# Patient Record
Sex: Male | Born: 1945
Health system: Southern US, Community
[De-identification: ages and names within clinical notes are randomized; demographics above are authoritative.]

## PROBLEM LIST (undated history)

## (undated) DIAGNOSIS — F329 Major depressive disorder, single episode, unspecified: Secondary | ICD-10-CM

## (undated) DIAGNOSIS — E1122 Type 2 diabetes mellitus with diabetic chronic kidney disease: Principal | ICD-10-CM

## (undated) DIAGNOSIS — J449 Chronic obstructive pulmonary disease, unspecified: Secondary | ICD-10-CM

## (undated) DIAGNOSIS — D631 Anemia in chronic kidney disease: Secondary | ICD-10-CM

## (undated) DIAGNOSIS — N400 Enlarged prostate without lower urinary tract symptoms: Secondary | ICD-10-CM

## (undated) DIAGNOSIS — E669 Obesity, unspecified: Secondary | ICD-10-CM

## (undated) DIAGNOSIS — I5021 Acute systolic (congestive) heart failure: Secondary | ICD-10-CM

## (undated) DIAGNOSIS — I739 Peripheral vascular disease, unspecified: Secondary | ICD-10-CM

## (undated) DIAGNOSIS — D649 Anemia, unspecified: Secondary | ICD-10-CM

## (undated) DIAGNOSIS — E1169 Type 2 diabetes mellitus with other specified complication: Secondary | ICD-10-CM

## (undated) DIAGNOSIS — E113213 Type 2 diabetes mellitus with mild nonproliferative diabetic retinopathy with macular edema, bilateral: Secondary | ICD-10-CM

## (undated) DIAGNOSIS — Z720 Tobacco use: Secondary | ICD-10-CM

## (undated) DIAGNOSIS — N521 Erectile dysfunction due to diseases classified elsewhere: Secondary | ICD-10-CM

## (undated) DIAGNOSIS — I10 Essential (primary) hypertension: Secondary | ICD-10-CM

## (undated) DIAGNOSIS — J189 Pneumonia, unspecified organism: Secondary | ICD-10-CM

## (undated) DIAGNOSIS — F1010 Alcohol abuse, uncomplicated: Secondary | ICD-10-CM

## (undated) DIAGNOSIS — D3502 Benign neoplasm of left adrenal gland: Secondary | ICD-10-CM

## (undated) DIAGNOSIS — D126 Benign neoplasm of colon, unspecified: Secondary | ICD-10-CM

## (undated) DIAGNOSIS — I82409 Acute embolism and thrombosis of unspecified deep veins of unspecified lower extremity: Secondary | ICD-10-CM

## (undated) DIAGNOSIS — N183 Chronic kidney disease, stage 3 (moderate): Principal | ICD-10-CM

## (undated) DIAGNOSIS — I5022 Chronic systolic (congestive) heart failure: Principal | ICD-10-CM

## (undated) DIAGNOSIS — J309 Allergic rhinitis, unspecified: Secondary | ICD-10-CM

## (undated) DIAGNOSIS — E785 Hyperlipidemia, unspecified: Secondary | ICD-10-CM

## (undated) DIAGNOSIS — N2581 Secondary hyperparathyroidism of renal origin: Secondary | ICD-10-CM

## (undated) DIAGNOSIS — I2510 Atherosclerotic heart disease of native coronary artery without angina pectoris: Secondary | ICD-10-CM

## (undated) DIAGNOSIS — N184 Chronic kidney disease, stage 4 (severe): Secondary | ICD-10-CM

## (undated) DIAGNOSIS — K5730 Diverticulosis of large intestine without perforation or abscess without bleeding: Secondary | ICD-10-CM

## (undated) DIAGNOSIS — I8290 Acute embolism and thrombosis of unspecified vein: Secondary | ICD-10-CM

## (undated) HISTORY — DX: Erectile dysfunction due to diseases classified elsewhere: N52.1

## (undated) HISTORY — DX: Type 2 diabetes mellitus with mild nonproliferative diabetic retinopathy with macular edema, bilateral: E11.3213

## (undated) HISTORY — DX: Type 2 diabetes mellitus with other specified complication: E11.69

## (undated) HISTORY — DX: Obesity, unspecified: E66.9

## (undated) HISTORY — DX: Alcohol abuse, uncomplicated: F10.10

## (undated) HISTORY — DX: Benign prostatic hyperplasia without lower urinary tract symptoms: N40.0

## (undated) HISTORY — DX: Diverticulosis of large intestine without perforation or abscess without bleeding: K57.30

## (undated) HISTORY — DX: Secondary hyperparathyroidism of renal origin: N25.81

## (undated) HISTORY — DX: Hyperlipidemia, unspecified: E78.5

## (undated) HISTORY — DX: Allergic rhinitis, unspecified: J30.9

## (undated) HISTORY — DX: Benign neoplasm of left adrenal gland: D35.02

## (undated) HISTORY — DX: Tobacco use: Z72.0

## (undated) HISTORY — DX: Chronic kidney disease, stage 4 (severe): N18.4

## (undated) HISTORY — DX: Peripheral vascular disease, unspecified: I73.9

## (undated) HISTORY — DX: Acute embolism and thrombosis of unspecified vein: I82.90

## (undated) HISTORY — DX: Type 2 diabetes mellitus with diabetic chronic kidney disease: E11.22

## (undated) HISTORY — DX: Chronic kidney disease, stage 3 (moderate): N18.3

## (undated) HISTORY — DX: Benign neoplasm of colon, unspecified: D12.6

## (undated) HISTORY — DX: Major depressive disorder, single episode, unspecified: F32.9

## (undated) HISTORY — DX: Chronic systolic (congestive) heart failure: I50.22

## (undated) HISTORY — DX: Anemia in chronic kidney disease: D63.1

## (undated) HISTORY — DX: Essential (primary) hypertension: I10

## (undated) HISTORY — DX: Anemia, unspecified: D64.9

---

## 1998-06-11 ENCOUNTER — Encounter: Admission: RE | Admit: 1998-06-11 | Discharge: 1998-06-11 | Payer: Self-pay | Admitting: Family Medicine

## 1998-07-16 ENCOUNTER — Encounter: Admission: RE | Admit: 1998-07-16 | Discharge: 1998-07-16 | Payer: Self-pay | Admitting: Family Medicine

## 1999-02-24 ENCOUNTER — Encounter: Admission: RE | Admit: 1999-02-24 | Discharge: 1999-02-24 | Payer: Self-pay | Admitting: Family Medicine

## 1999-03-18 ENCOUNTER — Encounter: Admission: RE | Admit: 1999-03-18 | Discharge: 1999-03-18 | Payer: Self-pay | Admitting: Family Medicine

## 1999-04-15 ENCOUNTER — Encounter: Admission: RE | Admit: 1999-04-15 | Discharge: 1999-04-15 | Payer: Self-pay | Admitting: Family Medicine

## 1999-05-19 ENCOUNTER — Encounter: Admission: RE | Admit: 1999-05-19 | Discharge: 1999-05-19 | Payer: Self-pay | Admitting: Family Medicine

## 1999-05-24 ENCOUNTER — Encounter: Admission: RE | Admit: 1999-05-24 | Discharge: 1999-05-24 | Payer: Self-pay | Admitting: Family Medicine

## 2000-04-24 ENCOUNTER — Encounter: Admission: RE | Admit: 2000-04-24 | Discharge: 2000-04-24 | Payer: Self-pay | Admitting: Family Medicine

## 2001-02-12 ENCOUNTER — Encounter: Admission: RE | Admit: 2001-02-12 | Discharge: 2001-02-12 | Payer: Self-pay | Admitting: Family Medicine

## 2001-04-02 ENCOUNTER — Encounter: Admission: RE | Admit: 2001-04-02 | Discharge: 2001-04-02 | Payer: Self-pay | Admitting: Family Medicine

## 2002-10-08 ENCOUNTER — Encounter: Payer: Self-pay | Admitting: Emergency Medicine

## 2002-10-08 ENCOUNTER — Emergency Department (HOSPITAL_COMMUNITY): Admission: EM | Admit: 2002-10-08 | Discharge: 2002-10-09 | Payer: Self-pay

## 2002-10-09 ENCOUNTER — Encounter: Payer: Self-pay | Admitting: Emergency Medicine

## 2002-10-22 ENCOUNTER — Encounter: Admission: RE | Admit: 2002-10-22 | Discharge: 2002-10-22 | Payer: Self-pay | Admitting: Family Medicine

## 2003-01-14 ENCOUNTER — Encounter: Admission: RE | Admit: 2003-01-14 | Discharge: 2003-01-14 | Payer: Self-pay | Admitting: *Deleted

## 2004-02-15 ENCOUNTER — Emergency Department (HOSPITAL_COMMUNITY): Admission: EM | Admit: 2004-02-15 | Discharge: 2004-02-15 | Payer: Self-pay | Admitting: Emergency Medicine

## 2004-02-17 ENCOUNTER — Emergency Department (HOSPITAL_COMMUNITY): Admission: EM | Admit: 2004-02-17 | Discharge: 2004-02-17 | Payer: Self-pay | Admitting: *Deleted

## 2004-03-15 ENCOUNTER — Emergency Department (HOSPITAL_COMMUNITY): Admission: EM | Admit: 2004-03-15 | Discharge: 2004-03-15 | Payer: Self-pay | Admitting: Family Medicine

## 2004-12-19 DIAGNOSIS — I82409 Acute embolism and thrombosis of unspecified deep veins of unspecified lower extremity: Secondary | ICD-10-CM

## 2004-12-19 HISTORY — DX: Acute embolism and thrombosis of unspecified deep veins of unspecified lower extremity: I82.409

## 2005-07-19 HISTORY — PX: CARDIAC CATHETERIZATION: SHX172

## 2005-07-26 ENCOUNTER — Inpatient Hospital Stay (HOSPITAL_COMMUNITY): Admission: EM | Admit: 2005-07-26 | Discharge: 2005-08-06 | Payer: Self-pay | Admitting: Emergency Medicine

## 2005-07-28 ENCOUNTER — Encounter (INDEPENDENT_AMBULATORY_CARE_PROVIDER_SITE_OTHER): Payer: Self-pay | Admitting: Cardiovascular Disease

## 2005-08-01 HISTORY — PX: CORONARY ARTERY BYPASS GRAFT: SHX141

## 2005-08-25 ENCOUNTER — Encounter (HOSPITAL_COMMUNITY): Admission: RE | Admit: 2005-08-25 | Discharge: 2005-11-23 | Payer: Self-pay | Admitting: Cardiovascular Disease

## 2005-08-26 ENCOUNTER — Encounter: Admission: RE | Admit: 2005-08-26 | Discharge: 2005-08-26 | Payer: Self-pay | Admitting: Cardiothoracic Surgery

## 2005-11-24 ENCOUNTER — Encounter (HOSPITAL_COMMUNITY): Admission: RE | Admit: 2005-11-24 | Discharge: 2005-12-16 | Payer: Self-pay | Admitting: Cardiovascular Disease

## 2005-12-16 ENCOUNTER — Encounter: Admission: RE | Admit: 2005-12-16 | Discharge: 2005-12-16 | Payer: Self-pay | Admitting: Cardiothoracic Surgery

## 2006-06-24 ENCOUNTER — Emergency Department (HOSPITAL_COMMUNITY): Admission: EM | Admit: 2006-06-24 | Discharge: 2006-06-24 | Payer: Self-pay | Admitting: Family Medicine

## 2006-06-26 ENCOUNTER — Emergency Department (HOSPITAL_COMMUNITY): Admission: EM | Admit: 2006-06-26 | Discharge: 2006-06-26 | Payer: Self-pay | Admitting: Emergency Medicine

## 2006-10-16 ENCOUNTER — Emergency Department (HOSPITAL_COMMUNITY): Admission: EM | Admit: 2006-10-16 | Discharge: 2006-10-16 | Payer: Self-pay | Admitting: Family Medicine

## 2007-02-15 DIAGNOSIS — F1010 Alcohol abuse, uncomplicated: Secondary | ICD-10-CM

## 2007-02-15 DIAGNOSIS — Z720 Tobacco use: Secondary | ICD-10-CM

## 2007-02-15 DIAGNOSIS — E66811 Obesity, class 1: Secondary | ICD-10-CM

## 2007-02-15 DIAGNOSIS — I10 Essential (primary) hypertension: Secondary | ICD-10-CM | POA: Insufficient documentation

## 2007-02-15 DIAGNOSIS — N183 Chronic kidney disease, stage 3 unspecified: Secondary | ICD-10-CM

## 2007-02-15 DIAGNOSIS — E1169 Type 2 diabetes mellitus with other specified complication: Secondary | ICD-10-CM

## 2007-02-15 DIAGNOSIS — E785 Hyperlipidemia, unspecified: Secondary | ICD-10-CM

## 2007-02-15 DIAGNOSIS — E1122 Type 2 diabetes mellitus with diabetic chronic kidney disease: Secondary | ICD-10-CM

## 2007-02-15 DIAGNOSIS — N521 Erectile dysfunction due to diseases classified elsewhere: Secondary | ICD-10-CM

## 2007-02-15 DIAGNOSIS — N185 Chronic kidney disease, stage 5: Secondary | ICD-10-CM

## 2007-02-15 DIAGNOSIS — I2510 Atherosclerotic heart disease of native coronary artery without angina pectoris: Secondary | ICD-10-CM

## 2007-02-15 DIAGNOSIS — E669 Obesity, unspecified: Secondary | ICD-10-CM

## 2007-02-15 DIAGNOSIS — I25719 Atherosclerosis of autologous vein coronary artery bypass graft(s) with unspecified angina pectoris: Secondary | ICD-10-CM

## 2007-02-15 HISTORY — DX: Erectile dysfunction due to diseases classified elsewhere: N52.1

## 2007-02-15 HISTORY — DX: Chronic kidney disease, stage 3 unspecified: N18.30

## 2007-02-15 HISTORY — DX: Atherosclerotic heart disease of native coronary artery without angina pectoris: I25.10

## 2007-02-15 HISTORY — DX: Hyperlipidemia, unspecified: E78.5

## 2007-02-15 HISTORY — DX: Obesity, unspecified: E66.9

## 2007-02-15 HISTORY — DX: Essential (primary) hypertension: I10

## 2007-02-15 HISTORY — DX: Type 2 diabetes mellitus with diabetic chronic kidney disease: E11.22

## 2007-02-15 HISTORY — DX: Type 2 diabetes mellitus with other specified complication: E11.69

## 2007-02-15 HISTORY — DX: Tobacco use: Z72.0

## 2007-02-15 HISTORY — DX: Obesity, class 1: E66.811

## 2007-02-15 HISTORY — DX: Alcohol abuse, uncomplicated: F10.10

## 2007-04-16 ENCOUNTER — Emergency Department (HOSPITAL_COMMUNITY): Admission: EM | Admit: 2007-04-16 | Discharge: 2007-04-16 | Payer: Self-pay | Admitting: Family Medicine

## 2007-08-21 ENCOUNTER — Emergency Department (HOSPITAL_COMMUNITY): Admission: EM | Admit: 2007-08-21 | Discharge: 2007-08-21 | Payer: Self-pay | Admitting: Emergency Medicine

## 2008-09-11 ENCOUNTER — Emergency Department (HOSPITAL_COMMUNITY): Admission: EM | Admit: 2008-09-11 | Discharge: 2008-09-11 | Payer: Self-pay | Admitting: Emergency Medicine

## 2009-05-25 ENCOUNTER — Inpatient Hospital Stay (HOSPITAL_COMMUNITY): Admission: EM | Admit: 2009-05-25 | Discharge: 2009-05-26 | Payer: Self-pay | Admitting: Emergency Medicine

## 2010-02-06 ENCOUNTER — Inpatient Hospital Stay (HOSPITAL_COMMUNITY): Admission: EM | Admit: 2010-02-06 | Discharge: 2010-02-10 | Payer: Self-pay | Admitting: Emergency Medicine

## 2010-09-05 ENCOUNTER — Emergency Department (HOSPITAL_COMMUNITY): Admission: EM | Admit: 2010-09-05 | Discharge: 2010-09-05 | Payer: Self-pay | Admitting: Emergency Medicine

## 2011-01-08 ENCOUNTER — Encounter: Payer: Self-pay | Admitting: Cardiothoracic Surgery

## 2011-01-09 ENCOUNTER — Emergency Department (HOSPITAL_COMMUNITY)
Admission: EM | Admit: 2011-01-09 | Discharge: 2011-01-09 | Payer: Self-pay | Source: Home / Self Care | Admitting: Emergency Medicine

## 2011-01-09 ENCOUNTER — Encounter: Payer: Self-pay | Admitting: Cardiothoracic Surgery

## 2011-01-11 LAB — DIFFERENTIAL
Basophils Absolute: 0 10*3/uL (ref 0.0–0.1)
Basophils Relative: 0 % (ref 0–1)
Eosinophils Absolute: 0 10*3/uL (ref 0.0–0.7)
Eosinophils Relative: 0 % (ref 0–5)
Lymphocytes Relative: 43 % (ref 12–46)
Lymphs Abs: 2.5 10*3/uL (ref 0.7–4.0)
Monocytes Absolute: 0.3 10*3/uL (ref 0.1–1.0)
Monocytes Relative: 6 % (ref 3–12)
Neutro Abs: 3 K/uL (ref 1.7–7.7)
Neutrophils Relative %: 51 % (ref 43–77)

## 2011-01-11 LAB — BASIC METABOLIC PANEL WITH GFR
BUN: 18 mg/dL (ref 6–23)
Calcium: 8.7 mg/dL (ref 8.4–10.5)
Chloride: 103 meq/L (ref 96–112)
GFR calc Af Amer: >60 mL/min (ref >60)
Glucose, Bld: 206 mg/dL — ABNORMAL HIGH (ref 70–99)
Potassium: 3.4 meq/L — ABNORMAL LOW (ref 3.5–5.1)
Sodium: 139 meq/L (ref 135–145)

## 2011-01-11 LAB — ETHANOL: Alcohol, Ethyl (B): 269 mg/dL — ABNORMAL HIGH (ref 0–10)

## 2011-01-11 LAB — CBC
HCT: 39.9 % (ref 39.0–52.0)
Hemoglobin: 13.3 g/dL (ref 13.0–17.0)
MCH: 29.1 pg (ref 26.0–34.0)
MCHC: 33.3 g/dL (ref 30.0–36.0)
MCV: 87.3 fL (ref 78.0–100.0)
Platelets: 276 K/uL (ref 150–400)
RBC: 4.57 MIL/uL (ref 4.22–5.81)
RDW: 13.9 % (ref 11.5–15.5)
WBC: 5.8 K/uL (ref 4.0–10.5)

## 2011-01-11 LAB — POCT CARDIAC MARKERS
CKMB, poc: 1.4 ng/mL (ref 1.0–8.0)
CKMB, poc: 1.5 ng/mL (ref 1.0–8.0)
CKMB, poc: <1 ng/mL — ABNORMAL LOW (ref 1.0–8.0)
Myoglobin, poc: 279 ng/mL (ref 12–200)
Myoglobin, poc: 173 ng/mL (ref 12–200)
Myoglobin, poc: 46.8 ng/mL (ref 12–200)
Troponin i, poc: <0.05 ng/mL (ref 0.00–0.09)
Troponin i, poc: <0.05 ng/mL (ref 0.00–0.09)
Troponin i, poc: <0.05 ng/mL (ref 0.00–0.09)

## 2011-01-11 LAB — BASIC METABOLIC PANEL
CO2: 24 mEq/L (ref 19–32)
Creatinine, Ser: 1.17 mg/dL (ref 0.4–1.5)
GFR calc non Af Amer: >60 mL/min (ref >60)

## 2011-02-09 ENCOUNTER — Inpatient Hospital Stay (HOSPITAL_COMMUNITY)
Admission: EM | Admit: 2011-02-09 | Discharge: 2011-02-21 | DRG: 193 | Disposition: A | Discharge status: Home / Self Care | Payer: Medicare HMO | Attending: Cardiovascular Disease | Admitting: Cardiovascular Disease

## 2011-02-09 ENCOUNTER — Emergency Department (HOSPITAL_COMMUNITY): Payer: Medicare HMO

## 2011-02-09 DIAGNOSIS — J189 Pneumonia, unspecified organism: Principal | ICD-10-CM | POA: Diagnosis present

## 2011-02-09 DIAGNOSIS — E119 Type 2 diabetes mellitus without complications: Secondary | ICD-10-CM | POA: Diagnosis present

## 2011-02-09 DIAGNOSIS — I2699 Other pulmonary embolism without acute cor pulmonale: Secondary | ICD-10-CM | POA: Diagnosis present

## 2011-02-09 DIAGNOSIS — I10 Essential (primary) hypertension: Secondary | ICD-10-CM | POA: Diagnosis present

## 2011-02-09 DIAGNOSIS — E669 Obesity, unspecified: Secondary | ICD-10-CM | POA: Diagnosis present

## 2011-02-09 DIAGNOSIS — E785 Hyperlipidemia, unspecified: Secondary | ICD-10-CM | POA: Diagnosis present

## 2011-02-09 DIAGNOSIS — I824Z9 Acute embolism and thrombosis of unspecified deep veins of unspecified distal lower extremity: Secondary | ICD-10-CM | POA: Diagnosis present

## 2011-02-09 LAB — COMPREHENSIVE METABOLIC PANEL
ALT: 12 U/L (ref 0–53)
Albumin: 2.7 g/dL — ABNORMAL LOW (ref 3.5–5.2)
Alkaline Phosphatase: 81 U/L (ref 39–117)
Calcium: 9.4 mg/dL (ref 8.4–10.5)
Chloride: 97 mEq/L (ref 96–112)
Creatinine, Ser: 1.07 mg/dL (ref 0.4–1.5)
GFR calc Af Amer: >60 mL/min (ref >60)
Potassium: 3.6 mEq/L (ref 3.5–5.1)
Sodium: 139 mEq/L (ref 135–145)

## 2011-02-09 LAB — URINALYSIS, ROUTINE W REFLEX MICROSCOPIC
Ketones, ur: 15 mg/dL — AB
Protein, ur: >300 mg/dL — AB
Specific Gravity, Urine: 1.025 (ref 1.005–1.030)
Urine Glucose, Fasting: NEGATIVE mg/dL

## 2011-02-09 LAB — CBC
MCHC: 34 g/dL (ref 30.0–36.0)
MCV: 88 fL (ref 78.0–100.0)
RDW: 13.9 % (ref 11.5–15.5)
WBC: 7.7 10*3/uL (ref 4.0–10.5)

## 2011-02-09 LAB — URINE MICROSCOPIC-ADD ON

## 2011-02-09 LAB — DIFFERENTIAL
Basophils Absolute: 0.1 10*3/uL (ref 0.0–0.1)
Basophils Relative: 1 % (ref 0–1)
Eosinophils Absolute: 0 10*3/uL (ref 0.0–0.7)
Lymphocytes Relative: 24 % (ref 12–46)
Neutro Abs: 5.1 10*3/uL (ref 1.7–7.7)

## 2011-02-10 LAB — GLUCOSE, CAPILLARY
Glucose-Capillary: 201 mg/dL — ABNORMAL HIGH (ref 70–99)
Glucose-Capillary: 145 mg/dL — ABNORMAL HIGH (ref 70–99)
Glucose-Capillary: 217 mg/dL — ABNORMAL HIGH (ref 70–99)

## 2011-02-11 LAB — BASIC METABOLIC PANEL
GFR calc non Af Amer: >60 mL/min (ref >60)
Potassium: 3.6 mEq/L (ref 3.5–5.1)
Sodium: 139 mEq/L (ref 135–145)

## 2011-02-11 LAB — GLUCOSE, CAPILLARY
Glucose-Capillary: 183 mg/dL — ABNORMAL HIGH (ref 70–99)
Glucose-Capillary: 208 mg/dL — ABNORMAL HIGH (ref 70–99)

## 2011-02-11 LAB — LIPID PANEL
Cholesterol: 140 mg/dL (ref 0–200)
HDL: 26 mg/dL — ABNORMAL LOW (ref >39)
LDL Cholesterol: 106 mg/dL — ABNORMAL HIGH (ref 0–99)
Triglycerides: 39 mg/dL (ref <150)

## 2011-02-11 LAB — URINE CULTURE

## 2011-02-12 ENCOUNTER — Inpatient Hospital Stay (HOSPITAL_COMMUNITY): Payer: Medicare HMO

## 2011-02-12 LAB — GLUCOSE, CAPILLARY: Glucose-Capillary: 183 mg/dL — ABNORMAL HIGH (ref 70–99)

## 2011-02-12 LAB — PROTIME-INR: INR: 0.95 (ref 0.00–1.49)

## 2011-02-12 MED ORDER — IOHEXOL 300 MG/ML  SOLN: 100.0000 mL | Freq: Once | INTRAMUSCULAR | Status: AC | PRN

## 2011-02-13 LAB — HEPARIN LEVEL (UNFRACTIONATED): Heparin Unfractionated: <0.1 IU/mL — ABNORMAL LOW (ref 0.30–0.70)

## 2011-02-13 LAB — CBC
HCT: 37.8 % — ABNORMAL LOW (ref 39.0–52.0)
Hemoglobin: 11.6 g/dL — ABNORMAL LOW (ref 13.0–17.0)
Hemoglobin: 12.5 g/dL — ABNORMAL LOW (ref 13.0–17.0)
MCH: 29.2 pg (ref 26.0–34.0)
MCHC: 33.1 g/dL (ref 30.0–36.0)
MCHC: 33.5 g/dL (ref 30.0–36.0)
Platelets: 508 10*3/uL — ABNORMAL HIGH (ref 150–400)
Platelets: 601 10*3/uL — ABNORMAL HIGH (ref 150–400)
RBC: 4.28 MIL/uL (ref 4.22–5.81)
RDW: 13.7 % (ref 11.5–15.5)
RDW: 13.8 % (ref 11.5–15.5)

## 2011-02-13 LAB — PROTIME-INR: Prothrombin Time: 12.9 seconds (ref 11.6–15.2)

## 2011-02-13 LAB — GLUCOSE, CAPILLARY
Glucose-Capillary: 127 mg/dL — ABNORMAL HIGH (ref 70–99)
Glucose-Capillary: 161 mg/dL — ABNORMAL HIGH (ref 70–99)
Glucose-Capillary: 252 mg/dL — ABNORMAL HIGH (ref 70–99)

## 2011-02-13 LAB — BRAIN NATRIURETIC PEPTIDE: Pro B Natriuretic peptide (BNP): 187 pg/mL — ABNORMAL HIGH (ref 0.0–100.0)

## 2011-02-14 DIAGNOSIS — I2699 Other pulmonary embolism without acute cor pulmonale: Secondary | ICD-10-CM

## 2011-02-14 LAB — HEPARIN LEVEL (UNFRACTIONATED)
Heparin Unfractionated: 0.2 IU/mL — ABNORMAL LOW (ref 0.30–0.70)
Heparin Unfractionated: 0.35 IU/mL (ref 0.30–0.70)
Heparin Unfractionated: 0.79 IU/mL — ABNORMAL HIGH (ref 0.30–0.70)

## 2011-02-14 LAB — GLUCOSE, CAPILLARY: Glucose-Capillary: 147 mg/dL — ABNORMAL HIGH (ref 70–99)

## 2011-02-15 LAB — PROTIME-INR
INR: 1.18 (ref 0.00–1.49)
Prothrombin Time: 15.2 seconds (ref 11.6–15.2)

## 2011-02-15 LAB — CBC
HCT: 38 % — ABNORMAL LOW (ref 39.0–52.0)
Hemoglobin: 12.7 g/dL — ABNORMAL LOW (ref 13.0–17.0)
MCHC: 33.4 g/dL (ref 30.0–36.0)

## 2011-02-15 LAB — GLUCOSE, CAPILLARY: Glucose-Capillary: 162 mg/dL — ABNORMAL HIGH (ref 70–99)

## 2011-02-16 LAB — CBC
HCT: 34.8 % — ABNORMAL LOW (ref 39.0–52.0)
Hemoglobin: 11.3 g/dL — ABNORMAL LOW (ref 13.0–17.0)
MCH: 28.4 pg (ref 26.0–34.0)
MCHC: 32.5 g/dL (ref 30.0–36.0)

## 2011-02-16 LAB — GLUCOSE, CAPILLARY
Glucose-Capillary: 120 mg/dL — ABNORMAL HIGH (ref 70–99)
Glucose-Capillary: 178 mg/dL — ABNORMAL HIGH (ref 70–99)

## 2011-02-17 LAB — GLUCOSE, CAPILLARY
Glucose-Capillary: 149 mg/dL — ABNORMAL HIGH (ref 70–99)
Glucose-Capillary: 202 mg/dL — ABNORMAL HIGH (ref 70–99)

## 2011-02-17 LAB — HEPARIN LEVEL (UNFRACTIONATED): Heparin Unfractionated: 0.64 IU/mL (ref 0.30–0.70)

## 2011-02-17 LAB — BASIC METABOLIC PANEL
Calcium: 9.3 mg/dL (ref 8.4–10.5)
Creatinine, Ser: 1.35 mg/dL (ref 0.4–1.5)
GFR calc Af Amer: >60 mL/min (ref >60)

## 2011-02-17 LAB — CBC
Platelets: 481 10*3/uL — ABNORMAL HIGH (ref 150–400)
RBC: 3.93 MIL/uL — ABNORMAL LOW (ref 4.22–5.81)
WBC: 5.9 10*3/uL (ref 4.0–10.5)

## 2011-02-17 LAB — PROTIME-INR
INR: 1.69 — ABNORMAL HIGH (ref 0.00–1.49)
Prothrombin Time: 20.1 seconds — ABNORMAL HIGH (ref 11.6–15.2)

## 2011-02-18 LAB — CBC
HCT: 31.7 % — ABNORMAL LOW (ref 39.0–52.0)
Hemoglobin: 10.3 g/dL — ABNORMAL LOW (ref 13.0–17.0)
RDW: 14 % (ref 11.5–15.5)
WBC: 5.5 10*3/uL (ref 4.0–10.5)

## 2011-02-18 LAB — GLUCOSE, CAPILLARY: Glucose-Capillary: 148 mg/dL — ABNORMAL HIGH (ref 70–99)

## 2011-02-18 LAB — PROTIME-INR
INR: 1.69 — ABNORMAL HIGH (ref 0.00–1.49)
Prothrombin Time: 20.1 seconds — ABNORMAL HIGH (ref 11.6–15.2)

## 2011-02-19 LAB — BASIC METABOLIC PANEL
BUN: 19 mg/dL (ref 6–23)
Calcium: 9 mg/dL (ref 8.4–10.5)
Creatinine, Ser: 1.32 mg/dL (ref 0.4–1.5)
GFR calc non Af Amer: 54 mL/min — ABNORMAL LOW (ref >60)
Glucose, Bld: 171 mg/dL — ABNORMAL HIGH (ref 70–99)
Sodium: 137 mEq/L (ref 135–145)

## 2011-02-19 LAB — CBC
Hemoglobin: 10.6 g/dL — ABNORMAL LOW (ref 13.0–17.0)
MCHC: 32.6 g/dL (ref 30.0–36.0)
Platelets: 407 10*3/uL — ABNORMAL HIGH (ref 150–400)
RDW: 14 % (ref 11.5–15.5)

## 2011-02-19 LAB — HEPARIN LEVEL (UNFRACTIONATED)
Heparin Unfractionated: 0.77 IU/mL — ABNORMAL HIGH (ref 0.30–0.70)
Heparin Unfractionated: 0.56 IU/mL (ref 0.30–0.70)

## 2011-02-19 LAB — GLUCOSE, CAPILLARY: Glucose-Capillary: 108 mg/dL — ABNORMAL HIGH (ref 70–99)

## 2011-02-19 LAB — PROTIME-INR: Prothrombin Time: 20.3 seconds — ABNORMAL HIGH (ref 11.6–15.2)

## 2011-02-20 LAB — GLUCOSE, CAPILLARY: Glucose-Capillary: 122 mg/dL — ABNORMAL HIGH (ref 70–99)

## 2011-02-20 LAB — HEPARIN LEVEL (UNFRACTIONATED): Heparin Unfractionated: 0.65 IU/mL (ref 0.30–0.70)

## 2011-02-20 LAB — CBC
HCT: 31.8 % — ABNORMAL LOW (ref 39.0–52.0)
Hemoglobin: 10.5 g/dL — ABNORMAL LOW (ref 13.0–17.0)
MCHC: 33 g/dL (ref 30.0–36.0)
MCV: 87.6 fL (ref 78.0–100.0)
RDW: 14.1 % (ref 11.5–15.5)
WBC: 5.2 10*3/uL (ref 4.0–10.5)

## 2011-02-21 LAB — COMPREHENSIVE METABOLIC PANEL
ALT: 38 U/L (ref 0–53)
AST: 30 U/L (ref 0–37)
Albumin: 2.9 g/dL — ABNORMAL LOW (ref 3.5–5.2)
Alkaline Phosphatase: 78 U/L (ref 39–117)
BUN: 14 mg/dL (ref 6–23)
CO2: 30 mEq/L (ref 19–32)
Calcium: 9.1 mg/dL (ref 8.4–10.5)
Chloride: 101 mEq/L (ref 96–112)
Creatinine, Ser: 1.17 mg/dL (ref 0.4–1.5)
GFR calc Af Amer: >60 mL/min (ref >60)
GFR calc non Af Amer: >60 mL/min (ref >60)
Glucose, Bld: 111 mg/dL — ABNORMAL HIGH (ref 70–99)
Potassium: 4.7 mEq/L (ref 3.5–5.1)
Sodium: 136 mEq/L (ref 135–145)
Total Bilirubin: 0.2 mg/dL — ABNORMAL LOW (ref 0.3–1.2)
Total Protein: 7 g/dL (ref 6.0–8.3)

## 2011-02-21 LAB — PROTIME-INR
INR: 2.73 — ABNORMAL HIGH (ref 0.00–1.49)
Prothrombin Time: 29 seconds — ABNORMAL HIGH (ref 11.6–15.2)

## 2011-02-21 LAB — CBC
HCT: 32.8 % — ABNORMAL LOW (ref 39.0–52.0)
Hemoglobin: 10.7 g/dL — ABNORMAL LOW (ref 13.0–17.0)
MCH: 28.5 pg (ref 26.0–34.0)
MCHC: 32.6 g/dL (ref 30.0–36.0)
MCV: 87.2 fL (ref 78.0–100.0)
Platelets: 372 10*3/uL (ref 150–400)
RBC: 3.76 MIL/uL — ABNORMAL LOW (ref 4.22–5.81)
RDW: 14.1 % (ref 11.5–15.5)
WBC: 5.8 10*3/uL (ref 4.0–10.5)

## 2011-02-21 LAB — HEPARIN LEVEL (UNFRACTIONATED): Heparin Unfractionated: 0.94 IU/mL — ABNORMAL HIGH (ref 0.30–0.70)

## 2011-02-21 LAB — GLUCOSE, CAPILLARY: Glucose-Capillary: 106 mg/dL — ABNORMAL HIGH (ref 70–99)

## 2011-03-03 LAB — CBC
HCT: 41.4 % (ref 39.0–52.0)
MCV: 89.1 fL (ref 78.0–100.0)
RBC: 4.65 MIL/uL (ref 4.22–5.81)
RDW: 15.1 % (ref 11.5–15.5)
WBC: 5.8 10*3/uL (ref 4.0–10.5)

## 2011-03-03 LAB — COMPREHENSIVE METABOLIC PANEL
ALT: 15 U/L (ref 0–53)
Albumin: 3.7 g/dL (ref 3.5–5.2)
Alkaline Phosphatase: 116 U/L (ref 39–117)
BUN: 14 mg/dL (ref 6–23)
Chloride: 101 mEq/L (ref 96–112)
Glucose, Bld: 302 mg/dL — ABNORMAL HIGH (ref 70–99)
Potassium: 3.7 mEq/L (ref 3.5–5.1)
Sodium: 135 mEq/L (ref 135–145)
Total Bilirubin: 0.6 mg/dL (ref 0.3–1.2)
Total Protein: 7.4 g/dL (ref 6.0–8.3)

## 2011-03-03 LAB — POCT CARDIAC MARKERS
Myoglobin, poc: 49.8 ng/mL (ref 12–200)
Myoglobin, poc: 68.4 ng/mL (ref 12–200)

## 2011-03-03 LAB — URINALYSIS, ROUTINE W REFLEX MICROSCOPIC
Bilirubin Urine: NEGATIVE
Ketones, ur: NEGATIVE mg/dL
Leukocytes, UA: NEGATIVE
Nitrite: NEGATIVE
Protein, ur: >300 mg/dL — AB
Urobilinogen, UA: 0.2 mg/dL (ref 0.0–1.0)

## 2011-03-03 LAB — GLUCOSE, CAPILLARY
Glucose-Capillary: 290 mg/dL — ABNORMAL HIGH (ref 70–99)
Glucose-Capillary: 297 mg/dL — ABNORMAL HIGH (ref 70–99)
Glucose-Capillary: 333 mg/dL — ABNORMAL HIGH (ref 70–99)
Glucose-Capillary: 367 mg/dL — ABNORMAL HIGH (ref 70–99)

## 2011-03-03 LAB — DIFFERENTIAL
Basophils Absolute: 0 10*3/uL (ref 0.0–0.1)
Basophils Relative: 1 % (ref 0–1)
Eosinophils Absolute: 0 10*3/uL (ref 0.0–0.7)
Monocytes Absolute: 0.1 10*3/uL (ref 0.1–1.0)
Monocytes Relative: 2 % — ABNORMAL LOW (ref 3–12)

## 2011-03-09 LAB — BASIC METABOLIC PANEL
BUN: 11 mg/dL (ref 6–23)
CO2: 27 mEq/L (ref 19–32)
CO2: 27 mEq/L (ref 19–32)
Chloride: 95 mEq/L — ABNORMAL LOW (ref 96–112)
Chloride: 98 mEq/L (ref 96–112)
GFR calc Af Amer: >60 mL/min (ref >60)
GFR calc non Af Amer: >60 mL/min (ref >60)
Glucose, Bld: 233 mg/dL — ABNORMAL HIGH (ref 70–99)
Potassium: 3.3 mEq/L — ABNORMAL LOW (ref 3.5–5.1)
Sodium: 133 mEq/L — ABNORMAL LOW (ref 135–145)

## 2011-03-09 LAB — GLUCOSE, CAPILLARY
Glucose-Capillary: 128 mg/dL — ABNORMAL HIGH (ref 70–99)
Glucose-Capillary: 155 mg/dL — ABNORMAL HIGH (ref 70–99)
Glucose-Capillary: 173 mg/dL — ABNORMAL HIGH (ref 70–99)
Glucose-Capillary: 198 mg/dL — ABNORMAL HIGH (ref 70–99)
Glucose-Capillary: 235 mg/dL — ABNORMAL HIGH (ref 70–99)
Glucose-Capillary: 237 mg/dL — ABNORMAL HIGH (ref 70–99)

## 2011-03-09 LAB — CBC
HCT: 35.8 % — ABNORMAL LOW (ref 39.0–52.0)
Hemoglobin: 13.9 g/dL (ref 13.0–17.0)
MCHC: 34.7 g/dL (ref 30.0–36.0)
MCHC: 35.1 g/dL (ref 30.0–36.0)
MCV: 91.4 fL (ref 78.0–100.0)
MCV: 92.6 fL (ref 78.0–100.0)
Platelets: 178 10*3/uL (ref 150–400)
RBC: 4.34 MIL/uL (ref 4.22–5.81)
RDW: 12.6 % (ref 11.5–15.5)

## 2011-03-09 LAB — DIFFERENTIAL
Basophils Absolute: 0 10*3/uL (ref 0.0–0.1)
Basophils Relative: 0 % (ref 0–1)
Basophils Relative: 1 % (ref 0–1)
Eosinophils Absolute: 0 10*3/uL (ref 0.0–0.7)
Eosinophils Absolute: 0 10*3/uL (ref 0.0–0.7)
Eosinophils Relative: 0 % (ref 0–5)
Eosinophils Relative: 0 % (ref 0–5)
Monocytes Absolute: 0.7 10*3/uL (ref 0.1–1.0)
Monocytes Relative: 11 % (ref 3–12)

## 2011-03-09 LAB — URINE CULTURE: Colony Count: NO GROWTH

## 2011-03-09 LAB — COMPREHENSIVE METABOLIC PANEL
ALT: 15 U/L (ref 0–53)
Albumin: 2.9 g/dL — ABNORMAL LOW (ref 3.5–5.2)
Alkaline Phosphatase: 80 U/L (ref 39–117)
Calcium: 8.2 mg/dL — ABNORMAL LOW (ref 8.4–10.5)
Potassium: 4.1 mEq/L (ref 3.5–5.1)
Sodium: 129 mEq/L — ABNORMAL LOW (ref 135–145)
Total Protein: 6.2 g/dL (ref 6.0–8.3)

## 2011-03-09 LAB — CULTURE, BLOOD (ROUTINE X 2)
Culture: NO GROWTH
Culture: NO GROWTH

## 2011-03-09 LAB — URINALYSIS, ROUTINE W REFLEX MICROSCOPIC
Glucose, UA: 250 mg/dL — AB
Ketones, ur: NEGATIVE mg/dL
Protein, ur: >300 mg/dL — AB

## 2011-03-09 LAB — URINE MICROSCOPIC-ADD ON

## 2011-03-09 LAB — HEMOGLOBIN A1C: Hgb A1c MFr Bld: 9.9 % — ABNORMAL HIGH (ref 4.6–6.1)

## 2011-03-12 NOTE — Discharge Summary (Signed)
Kevin Castillo, Kevin Castillo             ACCOUNT NO.:  [id number]  MEDICAL RECORD NO.:  [id number]           PATIENT TYPE:  I  LOCATION:  2008                         FACILITY:  [facility]  PHYSICIAN:  Birdie Riddle, M.D.  DATE OF BIRTH:  1946/06/25  DATE OF ADMISSION:  02/09/2011 DATE OF DISCHARGE:  02/21/2011                              DISCHARGE SUMMARY   FINAL DIAGNOSES: 1. Right lower lung pneumonia. 2. Hypertension. 3. Diabetes mellitus type 2. 4. Bilateral deep venous thrombosis. 5. Obesity. 6. Erectile dysfunction. 7. Chronic obstructive pulmonary disease.  DISCHARGE MEDICATIONS: 1. Albuterol 90 mcg inhaler 2 puffs 4 times daily as needed. 2. Doxazosin 8 mg tablets one at bedtime. 3. Moxifloxacin 400 mg one daily for 5 days. 4. Nicotine 21 mg per 24-hour patch, apply daily for 2-4 weeks. 5. Potassium chloride 10 mEq daily. 6. Warfarin 7.5 mg tablet one daily. 7. Amlodipine 10 mg half a tablet daily. 8. Acetaminophen 325 mg 2 tablets every 6 hours as needed. 9. Cyclobenzaprine 10 mg 1 tablet at bedtime. 10.Olmesartan/hydrochlorothiazide 300/25 mg 1 tablet daily. 11.Metformin XL 1000 mg one twice daily. 12.Nitroglycerin sublingual 0.4 mg tablet one under the tongue every 5     minutes x3 doses as needed. 13.Toprol-XL 50 mg one daily. 14.Tramadol 50 mg one in the evening as needed. 15.Vytorin 10/10 mg 1 tablet daily.  The patient to discontinue taking aspirin.  DISCHARGE DIET:  Low-sodium, heart-healthy diet and carbohydrate- modified diet.  DISCHARGE ACTIVITY:  The patient to increase activity gradually as tolerated.  Followup by Dr. Dixie Dials in 4 days.  The patient to also get INR checked at that time in the office.  CONDITION ON DISCHARGE:  Improved.  HISTORY:  This 65 year old black male presented with abdominal discomfort, nausea without fever, also had right-sided chest pain and cough.  His past medical history positive for diabetes,  hypertension, dyslipidemia, obesity, erectile dysfunction, and COPD.  PHYSICAL EXAMINATION:  VITAL SIGNS:  Pulse 90, respirations 19, blood pressure 170/67, temperature 98.2. HEENT:  The patient is normocephalic, atraumatic with brown eyes. Conjunctivae pink.  Sclerae are muddy. NECK:  No JVD. LUNGS:  Right lower lung crackles.  Negative wheezing. HEART:  Normal S1 and S2. ABDOMEN:  Soft, distended, and nontender. EXTREMITIES:  Trace edema, positive clubbing. CNS:  The patient moves all four extremities.  He is alert and oriented x3.  LABORATORY DATA:  Normal hemoglobin, hematocrit, WBC count, and platelet count.  Normal INR of 0.95.  Subsequent INR on February 21, 2011, was 2.73. Electrolytes normal.  Glucose slightly high at 117, BUN 14, creatinine 1.07, albumin low at 2.7.  Rest of the liver function test unremarkable. B-natriuretic peptide slightly high at 187.  Cholesterol total 140, triglycerides 39, LDL cholesterol slightly high at 106, HDL cholesterol low at 26.  Urinalysis showed protein more than 300 with granular cast and cloudy appearance.  Urine culture showed no growth.  EKG showed sinus rhythm with septal infarct.  Bilateral lower extremity venous duplex evaluation showed acute deep venous thrombosis involving the right lower extremity.  CT of the abdomen without contrast showed right lower lobe pneumonia with small effusion.  CT angiography showed mild subacute to chronic right-sided pulmonary emboli, moderate right pleural effusion, 13-mm pulmonary nodule without evidence of lymphadenopathy.  Ultrasound of the abdomen showed normal gallbladder with a left renal cyst.  HOSPITAL COURSE:  The patient was admitted to telemetry unit.  He was started on IV antibiotic.  He had abdomen evaluation with the CT and ultrasound of the abdomen.  On February 14, 2011, he underwent lower extremity duplex evaluation that showed right leg DVT.  He was started on IV heparin and  Coumadin.  His condition improved gradually over the next 4-5 days.  His Norvasc dose was decreased by 50% to resume Cardura for his prostate symptoms of urinary hesitancy.  On February 21, 2011, he was discharged home in satisfactory condition with followup by me in 4-5 days with Dr. Dixie Dials.     Birdie Riddle, M.D.     ASK/MEDQ  D:  03/09/2011  T:  03/10/2011  Job:  [id number]  Electronically Signed by Dixie Dials M.D. on 03/12/2011 09:22:03 PM

## 2011-03-28 LAB — GLUCOSE, CAPILLARY
Glucose-Capillary: 102 mg/dL — ABNORMAL HIGH (ref 70–99)
Glucose-Capillary: 139 mg/dL — ABNORMAL HIGH (ref 70–99)
Glucose-Capillary: 170 mg/dL — ABNORMAL HIGH (ref 70–99)
Glucose-Capillary: 199 mg/dL — ABNORMAL HIGH (ref 70–99)

## 2011-03-28 LAB — URINALYSIS, ROUTINE W REFLEX MICROSCOPIC
Bilirubin Urine: NEGATIVE
Glucose, UA: NEGATIVE mg/dL
Hgb urine dipstick: NEGATIVE
Ketones, ur: NEGATIVE mg/dL
Leukocytes, UA: NEGATIVE
Nitrite: NEGATIVE
Protein, ur: 100 mg/dL — AB
Specific Gravity, Urine: 1.025 (ref 1.005–1.030)
Urobilinogen, UA: 1 mg/dL (ref 0.0–1.0)
pH: 5.5 (ref 5.0–8.0)

## 2011-03-28 LAB — POCT CARDIAC MARKERS
CKMB, poc: <1 ng/mL — ABNORMAL LOW (ref 1.0–8.0)
CKMB, poc: <1 ng/mL — ABNORMAL LOW (ref 1.0–8.0)
Myoglobin, poc: 42.3 ng/mL (ref 12–200)
Myoglobin, poc: 75.9 ng/mL (ref 12–200)
Troponin i, poc: <0.05 ng/mL (ref 0.00–0.09)
Troponin i, poc: <0.05 ng/mL (ref 0.00–0.09)

## 2011-03-28 LAB — DIFFERENTIAL
Basophils Absolute: 0 K/uL (ref 0.0–0.1)
Basophils Relative: 0 % (ref 0–1)
Eosinophils Absolute: 0 10*3/uL (ref 0.0–0.7)
Eosinophils Relative: 1 % (ref 0–5)
Lymphocytes Relative: 41 % (ref 12–46)
Lymphs Abs: 2 K/uL (ref 0.7–4.0)
Monocytes Absolute: 0.2 10*3/uL (ref 0.1–1.0)
Monocytes Relative: 5 % (ref 3–12)
Neutro Abs: 2.6 K/uL (ref 1.7–7.7)
Neutrophils Relative %: 53 % (ref 43–77)

## 2011-03-28 LAB — CBC
HCT: 38 % — ABNORMAL LOW (ref 39.0–52.0)
HCT: 40.5 % (ref 39.0–52.0)
Hemoglobin: 13.1 g/dL (ref 13.0–17.0)
MCHC: 33.2 g/dL (ref 30.0–36.0)
MCHC: 34.4 g/dL (ref 30.0–36.0)
MCV: 91.5 fL (ref 78.0–100.0)
MCV: 91.6 fL (ref 78.0–100.0)
Platelets: 236 K/uL (ref 150–400)
Platelets: 259 10*3/uL (ref 150–400)
RBC: 4.15 MIL/uL — ABNORMAL LOW (ref 4.22–5.81)
RDW: 13.3 % (ref 11.5–15.5)
RDW: 13.3 % (ref 11.5–15.5)
WBC: 4.8 K/uL (ref 4.0–10.5)

## 2011-03-28 LAB — PROTIME-INR
INR: 1 (ref 0.00–1.49)
Prothrombin Time: 13.2 s (ref 11.6–15.2)

## 2011-03-28 LAB — CK TOTAL AND CKMB (NOT AT ARMC)
Relative Index: INVALID (ref 0.0–2.5)
Total CK: 84 U/L (ref 7–232)

## 2011-03-28 LAB — BASIC METABOLIC PANEL WITH GFR
BUN: 9 mg/dL (ref 6–23)
Calcium: 9.4 mg/dL (ref 8.4–10.5)
Creatinine, Ser: 0.87 mg/dL (ref 0.4–1.5)
GFR calc non Af Amer: >60 mL/min (ref >60)
Glucose, Bld: 135 mg/dL — ABNORMAL HIGH (ref 70–99)
Potassium: 4.4 meq/L (ref 3.5–5.1)

## 2011-03-28 LAB — BASIC METABOLIC PANEL
CO2: 30 mEq/L (ref 19–32)
Chloride: 108 mEq/L (ref 96–112)
GFR calc Af Amer: >60 mL/min (ref >60)
Sodium: 143 mEq/L (ref 135–145)

## 2011-03-28 LAB — HEPARIN LEVEL (UNFRACTIONATED)
Heparin Unfractionated: 0.47 IU/mL (ref 0.30–0.70)
Heparin Unfractionated: 0.65 [IU]/mL (ref 0.30–0.70)

## 2011-03-28 LAB — URINE MICROSCOPIC-ADD ON

## 2011-03-28 LAB — HEMOGLOBIN A1C
Hgb A1c MFr Bld: 8.2 % — ABNORMAL HIGH (ref 4.6–6.1)
Mean Plasma Glucose: 189 mg/dL

## 2011-03-28 LAB — APTT: aPTT: 30 s (ref 24–37)

## 2011-03-28 LAB — CARDIAC PANEL(CRET KIN+CKTOT+MB+TROPI)
Relative Index: INVALID (ref 0.0–2.5)
Troponin I: 0.02 ng/mL (ref 0.00–0.06)

## 2011-05-06 NOTE — Op Note (Signed)
NAME:  Kevin Castillo, Kevin Castillo NO.:  [id number]   MEDICAL RECORD NO.:  [id number]          PATIENT TYPE:  INP   LOCATION:  2312                         FACILITY:  [facility]   PHYSICIAN:  Ivin Poot, M.D.  DATE OF BIRTH:  August 24, 1946   DATE OF PROCEDURE:  08/01/2005  DATE OF DISCHARGE:                                 OPERATIVE REPORT   OPERATION:  Coronary artery bypass grafting x4 (left internal mammary artery  to left anterior descending coronary artery, saphenous vein graft to obtuse  marginal, sequential saphenous vein graft to right coronary artery and  posterior descending).   PREOPERATIVE DIAGNOSIS:  Class IV unstable angina with severe three vessel  coronary disease.   SURGEON:  Ivin Poot, M.D.   ASSISTANT:  Suzzanne Cloud, PA-C   ANESTHESIA:  General by Finis Bud, M.D.   INDICATIONS:  The patient is a 65 year old black gentleman who presented  with unstable angina and had positive cardiac enzymes.  Cardiac  catheterization demonstrated ejection fraction of 45% with anterior and  apical hypokinesia and severe three-vessel coronary disease.  He was felt to  be a candidate for surgical revascularization.  Prior to surgery, I examined  the patient in his hospital room on several occasions and reviewed the  results the cardiac catheterization with the patient.  I discussed the  indications and expected benefits of coronary bypass surgery for treatment  of his coronary disease.  I reviewed the alternatives to surgical therapy as  well with the patient.  I discussed with the patient the major aspects of  the planned procedure including the choice of conduit for grafts, the  location of the surgical incisions, the use of general anesthesia and  cardiopulmonary bypass, and the expected postoperative hospital recovery  period.  I discussed with the patient the risks to him of coronary bypass  surgery including risks of MI, CVA, bleeding, infection, and  death.  He  understood due to his heavy smoking history, his diabetes, his obesity, and  his diabetic pattern of the disease, that he was an increased risk over an  average patient for this operation.  He agreed to proceed with the operation  as planned under what I felt was an informed consent.   OPERATIVE FINDINGS:  The mammary artery was a good vessel with excellent  flow.  The saphenous vein was harvested endoscopically and was an adequate  vessel.  The diagonal branches were too small to graft.  The LAD had diffuse  disease with plaque in the wall.  The patient was given the aprotinin  protocol for this operation.  The patient was not given any blood  transfusion products.  The total pump time was 125 minutes with crossclamp  time of 80 minutes.   PROCEDURE:  The patient was brought to operating room and placed supine on  the operating table, and general anesthesia was induced under invasive  hemodynamic monitoring.  The chest, abdomen and legs were prepped with  Betadine and draped as a sterile field.  A sternal incision was made as the  saphenous vein was harvested endoscopically from the  right leg.  The left  internal mammary artery was harvested as a pedicle graft from its origin at  the subclavian vessels and was a good vessel with excellent flow.  Heparin was administered systemically and the ACT was documented as being  therapeutic.  The sternal retractor was placed.  The pericardium was opened  and suspended.  Pursestrings were placed in the ascending aorta and right  atrium, and the patient was cannulated and placed on bypass.  The coronaries  were identified for grafting, and the mammary artery and vein grafts were  prepared for the distal anastomoses.  Cardioplegia cannulas were placed both  in the aorta and into the coronary sinus for antegrade and retrograde cold  blood cardioplegia.  The patient was cooled to 32 degrees.  The aortic  crossclamp was applied.  Cold blood  cardioplegia 800 mL was delivered in  split doses between the antegrade aortic and retrograde coronary sinus  catheters.  There was good cardioplegic arrest and septal temperature  dropped to less than 15 degrees.  Topical iced saline was used to augment  myocardial preservation and a pericardial insulator pad was used protect the  left phrenic nerve.   The distal coronary anastomoses were then performed.  The first distal  anastomosis consisted of a sequential vein graft from the distal right  coronary artery to the posterior descending.  The mid-right coronary was a  1.7 mm vessel with a proximal 80% stenosis.  A side-to-side anastomosis with  a reversed saphenous vein was sewn using running 7-0 Prolene.  The second  distal anastomosis was the continuation of the sequential vein graft to the  posterior descending.  This had an area of long 80% stenosis.  The end of  the vein was sewn end-to-side with running 7-0 Prolene.  There was good flow  through the graft.  Cardioplegia was redosed.  The third distal anastomosis  was of the obtuse marginal.  This is a small 1.5 mm vessel with a proximal 80% stenosis.  A reversed  saphenous vein was sewn end-to-side with running 7-0 Prolene with good flow  through graft.  Cardioplegia was redosed.  The fourth distal anastomosis was  to the distal third of the LAD.  This is a 1.6 mm vessel with a proximal 90%  stenosis.  The left internal mammary artery pedicle was brought through an  opening created in the left lateral pericardium.  It was brought down onto  the LAD and sewn end-to-side with running 8-0 Prolene.  There was excellent flow through the anastomosis after briefly releasing the  pedicle bulldog clamp on the mammary pedicle.  The bulldog was replaced and  the pedicle was secured to the epicardium with two 6-0 Prolene sutures.  Cardioplegia was redosed.   While the crossclamp was still in place, two proximal vein anastomoses were placed  in the ascending aorta using a 4.0 mm punch and running 6-0 Prolene.  Prior to tying down the final proximal anastomosis, air was vented from the  coronaries and the left side of the heart using a dose of retrograde  retrograde warm blood cardioplegia and the usual de-airing maneuvers.  The  final anastomosis on the aorta was tied and the crossclamp was removed.   The heart resumed a spontaneous rhythm.  The cardioplegia catheters were  removed.  Air was aspirated from the vein grafts with a 27-gauge needle.  The bypass grafts were opened and each had good flow.  Hemostasis was documented at the proximal  and distal anastomoses.  The  patient was rewarmed to 37 degrees.  Temporary pacing wires were applied.  The lungs were re-expanded and the ventilator was resumed.  The patient was  weaned from bypass without inotropic support.  He was in a sinus rhythm.  Blood pressure and cardiac output were stable.  The cannulas were removed.  Protamine was administered.  There was no adverse reaction to the protamine.  The mediastinum was irrigated with warm antibiotic irrigation.  The leg  incision was irrigated and closed in a standard fashion.  The pericardium  was loosely reapproximated.  Two mediastinal and a left pleural chest tube  were placed and brought out through separate incisions.  The sternum was  closed with interrupted steel wire.  The pectoralis fascia was closed using  a running #1 Vicryl.  The subcutaneous and skin layers were closed with a  running Vicryl.  The patient remained hemodynamically stable and transferred  to the intensive care unit in stable but critical condition.      Ivin Poot, M.D.  Electronically Signed    PV/MEDQ  D:  08/01/2005  T:  08/02/2005  Job:  [id number]   cc:   [facility]

## 2011-05-06 NOTE — Discharge Summary (Signed)
NAMEDONZELL, Kevin Castillo             ACCOUNT NO.:  [id number]   MEDICAL RECORD NO.:  [id number]          PATIENT TYPE:  INP   LOCATION:  [id number]                         FACILITY:  [facility]   PHYSICIAN:  Birdie Riddle, M.D.  DATE OF BIRTH:  09-04-1946   DATE OF ADMISSION:  05/25/2009  DATE OF DISCHARGE:  05/26/2009                               DISCHARGE SUMMARY   REFERRING PHYSICIAN:  Nolene Ebbs, MD   FINAL DIAGNOSES:  1. Chest pain.  2. Coronary atherosclerosis of native vessels.  3. Aortocoronary bypass.  4. Diabetes mellitus type 2.  5. Hypertension.  6. Tobacco use disorder.  7. Obesity.  8. Organic impotence.  9. History of noncompliance.   DISCHARGE MEDICATIONS:  1. Nitroglycerin 0.4 mg 1 sublingual every 5 minutes x3 as needed for      chest pain.  2. Aspirin 81 mg 1 daily.  3. Metoprolol 100 mg 1 twice daily.  4. Metformin 1000 mg once or twice daily as directed.  5. Pravachol 80 mg 1 daily.  6. Avalide 300/25 mg 1 daily.   FOLLOWUP:  Follow up with Dr. Dixie Dials in 1-2 weeks.  The patient to  call [phone] for appointment.   DISCHARGE DIET:  Low-sodium, heart-healthy diet.   DISCHARGE ACTIVITY:  The patient to increase activity slowly and stop  any activity that causes chest pain, shortness of breath, dizziness,  sweating or excessive weakness.   HISTORY:  This 65 year old black male presented with a burning type  retrosternal exertional nonradiating chest pain without nausea or  vomiting, but with some sweating spell.  The patient had a history of  coronary artery disease and bypass graft surgery, but had noncompliance  issues.  He had no chest pain after one sublingual nitroglycerin in the  emergency room.  His past medical history is positive for diabetes,  hypertension, smoking, elevated cholesterol level, and obesity.  He had  a bypass surgery done in August 2006, by Dr. Prescott Gum with LIMA to LAD  and saphenous vein graft to OM, RCA and PDA.   PHYSICAL EXAMINATION:  VITAL SIGNS:  Temperature 97, pulse 62,  respirations 20, blood pressure 157/90, and oxygen saturation 99% on  room air.  GENERAL:  The patient is well-built and well-nourished black male.  ENT:  The patient is normocephalic and atraumatic with pupil equally  reacting to light.  Extraocular movement intact.  Eyes brown.  Conjunctivae pink.  Sclerae are nonicteric.  NECK:  Supple.  No JVD.  LUNGS:  Clear to auscultation.  HEART:  Normal S1 and S2.  ABDOMEN:  Soft and nontender, but distended.  EXTREMITIES:  No edema, cyanosis, or clubbing.  NEUROLOGIC:  The patient was alert and oriented x3, and cranial nerves  were grossly intact.   LABORATORY DATA:  Normal hemoglobin/hematocrit, WBC count, and platelet  count.  Normal electrolytes, BUN, and creatinine.  Normal CK-MB and  troponin I.   Nuclear stress test consistent with small infarct.  Normal wall motion  and contractility.  LVEF of 52% and moderate region of peri-infarct  ischemia of the mid and apical segment of the anterior  wall.   HOSPITAL COURSE:  The patient was admitted to telemetry unit and  myocardial infarction was ruled out.  He underwent nuclear stress test  that showed a very little peri-infarct ischemia.  Hence on May 26, 2009,  the patient was discharged home in satisfactory condition with followup  by me in 1 week.      Birdie Riddle, M.D.  Electronically Signed     ASK/MEDQ  D:  06/24/2009  T:  06/24/2009  Job:  [id number]

## 2011-05-06 NOTE — Consult Note (Signed)
NAME:  Kevin Castillo, Kevin Castillo NO.:  [id number]   MEDICAL RECORD NO.:  [id number]          PATIENT TYPE:  INP   LOCATION:  3303                         FACILITY:  [facility]   PHYSICIAN:  Ivin Poot, M.D.  DATE OF BIRTH:  1946-06-06   DATE OF CONSULTATION:  07/29/2005  DATE OF DISCHARGE:                                   CONSULTATION   REFERRING PHYSICIAN:  Richard A. Rollene Fare, M.D.   PRIMARY CARE PHYSICIAN:  Nolene Ebbs, M.D.   REASON FOR CONSULTATION:  Severe three-vessel coronary artery disease with  unstable angina and subendocardial MI.   CHIEF COMPLAINT:  Chest pain.   HISTORY OF PRESENT ILLNESS:  I was asked to evaluate this 65 year old,  African-American gentleman for potential surgical coronary revascularization  for recently diagnosed severe coronary disease.  He has no past history of  coronary disease or prior cardiac evaluation.  He was asleep when he  developed severe substernal crushing chest pain on August 8.  He had  preceding similar symptoms, but not as severe in the previous 2-3 weeks.  The pain waxed and waned and persisted until morning when he presented to  the emergency department where he was found to have positive cardiac enzymes  and he was admitted to the hospital.  He is placed on heparin and  nitroglycerin and within 24 hours underwent diagnostic cardiac  catheterization by Dr. Claiborne Billings.  His cardiac enzymes were minimally elevated.  He was found to have severe three-vessel coronary disease in a diabetic  pattern with 90% stenosis of the LAD, 95% stenosis of the circumflex and 80%  stenosis of the right coronary.  Ejection fraction was well-preserved and  left ventricular end-diastolic pressure was 16 mmHg.  A follow-up 2D  echocardiogram was performed which showed no evidence of valvular heart  disease.  He was felt to be a candidate for surgical revascularization.  He  is currently stable on Lovenox.   PAST MEDICAL HISTORY:  1.  Type  2 diabetes mellitus.  2.  Hypertension.  3.  Dyslipidemia.  4.  Chronic bronchitis from chronic active smoking.  5.  Morbid obesity.   MEDICATIONS:  1.  Diovan/HCT 160/12.5 daily.  2.  Ecotrin 81 mg q.d.  3.  Glucophage 100 mg b.i.d.  4.  Norvasc 1 mg day.  5.  Viagra 50 mg p.r.n.   ALLERGIES:  No known drug allergies.   SOCIAL HISTORY:  The patient is retired.  He drinks one bottle of liquor  weekly.  He smokes one pack of cigarettes a day.  He is married.   FAMILY HISTORY:  Positive for COPD and stroke.   REVIEW OF SYSTEMS:  CONSTITUTIONAL:  Negative for fever, weight loss.  ENT:  Negative for change in vision or active dental complaints or difficulty  swallowing.  THORACIC:  Negative for thoracic trauma or abnormal chest x-  ray.  CARDIAC:  Review is positive for his recent unstable angina and MI.  ENDOCRINE:  Review is positive for diabetes, negative thyroid disease.  HEMATOLOGIC:  Negative bleeding disorder or prior blood transfusion.  SURGICAL:  Negative for prior  surgical history.  NEUROLOGIC:  Negative for  stroke or seizure.   PHYSICAL EXAMINATION:  VITAL SIGNS:  The patient is 5 feet 7 inches, weight  235 pounds.  Blood pressure is 132/74, pulse is 80 sinus rhythm,  respirations 20, room air saturation 96%.  GENERAL:  A middle-aged black male known stress in the hospital recovery  from his MI following cardiac catheterization.  HEENT:  Exam is normocephalic.  Pharynx clear.  Dentition adequate.  NECK:  Without JVD, mass or bruit.  LYMPHS:  No palpable adenopathy.  LUNGS:  Scattered rhonchi.  No deformity.  CARDIAC:  Regular rhythm without S3 gallop or murmur.  ABDOMEN:  Obese, soft, nontender without organomegaly.  EXTREMITIES:  1+ pedal edema.  No deformity or tenderness.  VASCULAR:  Exam is with 2+ bilateral radial and femoral pulses, nonpalpable  pedal pulses.  He has some venous stasis changes of venous insufficiency in  the pretibial areas bilaterally.   NEUROLOGIC:  Alert and oriented without focal deficit.   LABORATORY DATA:  I reviewed the coronary arteriogram with Dr. Claiborne Billings and  gave his impression of severe three-vessel disease and preserved LV  function. His chest x-ray shows some evidence of chronic bronchitis.   His pO2 on room air is 65 with pCO2 of 45 and PFTs are pending.  His  creatinine post catheterization is 1.0.   IMPRESSION AND PLAN:  The patient has severe two-vessel disease and would  benefit surgical revascularization.  I discussed the procedure in detail  with the patient and wife and have scheduled surgery for the afternoon of  August 14.  Prior to surgery, he will continue with pulmonary toilet and  diabetic control.  Thank for the consultation.       PV/MEDQ  D:  07/29/2005  T:  07/29/2005  Job:  [id number]   cc:   CVTS office   [facility]

## 2011-05-06 NOTE — H&P (Signed)
NAME:  Kevin Castillo, Kevin Castillo             ACCOUNT NO.:  [id number]   MEDICAL RECORD NO.:  [id number]          PATIENT TYPE:  INP   LOCATION:  3303                         FACILITY:  [facility]   PHYSICIAN:  Nolene Ebbs, M.D.    DATE OF BIRTH:  31-Jul-1946   DATE OF ADMISSION:  07/26/2005  DATE OF DISCHARGE:                                HISTORY & PHYSICAL   PRESENTATION:  Central chest pain.   HISTORY OF PRESENT COMPLAINTS:  Kevin Castillo is a 65 year old African-  American gentleman with past medical history significant for type 2 diabetes  mellitus, hypertension, tobacco abuse, and noncompliance with medication  therapy.  He presented to the emergency room of [facility] with  complaints of central chest pain that started in the early hours of the  morning at about 2 a.m.  He awoke with a crushing, burning pain in the  substernal region which lasted for about one and a half hours but he did not  take any medications or go to the emergency room.  He reported that he had  similar episodes about a month ago which went away after he took aspirin.  This time the pain returned at about 8:00 in the morning and again was  unrelenting.  The pain was radiating to his left shoulder and to his left  lower back.  He therefore proceeded to the emergency room to be evaluated.  He reported breaking out in a sweat.  He denied any shortness of breath or  palpitations.  He did have one episode of nausea, but no vomiting.  He  denied any dizziness or blurring of vision or weakness of extremities.  At  the emergency room he was noted to have an elevated troponin and I was  therefore invited to admit him to the hospital.   PAST MEDICAL HISTORY:  1.  Hypertension.  2.  Type 2 diabetes mellitus.  3.  Dyslipidemia.  4.  Chronic bronchitis related to tobacco abuse.  5.  Morbid obesity.  6.  Erectile dysfunction.   PAST SURGICAL HISTORY:  Nonsignificant.   MEDICATIONS:  1.  Diovan/HCT 160/12.5  daily.  2.  Ecotrin 81 mg daily.  3.  Fortamet or Glucophage 1000 mg b.i.d.  4.  Norvasc 1 mg daily.  5.  He uses Viagra 100 mg half to one daily p.r.n.   He has no history of drug allergies.   FAMILY AND SOCIAL HISTORY:  His father is deceased from emphysema at the age  of 59.  His mother died of stroke at the age of 30.  He has one brother and  five sisters who are alive and well.  He has nine children in good health.  He smokes one pack of cigarettes per day for many years.  He drinks one  bottle of liquor per week.  He denied any use of illicit drugs.   REVIEW OF SYSTEMS:  GENERAL:  He denies any fatigue, weakness, fevers, or  chills.  CNS:  He denies any dizziness, blurring of vision, weakness of  extremities, or slurring of speech.  CARDIOVASCULAR:  As above.  RESPIRATORY:  He denies any cough, sputum production, hemoptysis, or  wheezing.  GI:  He has some nausea, but no abdominal pain, vomiting,  diarrhea, or constipation.  GU:  He denies any dysuria, frequency, or  hematuria.  MUSCULOSKELETAL:  He denies any joint pains or joint swelling.   PHYSICAL EXAMINATION:  GENERAL:  He is lying comfortably in bed right now.  He is not in any pain.  He is not in any acute respiratory distress.  VITAL SIGNS:  Blood pressure 125/65, heart rate of 78, regular, respiratory  rate 16, O2 saturations on 2 L of nasal cannula is 99%.  HEENT:  He has protruding eyeballs.  He is not pale.  He is not icteric.  He  is not cyanotic.  NECK:  Supple with no elevated JVD.  No cervical lymphadenopathy.  CHEST:  Good air entry bilaterally with no rales, rhonchi, or wheezes.  Heart sounds 1 and 2 are heard with no murmurs or gallops.  ABDOMEN:  Obese, soft, nontender.  No palpable masses.  Bowel sounds are  present.  EXTREMITIES:  No edema.  No ulcers.  No calf tenderness.  Peripheral pulses  are present bilaterally with no bruit.  CNS:  Alert and oriented x3 with no focal neurological deficits.    LABORATORY DATA:  His EKG shows normal sinus rhythm with nonspecific T-wave  changes in the inferolateral leads.  There are no acute ST-T wave changes.  Sodium 136, potassium 3.2, chloride 100, bicarbonate 29, BUN 15, creatinine  1, glucose 290.  White count is 5.4, hemoglobin 14.3, hematocrit 41.9, and  platelet count of 355.  His liver function test is essentially normal.  His  cardiac markers shows point of care troponin of 0.43 to 0.36 to 0.33.   ASSESSMENT:  Kevin Castillo is a 65 year old African-American gentleman  with multiple cardiac risks admitted via the emergency room at [facility] with central chest pain suggestive of unstable angina.  He seemed  to have developed a non ST elevation myocardial infarction and he will be  admitted to stepdown unit.   PLAN OF CARE:  Admit to stepdown unit.  He will be placed on a 2 g sodium,  carbohydrate modified diet.  He will be on bed rest.  Vital signs q.4h.  CBGs q.a.c. and h.s.  Serial troponins and EKG will be obtained.  He will be  started on intravenous fluids half normal saline with 20 mEq potassium at 75  mL/hour, intravenous nitroglycerin to titrate to pain control, intravenous  heparin for pharmacy protocol, sliding scale NovoLog insulin for moderate to  sensitive scale.  His home medication will be continued.  He will also be  possibly started on metoprolol 12.5 mg b.i.d.  A cardiology consult will be  requested in the morning for further evaluation of his coronaries.   ADMITTING DIAGNOSES:  1.  Unstable angina with non ST elevation myocardial infarction.  2.  Type 2 diabetes mellitus, uncontrolled.  His plan of care has been      discussed with him and his wife who is present at his bedside and all      his questions have been answered.      Nolene Ebbs, M.D.  Electronically Signed     EA/MEDQ  D:  07/27/2005  T:  07/27/2005  Job:  [id number]

## 2011-05-06 NOTE — Cardiovascular Report (Signed)
NAME:  Kevin Castillo, LEWI NO.:  [id number]   MEDICAL RECORD NO.:  [id number]          PATIENT TYPE:  INP   LOCATION:  3303                         FACILITY:  [facility]   PHYSICIAN:  Shelva Majestic, M.D.     DATE OF BIRTH:  06-14-1946   DATE OF PROCEDURE:  07/27/2005  DATE OF DISCHARGE:                              CARDIAC CATHETERIZATION   INDICATIONS:  Mr. Rudy Aminov is a 65 year old African-American male  patient of Dr. Jeanie Cooks who has a longstanding history of hypertension,  diabetes mellitus, tobacco use, as well as family history of coronary artery  disease.  He was admitted to [facility] by Dr. Jeanie Cooks yesterday  with symptom complex compatible with unstable angina pectoris.  Troponin has  subsequently been positive at 0.36 and 0.21.  His ECG has shown sinus rhythm  with nonspecific ST-T changes and T-wave inversion in leads I and L, V5-V6.  Definitive cardiac catheterization was recommended.   PROCEDURE:  After pre medication with Valium 5 mg intravenously, the patient  prepped and draped in the usual fashion.  His right femoral artery was  punctured anteriorly and a 5-French arterial sheath was inserted.  Diagnostic catheterization was done with a 5-French Judkins 4 left and right  coronary catheters.  A 5-French pigtail catheter was used for biplane cine  left ventriculography.  Due to the patient's hypertensive history distal  aortography was also performed.  The patient tolerated the procedure well.  Hemostasis was obtained by direct manual pressure.   HEMODYNAMIC DATA:  Central aortic pressure was 130/77, mean 99.  Left  ventricle pressure is 130/10, post A-wave 16.   ANGIOGRAPHIC DATA:  There was evidence for moderate coronary calcification  involving the left main LAD system.   The left main coronary artery was moderate sized vessel that had distal  tapering of approximately 42% prior to bifurcating into an LAD and left  circumflex  system.   The LAD continued to have proximal narrowing of 40% continuing from the left  main.  The LAD gave rise to a first diagonal vessel that had 60% narrowing  prior to being 80-90% stenosed before bifurcating.  The inferior limb of  this bifurcation of the diagonal vessel had diffuse disease of 80-90%.  The  LAD after the first diagonal vessel had narrowing of 50s-70%.  The second  diagonal vessel had 95% diffuse stenosis proximally.  The LAD after the  second diagonal vessel and septal perforating artery had diffuse  irregularities with narrowings of 50% followed by diffuse 70-80% narrowing  and then had more distal 70% diffuse stenosis.   The circumflex vessel had evidence for 40% ostial narrowing.  The first  marginal vessel had focal 95% stenosis after arising from the AV groove  circumflex with narrowing of 40% in the AV groove circumflex.   The right coronary was a large-caliber dominant vessel that had 70% proximal  stenosis on the proximal bend in the vessel.  There was eccentric 80% mid  stenosis.  There was 60% ostial stenosis in the first PDA vessel.  The  second PDA vessel had diffuse irregularities with  narrowings of 30-50%.  The  PLA vessel had diffuse narrowings of approximately 30-40%.   Biplane cine left ventriculography revealed mild LV dysfunction with an  ejection fraction of 50%.  There was suggestion of mid inferior  hypocontractility.  On the LAO projection there was mild apical  hypocontractility.   Distal aortography revealed minimal smooth narrowing of approximately 20-30%  in the left renal artery.  There was no evidence for aneurysmal dilatation  of the aorta.  There was mild irregularity in the iliac system without high-  grade focal stenoses.   IMPRESSION:  1.  Mild left ventricular dysfunction with suggestion of mild inferior      apical hypocontractility with an ejection fraction of 50%.  2.  Significant coronary calcification with diffuse  multivessel coronary      artery disease with 40% narrowing in the distal left main and proximal      left anterior descending and circumflex, 60 and 80% first diagonal      stenoses with diffuse 80-90% mid first diagonal stenoses, 95% second      diagonal stenosis, diffuse left anterior descending stenoses of 70-80%      in the proximal to mid segment and 70% distally; 40% ostial circumflex      stenosis with 95% stenosis in the obtuse marginal 1 vessel, and dominant      right coronary artery with diffuse 70% proximal, 80% mid stenosis as      well as 60% posterior descending artery stenosis with irregularities of      30-40% in the PT2 and posterolateral vessel.   DISCUSSION:  Mr. Berney is 65 year old gentleman with significant multiple  risk factors including with long-standing hypertension, type 2 diabetes  mellitus, tobacco use, family history of coronary artery disease who  presented with unstable angina and is ruled in for probable small non-ST  segment elevation MI.  Catheterization reveals diffuse coronary  calcification with diffuse multivessel CAD.  CVTS evaluation will be  recommended for probable CBG revascularization surgery for optimal  management.       TK/MEDQ  D:  07/27/2005  T:  07/27/2005  Job:  [id number]   cc:   Neill Loft   Cath Lab

## 2011-05-06 NOTE — Discharge Summary (Signed)
NAME:  Kevin Castillo, Kevin Castillo NO.:  [id number]   MEDICAL RECORD NO.:  [id number]          PATIENT TYPE:  INP   LOCATION:  2015                         FACILITY:  [facility]   PHYSICIAN:  Ivin Poot, M.D.  DATE OF BIRTH:  [birth year]   DATE OF ADMISSION:  07/26/2005  DATE OF DISCHARGE:  08/05/2005                                 DISCHARGE SUMMARY   PRIMARY DIAGNOSIS:  Class 4 unstable angina with severe three vessel  coronary artery disease.   IN-HOSPITAL DIAGNOSIS:  1.  Volume overload.  2.  Acute blood loss anemia postoperatively.  3.  Sinus tachycardia.   SECONDARY DIAGNOSIS:  1.  Hypertension.  2.  Diabetes mellitus type 2.  3.  Dyslipidemia.  4.  Chronic bronchitis related to tobacco abuse.  5.  Morbid obesity.  6.  Erectile dysfunction.   ALLERGIES:  No known drug allergies.   IN-HOSPITAL OPERATION AND PROCEDURES:  1.  Cardiac catheterization.  2.  Coronary artery bypass graft x 4 with the left internal mammary artery      to the left anterior descending, saphenous vein graft to obtuse      marginal, sequential saphenous vein graft to right coronary artery and      posterior descending artery.   HOSPITAL COURSE:  Kevin Castillo is a 65 year old African American gentleman  who has no prior history of coronary artery disease or prior cardiac  evaluation.  He was asleep when he developed severe substernal crushing  chest pain on July 26, 2005.  He had preceding similar symptoms but not as  severe in the previous 2-3 weeks.  The pain waxed and waned until the  morning when he presented to the emergency department where he was found to  have positive cardiac enzymes and he was admitted to the hospital.  He was  placed on heparin and nitroglycerin and within 24 hours underwent diagnostic  cardiac catheterization by Dr. Claiborne Billings.  His cardiac enzymes were minimally  elevated.  He was found to have severe three vessel coronary artery disease  in a diabetic pattern  with 90% stenosis of the LAD, 95% stenosis of the  circumflex, and 80% stenosis of the RCA.  Ejection fraction was well  preserved and left ventricular end diastolic pressure was 60 mmHg.  Follow  up 2D echocardiogram was performed which showed no evidence of valvular  heart disease.  The patient was felt to be a surgical candidate for  revascularization.  Dr. Prescott Gum was then consulted.  Dr. Prescott Gum  discussed with the patient undergoing coronary artery bypass grafting.  He  discussed the risks and benefits of the procedure.  The patient acknowledged  understanding and wished to proceed.  He was scheduled for surgery on August 01, 2005.  Preoperatively, he underwent bilateral carotid duplex ultrasound  which showed no evidence of significant ICA stenosis bilaterally.  Prior to  undergoing surgery, the patient remained stable.   Kevin Castillo was taken to the operating room on August 01, 2005, where he  underwent coronary artery bypass grafting x 4 with the left the left  internal  mammary artery to the left anterior descending, saphenous vein  graft to obtuse marginal, sequential saphenous vein graft to right coronary  artery and posterior descending artery.  The patient tolerated the procedure  well and was transferred up to the intensive care unit in stable condition.  He was extubated the late evening of surgery.  The patient's postoperative  course was pretty much unremarkable.  On day one, his lines and chest tubes  were discontinued.  The patient was out of bed and ambulating.  He was alert  and oriented x 3, neurologically intact.  The patient was transferred out to  2000 on postoperative day two.  His incisions were dry, intact, and healing  well.  He was hemodynamically stable postoperatively.  He had a hematocrit  of 29%.  Creatinine was stable at 0.9.  His CBGs were well controlled.  The  patient was placed on Lantus postoperatively.  The patient did develop a low  grade  temperature on day three with an elevated white count of 14.  Urine  was sent for UA and culture.  This showed to be negative.  This was  monitored.  The patient was also hypertensive with sinus tachycardia and  medication was increased appropriately.  On day four, the patient's  temperature was within normal limits and white count was back down to 8.2.  Creatinine remained stable at 1.  The patient was tentatively ready for  discharge on postoperative day five.  He remained in normal sinus rhythm.  The incisions were dry, intact, and healing well.  Respiratory exam did show  bilateral crackles noted.  This was monitored.  The patient was saturating  92-94% on room air.  He was out of bed ambulating well.   DISCHARGE INSTRUCTIONS:  A follow up appointment is to be scheduled with Dr.  Prescott Gum on August 26, 2005, at 11:45 a.m.  The patient is to obtain a PA  and lateral chest x-ray one hour prior to this appointment.  The patient is  to contact Dr. Evette Georges office to schedule a follow up appointment in two  weeks.  The patient acknowledged understanding.  Kevin Castillo received  instructions on diet, activity, incisional care.  He was told no driving  until released to do so, no heavy lifting over 10 pounds.  He was told he  was allowed to shower washing his incisions using soap and water.  The  patient is to contact the office if he develops any drainage from his  incision sites.  Kevin Castillo received instructions on diet, low salt, low  fat, and modified carbohydrate modified medium calorie diet.  He was also  instructed to ambulate 3-4 times per day to progress as tolerated and to  continue doing his breathing exercises.  The patient, again, acknowledged  understanding.   DISCHARGE MEDICATIONS:  1.  Aspirin 325 mg p.o. daily.  2.  Metoprolol 100 mg p.o. b.i.d.  3.  Avapro 150 mg p.o. q.h.s.  4.  Zocor 80 mg p.o. q.h.s.  5.  Glucophage 1000 mg p.o. b.i.d. 6.  Lasix 40 mg p.o. daily  x 7 days.  7.  Potassium chloride 20 mEq p.o. daily x 7 days.  8.  Oxycodone 5 mg 1-2 tablets p.o. q.4-6h. p.r.n. pain.      Darlin Coco, [place]      Ivin Poot, M.D.  Electronically Signed    KMD/MEDQ  D:  08/05/2005  T:  08/05/2005  Job:  [id number]

## 2011-09-01 ENCOUNTER — Ambulatory Visit
Admission: RE | Admit: 2011-09-01 | Discharge: 2011-09-01 | Disposition: A | Discharge status: Home / Self Care | Payer: Medicare HMO | Source: Ambulatory Visit | Attending: Cardiovascular Disease | Admitting: Cardiovascular Disease

## 2011-09-01 ENCOUNTER — Other Ambulatory Visit: Payer: Self-pay | Admitting: Cardiovascular Disease

## 2011-09-01 DIAGNOSIS — R52 Pain, unspecified: Secondary | ICD-10-CM

## 2011-09-30 LAB — I-STAT 8, (EC8 V) (CONVERTED LAB)
Acid-Base Excess: 6 — ABNORMAL HIGH
Bicarbonate: 31.8 — ABNORMAL HIGH
HCT: 47
Operator id: 234501
TCO2: 33
pCO2, Ven: 50
pH, Ven: 7.411 — ABNORMAL HIGH

## 2011-09-30 LAB — POCT I-STAT CREATININE: Operator id: 234501

## 2012-11-11 ENCOUNTER — Emergency Department (HOSPITAL_COMMUNITY)
Admission: EM | Admit: 2012-11-11 | Discharge: 2012-11-11 | Disposition: A | Discharge status: Home / Self Care | Payer: Medicare HMO | Attending: Emergency Medicine | Admitting: Emergency Medicine

## 2012-11-11 ENCOUNTER — Emergency Department (HOSPITAL_COMMUNITY): Payer: Medicare HMO

## 2012-11-11 ENCOUNTER — Encounter (HOSPITAL_COMMUNITY): Payer: Self-pay | Admitting: *Deleted

## 2012-11-11 ENCOUNTER — Other Ambulatory Visit: Payer: Self-pay

## 2012-11-11 DIAGNOSIS — E119 Type 2 diabetes mellitus without complications: Secondary | ICD-10-CM | POA: Insufficient documentation

## 2012-11-11 DIAGNOSIS — F10929 Alcohol use, unspecified with intoxication, unspecified: Secondary | ICD-10-CM

## 2012-11-11 DIAGNOSIS — Z79899 Other long term (current) drug therapy: Secondary | ICD-10-CM | POA: Insufficient documentation

## 2012-11-11 DIAGNOSIS — F1010 Alcohol abuse, uncomplicated: Secondary | ICD-10-CM | POA: Insufficient documentation

## 2012-11-11 DIAGNOSIS — I2510 Atherosclerotic heart disease of native coronary artery without angina pectoris: Secondary | ICD-10-CM | POA: Insufficient documentation

## 2012-11-11 DIAGNOSIS — I10 Essential (primary) hypertension: Secondary | ICD-10-CM | POA: Insufficient documentation

## 2012-11-11 DIAGNOSIS — R079 Chest pain, unspecified: Secondary | ICD-10-CM | POA: Insufficient documentation

## 2012-11-11 DIAGNOSIS — F17200 Nicotine dependence, unspecified, uncomplicated: Secondary | ICD-10-CM | POA: Insufficient documentation

## 2012-11-11 HISTORY — DX: Atherosclerotic heart disease of native coronary artery without angina pectoris: I25.10

## 2012-11-11 LAB — CBC
HCT: 36.4 % — ABNORMAL LOW (ref 39.0–52.0)
Hemoglobin: 12.2 g/dL — ABNORMAL LOW (ref 13.0–17.0)
MCH: 28.5 pg (ref 26.0–34.0)
MCV: 85 fL (ref 78.0–100.0)
RBC: 4.28 MIL/uL (ref 4.22–5.81)

## 2012-11-11 LAB — POCT I-STAT TROPONIN I: Troponin i, poc: 0.02 ng/mL (ref 0.00–0.08)

## 2012-11-11 LAB — BASIC METABOLIC PANEL
BUN: 14 mg/dL (ref 6–23)
CO2: 26 mEq/L (ref 19–32)
Calcium: 9 mg/dL (ref 8.4–10.5)
Glucose, Bld: 89 mg/dL (ref 70–99)
Sodium: 141 mEq/L (ref 135–145)

## 2012-11-11 LAB — ETHANOL: Alcohol, Ethyl (B): 201 mg/dL — ABNORMAL HIGH (ref 0–11)

## 2012-11-11 MED ORDER — AMOXICILLIN 500 MG PO CAPS: 500.0000 mg | ORAL_CAPSULE | Freq: Three times a day (TID) | ORAL | Status: DC

## 2012-11-11 NOTE — ED Provider Notes (Signed)
History     CSN: [id number]  Arrival date & time 11/11/12  1250   First MD Initiated Contact with Patient 11/11/12 1332      Chief Complaint  Patient presents with  . Chest Pain  . Hypertension    (Consider location/radiation/quality/duration/timing/severity/associated sxs/prior treatment) The history is provided by the patient.  Kevin Castillo is a 66 y.o. male hx of DM, CAD s/p CABG here with chest pain, ETOH intoxication. He was drinking alcohol several hours ago after finding out that his son had a seizure. He came and visited him in the hospital. Then he developed chest pain while walking down the stairs. He also was concerned about his BP. Denies headaches or SOB. Not a chronic alcoholic, but has been under a lot of stress lately.    Past Medical History  Diagnosis Date  . Coronary artery disease   . Diabetes mellitus without complication     Past Surgical History  Procedure Date  . Cardiac surgery     History reviewed. No pertinent family history.  History  Substance Use Topics  . Smoking status: Current Every Day Smoker    Types: Cigarettes  . Smokeless tobacco: Not on file  . Alcohol Use: Yes      Review of Systems  Cardiovascular: Positive for chest pain.  All other systems reviewed and are negative.    Allergies  Review of patient's allergies indicates no known allergies.  Home Medications   Current Outpatient Rx  Name  Route  Sig  Dispense  Refill  . ALLOPURINOL 300 MG PO TABS   Oral   Take 300 mg by mouth daily.         Marland Kitchen AMLODIPINE BESYLATE 10 MG PO TABS   Oral   Take 10 mg by mouth daily.         Marland Kitchen DOXAZOSIN MESYLATE 8 MG PO TABS   Oral   Take 8 mg by mouth at bedtime.         Marland Kitchen METFORMIN HCL 500 MG PO TABS   Oral   Take 500 mg by mouth 2 (two) times daily with a meal.         . METOPROLOL TARTRATE 50 MG PO TABS   Oral   Take 50 mg by mouth 2 (two) times daily.         Marland Kitchen OLMESARTAN-AMLODIPINE-HCTZ 20-5-12.5 MG PO  TABS   Oral   Take 1 tablet by mouth daily.         Marland Kitchen POTASSIUM CHLORIDE ER 10 MEQ PO TBCR   Oral   Take 10 mEq by mouth daily.         Marland Kitchen VITAMIN D (ERGOCALCIFEROL) 50000 UNITS PO CAPS   Oral   Take 50,000 Units by mouth every 7 (seven) days.         . WARFARIN SODIUM 10 MG PO TABS   Oral   Take 10 mg by mouth daily.           BP 156/73  Pulse 83  Temp 97 F (36.1 C) (Oral)  Resp 17  SpO2 99%  Physical Exam  Nursing note and vitals reviewed. Constitutional: He is oriented to person, place, and time. He appears well-developed and well-nourished.       Smells of alcohol   HENT:  Head: Normocephalic.  Mouth/Throat: Oropharynx is clear and moist.  Eyes: Conjunctivae normal are normal. Pupils are equal, round, and reactive to light.  Neck: Normal range of motion. Neck supple.  Cardiovascular: Normal rate, regular rhythm and normal heart sounds.   Pulmonary/Chest: Effort normal and breath sounds normal.       Mild crackles bilaterally   Abdominal: Soft. Bowel sounds are normal.  Musculoskeletal: Normal range of motion. He exhibits no edema and no tenderness.  Neurological: He is alert and oriented to person, place, and time.  Skin: Skin is warm and dry.  Psychiatric: He has a normal mood and affect. His behavior is normal. Judgment and thought content normal.    ED Course  Procedures (including critical care time)  Labs Reviewed  CBC - Abnormal; Notable for the following:    Hemoglobin 12.2 (*)     HCT 36.4 (*)     All other components within normal limits  BASIC METABOLIC PANEL - Abnormal; Notable for the following:    GFR calc non Af Amer 76 (*)     GFR calc Af Amer 89 (*)     All other components within normal limits  ETHANOL - Abnormal; Notable for the following:    Alcohol, Ethyl (B) 201 (*)     All other components within normal limits  POCT I-STAT TROPONIN I   Dg Chest 2 View  11/11/2012  *RADIOLOGY REPORT*  Clinical Data: Chest pain and  shortness breath.  CHEST - 2 VIEW  Comparison: 09/05/2010 and chest CT dated 02/12/2011  Findings: Parenchymal density in the left lower lung is present with some associated pleural thickening versus pleural fluid.  Part of this appearance may be chronic.  However, changes are more prominent compared to older chest x-rays and there may be a component of acute infiltrate present.  No pulmonary edema is identified. There is stable cardiomegaly after prior CABG.  IMPRESSION: Parenchymal density in the left lower lung with associated pleural thickening/fluid.  Component of active infiltrate cannot be excluded.   Original Report Authenticated By: Aletta Edouard, M.D.      No diagnosis found.   Date: 11/11/2012  Rate: 84  Rhythm: normal sinus rhythm  QRS Axis: normal  Intervals: normal  ST/T Wave abnormalities: TWI laterally   Conduction Disutrbances:none  Narrative Interpretation:   Old EKG Reviewed: none available     MDM  Kevin Castillo is a 66 y.o. male here with chest pain, now pain free, also alcohol intoxication. He has significant risk factors for ACS even though pain atypical. Will get trop neg x 2, labs, CXR and reassess. Will get ETOH level.   2:41 PM ETOH level 200. CXR showed parenchymal density but he is not having active coughing or fever so low suspicion for pneumonia. Trop neg x 1. No family at bedside. Given risk factors will get second troponin and waiting for patient to sober up. Will transfer to CDU, report given to PA.         Wandra Arthurs, MD 11/11/12 [id number]

## 2012-11-11 NOTE — ED Provider Notes (Signed)
Recheck trop and EtOH @ 4:30pm.  Has hx of CABG.  If both are unremarkable, pt can be discharge.    BP 153/80  Pulse 81  Temp 98.6 F (37 C) (Oral)  Resp 16  SpO2 100%   On examination he appeared in NAD, VSS. Vital signs as documented. Skin warm and dry and without overt rashes. Neck without JVD. Lungs with rales best head at R lower lobe. Heart exam notable for regular rhythm, normal sounds and absence of murmurs, rubs or gallops. Abdomen unremarkable and without evidence of organomegaly, masses, or abdominal aortic enlargement. Extremities nonedematous. Is A&O x 3, normal gait, normal speech.    5:28 PM CXR shows questionable infiltrate.  Pt admits he's a smoker and have been having nonproductive cough.  Denies fever or chills.  In the setting of questionable infection in this patient, i plan to treat for suspected pneumonia with zpak.  Pt to f/u with his PCP, Dr. Jeanie Cooks. His wife is available at bedside and will drive him home.     6:47 PM Pt stable to be discharge, can ambulate without difficulty.  Pt to f/u with his PCP.    BP 159/80  Pulse 91  Temp 98.6 F (37 C) (Oral)  Resp 16  SpO2 93%  I have reviewed nursing notes and vital signs. I personally reviewed the imaging tests through PACS system  I reviewed available ER/hospitalization records thought the EMR  Results for orders placed during the hospital encounter of 11/11/12  CBC      Component Value Range   WBC 5.7  4.0 - 10.5 K/uL   RBC 4.28  4.22 - 5.81 MIL/uL   Hemoglobin 12.2 (*) 13.0 - 17.0 g/dL   HCT 36.4 (*) 39.0 - 52.0 %   MCV 85.0  78.0 - 100.0 fL   MCH 28.5  26.0 - 34.0 pg   MCHC 33.5  30.0 - 36.0 g/dL   RDW 15.4  11.5 - 15.5 %   Platelets 228  150 - 400 K/uL  BASIC METABOLIC PANEL      Component Value Range   Sodium 141  135 - 145 mEq/L   Potassium 3.6  3.5 - 5.1 mEq/L   Chloride 104  96 - 112 mEq/L   CO2 26  19 - 32 mEq/L   Glucose, Bld 89  70 - 99 mg/dL   BUN 14  6 - 23 mg/dL   Creatinine, Ser  1.00  0.50 - 1.35 mg/dL   Calcium 9.0  8.4 - 10.5 mg/dL   GFR calc non Af Amer 76 (*) >90 mL/min   GFR calc Af Amer 89 (*) >90 mL/min  POCT I-STAT TROPONIN I      Component Value Range   Troponin i, poc 0.02  0.00 - 0.08 ng/mL   Comment 3           ETHANOL      Component Value Range   Alcohol, Ethyl (B) 201 (*) 0 - 11 mg/dL  ETHANOL      Component Value Range   Alcohol, Ethyl (B) 138 (*) 0 - 11 mg/dL  POCT I-STAT TROPONIN I      Component Value Range   Troponin i, poc 0.02  0.00 - 0.08 ng/mL   Comment 3            Dg Chest 2 View  11/11/2012  *RADIOLOGY REPORT*  Clinical Data: Chest pain and shortness breath.  CHEST - 2 VIEW  Comparison: 09/05/2010 and  chest CT dated 02/12/2011  Findings: Parenchymal density in the left lower lung is present with some associated pleural thickening versus pleural fluid.  Part of this appearance may be chronic.  However, changes are more prominent compared to older chest x-rays and there may be a component of acute infiltrate present.  No pulmonary edema is identified. There is stable cardiomegaly after prior CABG.  IMPRESSION: Parenchymal density in the left lower lung with associated pleural thickening/fluid.  Component of active infiltrate cannot be excluded.   Original Report Authenticated By: Aletta Edouard, M.D.       Domenic Moras, PA-C 11/11/12 [phone]

## 2012-11-11 NOTE — ED Notes (Signed)
Report given to Wells Guiles, RN in CDU. Preparing to transport pt to CDU.

## 2012-11-11 NOTE — ED Notes (Signed)
Pt states he came to have his blood pressure checked.

## 2012-11-11 NOTE — ED Provider Notes (Signed)
2:34 PM Pt to move to CDU holding for repeat ETOH level and repeat troponin at 4:30pm.  Sign out received from Dr Darl Householder.  Patient with hx CABG with significant stress currently, drank ETOH for stress relief, developed CP walking down stairs.  Came to ED for BP check.  Discussed CXR with Dr Darl Householder - he notes he is not clinically concerned about pneumonia given presentation.  Anticipate d/c home after testing.    3:06 PM Pt signed out to Domenic Moras, PA-C, who assumes care of patient on his arrival to CDU.    [place], [place] 11/11/12 [phone]

## 2012-11-11 NOTE — ED Provider Notes (Signed)
Medical screening examination/treatment/procedure(s) were performed by non-physician practitioner and as supervising physician I was immediately available for consultation/collaboration.   Wandra Arthurs, MD 11/11/12 [phone]

## 2012-11-11 NOTE — ED Provider Notes (Signed)
Medical screening examination/treatment/procedure(s) were performed by non-physician practitioner and as supervising physician I was immediately available for consultation/collaboration.  Barbara Cower, MD 11/11/12 2052

## 2012-11-11 NOTE — ED Notes (Signed)
Pt states last drink was this morning around 10 am. Pt states he had about a half pint of gin this morning before coming into the ED.

## 2012-11-11 NOTE — ED Notes (Signed)
Pt denies chest pain; pt states shortness of breath; pt states has a cold; pt denies nausea and dizziness currently

## 2012-11-11 NOTE — ED Notes (Signed)
Dr. Yao at bedside. 

## 2012-11-11 NOTE — ED Notes (Signed)
+  etoh, pt reports coming to visit family member, had increase in stress level and started having mid chest pain, also wants to have his bp checked. No acute distress noted at triage.

## 2012-11-11 NOTE — ED Notes (Signed)
Pt returned from [place]. Pt placed back on monitors.

## 2013-11-11 ENCOUNTER — Ambulatory Visit (INDEPENDENT_AMBULATORY_CARE_PROVIDER_SITE_OTHER): Payer: Commercial Managed Care - HMO | Admitting: Internal Medicine

## 2013-11-11 ENCOUNTER — Encounter: Payer: Self-pay | Admitting: Internal Medicine

## 2013-11-11 ENCOUNTER — Ambulatory Visit: Payer: Commercial Managed Care - HMO | Admitting: Dietician

## 2013-11-11 VITALS — BP 157/87 | HR 94 | Temp 98.6°F | Ht 68.5 in | Wt 226.1 lb

## 2013-11-11 DIAGNOSIS — Z0000 Encounter for general adult medical examination without abnormal findings: Secondary | ICD-10-CM

## 2013-11-11 DIAGNOSIS — F1010 Alcohol abuse, uncomplicated: Secondary | ICD-10-CM

## 2013-11-11 DIAGNOSIS — D649 Anemia, unspecified: Secondary | ICD-10-CM

## 2013-11-11 DIAGNOSIS — Z1211 Encounter for screening for malignant neoplasm of colon: Secondary | ICD-10-CM

## 2013-11-11 DIAGNOSIS — I2510 Atherosclerotic heart disease of native coronary artery without angina pectoris: Secondary | ICD-10-CM

## 2013-11-11 DIAGNOSIS — I8290 Acute embolism and thrombosis of unspecified vein: Secondary | ICD-10-CM

## 2013-11-11 DIAGNOSIS — F17200 Nicotine dependence, unspecified, uncomplicated: Secondary | ICD-10-CM

## 2013-11-11 DIAGNOSIS — I10 Essential (primary) hypertension: Secondary | ICD-10-CM

## 2013-11-11 DIAGNOSIS — E785 Hyperlipidemia, unspecified: Secondary | ICD-10-CM

## 2013-11-11 DIAGNOSIS — E7800 Pure hypercholesterolemia, unspecified: Secondary | ICD-10-CM

## 2013-11-11 DIAGNOSIS — H0520 Unspecified exophthalmos: Secondary | ICD-10-CM

## 2013-11-11 DIAGNOSIS — M109 Gout, unspecified: Secondary | ICD-10-CM

## 2013-11-11 DIAGNOSIS — Z23 Encounter for immunization: Secondary | ICD-10-CM

## 2013-11-11 DIAGNOSIS — E119 Type 2 diabetes mellitus without complications: Secondary | ICD-10-CM

## 2013-11-11 DIAGNOSIS — N400 Enlarged prostate without lower urinary tract symptoms: Secondary | ICD-10-CM

## 2013-11-11 DIAGNOSIS — Z86718 Personal history of other venous thrombosis and embolism: Secondary | ICD-10-CM | POA: Insufficient documentation

## 2013-11-11 HISTORY — DX: Acute embolism and thrombosis of unspecified vein: I82.90

## 2013-11-11 LAB — COMPLETE METABOLIC PANEL WITH GFR
ALT: 13 U/L (ref 0–53)
AST: 20 U/L (ref 0–37)
Alkaline Phosphatase: 111 U/L (ref 39–117)
CO2: 27 mEq/L (ref 19–32)
Creat: 1.51 mg/dL — ABNORMAL HIGH (ref 0.50–1.35)
GFR, Est African American: 54 mL/min — ABNORMAL LOW
Sodium: 140 mEq/L (ref 135–145)
Total Bilirubin: 0.6 mg/dL (ref 0.3–1.2)
Total Protein: 6.6 g/dL (ref 6.0–8.3)

## 2013-11-11 LAB — CBC WITH DIFFERENTIAL/PLATELET
Basophils Absolute: 0 10*3/uL (ref 0.0–0.1)
Basophils Relative: 0 % (ref 0–1)
Eosinophils Absolute: 0 10*3/uL (ref 0.0–0.7)
MCH: 28.3 pg (ref 26.0–34.0)
MCHC: 34 g/dL (ref 30.0–36.0)
Neutro Abs: 3.1 10*3/uL (ref 1.7–7.7)
Neutrophils Relative %: 57 % (ref 43–77)
RDW: 18.5 % — ABNORMAL HIGH (ref 11.5–15.5)

## 2013-11-11 LAB — LIPID PANEL
Cholesterol: 155 mg/dL (ref 0–200)
HDL: 89 mg/dL (ref >39)
LDL Cholesterol: 60 mg/dL (ref 0–99)
Total CHOL/HDL Ratio: 1.7 Ratio
Triglycerides: 32 mg/dL (ref <150)
VLDL: 6 mg/dL (ref 0–40)

## 2013-11-11 LAB — POCT GLYCOSYLATED HEMOGLOBIN (HGB A1C): Hemoglobin A1C: 6.2

## 2013-11-11 LAB — GLUCOSE, CAPILLARY: Glucose-Capillary: 96 mg/dL (ref 70–99)

## 2013-11-11 MED ORDER — DOXAZOSIN MESYLATE 8 MG PO TABS: 8.0000 mg | ORAL_TABLET | Freq: Every day | ORAL | Status: DC

## 2013-11-11 MED ORDER — COLCHICINE 0.6 MG PO TABS
0.6000 mg | ORAL_TABLET | Freq: Every day | ORAL | Status: DC
Start: 2013-11-11 — End: 2013-12-04

## 2013-11-11 MED ORDER — FOLIC ACID 1 MG PO TABS: 1.0000 mg | ORAL_TABLET | Freq: Every day | ORAL | Status: DC

## 2013-11-11 MED ORDER — AMLODIPINE BESYLATE 10 MG PO TABS: 10.0000 mg | ORAL_TABLET | Freq: Every day | ORAL | Status: DC

## 2013-11-11 MED ORDER — ROSUVASTATIN CALCIUM 10 MG PO TABS: 10.0000 mg | ORAL_TABLET | Freq: Every day | ORAL | Status: DC

## 2013-11-11 MED ORDER — METFORMIN HCL 1000 MG PO TABS: 1000.0000 mg | ORAL_TABLET | Freq: Two times a day (BID) | ORAL | Status: DC

## 2013-11-11 MED ORDER — VITAMIN B-1 100 MG PO TABS: 100.0000 mg | ORAL_TABLET | Freq: Every day | ORAL | Status: DC

## 2013-11-11 MED ORDER — METOPROLOL SUCCINATE ER 50 MG PO TB24: 50.0000 mg | ORAL_TABLET | Freq: Every day | ORAL | Status: DC

## 2013-11-11 MED ORDER — ALLOPURINOL 300 MG PO TABS: 300.0000 mg | ORAL_TABLET | Freq: Every day | ORAL | Status: DC

## 2013-11-11 NOTE — Patient Instructions (Addendum)
General Instructions: -You diabetes is well controlled. Continue taking metformin and eating a healthy diet.  -Start taking thiamine and folate. These are vitamins that you may purchase these without a prescription. They will help with your memory.  -Follow the instructions in the stool card and send the samples back to [place]. This is to check or screen you for colon cancer.  -You will be contacted with appointments for the foot specialist.  -You have been referred to the eye doctor. Because you have diabetes, it is important that you have an eye exam at least once per year.  -Follow up with Dr. Elie Confer in the Warfarin clinic on Monday.  -Follow up with me in 3-4 weeks.  -It was great meeting you today! Have a wonderful Thanksgiving!    Treatment Goals:  Goals (1 Years of Data) as of 11/11/13         As of Today 11/11/12 11/11/12 11/11/12 11/11/12     Blood Pressure    . Blood Pressure < 140/90  157/87 187/87 159/80 153/80 126/93     Lifestyle    . Quit smoking / using tobacco  No         Result Component    . HEMOGLOBIN A1C < 7.0  6.2        . LDL CALC < 70            Progress Toward Treatment Goals:  No flowsheet data found.  Self Care Goals & Plans:  Self Care Goal 11/11/2013  Manage my medications take my medicines as prescribed; bring my medications to every visit; refill my medications on time  Monitor my health check my feet daily; keep track of my blood glucose; bring my glucose meter and log to each visit  Eat healthy foods eat foods that are low in salt; eat baked foods instead of fried foods  Be physically active take a walk every day  Stop smoking go to the [employer] website (https://scott-booker.info/); cut down the number of cigarettes smoked    No flowsheet data found.   Care Management & Community Referrals:  No flowsheet data found.

## 2013-11-12 ENCOUNTER — Encounter: Payer: Self-pay | Admitting: Internal Medicine

## 2013-11-12 DIAGNOSIS — D509 Iron deficiency anemia, unspecified: Secondary | ICD-10-CM | POA: Insufficient documentation

## 2013-11-12 DIAGNOSIS — N183 Chronic kidney disease, stage 3 unspecified: Secondary | ICD-10-CM

## 2013-11-12 DIAGNOSIS — Z0000 Encounter for general adult medical examination without abnormal findings: Secondary | ICD-10-CM | POA: Insufficient documentation

## 2013-11-12 DIAGNOSIS — N185 Chronic kidney disease, stage 5: Secondary | ICD-10-CM | POA: Insufficient documentation

## 2013-11-12 DIAGNOSIS — D649 Anemia, unspecified: Secondary | ICD-10-CM

## 2013-11-12 HISTORY — DX: Anemia, unspecified: D64.9

## 2013-11-12 HISTORY — DX: Chronic kidney disease, stage 3 unspecified: N18.30

## 2013-11-12 LAB — TSH: TSH: 1.168 u[IU]/mL (ref 0.350–4.500)

## 2013-11-12 NOTE — Assessment & Plan Note (Addendum)
Baseline Hg fluctuates between 10-12. CBC during this visit with normal CBC and Hg of 10.0. He reports dark stools when he eats certain foods but he denies hematochezia. He has never had a colonoscopy and declines one at this time. Anemia of chronic disease, alcohol abuse, or iron def also possible.  Home stool cards given.  -Will order anemia panel.

## 2013-11-12 NOTE — Assessment & Plan Note (Signed)
No recent flare up, he states that he had great toe swelling years ago. He is on allopurinol 300mg  daily and Colcrys 0.6mg  daily.

## 2013-11-12 NOTE — Assessment & Plan Note (Signed)
Checked lipid profile during this visit with HDL of 89, LDL of 60. Continue Crestor 10mg  daily.

## 2013-11-12 NOTE — Assessment & Plan Note (Addendum)
Lab Results  Component Value Date   HGBA1C 6.2 11/11/2013   HGBA1C  Value: 7.6 (NOTE)                                                                       According to the ADA Clinical Practice Recommendations for 2011, when HbA1c is used as a screening test:   >=6.5%   Diagnostic of Diabetes Mellitus           (if abnormal result  is confirmed)  5.7-6.4%   Increased risk of developing Diabetes Mellitus  References:Diagnosis and Classification of Diabetes Mellitus,Diabetes [id number] 1):S62-S69 and Standards of Medical Care in         Diabetes - 2011,Diabetes [id number]  (Suppl 1):S11-S61.* 02/11/2011   HGBA1C  Value: 9.9 (NOTE) The ADA recommends the following therapeutic goal for glycemic control related to Hgb A1c measurement: Goal of therapy: <6.5 Hgb A1c  Reference: American Diabetes Association: Clinical Practice Recommendations 2010, Diabetes Care, 2010, 33: (Suppl  1).* 02/07/2010     Assessment: Diabetes control:   Controlled Progress toward A1C goal:   At goal Comments: He is on metformin 1000mg  BID.   Plan: Medications:  continue current medications Home glucose monitoring: Frequency:   Timing:   Instruction/counseling given: reminded to get eye exam, discussed foot care.  Educational resources provided: brochure Self management tools provided: home glucose logbook Other plans: Foot exam done during this visit. He has very long toe nails with no feet ulcers. He is interested in meeting with the diabetes educator and will make an appointment for this week.  -Referred to Podiatry.  -Referred to Ophthalmology for diabetic eye exam and examination of exophthalmos (normal TSH).

## 2013-11-12 NOTE — Assessment & Plan Note (Signed)
Will request records from his former PCP and his Cardiologist.

## 2013-11-12 NOTE — Progress Notes (Signed)
  Subjective:    Patient ID: Kevin Castillo, male    DOB: 1946-03-23, 67 y.o.   MRN: [id number]  Diabetes Pertinent negatives for hypoglycemia include no confusion, dizziness, headaches, pallor, speech difficulty or tremors. Pertinent negatives for diabetes include no chest pain, no fatigue, no polyuria and no weakness.   Kevin Castillo is a 67 year old man with PMH of HTN, DM2, CAD, history of DVT and PE, on coumadin, who presents to the [facility] to establish care. He is a former patient of Dr. Remigio Eisenmenger but has not been seen in that clinic for almost one year. His Cardiologist, Dr. Doylene Canard, has been checking his INR but he has not been seen there in over one month. He has had cold symptoms since last week but this is improving. He has no complaints during this visit.    Review of Systems  Constitutional: Negative for fever, chills, diaphoresis, activity change, appetite change, fatigue and unexpected weight change.  HENT: Negative for congestion, ear pain, postnasal drip, rhinorrhea, sinus pressure and sore throat.   Respiratory: Positive for cough. Negative for shortness of breath and wheezing.   Cardiovascular: Negative for chest pain, palpitations and leg swelling.  Gastrointestinal: Negative for nausea, vomiting, abdominal pain, diarrhea, constipation and blood in stool.       He has had dark stools after eating certain foods such as liver pudding or collard greens.   Endocrine: Negative for polyuria.  Genitourinary: Negative for dysuria and frequency.  Musculoskeletal: Negative for back pain.  Skin: Negative for color change, pallor, rash and wound.       Bilateral long toe nails with skin irritation of his "little" toes.   Neurological: Negative for dizziness, tremors, syncope, speech difficulty, weakness, light-headedness and headaches.  Psychiatric/Behavioral: Negative for behavioral problems, confusion and agitation.       Objective:   Physical Exam  Nursing note and vitals  reviewed. Constitutional: He is oriented to person, place, and time. He appears well-developed and well-nourished. No distress.  HENT:  Head: Normocephalic and atraumatic.  Mouth/Throat: Oropharynx is clear and moist. No oropharyngeal exudate.  Poor dentition with several missing teeth.   Eyes: Conjunctivae and EOM are normal. Pupils are equal, round, and reactive to light. Right eye exhibits no discharge. Left eye exhibits no discharge. No scleral icterus.  Bilateral exophthalmos with prominence of left lacrimal gland.   Neck: Neck supple. No JVD present. No thyromegaly present.  Cardiovascular: Normal rate and regular rhythm.   Murmur heard. 2/6 SEM heard best at the RUSB  Pulmonary/Chest: Effort normal. No respiratory distress. He has no wheezes. He has no rales.  Occasional expiratory rhonchi   Abdominal: Soft. Bowel sounds are normal. He exhibits no distension and no mass. There is no tenderness. There is no rebound and no guarding.  Abdomen obese. Unable to palpate liver edge.   Musculoskeletal: Normal range of motion. He exhibits no edema and no tenderness.  Neurological: He is alert and oriented to person, place, and time. No cranial nerve deficit. Coordination normal.  Skin: Skin is warm and dry. No rash noted. He is not diaphoretic. No erythema. No pallor.  Overgrown toenails bilaterally that curl under the toes. No skin breakage or ulcer of his feet bilaterally but several areas of calluses and dry skin.   Psychiatric: He has a normal mood and affect.          Assessment & Plan:

## 2013-11-12 NOTE — Assessment & Plan Note (Signed)
He received the flu vaccine during this visit.  He does not want a colonoscopy at this time but agreed to stool cards. He is due for Pneumovax, Tdap, and urine microalbumin/Cr ration, will address these during his next visit.

## 2013-11-12 NOTE — Assessment & Plan Note (Addendum)
He states that he has been on warfarin since his DVT in 2012. INR has been checked by his Cardiologist recently. He takes warfarin 10mg  tablet once daily. His POC INR is 2.2 during this visit. Briefly discussed case with Dr. Elie Confer at the coumadin clinic who agrees to see the patient on Monday, Dec 1st. Pt understands that he has appointment with the [facility] coumadin clinic on Monday.

## 2013-11-12 NOTE — Assessment & Plan Note (Signed)
Recent baseline Cr is unknown but his Serum Cr has fluctuate from 1.3-1.0 in the past 2 years. CMP during this visit concerning from rise in Cr to 1.5.  Will ask pt to return for repeat BMET.

## 2013-11-12 NOTE — Assessment & Plan Note (Signed)
BP Readings from Last 3 Encounters:  11/11/13 157/87  11/11/12 187/87    Lab Results  Component Value Date   NA 140 11/11/2013   K 3.4* 11/11/2013   CREATININE 1.51* 11/11/2013    Assessment: Blood pressure control:  BP mildly elevated  Progress toward BP goal:   Unchanged.  Comments: He is on Norvasc 10mg , toprol 50mg  XL daily.   Plan: Medications:  continue current medications Educational resources provided: brochure Self management tools provided: home blood pressure logbook Other plans: Pt thinks he may have skipped one of his antihypertensives this morning. BP recheck, follow up visit in 2-3 weeks.

## 2013-11-12 NOTE — Assessment & Plan Note (Signed)
  Assessment: Progress toward smoking cessation:   NO change Barriers to progress toward smoking cessation:   NA Comments: Not ready to quit.   Plan: Instruction/counseling given:  I counseled patient on the dangers of tobacco use, advised patient to stop smoking, and reviewed strategies to maximize success. Educational resources provided:  [employer] account manager) brochure Self management tools provided:  smoking cessation plan (STAR Quit Plan) Medications to assist with smoking cessation:  None Patient agreed to the following self-care plans for smoking cessation: go to the [employer] (www.quitlinenc.com);cut down the number of cigarettes smoked  Other plans: Will continue providing smoking cessation counseling during next visit.

## 2013-11-12 NOTE — Assessment & Plan Note (Signed)
Checked CMP during this visit, LFTs normal. Advised pt to take folate and thiamine daily.

## 2013-11-13 ENCOUNTER — Other Ambulatory Visit: Payer: Self-pay | Admitting: Internal Medicine

## 2013-11-13 DIAGNOSIS — E119 Type 2 diabetes mellitus without complications: Secondary | ICD-10-CM

## 2013-11-13 DIAGNOSIS — N189 Chronic kidney disease, unspecified: Secondary | ICD-10-CM

## 2013-11-13 DIAGNOSIS — D649 Anemia, unspecified: Secondary | ICD-10-CM

## 2013-11-13 DIAGNOSIS — N289 Disorder of kidney and ureter, unspecified: Secondary | ICD-10-CM

## 2013-11-13 NOTE — Progress Notes (Signed)
Called pt to discuss results of his labs, concerning for his kidney function. He states that he is not sick, with normal appetite but had not been drinking much water recently. He denied N/V/D.  He agrees to return on Monday for repeat labs.   Placed future order for BMP, UA, Micr/Cr, and anemia panel.

## 2013-11-13 NOTE — Progress Notes (Signed)
Case discussed with Dr. Kennerly soon after the resident saw the patient.  We reviewed the resident's history and exam and pertinent patient test results.  I agree with the assessment, diagnosis, and plan of care documented in the resident's note. 

## 2013-11-18 ENCOUNTER — Other Ambulatory Visit (INDEPENDENT_AMBULATORY_CARE_PROVIDER_SITE_OTHER): Payer: Medicare HMO

## 2013-11-18 ENCOUNTER — Ambulatory Visit (INDEPENDENT_AMBULATORY_CARE_PROVIDER_SITE_OTHER): Payer: Commercial Managed Care - HMO | Admitting: Pharmacist

## 2013-11-18 ENCOUNTER — Ambulatory Visit (INDEPENDENT_AMBULATORY_CARE_PROVIDER_SITE_OTHER): Payer: Commercial Managed Care - HMO | Admitting: Dietician

## 2013-11-18 ENCOUNTER — Encounter: Payer: Self-pay | Admitting: Dietician

## 2013-11-18 DIAGNOSIS — D649 Anemia, unspecified: Secondary | ICD-10-CM

## 2013-11-18 DIAGNOSIS — N189 Chronic kidney disease, unspecified: Secondary | ICD-10-CM

## 2013-11-18 DIAGNOSIS — Z86711 Personal history of pulmonary embolism: Secondary | ICD-10-CM

## 2013-11-18 DIAGNOSIS — Z1211 Encounter for screening for malignant neoplasm of colon: Secondary | ICD-10-CM

## 2013-11-18 DIAGNOSIS — E119 Type 2 diabetes mellitus without complications: Secondary | ICD-10-CM

## 2013-11-18 DIAGNOSIS — Z7901 Long term (current) use of anticoagulants: Secondary | ICD-10-CM

## 2013-11-18 LAB — BASIC METABOLIC PANEL WITH GFR
CO2: 27 mEq/L (ref 19–32)
Calcium: 8.6 mg/dL (ref 8.4–10.5)
Creat: 1.32 mg/dL (ref 0.50–1.35)
GFR, Est African American: 64 mL/min
Glucose, Bld: 145 mg/dL — ABNORMAL HIGH (ref 70–99)
Sodium: 140 mEq/L (ref 135–145)

## 2013-11-18 LAB — ANEMIA PANEL 7
%SAT: 19 % — ABNORMAL LOW (ref 20–55)
ABS Retic: 56 10*3/uL (ref 19.0–186.0)
Folate: 14.9 ng/mL
HCT: 29.2 % — ABNORMAL LOW (ref 39.0–52.0)
Iron: 67 ug/dL (ref 42–165)
MCH: 28.9 pg (ref 26.0–34.0)
MCHC: 34.6 g/dL (ref 30.0–36.0)
MCV: 83.4 fL (ref 78.0–100.0)
RBC: 3.5 MIL/uL — ABNORMAL LOW (ref 4.22–5.81)
RDW: 18.2 % — ABNORMAL HIGH (ref 11.5–15.5)
TIBC: 345 ug/dL (ref 215–435)
UIBC: 278 ug/dL (ref 125–400)
Vitamin B-12: 824 pg/mL (ref 211–911)
WBC: 5.2 10*3/uL (ref 4.0–10.5)

## 2013-11-18 LAB — POC HEMOCCULT BLD/STL (HOME/3-CARD/SCREEN)
Card #2 Fecal Occult Blod, POC: NEGATIVE
Card #3 Fecal Occult Blood, POC: NEGATIVE

## 2013-11-18 LAB — HM DIABETES EYE EXAM

## 2013-11-18 MED ORDER — WARFARIN SODIUM 10 MG PO TABS: 10.0000 mg | ORAL_TABLET | Freq: Every day | ORAL | Status: DC

## 2013-11-18 NOTE — Patient Instructions (Signed)
Patient instructed to take medications as defined in the Anti-coagulation Track section of this encounter.  Patient instructed to take today's dose.  Patient verbalized understanding of these instructions.    

## 2013-11-18 NOTE — Progress Notes (Signed)
Anti-Coagulation Progress Note  Kevin Castillo is a 67 y.o. male who is currently on an anti-coagulation regimen.    RECENT RESULTS: Recent results are below, the most recent result is correlated with a dose of 70 mg. per week: Lab Results  Component Value Date   INR 2.30 11/18/2013   INR 2.2 11/11/2013   INR 2.73* 02/21/2011    ANTI-COAG DOSE: Anticoagulation Dose Instructions as of 11/18/2013     Dorene Grebe Tue Wed Thu Fri Sat   New Dose 10 mg 10 mg 10 mg 10 mg 10 mg 10 mg 10 mg       ANTICOAG SUMMARY: Anticoagulation Episode Summary   Current INR goal 2.0-3.0  Next INR check 12/16/2013  INR from last check 2.30 (11/18/2013)  Weekly max dose   Target end date   INR check location [facility]  Preferred lab   Send INR reminders to    Indications  History of pulmonary embolus (PE) [V12.55] Long term (current) use of anticoagulants [V58.61]        Comments         ANTICOAG TODAY: Anticoagulation Summary as of 11/18/2013   INR goal 2.0-3.0  Selected INR 2.30 (11/18/2013)  Next INR check 12/16/2013  Target end date    Indications  History of pulmonary embolus (PE) [V12.55] Long term (current) use of anticoagulants [V58.61]      Anticoagulation Episode Summary   INR check location [facility]   Preferred lab    Send INR reminders to    Comments       PATIENT INSTRUCTIONS: Patient Instructions  Patient instructed to take medications as defined in the Anti-coagulation Track section of this encounter.  Patient instructed to take today's dose.  Patient verbalized understanding of these instructions.       FOLLOW-UP Return in about 4 weeks (around 12/16/2013) for Follow up INR at 1045h.  Jorene Guest, III Pharm.D., CACP

## 2013-11-18 NOTE — Patient Instructions (Addendum)
Follow up in 1 year or sooner if desired.  Let me know if you want to go to diabetes classes.  You may want to try eating a bowl of cereal in the moring with your coffee.(bran flakes, wheaties or shredded wheat mixed with corn flakes is healthy)  Please eat less sugar= try to drink tea with less or no sugar- can sweeten it with artificial sweetener.   Please eat more vegetables = greens, carrots, cabbage, green beans, broccoli and cucumbers  than bread and starchy foods.

## 2013-11-18 NOTE — Progress Notes (Signed)
agree

## 2013-11-18 NOTE — Progress Notes (Signed)
Medical Nutrition Therapy:  Appt start time: 1100 end time:  1130.  Assessment:  Primary concerns today: Annual Review. - Dx in 1994 and reports no previous formal education for diabetes/ meal planning.  Usual physical activity includes sedentary activities due to leg pain with exertion. He reports checking feet most days and no problems currently.  Self monitoring- discussed that if he and his doctor decide he needs to check blood sugars at home, he'd like new meter Medication- knows names, time and doses of diabetes medicine. Report missing doses about 3 days a week.  Usual eating pattern includes 3 meals and 1 snacks per day. Frequent foods include almond joy, sweet tea, whole milk, coffee.  Avoided foods include fried foods.   24-hr recall: (Up at 5-6 AM) B (  6 AM)-  Drink ~ 6 cups coffee and sometimes orange juice  L (11am-2 PM)- 2 eggs, 2 toast and sausage or chicken patty sandwich and slaw, almond joy candy, sweet tea or water  Snk ( PM)- pack of "nab" crackers  D ( PM)- baked chicken or pork chop. Stewed beef, mashed potatoes, 4 slices bread, greens or green beans sweet tea  Snk ( PM)- an orange or cornflakes with whole milk   Progress Towards Goal(s):  In progress.   Nutritional Diagnosis:  NB-1.1 Food and nutrition-related knowledge deficit as related ot lack of prior exposure to diabetes meal planning as evidenced by his report NB-1.3 Not ready for intensive diet/lifestyle change  As related to lack of motivation to make a change.  As evidenced by his actions and hesitancy to follow up.    Intervention:  Nutrition education about how to make healthier choicessuch as less sugar and more vegetables. Encouraged gradual increase in physical activity as able. Consider a multivitamin with B12 in it.  Coordination of care- encouraged him to discuss self monitoring of blood glucose at hoem with physcian. If needed CDE can assist pt with obtaining meter.  Monitoring/Evaluation:  Dietary  intake, exercise, blood sugars, and body weight in 6-12 month(s).

## 2013-11-19 LAB — URINALYSIS, ROUTINE W REFLEX MICROSCOPIC
Bilirubin Urine: NEGATIVE
Glucose, UA: NEGATIVE mg/dL
Protein, ur: >300 mg/dL — AB
Urobilinogen, UA: 0.2 mg/dL (ref 0.0–1.0)

## 2013-11-19 LAB — URINALYSIS, MICROSCOPIC ONLY
Crystals: NONE SEEN
Squamous Epithelial / LPF: NONE SEEN

## 2013-11-19 LAB — MICROALBUMIN / CREATININE URINE RATIO: Microalb, Ur: 318.66 mg/dL — ABNORMAL HIGH (ref 0.00–1.89)

## 2013-11-19 NOTE — Addendum Note (Signed)
Addended by: Marcelino Duster on: 11/19/2013 01:57 PM   Modules accepted: Orders

## 2013-12-02 ENCOUNTER — Ambulatory Visit (INDEPENDENT_AMBULATORY_CARE_PROVIDER_SITE_OTHER): Payer: Medicare HMO | Admitting: Internal Medicine

## 2013-12-02 ENCOUNTER — Encounter: Payer: Self-pay | Admitting: Internal Medicine

## 2013-12-02 VITALS — BP 131/69 | HR 82 | Temp 98.0°F | Wt 225.0 lb

## 2013-12-02 DIAGNOSIS — E119 Type 2 diabetes mellitus without complications: Secondary | ICD-10-CM

## 2013-12-02 DIAGNOSIS — Z7901 Long term (current) use of anticoagulants: Secondary | ICD-10-CM

## 2013-12-02 DIAGNOSIS — D649 Anemia, unspecified: Secondary | ICD-10-CM

## 2013-12-02 DIAGNOSIS — N400 Enlarged prostate without lower urinary tract symptoms: Secondary | ICD-10-CM

## 2013-12-02 DIAGNOSIS — Z86711 Personal history of pulmonary embolism: Secondary | ICD-10-CM

## 2013-12-02 DIAGNOSIS — E785 Hyperlipidemia, unspecified: Secondary | ICD-10-CM

## 2013-12-02 DIAGNOSIS — I10 Essential (primary) hypertension: Secondary | ICD-10-CM

## 2013-12-02 DIAGNOSIS — Z23 Encounter for immunization: Secondary | ICD-10-CM

## 2013-12-02 DIAGNOSIS — F1010 Alcohol abuse, uncomplicated: Secondary | ICD-10-CM

## 2013-12-02 DIAGNOSIS — I2510 Atherosclerotic heart disease of native coronary artery without angina pectoris: Secondary | ICD-10-CM

## 2013-12-02 DIAGNOSIS — M109 Gout, unspecified: Secondary | ICD-10-CM

## 2013-12-02 MED ORDER — LISINOPRIL 20 MG PO TABS: 20.0000 mg | ORAL_TABLET | Freq: Every day | ORAL | Status: DC

## 2013-12-02 NOTE — Patient Instructions (Signed)
General Instructions: -Stop taking allopurinol, amlodipine, and colchicine.  -Start taking Lisinopril.  -Follow up with your Cardiologist.  -Follow up with [place] in one week.    Treatment Goals:  Goals (1 Years of Data) as of 12/02/13         As of Today 11/11/13 11/11/12 02/11/11     Blood Pressure    . Blood Pressure < 140/90  131/69 157/87 187/87      Lifestyle    . Quit smoking / using tobacco   No       Result Component    . HEMOGLOBIN A1C < 7.0   6.2  7.6...    . LDL CALC < 70   60  106       ...      Progress Toward Treatment Goals:  Treatment Goal 12/02/2013  Hemoglobin A1C at goal  Blood pressure at goal  Stop smoking unable to assess    Self Care Goals & Plans:  Self Care Goal 12/02/2013  Manage my medications refill my medications on time; bring my medications to every visit; take my medicines as prescribed  Monitor my health -  Eat healthy foods -  Be physically active -  Stop smoking -  Meeting treatment goals maintain the current self-care plan    No flowsheet data found.   Care Management & Community Referrals:  No flowsheet data found.

## 2013-12-04 MED ORDER — ROSUVASTATIN CALCIUM 10 MG PO TABS: 10.0000 mg | ORAL_TABLET | Freq: Every day | ORAL | Status: DC

## 2013-12-04 MED ORDER — VITAMIN B-1 100 MG PO TABS: 100.0000 mg | ORAL_TABLET | Freq: Every day | ORAL | Status: DC

## 2013-12-04 MED ORDER — DOXAZOSIN MESYLATE 8 MG PO TABS: 8.0000 mg | ORAL_TABLET | Freq: Every day | ORAL | Status: DC

## 2013-12-04 MED ORDER — METOPROLOL SUCCINATE ER 50 MG PO TB24: 50.0000 mg | ORAL_TABLET | Freq: Every day | ORAL | Status: DC

## 2013-12-04 MED ORDER — WARFARIN SODIUM 10 MG PO TABS: 10.0000 mg | ORAL_TABLET | Freq: Every day | ORAL | Status: DC

## 2013-12-04 MED ORDER — FOLIC ACID 1 MG PO TABS: 1.0000 mg | ORAL_TABLET | Freq: Every day | ORAL | Status: DC

## 2013-12-04 MED ORDER — METFORMIN HCL 1000 MG PO TABS: 1000.0000 mg | ORAL_TABLET | Freq: Two times a day (BID) | ORAL | Status: DC

## 2013-12-04 MED ORDER — LISINOPRIL 20 MG PO TABS: 20.0000 mg | ORAL_TABLET | Freq: Every day | ORAL | Status: DC

## 2013-12-04 NOTE — Assessment & Plan Note (Signed)
He was encouraged again to continue taking folic acid and thiamine as long as he still drinks alcohol.

## 2013-12-04 NOTE — Assessment & Plan Note (Signed)
Will continue metformin for now but he will need repeat BMET in 3-6 months, if renal function declines, he will need to stop taking this medication.

## 2013-12-04 NOTE — Assessment & Plan Note (Addendum)
He states that he has had right calf swelling off and on. Per Dr. Doylene Canard, plan was to keep pt on warfarin for 1 year but has been on it for 2 years now.This was his first DVT/PE which was unprovoked. Limited studies are available in regards to recurrence of first and unprovoked DVT/PE but a rate os recurrence as high as 25% has been reported.  Pt has no melena, no signs of bleeding and wants to stay on coumadin at this time. He has followed up with Dr. Elie Confer who will continue monitoring his INR and warfarin dose.  If the patient changes his mid and is willing to stop warfarin, a D-dimer prior to discontinuation of tx and 30 days thereafter could be checked for monitoring of DVT/PE recurrence.  Hypercoagulable panel could also be checked 6 weeks after discontinuation of warfarin for further eval of risk of recurrence.

## 2013-12-04 NOTE — Assessment & Plan Note (Addendum)
Anemia panel with surprisingly normal iron, B12, and folate. Home stool cards negative for occult GI bleeding. Anemia of chronic disease possible but spherocytosis and thalassemia also possible.  Pt wants to defer referral to Nephrology at this time.

## 2013-12-04 NOTE — Assessment & Plan Note (Signed)
Referred to Cardiology, he will see Dr. Doylene Canard and discuss need for cardiac stress test.

## 2013-12-04 NOTE — Assessment & Plan Note (Addendum)
Repeat BMET after last visit with Creatinine down to 1.31 which is most likely his baseline Cr. He was encouraged to stay hydrated.  Has CKD 3 stage B and will need referral to Nephrology during his next visit (he wanted to wait until his next visit for this referral as he wants to see his Cardiologist first).  Will start Lisinopril 20mg  daily with BP recheck in 1 week (stopped amlodipine).

## 2013-12-04 NOTE — Progress Notes (Signed)
Case discussed with Dr. Kennerly at the time of the visit.  We reviewed the resident's history and exam and pertinent patient test results.  I agree with the assessment, diagnosis, and plan of care documented in the resident's note. 

## 2013-12-04 NOTE — Assessment & Plan Note (Signed)
He actually denies history of gout stating that the only time he had pain in his joints was when he was diagnosed with his blood clot. Given his CKD and not confirmed hx of gout, pt wants to discontinue allopurinol and colchicine. Will continue monitoring for s/s of gout.

## 2013-12-04 NOTE — Progress Notes (Signed)
   Subjective:    Patient ID: Kevin Castillo, male    DOB: 1946/03/14, 67 y.o.   MRN: [id number]  HPI Kevin Castillo is a 67 year old man with PMH of CKD, CAD s/p CABG, HTN, DM2, and history of DVT and PE on coumadin, who presents for follow up visit for his hypertension.  He has no new complaints.  Just prior to this visit, I was able to talk on the phone with his Cardiologist, Dr. Doylene Canard, who recommends that the patient have a nuclear stress test yearly as part of post-CABG management.  Kevin Castillo states that he missed his appointment last month with Dr. Doylene Canard but wants to schedule a follow up appointment. He denies chest pain, arm numbness/tingling, or dyspnea.     Review of Systems  Constitutional: Negative for fever, chills, diaphoresis, activity change, appetite change and fatigue.  HENT: Negative for congestion and rhinorrhea.   Respiratory: Negative for cough, shortness of breath and wheezing.   Cardiovascular: Negative for chest pain, palpitations and leg swelling.  Gastrointestinal: Negative for abdominal pain.  Genitourinary: Negative for frequency and decreased urine volume.  Skin: Negative for color change, pallor, rash and wound.  Neurological: Negative for dizziness, tremors, syncope, weakness, light-headedness, numbness and headaches.  Psychiatric/Behavioral: Negative for behavioral problems and agitation.       Objective:   Physical Exam  Nursing note and vitals reviewed. Constitutional: He is oriented to person, place, and time. He appears well-developed and well-nourished. No distress.  Nasal sounding speech with mild dysarthria as his baseline.  HENT:  Head: Normocephalic and atraumatic.  Eyes: Conjunctivae are normal. No scleral icterus.  Cardiovascular: Normal rate and regular rhythm.   Pulmonary/Chest: Effort normal. No respiratory distress. He has no wheezes. He has no rales.  Abdominal: Soft.  Musculoskeletal: He exhibits no edema and no tenderness.    Neurological: He is alert and oriented to person, place, and time. No cranial nerve deficit. Coordination normal.  Skin: Skin is warm and dry. No rash noted. He is not diaphoretic. No erythema. No pallor.  Psychiatric: He has a normal mood and affect. His behavior is normal.          Assessment & Plan:

## 2013-12-04 NOTE — Assessment & Plan Note (Signed)
BP Readings from Last 3 Encounters:  12/02/13 131/69  11/11/13 157/87  11/11/12 187/87    Lab Results  Component Value Date   NA 140 11/18/2013   K 3.6 11/18/2013   CREATININE 1.32 11/18/2013    Assessment: Blood pressure control: controlled Progress toward BP goal:  at goal Comments: He is on Norvasc 10mg  daily, metoprolol 50mg  daily.   Plan: Medications:  will discontinue Norvasc and start Lisinopril 20mg  daily given his proteinuria with elevate urine Mircoalbumin/Cr ratio Educational resources provided:   Self management tools provided:   Other plans: Follow up in one week for BP recheck with Lisinopril. Will also need BMET.

## 2013-12-10 ENCOUNTER — Ambulatory Visit: Payer: Medicare HMO | Admitting: Internal Medicine

## 2013-12-16 ENCOUNTER — Ambulatory Visit (INDEPENDENT_AMBULATORY_CARE_PROVIDER_SITE_OTHER): Payer: Commercial Managed Care - HMO | Admitting: Pharmacist

## 2013-12-16 DIAGNOSIS — Z86711 Personal history of pulmonary embolism: Secondary | ICD-10-CM

## 2013-12-16 DIAGNOSIS — Z7901 Long term (current) use of anticoagulants: Secondary | ICD-10-CM

## 2013-12-16 NOTE — Progress Notes (Signed)
Anti-Coagulation Progress Note  Kevin Castillo is a 67 y.o. male who is currently on an anti-coagulation regimen.    RECENT RESULTS: Recent results are below, the most recent result is correlated with a dose of 60 mg. per week: Lab Results  Component Value Date   INR 3.0 12/16/2013   INR 3.0 12/16/2013   INR 2.30 11/18/2013    ANTI-COAG DOSE: Anticoagulation Dose Instructions as of 12/16/2013     Dorene Grebe Tue Wed Thu Fri Sat   New Dose 10 mg 5 mg 10 mg 10 mg 5 mg 10 mg 10 mg       ANTICOAG SUMMARY: Anticoagulation Episode Summary   Current INR goal 2.0-3.0  Next INR check 01/13/2014  INR from last check 3.0 (12/16/2013)  Weekly max dose   Target end date   [facility]  Preferred lab   Send INR reminders to    Indications  History of pulmonary embolus (PE) [V12.55] Long term (current) use of anticoagulants [V58.61]        Comments         ANTICOAG TODAY: Anticoagulation Summary as of 12/16/2013   INR goal 2.0-3.0  Selected INR 3.0 (12/16/2013)  Next INR check 01/13/2014  Target end date    Indications  History of pulmonary embolus (PE) [V12.55] Long term (current) use of anticoagulants [V58.61]      Anticoagulation Episode Summary   [facility]   Preferred lab    Send INR reminders to    Comments       PATIENT INSTRUCTIONS: Patient Instructions  Patient instructed to take medications as defined in the Anti-coagulation Track section of this encounter.  Patient instructed to take today's dose.  Patient verbalized understanding of these instructions.       FOLLOW-UP Return in 4 weeks (on 01/13/2014) for Follow up INR at 1100h.  Jorene Guest, III Pharm.D., CACP

## 2013-12-16 NOTE — Patient Instructions (Signed)
Patient instructed to take medications as defined in the Anti-coagulation Track section of this encounter.  Patient instructed to take today's dose.  Patient verbalized understanding of these instructions.    

## 2013-12-30 ENCOUNTER — Encounter: Payer: Self-pay | Admitting: Internal Medicine

## 2013-12-30 ENCOUNTER — Ambulatory Visit (INDEPENDENT_AMBULATORY_CARE_PROVIDER_SITE_OTHER): Payer: Medicare HMO | Admitting: Internal Medicine

## 2013-12-30 VITALS — BP 185/83 | HR 88 | Temp 98.6°F | Ht 68.0 in | Wt 229.5 lb

## 2013-12-30 DIAGNOSIS — I739 Peripheral vascular disease, unspecified: Secondary | ICD-10-CM

## 2013-12-30 DIAGNOSIS — R5383 Other fatigue: Secondary | ICD-10-CM

## 2013-12-30 DIAGNOSIS — I10 Essential (primary) hypertension: Secondary | ICD-10-CM

## 2013-12-30 DIAGNOSIS — R5381 Other malaise: Secondary | ICD-10-CM

## 2013-12-30 HISTORY — DX: Peripheral vascular disease, unspecified: I73.9

## 2013-12-30 LAB — CBC
HCT: 30.8 % — ABNORMAL LOW (ref 39.0–52.0)
Hemoglobin: 10.1 g/dL — ABNORMAL LOW (ref 13.0–17.0)
MCH: 27.2 pg (ref 26.0–34.0)
MCHC: 32.8 g/dL (ref 30.0–36.0)
MCV: 83 fL (ref 78.0–100.0)
PLATELETS: 232 10*3/uL (ref 150–400)
RBC: 3.71 MIL/uL — AB (ref 4.22–5.81)
RDW: 18.4 % — ABNORMAL HIGH (ref 11.5–15.5)
WBC: 5.7 10*3/uL (ref 4.0–10.5)

## 2013-12-30 MED ORDER — AMLODIPINE BESYLATE 10 MG PO TABS: 10.0000 mg | ORAL_TABLET | Freq: Every day | ORAL | Status: DC

## 2013-12-30 NOTE — Patient Instructions (Signed)
It was nice to meet you today.   Your blood pressure is still not under control. So we will add back your old medicine and continue taking all your other pills.  -Toprol -Lisinopril -Norvasc  We will recheck in 2-4 weeks to see how you are doing.   For your leg pain we will set up a ultrasound of your legs.   In terms of your energy we will check some lab work.   Have a happy new year!

## 2013-12-30 NOTE — Assessment & Plan Note (Signed)
BP Readings from Last 3 Encounters:  12/30/13 185/83  12/02/13 131/69  11/11/13 157/87   Pt continues to have elevated BP. Was recenlty d/c Norvasc and started on lisinopril -add back norvasc  -continue lisinopril can increase if still elevated subsequent visit.  -recheck in 2-4 weeks -bmet completed in clinic since staring lisinopril

## 2013-12-30 NOTE — Assessment & Plan Note (Addendum)
Pt has known CAD and continues to smoke. Describes classic claudication that is relieved with rest. No threatened limb findings or ulcers on PE.  -ABI  -smoking cessation discussed and resources made available

## 2013-12-30 NOTE — Assessment & Plan Note (Addendum)
This can be multifactorial. Patient is a chronic alcoholic which may be causing some malnutrition, patient had a history of anemia, other symptoms include impotence and decreased sex drive which may be indicative of testosterone deficiency. Patient has not had any significant weight loss since this time. No signs or symptoms to indicate infection or other process currently.  Wt Readings from Last 3 Encounters:  12/30/13 229 lb 8 oz (104.101 kg)  12/02/13 225 lb (102.059 kg)  11/11/13 226 lb 1.6 oz (102.558 kg)   -cbc, bmet, testosterone labs drawn -may consider CXR given long standing smoking hx for lung ca screening -f/u with PCP

## 2013-12-30 NOTE — Progress Notes (Signed)
   Subjective:    Patient ID: Kevin Castillo, male    DOB: 1946/04/14, 68 y.o.   MRN: [id number]  HPI Kevin Castillo is a 68 yo man pmh as listed below presents for HTN recheck.   Pt recently started lisinopril and Norvasc discontinued on 12/04/13 and pt has been compliant with this medication. No dizziness, cp, sob, or chest pain since staring new medication.   Pt having some pain with ambulation that is worse with walking and then is relieved with rest or sitting down. He continues to smoke 0.5ppd.   Pt also complaining of some increasing fatigue decreased sex drive and impotence. Pt has been taking multi-vitamin and thiamine with little improvement. Pt continues to drink 1 bottle of gin daily.   Review of Systems  Constitutional: Positive for fatigue (not improved with vitamins). Negative for fever, chills, activity change and unexpected weight change.  Respiratory: Negative for cough, chest tightness and shortness of breath.   Cardiovascular: Negative for chest pain, palpitations and leg swelling.       Claudication that has been slowly increasing   Gastrointestinal: Negative for nausea, vomiting, abdominal pain, diarrhea and constipation.  Endocrine: Negative for cold intolerance, heat intolerance, polydipsia and polyuria.  Genitourinary: Negative for difficulty urinating.       Impotence and decreased sex drive   Musculoskeletal: Negative for arthralgias and myalgias.  Neurological: Negative for dizziness, syncope, weakness and headaches.    Past Medical History  Diagnosis Date  . Coronary artery disease   . Diabetes mellitus without complication   . HTN (hypertension)   . Gout   . Obesity   . Alcohol abuse   . Tobacco abuse   . History of pulmonary embolism   . Hypercholesteremia    Social, surgical, family history reviewed with patient and updated in appropriate chart locations.      Objective:   Physical Exam Filed Vitals:   12/30/13 1532  BP: 185/83  Pulse: 88    Temp: 98.6 F (37 C)   General: sitting in chair, NAD HEENT: PERRL, EOMI, no scleral icterus Cardiac: RRR, no rubs, murmurs or gallops Pulm: clear to auscultation bilaterally, no crackles, wheezes or rhonchi, moving normal volumes of air Abd: soft, nontender, nondistended, BS present Ext: warm and well perfused, no pedal edema Neuro: alert and oriented X3, cranial nerves II-XII grossly intact     Assessment & Plan:  Please see problem oriented charting  Pt discussed with Dr. Lynnae January

## 2013-12-31 LAB — TESTOSTERONE: TESTOSTERONE: 379 ng/dL (ref 300–890)

## 2013-12-31 LAB — BASIC METABOLIC PANEL
BUN: 18 mg/dL (ref 6–23)
CALCIUM: 9.2 mg/dL (ref 8.4–10.5)
CO2: 26 meq/L (ref 19–32)
CREATININE: 1.47 mg/dL — AB (ref 0.50–1.35)
Chloride: 105 mEq/L (ref 96–112)
Glucose, Bld: 103 mg/dL — ABNORMAL HIGH (ref 70–99)
Potassium: 3.8 mEq/L (ref 3.5–5.3)
SODIUM: 141 meq/L (ref 135–145)

## 2014-01-03 ENCOUNTER — Ambulatory Visit (HOSPITAL_COMMUNITY)
Admission: RE | Admit: 2014-01-03 | Discharge: 2014-01-03 | Disposition: A | Discharge status: Home / Self Care | Payer: Medicare HMO | Source: Ambulatory Visit | Attending: Internal Medicine | Admitting: Internal Medicine

## 2014-01-03 DIAGNOSIS — I739 Peripheral vascular disease, unspecified: Secondary | ICD-10-CM

## 2014-01-03 DIAGNOSIS — I779 Disorder of arteries and arterioles, unspecified: Secondary | ICD-10-CM | POA: Insufficient documentation

## 2014-01-03 DIAGNOSIS — M79609 Pain in unspecified limb: Secondary | ICD-10-CM | POA: Insufficient documentation

## 2014-01-03 NOTE — Progress Notes (Signed)
Case discussed with Dr. Sadek soon after the resident saw the patient.  We reviewed the resident's history and exam and pertinent patient test results.  I agree with the assessment, diagnosis, and plan of care documented in the resident's note. 

## 2014-01-03 NOTE — Progress Notes (Signed)
VASCULAR LAB PRELIMINARY  ARTERIAL  ABI completed: results of bilateral moderate arterial disease. ABIs could be falsely elevated due to calcified vessels.    RIGHT    LEFT    PRESSURE WAVEFORM  PRESSURE WAVEFORM  BRACHIAL 208 Tri  BRACHIAL 190 Tri   DP   DP    AT 92 Damp mono AT 45 Damp mono  PT 134 Damp mono PT 134 Mono   PER   PER    GREAT TOE  NA GREAT TOE  NA    RIGHT LEFT  ABI 0.64 0.64     Landry Mellow, RDMS, RVT  01/03/2014, 3:44 PM

## 2014-01-13 ENCOUNTER — Ambulatory Visit: Payer: Medicare HMO

## 2014-01-13 ENCOUNTER — Ambulatory Visit (INDEPENDENT_AMBULATORY_CARE_PROVIDER_SITE_OTHER): Payer: Medicare HMO | Admitting: Pharmacist

## 2014-01-13 DIAGNOSIS — Z7901 Long term (current) use of anticoagulants: Secondary | ICD-10-CM

## 2014-01-13 DIAGNOSIS — Z86711 Personal history of pulmonary embolism: Secondary | ICD-10-CM

## 2014-01-13 LAB — POCT INR: INR: 1.7

## 2014-01-14 NOTE — Progress Notes (Signed)
Anti-Coagulation Progress Note  Kevin Castillo is a 68 y.o. male who is currently on an anti-coagulation regimen.    RECENT RESULTS: Recent results are below, the most recent result is correlated with a dose of 60 mg. per week: Lab Results  Component Value Date   INR 1.70 01/13/2014   INR 3.0 12/16/2013   INR 3.0 12/16/2013    ANTI-COAG DOSE: Anticoagulation Dose Instructions as of 01/13/2014     Dorene Grebe Tue Wed Thu Fri Sat   New Dose 10 mg 10 mg 10 mg 10 mg 10 mg 10 mg 10 mg       ANTICOAG SUMMARY: Anticoagulation Episode Summary   Current INR goal 2.0-3.0  Next INR check 01/27/2014  INR from last check 1.70! (01/13/2014)  Weekly max dose   Target end date   [facility]  Preferred lab   Send INR reminders to    Indications  History of pulmonary embolus (PE) [V12.55] Long term (current) use of anticoagulants [V58.61]        Comments         ANTICOAG TODAY: Anticoagulation Summary as of 01/13/2014   INR goal 2.0-3.0  Selected INR 1.70! (01/13/2014)  Next INR check 01/27/2014  Target end date    Indications  History of pulmonary embolus (PE) [V12.55] Long term (current) use of anticoagulants [V58.61]      Anticoagulation Episode Summary   [facility]   Preferred lab    Send INR reminders to    Comments       PATIENT INSTRUCTIONS: Patient Instructions  Patient instructed to take medications as defined in the Anti-coagulation Track section of this encounter.  Patient instructed to take today's dose.  Patient verbalized understanding of these instructions.       FOLLOW-UP Return in 2 weeks (on 01/27/2014) for Follow up INR at 3:30PM.  Jorene Guest, III Pharm.D., CACP

## 2014-01-14 NOTE — Patient Instructions (Signed)
Patient instructed to take medications as defined in the Anti-coagulation Track section of this encounter.  Patient instructed to take today's dose.  Patient verbalized understanding of these instructions.    

## 2014-01-15 NOTE — Progress Notes (Signed)
  Indication: PE, first and unprovoked.  Duration: indefinite by patient preference, will need further discussion with PCP.  INR: 2-3 target.  Agree with Dr. Gladstone Pih assessment and plan.

## 2014-01-27 ENCOUNTER — Ambulatory Visit (INDEPENDENT_AMBULATORY_CARE_PROVIDER_SITE_OTHER): Payer: Commercial Managed Care - HMO | Admitting: Pharmacist

## 2014-01-27 ENCOUNTER — Encounter: Payer: Self-pay | Admitting: Internal Medicine

## 2014-01-27 ENCOUNTER — Ambulatory Visit (INDEPENDENT_AMBULATORY_CARE_PROVIDER_SITE_OTHER): Payer: Commercial Managed Care - HMO | Admitting: Internal Medicine

## 2014-01-27 VITALS — BP 170/86 | HR 67 | Temp 98.0°F | Ht 68.0 in | Wt 219.2 lb

## 2014-01-27 DIAGNOSIS — Z86711 Personal history of pulmonary embolism: Secondary | ICD-10-CM

## 2014-01-27 DIAGNOSIS — E119 Type 2 diabetes mellitus without complications: Secondary | ICD-10-CM

## 2014-01-27 DIAGNOSIS — E7800 Pure hypercholesterolemia, unspecified: Secondary | ICD-10-CM

## 2014-01-27 DIAGNOSIS — F1010 Alcohol abuse, uncomplicated: Secondary | ICD-10-CM

## 2014-01-27 DIAGNOSIS — I10 Essential (primary) hypertension: Secondary | ICD-10-CM

## 2014-01-27 DIAGNOSIS — Z7901 Long term (current) use of anticoagulants: Secondary | ICD-10-CM

## 2014-01-27 DIAGNOSIS — Z0000 Encounter for general adult medical examination without abnormal findings: Secondary | ICD-10-CM

## 2014-01-27 DIAGNOSIS — F17200 Nicotine dependence, unspecified, uncomplicated: Secondary | ICD-10-CM

## 2014-01-27 DIAGNOSIS — F528 Other sexual dysfunction not due to a substance or known physiological condition: Secondary | ICD-10-CM

## 2014-01-27 LAB — POCT INR: INR: 1

## 2014-01-27 NOTE — Assessment & Plan Note (Signed)
Still drinking but less than before.  Continue folic acid, thiamine.

## 2014-01-27 NOTE — Assessment & Plan Note (Signed)
Refused colonoscopy.  States he completed stool cards which were negative.

## 2014-01-27 NOTE — Assessment & Plan Note (Signed)
BP uncontrolled today.  Patient has been without antihypertensives for at least 6 weeks (unclear if he was taking them prior to this time).  He likely needs medication assistance at home as he is uncertain of what he should be taking and has not filled the majority of his medications in months.  Have contacted both of his pharmacies to ensure that they will not fill any of his antihypertensives until medication assistance has been arranged, and we can decide on appropriate regimen (do not to bottom out BP).  He has CAD s/p CABG thus has indication for B blocker.  He also has had significant proteinuria and has DM2 thus has indication for ACEi.   -Ambulatory referral to social work.  Will likely start patient on Toprol-XL 25 mg daily and lisinopril 5 mg daily after establishing contact with [facility] worker.  -Have informed both pharmacies that patient may fill only Coumadin, Crestor, folic acid, and thiamine at this time.  Patient states that he will fill these medicines on Wednesday 2/11.

## 2014-01-27 NOTE — Assessment & Plan Note (Signed)
Continue Crestor 

## 2014-01-27 NOTE — Patient Instructions (Signed)
Follow-up with Dr. Hayes Ludwig in 2-3 weeks.   We will have someone come to your home to help you with your medicines.  We will figure out which blood pressure medicines you should be taking once we talk to that person.   You should still pick up your Coumadin (blood thinner), Crestor (cholesterol medicine), thiamine, and folic acid and take them in the meantime.    Hypertension Hypertension is another name for high blood pressure. High blood pressure may mean that your heart needs to work harder to pump blood. Blood pressure consists of two numbers, which includes a higher number over a lower number (example: 110/72). HOME CARE   Make lifestyle changes as told by your doctor. This may include weight loss and exercise.  Take your blood pressure medicine every day.  Limit how much salt you use.  Stop smoking if you smoke.  Do not use drugs.  Talk to your doctor if you are using decongestants or birth control pills. These medicines might make blood pressure higher.  Females should not drink more than 1 alcoholic drink per day. Males should not drink more than 2 alcoholic drinks per day.  See your doctor as told. GET HELP RIGHT AWAY IF:   You have a blood pressure reading with a top number of 180 or higher.  You get a very bad headache.  You get blurred or changing vision.  You feel confused.  You feel weak, numb, or faint.  You get chest or belly (abdominal) pain.  You throw up (vomit).  You cannot breathe very well. MAKE SURE YOU:   Understand these instructions.  Will watch your condition.  Will get help right away if you are not doing well or get worse. Document Released: 05/23/2008 Document Revised: 02/27/2012 Document Reviewed: 05/23/2008 [facility] Patient Information 2014 [place], [place].

## 2014-01-27 NOTE — Progress Notes (Signed)
Anti-Coagulation Progress Note  Kevin Castillo is a 68 y.o. male who is currently on an anti-coagulation regimen.    RECENT RESULTS: Recent results are below, the most recent result is correlated with a dose of 70 mg. per week: Lab Results  Component Value Date   INR 1.00 01/27/2014   INR 1.70 01/13/2014   INR 3.0 12/16/2013    ANTI-COAG DOSE: Anticoagulation Dose Instructions as of 01/27/2014     Dorene Grebe Tue Wed Thu Fri Sat   New Dose 10 mg 15 mg 10 mg 10 mg 15 mg 10 mg 10 mg       ANTICOAG SUMMARY: Anticoagulation Episode Summary   Current INR goal 2.0-3.0  Next INR check 02/10/2014  INR from last check 1.00! (01/27/2014)  Weekly max dose   Target end date   [facility]  Preferred lab   Send INR reminders to    Indications  History of pulmonary embolus (PE) [V12.55] Long term (current) use of anticoagulants [V58.61]        Comments         ANTICOAG TODAY: Anticoagulation Summary as of 01/27/2014   INR goal 2.0-3.0  Selected INR 1.00! (01/27/2014)  Next INR check 02/10/2014  Target end date    Indications  History of pulmonary embolus (PE) [V12.55] Long term (current) use of anticoagulants [V58.61]      Anticoagulation Episode Summary   [facility]   Preferred lab    Send INR reminders to    Comments       PATIENT INSTRUCTIONS: Patient Instructions  Patient instructed to take medications as defined in the Anti-coagulation Track section of this encounter.  Patient instructed to take today's dose.  Patient verbalized understanding of these instructions.       FOLLOW-UP Return in 2 weeks (on 02/10/2014) for Follow up INR at 2:45PM.  Jorene Guest, III Pharm.D., CACP

## 2014-01-27 NOTE — Assessment & Plan Note (Signed)
Patient continues to see Dr. Elie Confer for INR checks and Coumadin therapy.  He is likely noncompliant with Coumadin given INR of 1.0 today.

## 2014-01-27 NOTE — Assessment & Plan Note (Signed)
Patient requesting "low dose" Cialis today.  Informed him that we need to get his blood pressure under control before adding this medication to his regimen.

## 2014-01-27 NOTE — Progress Notes (Signed)
Patient ID: Kevin Castillo, male   DOB: 29-Jul-1946, 68 y.o.   MRN: [id number]   Subjective:   Patient ID: Kevin Castillo male   DOB: 1946-11-17 68 y.o.   MRN: [id number]  HPI: [name] is a 68 y.o. man with history of controlled DM2, HTN, HL, PE on chronic Coumadin, CKD3, alcohol abuse, ED who presents for BP check.   BP today 192/99, repeat 170/86.  Patient states he has been out of all of his medications for the past 3 days.  He has reportedly been filling his medicines at [facility] in [facility] but was unable to do so last week due to financial constraints.  He does not check his BP at home.  Denies headache, chest pain, blurry vision.   Patient states he has cut back on both his drinking and smoking.  States one pint of gin and two packs of cigarettes lasted him the whole week last week.  He notes that purchasing alcohol and cigarettes contribute to his financial constraints.  Called both Public affairs consultant (two pharmacies listed in patient's pharmacy list).  Per [facility] pharmacist, patient has never filled any antihypertensive prescriptions (including amlodipine, lisinopril, and metoprolol) there; he did fill metformin and doxazosin in 11/2013.  Per [facility] pharmacist, patient filled amlodipine, metoprolol, doxazosin, and warfarin on 11/04/13.    Past Medical History  Diagnosis Date  . Coronary artery disease   . Diabetes mellitus without complication   . HTN (hypertension)   . Gout   . Obesity   . Alcohol abuse   . Tobacco abuse   . History of pulmonary embolism   . Hypercholesteremia    Current Outpatient Prescriptions  Medication Sig Dispense Refill  . folic acid (FOLVITE) 1 MG tablet Take 1 tablet (1 mg total) by mouth daily.  30 tablet  2  . rosuvastatin (CRESTOR) 10 MG tablet Take 1 tablet (10 mg total) by mouth daily.  30 tablet  2  . thiamine (VITAMIN B-1) 100 MG tablet Take 1 tablet (100 mg total) by mouth daily.  30 tablet  2  . warfarin (COUMADIN) 10  MG tablet Take 1 tablet (10 mg total) by mouth daily.  32 tablet  1   No current facility-administered medications for this visit.   Family History  Problem Relation Age of Onset  . Heart disease Father   . COPD Father   . Diabetes Mellitus II Mother    History   Social History  . Marital Status: Married    Spouse Name: N/A    Number of Children: N/A  . Years of Education: 10   Occupational History  . Retired     used to drive a truck locally   [place]  . Smoking status: Current Every Day Smoker -- 0.50 packs/day for 50 years    Types: Cigarettes  . Smokeless tobacco: None     Comment: trying to quit/ PATIENT STATES HE HAS CUT BACK  . Alcohol Use: 0.0 oz/week     Comment: Drinks 1 pint of gin over the weekend but does not drink everyday. Denies hx of blackouts, seizures, tremors.  . Drug Use: No  . Sexual Activity: None   Other Topics Concern  . None   Social History Narrative   Married, lives with his wife, son and grandchild. He has 7 sons and 2 daughters. Only one son and one daughter live here in [place].    Review of Systems: Review of Systems  Constitutional: Negative for  fever and weight loss.  Eyes: Negative for blurred vision.  Respiratory: Negative for cough and shortness of breath.   Cardiovascular: Negative for chest pain, palpitations and leg swelling.  Gastrointestinal: Negative for nausea, vomiting, abdominal pain, diarrhea, constipation and blood in stool.  Genitourinary: Negative for dysuria.  Musculoskeletal: Negative for falls.  Neurological: Negative for dizziness, loss of consciousness, weakness and headaches.    Objective:  Physical Exam: Filed Vitals:   01/27/14 1512 01/27/14 1605  BP: 192/99 170/86  Pulse: 86 67  Temp: 98 F (36.7 C)   TempSrc: Oral   Height: 5\' 8"  (1.727 m)   Weight: 219 lb 3.2 oz (99.428 kg)   SpO2: 99%    General: alert, cooperative, and in no apparent distress HEENT: NCAT, vision grossly  intact, oropharynx clear and non-erythematous  Neck: supple, no lymphadenopathy Lungs: clear to ascultation bilaterally, normal work of respiration, no wheezes, rales, ronchi Heart: regular rate and rhythm, no murmurs, gallops, or rubs Abdomen: soft, non-tender, non-distended, normal bowel sounds Extremities: 2+ DP/PT pulses bilaterally, no cyanosis, clubbing, or edema Neurologic: alert & oriented X3, cranial nerves II-XII intact, strength grossly intact, sensation intact to light touch  Assessment & Plan:  Patient discussed with Dr. Lynnae January.  Please see problem-based assessment and plan.

## 2014-01-27 NOTE — Patient Instructions (Signed)
Patient instructed to take medications as defined in the Anti-coagulation Track section of this encounter.  Patient instructed to take today's dose.  Patient verbalized understanding of these instructions.    

## 2014-01-27 NOTE — Assessment & Plan Note (Signed)
Provided smoking cessation counseling.  Patient is contemplative.

## 2014-01-28 ENCOUNTER — Telehealth: Payer: Self-pay | Admitting: Licensed Clinical Social Worker

## 2014-01-28 ENCOUNTER — Other Ambulatory Visit: Payer: Self-pay | Admitting: Internal Medicine

## 2014-01-28 DIAGNOSIS — N183 Chronic kidney disease, stage 3 unspecified: Secondary | ICD-10-CM

## 2014-01-28 DIAGNOSIS — E119 Type 2 diabetes mellitus without complications: Secondary | ICD-10-CM

## 2014-01-28 LAB — GLUCOSE, CAPILLARY: Glucose-Capillary: 147 mg/dL — ABNORMAL HIGH (ref 70–99)

## 2014-01-28 NOTE — Telephone Encounter (Signed)
Kevin Castillo was referred to CSW for referral to [facility].  CSW placed call to Kevin Castillo to discuss referral.  Pt in agreement with referral to [facility] and confirm phone number.  Pt aware he will receive a call within the next two weeks from [employer]  Pt has had questions regarding his medications, what he should be taking and what was discontinued.  CSW sent message to Triage and Dr. Stann Mainland for clarification.

## 2014-01-28 NOTE — Progress Notes (Signed)
Case discussed with Dr. Rogers at the time of the visit.  We reviewed the resident's history and exam and pertinent patient test results.  I agree with the assessment, diagnosis, and plan of care documented in the resident's note. 

## 2014-02-10 ENCOUNTER — Ambulatory Visit (INDEPENDENT_AMBULATORY_CARE_PROVIDER_SITE_OTHER): Payer: Medicare HMO | Admitting: Pharmacist

## 2014-02-10 ENCOUNTER — Ambulatory Visit (INDEPENDENT_AMBULATORY_CARE_PROVIDER_SITE_OTHER): Payer: Medicare HMO | Admitting: Internal Medicine

## 2014-02-10 ENCOUNTER — Encounter: Payer: Self-pay | Admitting: Internal Medicine

## 2014-02-10 VITALS — BP 203/90 | HR 86 | Temp 97.0°F | Ht 68.0 in | Wt 221.1 lb

## 2014-02-10 DIAGNOSIS — F17200 Nicotine dependence, unspecified, uncomplicated: Secondary | ICD-10-CM

## 2014-02-10 DIAGNOSIS — Z86711 Personal history of pulmonary embolism: Secondary | ICD-10-CM

## 2014-02-10 DIAGNOSIS — E119 Type 2 diabetes mellitus without complications: Secondary | ICD-10-CM

## 2014-02-10 DIAGNOSIS — Z7901 Long term (current) use of anticoagulants: Secondary | ICD-10-CM

## 2014-02-10 DIAGNOSIS — F1010 Alcohol abuse, uncomplicated: Secondary | ICD-10-CM

## 2014-02-10 DIAGNOSIS — I10 Essential (primary) hypertension: Secondary | ICD-10-CM

## 2014-02-10 LAB — GLUCOSE, CAPILLARY: Glucose-Capillary: 94 mg/dL (ref 70–99)

## 2014-02-10 LAB — POCT INR: INR: 1.1

## 2014-02-10 MED ORDER — METOPROLOL SUCCINATE ER 25 MG PO TB24: 25.0000 mg | ORAL_TABLET | Freq: Every day | ORAL | Status: DC

## 2014-02-10 MED ORDER — NICOTINE 7 MG/24HR TD PT24: 7.0000 mg | MEDICATED_PATCH | TRANSDERMAL | Status: DC

## 2014-02-10 MED ORDER — LISINOPRIL 10 MG PO TABS: 10.0000 mg | ORAL_TABLET | Freq: Every day | ORAL | Status: DC

## 2014-02-10 NOTE — Assessment & Plan Note (Signed)
BP 203/90, repeat 191/94 today.  Gave patient rx for Toprol-XL 25 mg daily, lisinopril 10 mg daily today.  He understands that is to take one tab of each daily.  Instructed him to bring all of his medication bottles to next visit.  Sent message to social work regarding [facility] referral.  Ordered cognitive evaluation by SLP.  Follow-up with PCP in 2 weeks.

## 2014-02-10 NOTE — Patient Instructions (Addendum)
Follow-up with Dr. Hayes Ludwig in 2 weeks.   We are starting you on two blood pressure medicines today called METOPROLOL and LISINOPRIL.  You should take one pill of each daily.  Please remember to bring all of your pill bottles in to your next appointment.    We will get in touch with the people who are supposed to be coming to your home and helping with your medicines and blood pressure checks.  We are also referring you to test your cognition and understanding of everything we are asking you to do.   Keep up the good work on decreasing your drinking and smoking.  We prescribed you nicotine patches today that you can use to help with smoking cessation.   You do not need to be checking your blood sugars at home for now.   Hypertension Hypertension is another name for high blood pressure. High blood pressure may mean that your heart needs to work harder to pump blood. Blood pressure consists of two numbers, which includes a higher number over a lower number (example: 110/72). HOME CARE   Make lifestyle changes as told by your doctor. This may include weight loss and exercise.  Take your blood pressure medicine every day.  Limit how much salt you use.  Stop smoking if you smoke.  Do not use drugs.  Talk to your doctor if you are using decongestants or birth control pills. These medicines might make blood pressure higher.  Females should not drink more than 1 alcoholic drink per day. Males should not drink more than 2 alcoholic drinks per day.  See your doctor as told. GET HELP RIGHT AWAY IF:   You have a blood pressure reading with a top number of 180 or higher.  You get a very bad headache.  You get blurred or changing vision.  You feel confused.  You feel weak, numb, or faint.  You get chest or belly (abdominal) pain.  You throw up (vomit).  You cannot breathe very well. MAKE SURE YOU:   Understand these instructions.  Will watch your condition.  Will get help right  away if you are not doing well or get worse. Document Released: 05/23/2008 Document Revised: 02/27/2012 Document Reviewed: 05/23/2008 [facility] Patient Information 2014 [place], [place].

## 2014-02-10 NOTE — Assessment & Plan Note (Signed)
A1C today.  Patient current on no diabetes medicines.

## 2014-02-10 NOTE — Patient Instructions (Signed)
Patient instructed to take medications as defined in the Anti-coagulation Track section of this encounter.  Patient instructed to take today's dose.  Patient verbalized understanding of these instructions.    

## 2014-02-10 NOTE — Progress Notes (Signed)
Anti-Coagulation Progress Note  Kevin Castillo is a 68 y.o. male who is currently on an anti-coagulation regimen.    RECENT RESULTS: Recent results are below, the most recent result is correlated with a dose of 80 mg. per week: Lab Results  Component Value Date   INR 1.10 02/10/2014   INR 1.00 01/27/2014   INR 1.70 01/13/2014    ANTI-COAG DOSE: Anticoagulation Dose Instructions as of 02/10/2014     Dorene Grebe Tue Wed Thu Fri Sat   New Dose 10 mg 15 mg 15 mg 15 mg 15 mg 15 mg 10 mg       ANTICOAG SUMMARY: Anticoagulation Episode Summary   Current INR goal 2.0-3.0  Next INR check 02/17/2014  INR from last check 1.10! (02/10/2014)  Weekly max dose   Target end date   [facility]  Preferred lab   Send INR reminders to    Indications  History of pulmonary embolus (PE) [V12.55] Long term (current) use of anticoagulants [V58.61]        Comments         ANTICOAG TODAY: Anticoagulation Summary as of 02/10/2014   INR goal 2.0-3.0  Selected INR 1.10! (02/10/2014)  Next INR check 02/17/2014  Target end date    Indications  History of pulmonary embolus (PE) [V12.55] Long term (current) use of anticoagulants [V58.61]      Anticoagulation Episode Summary   [facility]   Preferred lab    Send INR reminders to    Comments       PATIENT INSTRUCTIONS: Patient Instructions  Patient instructed to take medications as defined in the Anti-coagulation Track section of this encounter.  Patient instructed to take today's dose.  Patient verbalized understanding of these instructions.       FOLLOW-UP Return in 7 days (on 02/17/2014) for Follow up INR at 2:30PM.  Jorene Guest, III Pharm.D., CACP

## 2014-02-10 NOTE — Assessment & Plan Note (Signed)
Gave patient rx for nicotine patches as he is willing to try these and states they have helped in past.

## 2014-02-10 NOTE — Progress Notes (Signed)
Patient ID: Kevin Castillo, male   DOB: Feb 19, 1946, 68 y.o.   MRN: [id number]   Subjective:   Patient ID: Kevin Castillo male   DOB: [birth year] 68 y.o.   MRN: [id number]  HPI: Kevin Castillo is a 68 y.o. man with history of DM2 (A1C 6.2% in 10/2013), HTN, HL, CAD s/p CABG, PE on chronic Coumadin, CKD3, alcohol and tobacco abuse, ED who presents for BP check.   Patient states he has not been contacted by [facility].  He picked up prescriptions for Coumadin, Crestor, folic acid, and thiamine and has been taking all of these daily.  States he has follow-up appointment with Dr. Elie Confer scheduled.   BP 203/90, repeat 191/94.  He is quite concerned about not being on blood pressure medicines especially given how high his blood pressure is today. (We discontinued all antihypertensives at last visit due to medication noncompliance for >6 weeks and significant concern about patient's understanding of what he should be taking.)  No headache, chest pain, nausea/vomiting, dizziness.    Patient states he is continuing to work on cutting back on his drinking and smoking.  He is currently drinking about one fifth of liquor per week and smoking about 2 packs of cigarettes per week.    Past Medical History  Diagnosis Date  . Coronary artery disease   . Diabetes mellitus without complication   . HTN (hypertension)   . Gout   . Obesity   . Alcohol abuse   . Tobacco abuse   . History of pulmonary embolism   . Hypercholesteremia    Current Outpatient Prescriptions  Medication Sig Dispense Refill  . folic acid (FOLVITE) 1 MG tablet Take 1 tablet (1 mg total) by mouth daily.  30 tablet  2  . rosuvastatin (CRESTOR) 10 MG tablet Take 1 tablet (10 mg total) by mouth daily.  30 tablet  2  . thiamine (VITAMIN B-1) 100 MG tablet Take 1 tablet (100 mg total) by mouth daily.  30 tablet  2  . warfarin (COUMADIN) 10 MG tablet Take 1 tablet (10 mg total) by mouth daily.  32 tablet  1  . lisinopril (PRINIVIL,ZESTRIL) 10 MG  tablet Take 1 tablet (10 mg total) by mouth daily.  30 tablet  11  . metoprolol succinate (TOPROL XL) 25 MG 24 hr tablet Take 1 tablet (25 mg total) by mouth daily.  30 tablet  11  . nicotine (NICODERM CQ - DOSED IN MG/24 HR) 7 mg/24hr patch Place 1 patch (7 mg total) onto the skin daily.  30 patch  0   No current facility-administered medications for this visit.   Family History  Problem Relation Age of Onset  . Heart disease Father   . COPD Father   . Diabetes Mellitus II Mother    History   Social History  . Marital Status: Married    Spouse Name: N/A    Number of Children: N/A  . Years of Education: 10   Occupational History  . Retired     used to drive a truck locally   [employer]  . Smoking status: Current Some Day Smoker -- 0.50 packs/day for 50 years    Types: Cigarettes  . Smokeless tobacco: None     Comment: trying to quit/ PATIENT STATES HE HAS CUT BACK  . Alcohol Use: 0.0 oz/week     Comment: Drinks 1 pint of gin over the weekend but does not drink everyday. Denies hx of blackouts, seizures, tremors.  Marland Kitchen  Drug Use: No  . Sexual Activity: None   Other Topics Concern  . None   Social History Narrative   Married, lives with his wife, son and grandchild. He has 7 sons and 2 daughters. Only one son and one daughter live here in [place].    Review of Systems: Review of Systems  Constitutional: Negative for fever.  Eyes: Negative for blurred vision.  Respiratory: Negative for cough and shortness of breath.   Cardiovascular: Negative for chest pain and leg swelling.  Gastrointestinal: Negative for nausea, vomiting, abdominal pain, diarrhea and constipation.  Genitourinary: Negative for dysuria.  Musculoskeletal: Negative for falls.  Neurological: Negative for dizziness, loss of consciousness, weakness and headaches.    Objective:  Physical Exam: Filed Vitals:   02/10/14 1444  BP: 203/90  Pulse: 86  Temp: 97 F (36.1 C)  TempSrc: Oral  Height:  5\' 8"  (1.727 m)  Weight: 221 lb 1.6 oz (100.29 kg)  SpO2: 97%    General: alert, cooperative, and in no apparent distress HEENT: NCAT, vision grossly intact, oropharynx clear and non-erythematous  Neck: supple, no lymphadenopathy Lungs: clear to ascultation bilaterally, normal work of respiration, no wheezes, rales, ronchi Heart: regular rate and rhythm, no murmurs, gallops, or rubs Abdomen: soft, non-tender, non-distended, normal bowel sounds Extremities: 2+ DP/PT pulses bilaterally, no cyanosis, clubbing, or edema Neurologic: alert & oriented X3, cranial nerves II-XII intact, strength grossly intact, sensation intact to light touch  Assessment & Plan:  Patient discussed with Dr. Murlean Caller.  Please see problem-based assessment and plan.

## 2014-02-10 NOTE — Assessment & Plan Note (Signed)
Patient continues to work on decreasing his drinking.  Currently one fifth per week.

## 2014-02-11 ENCOUNTER — Other Ambulatory Visit: Payer: Self-pay | Admitting: Internal Medicine

## 2014-02-11 DIAGNOSIS — I2699 Other pulmonary embolism without acute cor pulmonale: Secondary | ICD-10-CM

## 2014-02-11 NOTE — Progress Notes (Signed)
INTERNAL MEDICINE TEACHING ATTENDING ADDENDUM - Dorathea Faerber, DO: I reviewed and discussed at the time of visit with the resident Dr. Rogers, the patient's medical history, physical examination, diagnosis and results of tests and treatment and I agree with the patient's care as documented.  

## 2014-02-18 ENCOUNTER — Encounter: Payer: Self-pay | Admitting: Licensed Clinical Social Worker

## 2014-02-18 DIAGNOSIS — E119 Type 2 diabetes mellitus without complications: Secondary | ICD-10-CM

## 2014-02-24 ENCOUNTER — Other Ambulatory Visit: Payer: Self-pay | Admitting: Internal Medicine

## 2014-02-24 ENCOUNTER — Ambulatory Visit: Payer: Commercial Managed Care - HMO | Admitting: Internal Medicine

## 2014-02-24 MED ORDER — ATORVASTATIN CALCIUM 20 MG PO TABS: 20.0000 mg | ORAL_TABLET | Freq: Every day | ORAL | Status: DC

## 2014-03-05 ENCOUNTER — Encounter: Payer: Self-pay | Admitting: Internal Medicine

## 2014-03-05 ENCOUNTER — Ambulatory Visit (INDEPENDENT_AMBULATORY_CARE_PROVIDER_SITE_OTHER): Payer: Commercial Managed Care - HMO | Admitting: Internal Medicine

## 2014-03-05 VITALS — BP 173/89 | HR 90 | Temp 98.2°F | Wt 217.9 lb

## 2014-03-05 DIAGNOSIS — N183 Chronic kidney disease, stage 3 unspecified: Secondary | ICD-10-CM

## 2014-03-05 DIAGNOSIS — N1832 Chronic kidney disease, stage 3b: Secondary | ICD-10-CM

## 2014-03-05 DIAGNOSIS — I129 Hypertensive chronic kidney disease with stage 1 through stage 4 chronic kidney disease, or unspecified chronic kidney disease: Secondary | ICD-10-CM

## 2014-03-05 DIAGNOSIS — Z86711 Personal history of pulmonary embolism: Secondary | ICD-10-CM

## 2014-03-05 DIAGNOSIS — I10 Essential (primary) hypertension: Secondary | ICD-10-CM

## 2014-03-05 DIAGNOSIS — Z86718 Personal history of other venous thrombosis and embolism: Secondary | ICD-10-CM

## 2014-03-05 DIAGNOSIS — E119 Type 2 diabetes mellitus without complications: Secondary | ICD-10-CM

## 2014-03-05 LAB — BASIC METABOLIC PANEL WITH GFR
BUN: 17 mg/dL (ref 6–23)
CO2: 31 mEq/L (ref 19–32)
CREATININE: 1.43 mg/dL — AB (ref 0.50–1.35)
Calcium: 8.9 mg/dL (ref 8.4–10.5)
Chloride: 103 mEq/L (ref 96–112)
GFR, EST NON AFRICAN AMERICAN: 50 mL/min — AB
GFR, Est African American: 58 mL/min — ABNORMAL LOW
Glucose, Bld: 145 mg/dL — ABNORMAL HIGH (ref 70–99)
Potassium: 4.1 mEq/L (ref 3.5–5.3)
SODIUM: 140 meq/L (ref 135–145)

## 2014-03-05 LAB — GLUCOSE, CAPILLARY: Glucose-Capillary: 137 mg/dL — ABNORMAL HIGH (ref 70–99)

## 2014-03-05 LAB — POCT INR: INR: 1.3

## 2014-03-05 LAB — POCT GLYCOSYLATED HEMOGLOBIN (HGB A1C): Hemoglobin A1C: 6.1

## 2014-03-05 MED ORDER — LISINOPRIL 40 MG PO TABS: 40.0000 mg | ORAL_TABLET | Freq: Every day | ORAL | Status: DC

## 2014-03-05 MED ORDER — METOPROLOL SUCCINATE ER 50 MG PO TB24: 50.0000 mg | ORAL_TABLET | Freq: Every day | ORAL | Status: DC

## 2014-03-05 NOTE — Patient Instructions (Addendum)
We are increasing the dose of your blood pressure medicines.   Until you run out of medicine take 2 of the pills in lisinopril bottle and take 2 of the pills in the metoprolol bottle. We have put a 2 on the lid of those bottles so you can remember which ones.   We will change the dose of your coumadin.   Take 1 pill on Sunday  Take 1.5 pills (one and a half) on Monday  Take 1.5 pills (one and a half) on Tuesday  Take 1.5 pills (one and a half) on Wednesday  Take 1.5 pills (one and a half) on Thursday  Take 1 pill on Friday  Take 1 pill on Saturday   You need to come see Dr. Elie Confer in 1-2 weeks to check on your blood thinner.   Come back to see the doctor in 4-6 weeks and we will need to check some blood work then.  Please bring your medicines with you each time you come.   Medicines may be  Eye drops  Herbal   Vitamins  Pills  Seeing these help [place] take care of you.  Thank you for bringing your medicines today. This helps [place] keep you safe from mistakes!

## 2014-03-05 NOTE — Assessment & Plan Note (Signed)
Check BMP today 

## 2014-03-05 NOTE — Assessment & Plan Note (Signed)
Checking INR today which was... He is taking his coumadin although he is not clearly able to tell me the dosing. Would recommend his PCP have a discussion about risk versus benefit given he had one unprovoked DVT/PE 2 years ago. His last several INR readings have not been in range of anticoagulation and he may not be getting the benefit from taking coumadin. He is elects to continue with anticoagulation would benefit from xarelto (his co-pays are expensive and he does not like to pay for visits). He was advised to continue taking coumadin today at the following dosage: His PCP and Dr. Elie Confer (director of coumadin clinic and pharmacist) were notified of these changes.

## 2014-03-05 NOTE — Assessment & Plan Note (Signed)
Given his CKD good control of his BP is important. Increased his metoprolol from 25 mg daily to 50 mg daily (ER tab). Increased his lisinopril to 40 mg daily. He will return for visit in 4-6 weeks for BMP and BP check. BMP drawn today after starting back ACE-I.

## 2014-03-05 NOTE — Progress Notes (Signed)
Subjective:     Patient ID: Kevin Castillo, male   DOB: Apr 24, 1946, 68 y.o.   MRN: [id number]  HPI The patient is a 68 YO man with history of PE, HTN, CKD, DM type 2, anemia who is coming in for a blood pressure recheck. He was supposed to see the coumadin clinic weeks ago and never followed up but states he has been taking all of his medications. He denies headache, chest pain, SOB, leg swelling or pain, nausea, vomiting, abdominal pain. He is not having any complaints today. He is able to tell me that he takes 1 pill of coumadin everyday except Monday and Tuesday he takes 1.5 pills.   Review of Systems  Constitutional: Negative for fever, chills, diaphoresis, activity change, appetite change, fatigue and unexpected weight change.  Respiratory: Negative for cough, chest tightness, shortness of breath and wheezing.   Cardiovascular: Negative for chest pain, palpitations and leg swelling.  Gastrointestinal: Negative for nausea, vomiting, abdominal pain and diarrhea.  Musculoskeletal: Negative for back pain.  Skin: Negative for wound.  Neurological: Negative for dizziness, tremors, seizures, syncope, facial asymmetry, speech difficulty, weakness, light-headedness, numbness and headaches.  Hematological: Negative for adenopathy. Does not bruise/bleed easily.       Objective:   Physical Exam  Constitutional: He is oriented to person, place, and time. He appears well-developed and well-nourished.  Overweight  HENT:  Head: Normocephalic and atraumatic.  Neck: Normal range of motion. Neck supple.  Cardiovascular: Normal rate and regular rhythm.   Pulmonary/Chest: Effort normal and breath sounds normal. No respiratory distress. He has no wheezes. He has no rales. He exhibits no tenderness.  Abdominal: Soft. Bowel sounds are normal. He exhibits no distension and no mass. There is no tenderness. There is no rebound and no guarding.  Musculoskeletal: Normal range of motion. He exhibits no edema and  no tenderness.  Neurological: He is alert and oriented to person, place, and time. No cranial nerve deficit.  Skin: Skin is warm and dry.       Assessment/Plan:   1. Please see problem oriented charting.  2. Disposition - Patient will be seen back in 4-6 weeks for BP check. He will need to see Dr. Elie Confer back for INR check in 1-2 weeks.

## 2014-03-05 NOTE — Assessment & Plan Note (Addendum)
Checked his HgA1c today as he was due which was 6.1 off all medicines. Will continue to observe q3-6 months with HgA1c. He was stopped from metformin for rising creatinine level. He is on ACE-I.

## 2014-03-06 NOTE — Progress Notes (Signed)
Case discussed with Dr. Kollar soon after the resident saw the patient.  We reviewed the resident's history and exam and pertinent patient test results.  I agree with the assessment, diagnosis, and plan of care documented in the resident's note. 

## 2014-03-10 ENCOUNTER — Ambulatory Visit: Payer: Medicare HMO

## 2014-03-24 ENCOUNTER — Ambulatory Visit (INDEPENDENT_AMBULATORY_CARE_PROVIDER_SITE_OTHER): Payer: Medicare HMO | Admitting: Pharmacist

## 2014-03-24 DIAGNOSIS — Z86711 Personal history of pulmonary embolism: Secondary | ICD-10-CM

## 2014-03-24 DIAGNOSIS — Z7901 Long term (current) use of anticoagulants: Secondary | ICD-10-CM

## 2014-03-24 LAB — POCT INR: INR: 5.1

## 2014-03-24 NOTE — Progress Notes (Signed)
Anti-Coagulation Progress Note  Kevin Castillo is a 68 y.o. male who is currently on an anti-coagulation regimen.    RECENT RESULTS: Recent results are below, the most recent result is correlated with a dose of 95 mg. per week: Lab Results  Component Value Date   INR 5.10 03/24/2014   INR 1.3 03/05/2014   INR 1.10 02/10/2014    ANTI-COAG DOSE: Anticoagulation Dose Instructions as of 03/24/2014     Dorene Grebe Tue Wed Thu Fri Sat   New Dose 10 mg 10 mg 0 mg 10 mg 15 mg 10 mg 10 mg       ANTICOAG SUMMARY: Anticoagulation Episode Summary   Current INR goal 2.0-3.0  Next INR check 03/31/2014  INR from last check 5.10! (03/24/2014)  Weekly max dose   Target end date   [facility]  Preferred lab   Send INR reminders to    Indications  History of pulmonary embolus (PE) [V12.55] Long term (current) use of anticoagulants [V58.61]        Comments         ANTICOAG TODAY: Anticoagulation Summary as of 03/24/2014   INR goal 2.0-3.0  Selected INR 5.10! (03/24/2014)  Next INR check 03/31/2014  Target end date    Indications  History of pulmonary embolus (PE) [V12.55] Long term (current) use of anticoagulants [V58.61]      Anticoagulation Episode Summary   [facility]   Preferred lab    Send INR reminders to    Comments       PATIENT INSTRUCTIONS: Patient Instructions  Patient instructed to take medications as defined in the Anti-coagulation Track section of this encounter.  Patient instructed to take today's dose.  Patient verbalized understanding of these instructions.       FOLLOW-UP Return in 7 days (on 03/31/2014) for Follow up INR at [place], III Pharm.D., CACP

## 2014-03-24 NOTE — Progress Notes (Signed)
INTERNAL MEDICINE TEACHING ATTENDING ADDENDUM - Dr. Aldine Contes M.D Duration- indefinite, Indication- PE, INR- supratherapeutic. Agree with Dr. Gladstone Pih recommendations as outlined in his note. Pt to f/u in 7 days for repeat INR

## 2014-03-24 NOTE — Patient Instructions (Signed)
Patient instructed to take medications as defined in the Anti-coagulation Track section of this encounter.  Patient instructed to take today's dose.  Patient verbalized understanding of these instructions.    

## 2014-03-31 ENCOUNTER — Ambulatory Visit: Payer: Medicare HMO

## 2014-04-08 ENCOUNTER — Ambulatory Visit (INDEPENDENT_AMBULATORY_CARE_PROVIDER_SITE_OTHER): Payer: Commercial Managed Care - HMO | Admitting: Internal Medicine

## 2014-04-08 ENCOUNTER — Encounter: Payer: Self-pay | Admitting: Internal Medicine

## 2014-04-08 VITALS — BP 160/80 | HR 79 | Temp 99.0°F | Ht 67.0 in | Wt 211.5 lb

## 2014-04-08 DIAGNOSIS — I10 Essential (primary) hypertension: Secondary | ICD-10-CM

## 2014-04-08 DIAGNOSIS — N183 Chronic kidney disease, stage 3 unspecified: Secondary | ICD-10-CM

## 2014-04-08 DIAGNOSIS — R51 Headache: Secondary | ICD-10-CM

## 2014-04-08 DIAGNOSIS — I129 Hypertensive chronic kidney disease with stage 1 through stage 4 chronic kidney disease, or unspecified chronic kidney disease: Secondary | ICD-10-CM

## 2014-04-08 DIAGNOSIS — Z7901 Long term (current) use of anticoagulants: Secondary | ICD-10-CM

## 2014-04-08 DIAGNOSIS — E119 Type 2 diabetes mellitus without complications: Secondary | ICD-10-CM

## 2014-04-08 DIAGNOSIS — N1832 Chronic kidney disease, stage 3b: Secondary | ICD-10-CM

## 2014-04-08 DIAGNOSIS — Z86711 Personal history of pulmonary embolism: Secondary | ICD-10-CM

## 2014-04-08 DIAGNOSIS — J309 Allergic rhinitis, unspecified: Secondary | ICD-10-CM

## 2014-04-08 LAB — GLUCOSE, CAPILLARY: Glucose-Capillary: 84 mg/dL (ref 70–99)

## 2014-04-08 LAB — POCT INR: INR: 2.1

## 2014-04-08 MED ORDER — FLUTICASONE PROPIONATE 50 MCG/ACT NA SUSP: 2.0000 | Freq: Every day | NASAL | Status: DC

## 2014-04-08 MED ORDER — AMLODIPINE BESYLATE 5 MG PO TABS: 5.0000 mg | ORAL_TABLET | Freq: Every day | ORAL | Status: DC

## 2014-04-08 MED ORDER — ACETAMINOPHEN 500 MG PO CHEW: 500.0000 mg | CHEWABLE_TABLET | Freq: Four times a day (QID) | ORAL | Status: DC | PRN

## 2014-04-08 NOTE — Patient Instructions (Addendum)
-  Start taking amlodipine 5mg  daily for your blood pressure. Continue taking metoprolol and lisinopril for your blood pressure.  -Start using Flonase nasal spray for the sneezing and runny nose.  -You may take Tylenol 500mg  every 6 hours for headache.  -Avoid Motrin, Advil, Ibuprofen, Aleve as there medicines can raise your INR.  -Follow up in 2-3 weeks for blood pressure recheck.   Please bring your medicines with you each time you come.   Medicines may be  Eye drops  Herbal   Vitamins  Pills  Seeing these help [place] take care of you.

## 2014-04-09 DIAGNOSIS — J309 Allergic rhinitis, unspecified: Secondary | ICD-10-CM

## 2014-04-09 HISTORY — DX: Allergic rhinitis, unspecified: J30.9

## 2014-04-09 NOTE — Assessment & Plan Note (Signed)
Discussed with pt that he may not need life-time anticoagulation but he insists in continuing warfarin tx at this time.  Of note, last INR of 5.1, pt missed recheck INR visit with Dr. Elie Confer.  INR during this visit at 2.1. Pt asked to make an appointment to see Dr. Elie Confer on Monday, 4/27 for further discussion of warfarin dosing--pt may need more discussion of indication for anticoagulation.

## 2014-04-09 NOTE — Assessment & Plan Note (Signed)
BP Readings from Last 3 Encounters:  04/08/14 160/80  03/05/14 173/89  02/10/14 203/90    Lab Results  Component Value Date   NA 140 03/05/2014   K 4.1 03/05/2014   CREATININE 1.43* 03/05/2014    Assessment: Blood pressure control:  Not controlled Progress toward BP goal:   Not at goal Comments: His meds were increased to metoprolol ER 50mg  daily and lisinopril 40mg  daily. His BP has come down some but is still elevated. Will start Norvasc 5 mg daily. Pt states that he is able to pick up this new prescription and will fill his pillbox with it.  Plan: Medications:  continue current medications start amlodipine 5mg  daily Educational resources provided: brochure;handout;video Self management tools provided:   Other plans: He will follow up in 2-3 weeks for BP recheck. Pt advised to avoid NSAIDs as they can raise BP

## 2014-04-09 NOTE — Progress Notes (Signed)
   Subjective:    Patient ID: Kevin Castillo, male    DOB: [birth year], 68 y.o.   MRN: [id number]  HPI Mr. Kirkman is a 68 yr old man with PMH of HTN, DM2, CKD stage 3, who presents for evaluation of URI symptoms that have been present for 3 days. He has had rhinorrhea with clear discharge, sneezing, nonproductive cough, and headache. His headache improved with Motrin.  He was started on increased dose of metoprolol from 25mg  daily to 50mg  daily and lisinopril to 40mg  daily but he is not sure if he is taking these new doses. He has a Therapist, sports that comes in once per month to fill his pill box. He did not have his medication bottles with him during this visit.   He missed his appointment with Dr. Elie Confer for INR check but denies melena, blood in his stool, or other bleeding including nosebleeds.    Review of Systems  Constitutional: Negative for fever, chills, diaphoresis, activity change, appetite change and fatigue.       Denies body aches  HENT: Positive for congestion, rhinorrhea and sneezing. Negative for ear pain, nosebleeds, postnasal drip, sinus pressure, sore throat and voice change.   Eyes: Negative for itching.  Respiratory: Positive for cough. Negative for shortness of breath.   Cardiovascular: Negative for chest pain and leg swelling.  Gastrointestinal: Negative for nausea, vomiting, abdominal pain, diarrhea, constipation and blood in stool.  Genitourinary: Negative for dysuria.  Neurological: Positive for headaches. Negative for dizziness and light-headedness.  Hematological: Negative for adenopathy.  Psychiatric/Behavioral: Negative for agitation.       Objective:   Physical Exam  Nursing note and vitals reviewed. Constitutional: He is oriented to person, place, and time. He appears well-developed and well-nourished. No distress.  Hypernasal speech at his baseline   HENT:  Mouth/Throat: Oropharynx is clear and moist. No oropharyngeal exudate.  Bilateral nasal turbinates are pale  and boggy with no discharge No sinus TTP    Cardiovascular: Normal rate and regular rhythm.   Pulmonary/Chest: Effort normal. No respiratory distress. He has no wheezes. He has no rales.  Abdominal: Soft.  Musculoskeletal: He exhibits no edema and no tenderness.  Lymphadenopathy:    He has no cervical adenopathy.  Neurological: He is alert and oriented to person, place, and time.  Skin: Skin is warm and dry. He is not diaphoretic.  Psychiatric: He has a normal mood and affect.          Assessment & Plan:

## 2014-04-09 NOTE — Assessment & Plan Note (Signed)
He has URI symptoms c/w allergic rhinitis.  Rx flonase nasal spray Tylenol 500mg  q6hr PRN for headache (2g/24 hr given hx of EtOH abuse) Pt advised to avoid NSAIDs given CKD and HTN Pt advised to return if symptoms fail to improve.  Pt to return in 2-3 weeks for blood pressure recheck

## 2014-04-09 NOTE — Assessment & Plan Note (Addendum)
Last BMET from 03/05/14 with Cr at baseline, nl K. Pt may need repeat BMET during next visit for Cr and K recheck after increase in lisinopril.  Pt advised to avoid NSAIDs but may need additional counseling.

## 2014-04-10 NOTE — Progress Notes (Signed)
INTERNAL MEDICINE TEACHING ATTENDING ADDENDUM - Valera Vallas, MD: I reviewed and discussed at the time of visit with the resident Dr. Kennerly, the patient's medical history, physical examination, diagnosis and results of tests and treatment and I agree with the patient's care as documented. 

## 2014-04-14 ENCOUNTER — Ambulatory Visit (INDEPENDENT_AMBULATORY_CARE_PROVIDER_SITE_OTHER): Payer: Medicare HMO | Admitting: Pharmacist

## 2014-04-14 DIAGNOSIS — Z86711 Personal history of pulmonary embolism: Secondary | ICD-10-CM

## 2014-04-14 DIAGNOSIS — Z7901 Long term (current) use of anticoagulants: Secondary | ICD-10-CM

## 2014-04-14 LAB — POCT INR: INR: 2.8

## 2014-04-14 NOTE — Patient Instructions (Signed)
Patient instructed to take medications as defined in the Anti-coagulation Track section of this encounter.  Patient instructed to take today's dose.  Patient verbalized understanding of these instructions.    

## 2014-04-14 NOTE — Progress Notes (Signed)
Anti-Coagulation Progress Note  Jaquell Burba is a 68 y.o. male who is currently on an anti-coagulation regimen.    RECENT RESULTS: Recent results are below, the most recent result is correlated with a dose of 60 mg. per week: Lab Results  Component Value Date   INR 2.80 04/14/2014   INR 2.1 04/08/2014   INR 5.10 03/24/2014    ANTI-COAG DOSE: Anticoagulation Dose Instructions as of 04/14/2014     Dorene Grebe Tue Wed Thu Fri Sat   New Dose 10 mg 10 mg 0 mg 10 mg 15 mg 10 mg 10 mg       ANTICOAG SUMMARY: Anticoagulation Episode Summary   Current INR goal 2.0-3.0  Next INR check 05/05/2014  INR from last check 2.80 (04/14/2014)  Weekly max dose   Target end date   [facility] location [facility]  Preferred lab   Send INR reminders to    Indications  History of pulmonary embolus (PE) [V12.55] Long term (current) use of anticoagulants [V58.61]        Comments         ANTICOAG TODAY: Anticoagulation Summary as of 04/14/2014   INR goal 2.0-3.0  Selected INR 2.80 (04/14/2014)  Next INR check 05/05/2014  Target end date    Indications  History of pulmonary embolus (PE) [V12.55] Long term (current) use of anticoagulants [V58.61]      Anticoagulation Episode Summary   [facility]   Preferred lab    Send INR reminders to    Comments       PATIENT INSTRUCTIONS: Patient Instructions  Patient instructed to take medications as defined in the Anti-coagulation Track section of this encounter.  Patient instructed to take today's dose.  Patient verbalized understanding of these instructions.       FOLLOW-UP Return in 3 weeks (on 05/05/2014) for Follow up INR at 3:30PM.  Jorene Guest, III Pharm.D., CACP

## 2014-04-15 NOTE — Progress Notes (Signed)
INTERNAL MEDICINE TEACHING ATTENDING ADDENDUM - Aldine Contes M.D  Duration- indefinte, Indication- PE, INR- 2.8. Agree with Dr. Gladstone Pih recommendations as outlined in his note.

## 2014-04-18 HISTORY — PX: CATARACT EXTRACTION W/ INTRAOCULAR LENS  IMPLANT, BILATERAL: SHX1307

## 2014-05-05 ENCOUNTER — Encounter: Payer: Commercial Managed Care - HMO | Admitting: Internal Medicine

## 2014-05-05 ENCOUNTER — Ambulatory Visit: Payer: Medicare HMO

## 2014-05-28 ENCOUNTER — Ambulatory Visit (INDEPENDENT_AMBULATORY_CARE_PROVIDER_SITE_OTHER): Payer: Medicare HMO | Admitting: Internal Medicine

## 2014-05-28 ENCOUNTER — Encounter: Payer: Self-pay | Admitting: Internal Medicine

## 2014-05-28 ENCOUNTER — Telehealth: Payer: Self-pay | Admitting: Pharmacist

## 2014-05-28 VITALS — BP 159/80 | HR 86 | Temp 97.5°F | Wt 210.8 lb

## 2014-05-28 DIAGNOSIS — N1832 Chronic kidney disease, stage 3b: Secondary | ICD-10-CM

## 2014-05-28 DIAGNOSIS — D3502 Benign neoplasm of left adrenal gland: Secondary | ICD-10-CM

## 2014-05-28 DIAGNOSIS — R911 Solitary pulmonary nodule: Secondary | ICD-10-CM | POA: Insufficient documentation

## 2014-05-28 DIAGNOSIS — N183 Chronic kidney disease, stage 3 unspecified: Secondary | ICD-10-CM

## 2014-05-28 DIAGNOSIS — E119 Type 2 diabetes mellitus without complications: Secondary | ICD-10-CM

## 2014-05-28 DIAGNOSIS — F17200 Nicotine dependence, unspecified, uncomplicated: Secondary | ICD-10-CM

## 2014-05-28 DIAGNOSIS — D3500 Benign neoplasm of unspecified adrenal gland: Secondary | ICD-10-CM

## 2014-05-28 DIAGNOSIS — I10 Essential (primary) hypertension: Secondary | ICD-10-CM

## 2014-05-28 DIAGNOSIS — I25810 Atherosclerosis of coronary artery bypass graft(s) without angina pectoris: Secondary | ICD-10-CM

## 2014-05-28 DIAGNOSIS — Z86711 Personal history of pulmonary embolism: Secondary | ICD-10-CM

## 2014-05-28 DIAGNOSIS — Z7901 Long term (current) use of anticoagulants: Secondary | ICD-10-CM

## 2014-05-28 LAB — BASIC METABOLIC PANEL WITH GFR
BUN: 18 mg/dL (ref 6–23)
CHLORIDE: 101 meq/L (ref 96–112)
CO2: 29 meq/L (ref 19–32)
Calcium: 8.7 mg/dL (ref 8.4–10.5)
Creat: 1.54 mg/dL — ABNORMAL HIGH (ref 0.50–1.35)
GFR, Est African American: 53 mL/min — ABNORMAL LOW
GFR, Est Non African American: 46 mL/min — ABNORMAL LOW
Glucose, Bld: 185 mg/dL — ABNORMAL HIGH (ref 70–99)
Potassium: 3.6 mEq/L (ref 3.5–5.3)
Sodium: 140 mEq/L (ref 135–145)

## 2014-05-28 LAB — POCT GLYCOSYLATED HEMOGLOBIN (HGB A1C): Hemoglobin A1C: 6.6

## 2014-05-28 LAB — POCT INR: INR: 2.5

## 2014-05-28 LAB — GLUCOSE, CAPILLARY: Glucose-Capillary: 168 mg/dL — ABNORMAL HIGH (ref 70–99)

## 2014-05-28 NOTE — Patient Instructions (Signed)
Dear Kevin Castillo, [facility] ME ALL YOUR MEDICATIONS NEXT Monday 06/02/14, WE CAN THEN CHECK YOUR BLOOD PRESSURE AND MAKE CHANGES AS NEEDED  We need to discuss your coumadin further with Dr. Elie Confer and your PCP. We will check an INR today and look through further records.   We will check your hemoglobin a1c today as well.

## 2014-05-28 NOTE — Progress Notes (Signed)
I have never seen Kevin Castillo as I suspect I was recently assigned as his PCP.  If he is making an informed decision to stop the coumadin at this point I have no problems with that decision.  What I will ask Dr. Eula Fried to address at the follow-up visit is the pulmonary nodule that was not followed up.  Although this could represent an infarct, especially in the setting of an acute PE, it requires a repeat CT scan to assess for any change in size.  This should be discussed with the patient upon return, and the study ordered at that time if he is in agreement.

## 2014-05-28 NOTE — Addendum Note (Signed)
Addended by: Wilber Oliphant on: 05/28/2014 06:19 PM   Modules accepted: Orders

## 2014-05-28 NOTE — Assessment & Plan Note (Signed)
bmet today 

## 2014-05-28 NOTE — Assessment & Plan Note (Signed)
Is to follow up with Dr. Doylene Canard yearly. Unclear if this has occurred. Was referred in December but may have missed appointments.   -will touch base with Dr. Merrilee Jansky office tomorrow

## 2014-05-28 NOTE — Progress Notes (Signed)
Subjective:   Patient ID: Kevin Castillo male   DOB: 06-16-1946 68 y.o.   MRN: [id number]  HPI: Kevin Castillo is a 68 y.o. African American male with PMH 3 vessel CAD s/p CABG x4 07/2005, CKD 3, DM2, HTN, DVT and PE 2012 on coumadin, and chronic tobacco use presenting to opc today for BP follow up visit.  He was last seen in opc by Dr. Hayes Ludwig in April 2015 and was to return in May, however, unfortunately he missed his appointments since he was having b/l cataract surgery that month.  Today, in regards to his BP: it remains elevated 159/80 which is stable from his last visit. He denies any headaches, chest pain, light-headedness.  He has blurry vision that is chronic from the cataracts and is improving since surgery. He did not bring in his medications today and is confused in regards to the specific names and doses of his medications.  He reports taking 5-6 pills every morning and can recognized names of the medicine when I read them to him, however, he is unclear of the doses.  He takes his medications by himself and has no assistance at home with the medications but he uses a pill box.  He does recall a 5mg  tablet (which is likely his norvasc that was started on last visit) and says he takes 20mg  of a pill (which is likely his lisinopril old dose that he is referring to, thus, it is possible that he has not increased his dose to 40mg  as prescribed on last visits), although he then later does recall a dose was to be doubled.  He is also supposed to be on Toprol XL 50mg  qd, which he thinks he takes. It is very difficult in this situation to adjust his medications, therefore, we will try to arrange a follow up visit early next week when he can arrive and bring in ALL his medications with him to go through them one by one and get him back on track for BP control.   The other concern noted from chart review today is that [name] apparently had ONE unprovoked DVT and PE (per CT chest) in 01/2011  when he was admitted to the hospital by Dr. Doylene Canard.  Per his discharge summary, he was admitted for abdominal discomfort and R sided chest pain that visit with cough and was found to have RLE DVT per duplex 02/14/2011--R femoral, R popliteal, R post tibial, and R peroneal, RLL PNA with small effusion on CT abdomen , and CT angio chest 02/12/11 showed mild subacute to chronic R sided PE with moderate R pleural effusion, and 80mm pulmonary nodule in post LLL (that was to be followed up by CT chest 2-3 months but has not been done). He was discharged on coumadin on that visit and was to follow up with Dr. Doylene Canard after discharge.  Unclear if this has been done for quite some time. He appears to be on coumadin since 2012 with no other DVT/PE occurrences that I can find on epic review and patient does not recall any recurrences. He claims to be okay with stopping coumadin at this time.   In regards to his CAD--he is supposed to have yearly nuclear stress tests per Dr. Doylene Canard as per Dr. Bernadene Bell note 11/2013. Unclear if this has been done recently. He continues to smoke cigarettes, DM2 well controlled, BP remains elevated.   Past Medical History  Diagnosis Date  . Coronary artery disease   . Diabetes mellitus without  complication   . HTN (hypertension)   . Gout   . Obesity   . Alcohol abuse   . Tobacco abuse   . History of pulmonary embolism   . Hypercholesteremia    Current Outpatient Prescriptions  Medication Sig Dispense Refill  . amLODipine (NORVASC) 5 MG tablet Take 1 tablet (5 mg total) by mouth daily.  30 tablet  3  . atorvastatin (LIPITOR) 20 MG tablet Take 1 tablet (20 mg total) by mouth daily.  30 tablet  11  . fluticasone (FLONASE) 50 MCG/ACT nasal spray Place 2 sprays into both nostrils daily.  16 g  2  . folic acid (FOLVITE) 1 MG tablet Take 1 tablet (1 mg total) by mouth daily.  30 tablet  2  . lisinopril (PRINIVIL,ZESTRIL) 40 MG tablet Take 1 tablet (40 mg total) by mouth daily.  30  tablet  2  . metoprolol succinate (TOPROL XL) 50 MG 24 hr tablet Take 1 tablet (50 mg total) by mouth daily.  30 tablet  2  . thiamine (VITAMIN B-1) 100 MG tablet Take 1 tablet (100 mg total) by mouth daily.  30 tablet  2  . warfarin (COUMADIN) 10 MG tablet Take 1.5 tablets (15 mg) Mon, Tues, Weds, Thurs. Take 1 tablet Fri, Sat, Sun.      . acetaminophen (TYLENOL GO TABS EXTRA STRENGTH) 500 MG chewable tablet Chew 1 tablet (500 mg total) by mouth every 6 (six) hours as needed for pain (headache).  30 tablet  0  . nicotine (NICODERM CQ - DOSED IN MG/24 HR) 7 mg/24hr patch Place 1 patch (7 mg total) onto the skin daily.  30 patch  0   No current facility-administered medications for this visit.   Family History  Problem Relation Age of Onset  . Heart disease Father   . COPD Father   . Diabetes Mellitus II Mother    History   Social History  . Marital Status: Married    Spouse Name: N/A    Number of Children: N/A  . Years of Education: 10   Occupational History  . Retired     used to drive a truck locally   [employer]  . Smoking status: Current Some Day Smoker -- 0.25 packs/day for 50 years    Types: Cigarettes  . Smokeless tobacco: None     Comment: trying to quit/ PATIENT STATES HE HAS CUT BACK  . Alcohol Use: 0.0 oz/week     Comment: Drinks 1 pint of gin over the weekend but does not drink everyday. Denies hx of blackouts, seizures, tremors.  . Drug Use: No  . Sexual Activity: None   Other Topics Concern  . None   Social History Narrative   Married, lives with his wife, son and grandchild. He has 7 sons and 2 daughters. Only one son and one daughter live here in [place].    Review of Systems:  Constitutional:  Denies fever, chills  HEENT:  Denies congestion  Respiratory:  Denies SOB  Cardiovascular:  Denies chest pain and leg swelling.   Gastrointestinal:  Denies nausea, vomiting, abdominal pain  Genitourinary:  Denies dysuria  Musculoskeletal:  Walks  without assistance   Skin:  Denies pallor, rash and wound.   Neurological:  Denies syncope   Objective:  Physical Exam: Filed Vitals:   05/28/14 0947  BP: 159/80  Pulse: 86  Temp: 97.5 F (36.4 C)  TempSrc: Oral  Weight: 210 lb 12.8 oz (95.618 kg)  SpO2:  97%   Vitals reviewed. General: sitting in chair, NAD HEENT: EOMI, b/l conjunctival injection, reports blurry vision  Cardiac: RRR Pulm: clear to auscultation bilaterally, no wheezes, rales, or rhonchi Abd: soft, nondistended, BS present Ext: warm and well perfused, moving all 4 extremities, -tenderness to palpation Neuro: alert and oriented X3, strength and sensation to light touch equal in bilateral upper and lower extremities  Assessment & Plan:  Discussed with Dr. Ellwood Dense and Dr. Elie Confer BMET and INR check today RTC Monday with all meds

## 2014-05-28 NOTE — Assessment & Plan Note (Addendum)
D/c coumadin  -please see above discussion under long term anticoagulation plan

## 2014-05-28 NOTE — Assessment & Plan Note (Signed)
Lab Results  Component Value Date   HGBA1C 6.6 05/28/2014   HGBA1C 6.1 03/05/2014   HGBA1C 6.2 11/11/2013    Assessment: Diabetes control: good control (HgbA1C at goal) Progress toward A1C goal:  at goal  Plan: Medications:  on no medications Home glucose monitoring: Frequency:   Timing:   Instruction/counseling given: reminded to bring blood glucose meter & log to each visit and reminded to bring medications to each visit Educational resources provided: brochure

## 2014-05-28 NOTE — Assessment & Plan Note (Signed)
  Assessment: Progress toward smoking cessation:  smoking the same amount (5 cigs/day) Barriers to progress toward smoking cessation:    Comments: still smoking, not motivation to quit at this time, has not filled patches but is cutting down  Plan: Instruction/counseling given:  I counseled patient on the dangers of tobacco use, advised patient to stop smoking, and reviewed strategies to maximize success. Educational resources provided:  [employer] [employer] account manager) brochure Self management tools provided:  smoking cessation plan ([employer] Quit Plan) Medications to assist with smoking cessation:  None Patient agreed to the following self-care plans for smoking cessation: go to the [employer] (www.quitlinenc.com);call [facility] (1-800-QUIT-NOW)

## 2014-05-28 NOTE — Assessment & Plan Note (Signed)
BP Readings from Last 3 Encounters:  05/28/14 159/80  04/08/14 160/80  03/05/14 173/89   Lab Results  Component Value Date   NA 140 03/05/2014   K 4.1 03/05/2014   CREATININE 1.43* 03/05/2014   Assessment: Blood pressure control: moderately elevated Progress toward BP goal:  unchanged Comments: remains elevated, confused about his medications, does not recall names but can recognize them when read to him, but does not know his doses. He will need EXTENSIVE education on his medication and doses and using his pill box. Therefore, I have asked him to RTC on Monday with ALL of his medications and we will review each in detail and try to help set up his pill box. As such, once seeing exactly what he is taking, I can then properly adjust his BP regimen to improve BP control.   Plan: Medications:  continue current medications norvasc 5mg , lisinopril 40mg , and toprol xl 50mg   Educational resources provided: brochure Self management tools provided:   Other plans: RTC Monday with ALL medications, no changes today. BMET

## 2014-05-28 NOTE — Assessment & Plan Note (Addendum)
Based on my chart review and looking over prior assessment and plans of previous providers. I would be in favor of discontinuing coumadin in this gentleman today given only 1 occurrence of RLE DVT and PE in 2012 that can be found. He is also on multiple medications and is confused about which ones he takes, thus one less medication that he does not seem to need will be beneficial.  Apparently, he has not been keen on discontinuing coumadin in the past, however, with my discussion today, he is willing to discontinue coumadin if appropriate. I also discussed the case with both Dr. Ellwood Dense and Dr. Elie Confer.   Risk of bleeding: HAS-BLED score 5, ~12.5% bleeding rate. He is elderly, has CKD, is smoker, blurry vision (may cause falls), confused with current medications already.   Therefore, while life long anticoagulation could continue for Kevin Castillo, it may be best to give him a trial off coumadin at this time given the above discussion and risks of bleeding. I have explained this to the patient who in agreement and will also continue this discussion on Monday on his return appointment. If he wishes to stay off coumadin, then would recommend 3 month follow up and discussing plan for any further anticoagulation if needed.   -will discuss with pcp, Dr. Eppie Gibson prior to discontinuation, but otherwise I would favor stopping it as per my discussion above. Of note, pcp has never seen the patient in opc thus far.   -INR was checked today and he met with Dr. Elie Confer who for now has him on coumadin $RemoveBef'10mg'LgBBSQwgol$  daily except for Sunday, he is to see me on Monday with all his medications and I will reiterate this plan with him again -will also try to touch base with Dr. Doylene Canard who has apparently been following his INR in the past to notify him of the change as well   Addendum 05/29/14: -discussed with Kevin Castillo on the phone this morning. He is agreeable to discontinuing coumadin at this time. -I also called Dr. Merrilee Jansky office  this morning who will get in touch with him in regards to follow up appt and I informed them as well of d/c coumadin.

## 2014-05-28 NOTE — Assessment & Plan Note (Addendum)
Was recommended to have follow-up by chest CT in April or may 2012 but does not appear to have been done.   -will order ct chest without contrast at this time for follow up  Addendum 06/16/14: Resolved prior opacity in posterior left lower lobe as described above

## 2014-05-28 NOTE — Telephone Encounter (Signed)
Called to Kevin Castillo to discuss the patient with Dr. Eula Fried. Her review of his EHR indicates his index VTE episode may place him at a point of clinical equipoise insofar as continuing warfarin versus discontinuation. Dr. Eula Fried is going to continue to review his medical record to determine if indeed this has been his only VTE episode. We will see the patient together on Monday 15-JUN-15 to determine if patient will continue or discontinue warfarin. Instructed patient to continue current regimen of 60mg /wk (10mg  PO QD except on Sunday--NO WARFARIN on Sundays.

## 2014-05-30 NOTE — Addendum Note (Signed)
Addended by: Wilber Oliphant on: 05/30/2014 04:40 PM   Modules accepted: Orders

## 2014-06-02 ENCOUNTER — Ambulatory Visit (INDEPENDENT_AMBULATORY_CARE_PROVIDER_SITE_OTHER): Payer: Commercial Managed Care - HMO | Admitting: Internal Medicine

## 2014-06-02 ENCOUNTER — Ambulatory Visit (HOSPITAL_COMMUNITY): Payer: Medicare HMO

## 2014-06-02 ENCOUNTER — Ambulatory Visit (INDEPENDENT_AMBULATORY_CARE_PROVIDER_SITE_OTHER): Payer: Commercial Managed Care - HMO | Admitting: Pharmacist

## 2014-06-02 ENCOUNTER — Telehealth: Payer: Self-pay | Admitting: *Deleted

## 2014-06-02 ENCOUNTER — Encounter: Payer: Self-pay | Admitting: Internal Medicine

## 2014-06-02 VITALS — BP 186/90 | HR 77 | Temp 98.4°F | Ht 67.0 in | Wt 215.3 lb

## 2014-06-02 DIAGNOSIS — R351 Nocturia: Secondary | ICD-10-CM

## 2014-06-02 DIAGNOSIS — I25810 Atherosclerosis of coronary artery bypass graft(s) without angina pectoris: Secondary | ICD-10-CM

## 2014-06-02 DIAGNOSIS — N400 Enlarged prostate without lower urinary tract symptoms: Secondary | ICD-10-CM | POA: Insufficient documentation

## 2014-06-02 DIAGNOSIS — Z86711 Personal history of pulmonary embolism: Secondary | ICD-10-CM

## 2014-06-02 DIAGNOSIS — Z7901 Long term (current) use of anticoagulants: Secondary | ICD-10-CM

## 2014-06-02 DIAGNOSIS — R911 Solitary pulmonary nodule: Secondary | ICD-10-CM

## 2014-06-02 DIAGNOSIS — I10 Essential (primary) hypertension: Secondary | ICD-10-CM

## 2014-06-02 DIAGNOSIS — F17200 Nicotine dependence, unspecified, uncomplicated: Secondary | ICD-10-CM

## 2014-06-02 HISTORY — DX: Benign prostatic hyperplasia without lower urinary tract symptoms: N40.0

## 2014-06-02 LAB — POCT INR: INR: 1.1

## 2014-06-02 MED ORDER — AMLODIPINE BESYLATE 10 MG PO TABS: 10.0000 mg | ORAL_TABLET | Freq: Every day | ORAL | Status: DC

## 2014-06-02 MED ORDER — METOPROLOL SUCCINATE ER 50 MG PO TB24
50.0000 mg | ORAL_TABLET | Freq: Every day | ORAL | Status: DC
Start: 2014-06-02 — End: 2014-10-17

## 2014-06-02 NOTE — Telephone Encounter (Signed)
CALLED AND SPOKE WITH PATIENT. HE MISSED XRAY APPOINTMENT THIS MORNING. HIS NEW APPOINTMENT IS 6-17-015 @ 10:30AM TO ARRIVE AT [facility] BY 10:15AM.  THERE FORE PATIENT IS AWARE OF THIS APPOINTMENT.  LELA STURDIVANT NTII 6-15-015

## 2014-06-02 NOTE — Assessment & Plan Note (Signed)
After continued discussion with Dr. Ellwood Dense and Dr. Elie Confer after last weeks visit, a long discussion for long term anticoagulation was continued today in detail. I explained the risks vs. benefits in detail with [name] today in regards to his estimated 12.5% bleeding rate, confusion over his medications, and hx of missing some appts in opc for INR checks vs. Benefits of saying on coumadin including possible recurrence of DVT/PE if off anticoagulation. After this discussion, Mr. [name] wishes to RESUME coumadin today, he says he does not want to risk having another clot. He also met with Dr. Elie Confer and had a similar discussion during this visit. He was able to verbally relay the risk and benefit discussion points with me today as well.    Per up to date: The duration of anticoagulation therapy varies with the clinical setting as well as with patient values and preferences.  ?Patients with a first thromboembolic event in the context of a reversible or time-limited risk factor (eg, trauma, surgery) should be treated for three months. ?Patients with a first idiopathic thromboembolic event should be treated for a minimum of three months. Following this, all patients should be evaluated for the risk/benefit ratio of long-term therapy. ?Indefinite therapy is preferred in patients with a first unprovoked episode of proximal DVT who have a greater concern about recurrent VTE and a relatively lower concern about the burdens of long-term anticoagulant therapy.  Of note, coumadin was discontinued last week after our office visit and after also meeting with Dr. Doylene Canard.   -As such, INR today was 1.1 (given recent discontinuation). Coumadin was re-dosed today by Dr. Elie Confer to be 46m daily with follow up in opc in 2 weeks.  INR check regular follow ups was strongly encouraged to patient today.  -I would also encourage continued discussion in regards to anti-coagulation with the pcp in the future, perhaps on 3  months follow up, as risks of bleeding in this individual along with compliance with medications and appts may be an issue in the future. Additonally, perhaps consideration may be given to some of the new agents, although his CKD may prevent such therapies, but at this time, his GFR is 53 and perhaps my improve with improved BP control. -I will let Dr. BEllwood Dense Dr. KEppie Gibson(new pcp), and Dr. KDoylene Canardof the change in medication and decision of the patient

## 2014-06-02 NOTE — Progress Notes (Addendum)
Case discussed with Dr. Eula Fried at the time of the visit.  We reviewed the resident's history and exam and pertinent patient test results.  I agree with the assessment, diagnosis, and plan of care documented in the resident's note.   Anticoagulation: I personally reviewed the chart of this patient. He has a history of unprovoked proximal DVT with PE, which per uptodate needs indefinite anticoagulation unless the patient is a significant bleeding risk. If all the pros and cons have been explained to the patient, I agree with whatever he chooses to do. I also discussed this extensively with Dr Eula Fried and Dr Elie Confer.

## 2014-06-02 NOTE — Progress Notes (Signed)
Case discussed with Dr. Qureshi soon after the resident saw the patient.  We reviewed the resident's history and exam and pertinent patient test results.  I agree with the assessment, diagnosis, and plan of care documented in the resident's note. 

## 2014-06-02 NOTE — Assessment & Plan Note (Signed)
  Assessment: Progress toward smoking cessation:  smoking the same amount Barriers to progress toward smoking cessation:    Comments: 4-5cigs/day, wants to stop on his own, says he has cut back  Plan: Instruction/counseling given:  I counseled patient on the dangers of tobacco use, advised patient to stop smoking, and reviewed strategies to maximize success. Educational resources provided:    Self management tools provided:    Medications to assist with smoking cessation:  None Patient agreed to the following self-care plans for smoking cessation: call [facility] (1-800-QUIT-NOW) (Has information)

## 2014-06-02 NOTE — Progress Notes (Signed)
Anti-Coagulation Progress Note  Kevin Castillo is a 68 y.o. male who is currently on an anti-coagulation regimen.    RECENT RESULTS: Recent results are below, the most recent result is correlated with a dose of 60 mg. per week: Lab Results  Component Value Date   INR 1.1 06/02/2014   INR 2.5 05/28/2014   INR 2.80 04/14/2014    ANTI-COAG DOSE: Anticoagulation Dose Instructions as of 06/02/2014     Dorene Grebe Tue Wed Thu Fri Sat   New Dose 10 mg 10 mg 10 mg 10 mg 10 mg 10 mg 10 mg       ANTICOAG SUMMARY: Anticoagulation Episode Summary   Current INR goal 2.0-3.0  Next INR check 06/16/2014  INR from last check 1.1! (06/02/2014)  Weekly max dose   Target end date   [facility] check [facility]  Preferred lab   Send INR reminders to    Indications  History of pulmonary embolus (PE) [V12.55] Long term (current) use of anticoagulants [V58.61]        Comments         ANTICOAG TODAY: Anticoagulation Summary as of 06/02/2014   INR goal 2.0-3.0  Selected INR 1.1! (06/02/2014)  Next INR check 06/16/2014  Target end date    Indications  History of pulmonary embolus (PE) [V12.55] Long term (current) use of anticoagulants [V58.61]      Anticoagulation Episode Summary   [facility]R check location [facility]   Preferred lab    Send INR reminders to    Comments       PATIENT INSTRUCTIONS: Patient Instructions  Patient instructed to take medications as defined in the Anti-coagulation Track section of this encounter.  Patient instructed to take today's dose.  Patient verbalized understanding of these instructions.       FOLLOW-UP Return in 2 weeks (on 06/16/2014) for Follow up INR at 1030h.  Jorene Guest, III Pharm.D., CACP

## 2014-06-02 NOTE — Assessment & Plan Note (Addendum)
Reports only at night around 2am, resolved by day time, several times a night, but says he has good flow. Unclear etiology, possibly 2/2 BPH vs. Increase fluid intake before bed. He refused prostate exam in opc today, would feel more comfortable with male physician on next visit when he is more prepared. Could be secondary to DM, although well controlled. A1C last visit 6.6.   -follow up on next visit with pcp -asked patient if worsening or bothersome to call or return to opc sooner for further evaluation and dedicated visit.

## 2014-06-02 NOTE — Assessment & Plan Note (Signed)
BP Readings from Last 3 Encounters:  06/02/14 186/90  05/28/14 159/80  04/08/14 160/80   Lab Results  Component Value Date   NA 140 05/28/2014   K 3.6 05/28/2014   CREATININE 1.54* 05/28/2014   Assessment: Blood pressure control: moderately elevated Progress toward BP goal:  deteriorated Comments: remained elevated on recheck, brought almost all his medications in today.   Plan: Medications:  increase norvasc to 10mg  daily, pt was only taking toprol xl 25mg  despite recommended dose of 50mg  xl--encouraged taking prescribed dose, and continue lisinopril 40mg  qd Educational resources provided:   Self management tools provided:   Other plans: I relabled his pill box today with the days of the week, we put in his medications in two respective days to demonstrate (tuesday and Wednesday), I fixed the labels on his pill bottles to show updated doses, the medication refills were sent to the correct pharmacy ([facility] prefers [facility]), and we used the teach back method several times today in regards to medications, and doses.   RTC 2 weeks for recheck.

## 2014-06-02 NOTE — Progress Notes (Signed)
Case discussed with Dr. Qureshi at the time of the visit.  We reviewed the resident's history and exam and pertinent patient test results.  I agree with the assessment, diagnosis, and plan of care documented in the resident's note. 

## 2014-06-02 NOTE — Progress Notes (Signed)
Subjective:   Patient ID: [place] male   DOB: December 26, 1945 68 y.o.   MRN: [id number]  HPI: Mr.Lizandro Siddiqi is a 68 y.o. African American male with hx of 1 DVT/PE 2012 on coumadin since then (briefly stopped last week), uncontrolled HTN, CAD s/p CABG, and well controlled DM2 who presents to [facility] today for routine follow up visit.   BP: remains elevated: 180s/90s. He did bring almost all of his medications per my request today except that he did leave his lisinopril bottle at home but says he is taking that daily, $RemoveBef'40mg'HznfGoIWIY$ . He is also on norvasc $RemoveBe'5mg'MdxADtdNr$  daily, and should be on Toprol-XL $RemoveBefo'50mg'MBmuOHxOEzf$  daily, but has only been taking $RemoveBef'25mg'BjQuohkmct$  daily per his bottle. He also brought his pill box today that he fills on his own, but the days were not labeled and he only had two boxes full at this time with his medications.   DVT/PE--after another through discussion today and reviewing all the risks of bleeding vs. benefit of staying on coumadin, Mr. Otterson whishes to RESUME coumadin again today. He says he does not want to risk having another clot and understands the risk of bleeding and is willing to take that risk. He also met with Dr. Elie Confer in [place] today who also had a similar discussion and his dose was resumed today with instructions on proper need for follow up and dosing.   Pulm nodule--CT chest scheduled for today   Past Medical History  Diagnosis Date  . Coronary artery disease   . Diabetes mellitus without complication   . HTN (hypertension)   . Gout   . Obesity   . Alcohol abuse   . Tobacco abuse   . History of pulmonary embolism   . Hypercholesteremia    Current Outpatient Prescriptions  Medication Sig Dispense Refill  . amLODipine (NORVASC) 10 MG tablet Take 1 tablet (10 mg total) by mouth daily.  30 tablet  3  . CRESTOR 10 MG tablet       . fluticasone (FLONASE) 50 MCG/ACT nasal spray Place 2 sprays into both nostrils daily.  16 g  2  . folic acid (FOLVITE) 1 MG tablet Take 1 tablet (1 mg  total) by mouth daily.  30 tablet  2  . lisinopril (PRINIVIL,ZESTRIL) 40 MG tablet Take 1 tablet (40 mg total) by mouth daily.  30 tablet  2  . metoprolol succinate (TOPROL-XL) 50 MG 24 hr tablet Take 1 tablet (50 mg total) by mouth daily.  30 tablet  2  . thiamine (VITAMIN B-1) 100 MG tablet Take 1 tablet (100 mg total) by mouth daily.  30 tablet  2  . warfarin (COUMADIN) 10 MG tablet Take 1.5 tablets (15 mg) Mon, Tues, Weds, Thurs. Take 1 tablet Fri, Sat, Sun.       No current facility-administered medications for this visit.   Family History  Problem Relation Age of Onset  . Heart disease Father   . COPD Father   . Diabetes Mellitus II Mother    History   Social History  . Marital Status: Married    Spouse Name: N/A    Number of Children: N/A  . Years of Education: 10   Occupational History  . Retired     used to drive a truck locally   [place]  . Smoking status: Current Some Day Smoker -- 0.25 packs/day for 50 years    Types: Cigarettes  . Smokeless tobacco: None     Comment: trying  to quit/ PATIENT STATES HE HAS CUT BACK  . Alcohol Use: 0.0 oz/week     Comment: Drinks 1 pint of gin over the weekend but does not drink everyday. Denies hx of blackouts, seizures, tremors.  . Drug Use: No  . Sexual Activity: None   Other Topics Concern  . None   Social History Narrative   Married, lives with his wife, son and grandchild. He has 7 sons and 2 daughters. Only one son and one daughter live here in [place].    Review of Systems:  Constitutional:  Denies fever, chills  HEENT:  Congestion, blurry vision  Respiratory:  Denies wheezing  Cardiovascular:  Denies chest pain  Gastrointestinal:  Denies nausea, vomiting, abdominal pain  Genitourinary:  Nocturia  Musculoskeletal:  Denies gait problem  Neurological:  Denies falls   Objective:  Physical Exam: Filed Vitals:   06/02/14 1114  BP: 186/90  Pulse: 77  Temp: 98.4 F (36.9 C)  TempSrc: Oral  Height:  $Remove'5\' 7"'WuyeviS$  (1.702 m)  Weight: 215 lb 4.8 oz (97.659 kg)  SpO2: 100%   Vitals reviewed. General: sitting in chair, NAD HEENT: EOMI, watery eyes, b/l conjunctival injection Cardiac: RRR Pulm: clear to auscultation bilaterally, no wheezes, rales, or rhonchi Abd: soft, nondistended, BS present Ext: warm and well perfused, moving all 4 extremities Neuro: alert and oriented X3, walks without assistance  Assessment & Plan:  Discussed with Dr. Marinda Elk RESUMED coumadin today, $RemoveBeforeD'10mg'OsoiqeaKRCYZrD$  daily, f/u Dr. Elie Confer 2 weeks HTN--increased norvasc to $RemoveBe'10mg'qoirJGPgz$ , continue toprol xl 50, and lisinopril 40

## 2014-06-02 NOTE — Patient Instructions (Signed)
General Instructions: You have decided to RESTART Coumadin today--Please START taking 10mg  of warfarin every day and follow up with Dr. Elie Confer on 06/16/14 (2 weeks from now) at 130pm. Please also return to clinic for a follow up appointment at that time.   You understand the risks vs. Benefits of staying on coumadin and possible bleeding. If you notice any blood in your urine or stool, please call us right away and go to emergency room if you cannot be seen in clinic [phone]  We changed your blood pressure medicine today. You should be on: Lisinopril 40mg  daily Norvasc NOW 10mg  DAILY Metoprolol 50mg  DAILY  Please consider stopping smoking, you can call 1800quitnow. You would like to stop on your own but can always ask for assistance if needed.   Thank you for bringing your medicines today. This helps [place] keep you safe from mistakes.   Progress Toward Treatment Goals:  Treatment Goal 06/02/2014  Hemoglobin A1C at goal  Blood pressure deteriorated  Stop smoking smoking the same amount    Self Care Goals & Plans:  Self Care Goal 06/02/2014  Manage my medications -  Monitor my health -  Eat healthy foods -  Be physically active -  Stop smoking call [facility] (1-800-QUIT-NOW)  Meeting treatment goals -    No flowsheet data found.   Care Management & Community Referrals:  No flowsheet data found.

## 2014-06-02 NOTE — Assessment & Plan Note (Signed)
After further discussion with patient today, reviewing risks of bleeding, compliance, confusion with medications vs. Benefits of staying on anticoagulation, patient wishes to restart coumadin today.   Mr. Hohensee also met with Dr. Elie Confer in [place] today who had similar discussion. INR was checked and coumadin was dosed. Compliance was strongly encouraged.   -resume coumadin 06/02/14

## 2014-06-02 NOTE — Patient Instructions (Signed)
Patient instructed to take medications as defined in the Anti-coagulation Track section of this encounter.  Patient instructed to take today's dose.  Patient verbalized understanding of these instructions.    

## 2014-06-02 NOTE — Assessment & Plan Note (Signed)
Follows with Dr. Doylene Canard, was seen last week.   -will request records for pcp to review

## 2014-06-02 NOTE — Assessment & Plan Note (Signed)
CT chest to be done today after visit Smoking cessation strongly encouraged

## 2014-06-04 ENCOUNTER — Ambulatory Visit (HOSPITAL_COMMUNITY)
Admission: RE | Admit: 2014-06-04 | Discharge: 2014-06-04 | Disposition: A | Discharge status: Home / Self Care | Payer: Medicare HMO | Source: Ambulatory Visit | Attending: Internal Medicine | Admitting: Internal Medicine

## 2014-06-04 ENCOUNTER — Encounter (HOSPITAL_COMMUNITY): Payer: Self-pay

## 2014-06-04 DIAGNOSIS — J984 Other disorders of lung: Secondary | ICD-10-CM | POA: Insufficient documentation

## 2014-06-04 DIAGNOSIS — R911 Solitary pulmonary nodule: Secondary | ICD-10-CM | POA: Insufficient documentation

## 2014-06-04 DIAGNOSIS — D3500 Benign neoplasm of unspecified adrenal gland: Secondary | ICD-10-CM | POA: Insufficient documentation

## 2014-06-16 ENCOUNTER — Ambulatory Visit (INDEPENDENT_AMBULATORY_CARE_PROVIDER_SITE_OTHER): Payer: Medicare HMO | Admitting: Internal Medicine

## 2014-06-16 ENCOUNTER — Ambulatory Visit (INDEPENDENT_AMBULATORY_CARE_PROVIDER_SITE_OTHER): Payer: Medicare HMO | Admitting: Pharmacist

## 2014-06-16 ENCOUNTER — Encounter: Payer: Self-pay | Admitting: Internal Medicine

## 2014-06-16 ENCOUNTER — Ambulatory Visit: Payer: Commercial Managed Care - HMO | Admitting: Internal Medicine

## 2014-06-16 VITALS — BP 194/96 | HR 87 | Temp 97.8°F | Ht 67.0 in | Wt 211.8 lb

## 2014-06-16 DIAGNOSIS — R911 Solitary pulmonary nodule: Secondary | ICD-10-CM

## 2014-06-16 DIAGNOSIS — E7800 Pure hypercholesterolemia, unspecified: Secondary | ICD-10-CM

## 2014-06-16 DIAGNOSIS — I10 Essential (primary) hypertension: Secondary | ICD-10-CM

## 2014-06-16 DIAGNOSIS — F1010 Alcohol abuse, uncomplicated: Secondary | ICD-10-CM

## 2014-06-16 DIAGNOSIS — D3502 Benign neoplasm of left adrenal gland: Secondary | ICD-10-CM

## 2014-06-16 DIAGNOSIS — Z7901 Long term (current) use of anticoagulants: Secondary | ICD-10-CM

## 2014-06-16 DIAGNOSIS — Z86711 Personal history of pulmonary embolism: Secondary | ICD-10-CM

## 2014-06-16 DIAGNOSIS — J301 Allergic rhinitis due to pollen: Secondary | ICD-10-CM

## 2014-06-16 DIAGNOSIS — E119 Type 2 diabetes mellitus without complications: Secondary | ICD-10-CM

## 2014-06-16 HISTORY — DX: Benign neoplasm of left adrenal gland: D35.02

## 2014-06-16 MED ORDER — VITAMIN B-1 100 MG PO TABS: 100.0000 mg | ORAL_TABLET | Freq: Every day | ORAL | Status: DC

## 2014-06-16 MED ORDER — LISINOPRIL 40 MG PO TABS: 40.0000 mg | ORAL_TABLET | Freq: Every day | ORAL | Status: DC

## 2014-06-16 MED ORDER — FLUTICASONE PROPIONATE 50 MCG/ACT NA SUSP: 2.0000 | Freq: Every day | NASAL | Status: DC

## 2014-06-16 MED ORDER — FOLIC ACID 1 MG PO TABS: 1.0000 mg | ORAL_TABLET | Freq: Every day | ORAL | Status: DC

## 2014-06-16 MED ORDER — ROSUVASTATIN CALCIUM 10 MG PO TABS: 10.0000 mg | ORAL_TABLET | Freq: Every day | ORAL | Status: DC

## 2014-06-16 NOTE — Assessment & Plan Note (Signed)
Refilled Crestor today, pt states that he has been out Friday, 06/13/14.

## 2014-06-16 NOTE — Assessment & Plan Note (Signed)
Refilled Flonase prescription today.

## 2014-06-16 NOTE — Assessment & Plan Note (Addendum)
Lab Results  Component Value Date   HGBA1C 6.6 05/28/2014   HGBA1C 6.1 03/05/2014   HGBA1C 6.2 11/11/2013     Assessment: Diabetes control: good control (HgbA1C at goal) Progress toward A1C goal:  at goal   Plan: Medications:  Continue to control with diet and exercise  Instruction/counseling given: reminded to bring medications to each visit Educational resources provided: handout Self management tools provided:   Other plans: A1c in 3 mo

## 2014-06-16 NOTE — Assessment & Plan Note (Addendum)
Incidental L adrenal adenoma finding ~60mm.  Per up to date, For incidentalomas with a benign appearance on imaging, repeat imaging after 6 to 12 months should be performed to reevaluate for possible malignancy [5]. The decision to obtain additional images (eg, at 6, 12, and 24 months after the initial image) and the type of image obtained (eg, computed tomography [CT] or magnetic resonance imaging [MRI]) should be guided by the individual clinical circumstance, imaging phenotype, and clinical judgment.  Based on recent grand rounds lecture, I discussed the HU with Dr. Tery Sanfilippo on the phone today which is 8.33. Generally, if HU of mass <10 very likely to be benign.   -recommend follow up in 6-12 months. Report was forwarded to pcp as well. Unable to reach patient over the phone, left message to review results with Mr. Ohman. -letter sent to patient with results as well

## 2014-06-16 NOTE — Assessment & Plan Note (Signed)
Pt endorses compliance with Coumadin. He has an appt with Dr Elie Confer today.

## 2014-06-16 NOTE — Assessment & Plan Note (Signed)
CT chest dfone 06/04/14 shows interval resolution of the 13 mm nodular opacity in the posterior left lower lobe.

## 2014-06-16 NOTE — Assessment & Plan Note (Signed)
Refilled thiamin and folic acid today.

## 2014-06-16 NOTE — Assessment & Plan Note (Signed)
BP Readings from Last 3 Encounters:  06/16/14 194/96  06/02/14 186/90  05/28/14 159/80    Lab Results  Component Value Date   NA 140 05/28/2014   K 3.6 05/28/2014   CREATININE 1.54* 05/28/2014    Assessment: Blood pressure control: severely elevated Progress toward BP goal:  unchanged (Pt not taking medications as prescibed) Comments: He is only taking the Lisinopril and ran out of the amlodipine and metoprolol, but never picked up his new prescriptions from the pharmacy  Plan: Medications:  continue current medications, including lisinopril 40mg  daily, amlodipine 10mg  daily, and Toprol-XL 50mg  daily Other plans: Return to the clinic in 2 weeks for BP recheck.

## 2014-06-16 NOTE — Progress Notes (Signed)
Patient ID: [place], male   DOB: [birth year], 68 y.o.   MRN: [id number]  Subjective:   Patient ID: [place] male   DOB: 1946/10/01 68 y.o.   MRN: [id number]  HPI: [place] is a 69 y.o. M w/ PMH DVT/PE 2012 on coumadin since then (briefly stopped last week), uncontrolled HTN, CAD s/p CABG, and well controlled DM2 who presents for BP f/u.  He was seen by Dr Eula Fried on 6/15 and his BP was elevated at that time 180s/90s. He was not taking his medications as prescribed. This was addressed by Dr. Eula Fried who asked the pt to return to the clinic in 2 weeks for a BP recheck.   He has not been taking his Amlodipine or Toprol- XL as he has not picked them up from the pharmacy. He has only been taking his Lisinopril.   Pt denies fevers, chills, headaches, chest pain, SOB, abd pain, N/V/diarhhea/constipation, changes in urination. +nocturia and occasional peripheral edema.    Past Medical History  Diagnosis Date  . Coronary artery disease   . Diabetes mellitus without complication   . HTN (hypertension)   . Gout   . Obesity   . Alcohol abuse   . Tobacco abuse   . History of pulmonary embolism   . Hypercholesteremia    Current Outpatient Prescriptions  Medication Sig Dispense Refill  . lisinopril (PRINIVIL,ZESTRIL) 40 MG tablet Take 1 tablet (40 mg total) by mouth daily.  30 tablet  2  . thiamine (VITAMIN B-1) 100 MG tablet Take 1 tablet (100 mg total) by mouth daily.  30 tablet  6  . warfarin (COUMADIN) 10 MG tablet Take 1.5 tablets (15 mg) Mon, Tues, Weds, Thurs. Take 1 tablet Fri, Sat, Sun.      . amLODipine (NORVASC) 10 MG tablet Take 1 tablet (10 mg total) by mouth daily.  30 tablet  3  . fluticasone (FLONASE) 50 MCG/ACT nasal spray Place 2 sprays into both nostrils daily.  16 g  3  . folic acid (FOLVITE) 1 MG tablet Take 1 tablet (1 mg total) by mouth daily.  30 tablet  6  . metoprolol succinate (TOPROL-XL) 50 MG 24 hr tablet Take 1 tablet (50 mg total) by mouth daily.   30 tablet  2  . rosuvastatin (CRESTOR) 10 MG tablet Take 1 tablet (10 mg total) by mouth daily.  30 tablet  11   No current facility-administered medications for this visit.   Family History  Problem Relation Age of Onset  . Heart disease Father   . COPD Father   . Diabetes Mellitus II Mother    History   Social History  . Marital Status: Married    Spouse Name: N/A    Number of Children: N/A  . Years of Education: 10   Occupational History  . Retired     used to drive a truck locally   [place]  . Smoking status: Current Some Day Smoker -- 0.25 packs/day for 50 years    Types: Cigarettes  . Smokeless tobacco: None     Comment: trying to quit/ PATIENT STATES HE HAS CUT BACK-SMOKE 3 CIGARETTES A DAY  . Alcohol Use: 0.0 oz/week     Comment: Drinks 1 pint of gin over the weekend but does not drink everyday. Denies hx of blackouts, seizures, tremors.  . Drug Use: No  . Sexual Activity: None   Other Topics Concern  . None   Social History Narrative   Married,  lives with his wife, son and grandchild. He has 7 sons and 2 daughters. Only one son and one daughter live here in [place].    Review of Systems: A 12 point ROS was performed; pertinent positives and negatives were noted in the HPI   Objective:  Physical Exam: Filed Vitals:   06/16/14 1442  BP: 194/96  Pulse: 87  Temp: 97.8 F (36.6 C)  TempSrc: Oral  Height: 5\' 7"  (1.702 m)  Weight: 211 lb 12.8 oz (96.072 kg)  SpO2: 98%   Constitutional: Vital signs reviewed.  Patient is a well-developed and well-nourished male in no acute distress and cooperative with exam.  Head: Normocephalic and atraumatic Cardiovascular: RRR Pulmonary/Chest: Normal respiratory effort, CTAB Abdominal: Soft. Non-tender, non-distended, bowel sounds are normal Musculoskeletal: Moves all 4 extremties Hematology: No cervical adenopathy.  Neurological: A&O x3, cranial nerve II-XII are grossly intact, no focal  deficits Psychiatric: Normal mood and affect. speech and behavior is normal.   Assessment & Plan:   Please refer to Problem List based Assessment and Plan

## 2014-06-16 NOTE — Patient Instructions (Signed)
**  Be sure to pick up all of your medications from the pharmacy today.  **Return to the clinic in 2 weeks for a blood pressure check.    General Instructions:   Please bring your medicines with you each time you come to clinic.  Medicines may include prescription medications, over-the-counter medications, herbal remedies, eye drops, vitamins, or other pills.   Progress Toward Treatment Goals:  Treatment Goal 06/02/2014  Hemoglobin A1C at goal  Blood pressure deteriorated  Stop smoking smoking the same amount    Self Care Goals & Plans:  Self Care Goal 06/16/2014  Manage my medications take my medicines as prescribed; bring my medications to every visit; refill my medications on time  Monitor my health check my feet daily; keep track of my blood glucose; bring my glucose meter and log to each visit  Eat healthy foods eat more vegetables; eat foods that are low in salt; eat baked foods instead of fried foods  Be physically active -  Stop smoking call [facility] (1-800-QUIT-NOW)  Meeting treatment goals -    No flowsheet data found.   Care Management & Community Referrals:  No flowsheet data found.

## 2014-06-17 LAB — POCT INR: INR: 1.3

## 2014-06-17 NOTE — Patient Instructions (Signed)
Patient instructed to take medications as defined in the Anti-coagulation Track section of this encounter.  Patient instructed to take today's dose.  Patient verbalized understanding of these instructions.    

## 2014-06-17 NOTE — Progress Notes (Signed)
Anti-Coagulation Progress Note  [place] is a 68 y.o. male who is currently on an anti-coagulation regimen.    RECENT RESULTS: Recent results are below, the most recent result is correlated with a dose of 70 mg. per week: Lab Results  Component Value Date   INR 1.30 06/16/2014   INR 1.1 06/02/2014   INR 2.5 05/28/2014    ANTI-COAG DOSE: Anticoagulation Dose Instructions as of 06/16/2014     Dorene Grebe Tue Wed Thu Fri Sat   New Dose 10 mg 15 mg 10 mg 15 mg 10 mg 15 mg 10 mg       ANTICOAG SUMMARY: Anticoagulation Episode Summary   Current INR goal 2.0-3.0  Next INR check 06/23/2014  INR from last check 1.30! (06/16/2014)  Most recent INR 1.30! (06/17/2014)  Weekly max dose   Target end date   [facility]  Preferred lab   Send INR reminders to    Indications  History of pulmonary embolus (PE) [V12.55] Long term (current) use of anticoagulants [V58.61]        Comments         ANTICOAG TODAY: Anticoagulation Summary as of 06/16/2014   INR goal 2.0-3.0  Selected INR 1.30! (06/16/2014)  Next INR check 06/23/2014  Target end date    Indications  History of pulmonary embolus (PE) [V12.55] Long term (current) use of anticoagulants [V58.61]      Anticoagulation Episode Summary   [facility]   Preferred lab    Send INR reminders to    Comments       PATIENT INSTRUCTIONS: Patient Instructions  Patient instructed to take medications as defined in the Anti-coagulation Track section of this encounter.  Patient instructed to take today's dose.  Patient verbalized understanding of these instructions.       FOLLOW-UP Return in 7 days (on 06/23/2014) for Follow up INR at [place], III Pharm.D., CACP

## 2014-06-17 NOTE — Progress Notes (Signed)
INTERNAL MEDICINE TEACHING ATTENDING ADDENDUM - Aldine Contes, MD: I reviewed and discussed with the resident Dr. Eulas Post, the patient's medical history, physical examination, diagnosis and results of pertinent tests and treatment and I agree with the patient's care as documented.

## 2014-06-23 ENCOUNTER — Ambulatory Visit (INDEPENDENT_AMBULATORY_CARE_PROVIDER_SITE_OTHER): Payer: Commercial Managed Care - HMO | Admitting: Pharmacist

## 2014-06-23 DIAGNOSIS — Z7901 Long term (current) use of anticoagulants: Secondary | ICD-10-CM | POA: Diagnosis not present

## 2014-06-23 DIAGNOSIS — Z86711 Personal history of pulmonary embolism: Secondary | ICD-10-CM | POA: Diagnosis not present

## 2014-06-23 LAB — POCT INR: INR: 2

## 2014-06-23 NOTE — Patient Instructions (Signed)
Patient instructed to take medications as defined in the Anti-coagulation Track section of this encounter.  Patient instructed to take today's dose.  Patient verbalized understanding of these instructions.    

## 2014-06-23 NOTE — Progress Notes (Signed)
Anti-Coagulation Progress Note  [place] is a 68 y.o. male who is currently on an anti-coagulation regimen.    RECENT RESULTS: Recent results are below, the most recent result is correlated with a dose of 85 mg. per week: Lab Results  Component Value Date   INR 2.00 06/23/2014   INR 1.30 06/16/2014   INR 1.1 06/02/2014    ANTI-COAG DOSE: Anticoagulation Dose Instructions as of 06/23/2014     Dorene Grebe Tue Wed Thu Fri Sat   New Dose 10 mg 15 mg 15 mg 15 mg 10 mg 15 mg 10 mg       ANTICOAG SUMMARY: Anticoagulation Episode Summary   Current INR goal 2.0-3.0  Next INR check 06/30/2014  INR from last check 2.00 (06/23/2014)  Weekly max dose   Target end date   [facility]  Preferred lab   Send INR reminders to    Indications  History of pulmonary embolus (PE) [V12.55] Long term (current) use of anticoagulants [V58.61]        Comments         ANTICOAG TODAY: Anticoagulation Summary as of 06/23/2014   INR goal 2.0-3.0  Selected INR 2.00 (06/23/2014)  Next INR check 06/30/2014  Target end date    Indications  History of pulmonary embolus (PE) [V12.55] Long term (current) use of anticoagulants [V58.61]      Anticoagulation Episode Summary   [facility]   Preferred lab    Send INR reminders to    Comments       PATIENT INSTRUCTIONS: Patient Instructions  Patient instructed to take medications as defined in the Anti-coagulation Track section of this encounter.  Patient instructed to take today's dose.  Patient verbalized understanding of these instructions.       FOLLOW-UP Return in 7 days (on 06/30/2014) for Follow up INR at [place], III Pharm.D., CACP

## 2014-06-30 ENCOUNTER — Ambulatory Visit: Payer: Commercial Managed Care - HMO | Admitting: Internal Medicine

## 2014-06-30 ENCOUNTER — Ambulatory Visit: Payer: Commercial Managed Care - HMO

## 2014-07-07 ENCOUNTER — Ambulatory Visit: Payer: Commercial Managed Care - HMO

## 2014-07-15 ENCOUNTER — Ambulatory Visit: Payer: Commercial Managed Care - HMO | Admitting: Internal Medicine

## 2014-08-11 ENCOUNTER — Telehealth: Payer: Self-pay | Admitting: Pharmacist

## 2014-08-11 NOTE — Telephone Encounter (Signed)
[place] is a 68 y.o. male who was reviewed by clinical pharmacy for hypertension medication adherence.   Hypertension Regimen and Fill History Pharmacy contacted: [id number] Medication and Dose Last Filled (days supply)  Amlodipine 10 mg daily 06/16/14 (30)  Metoprolol 50 mg daily 06/16/14 (30)  Lisinopril 40 mg daily 06/16/14 (30)   Pertinent Labs/Vitals     Component Value Date/Time   NA 140 05/28/2014 1046   K 3.6 05/28/2014 1046   CL 101 05/28/2014 1046   CO2 29 05/28/2014 1046   GLUCOSE 185* 05/28/2014 1046   BUN 18 05/28/2014 1046   CREATININE 1.54* 05/28/2014 1046   CREATININE 1.00 11/11/2012 1315   CALCIUM 8.7 05/28/2014 1046   GFRNONAA 46* 05/28/2014 1046   GFRNONAA 76* 11/11/2012 1315   GFRAA 53* 05/28/2014 1046   GFRAA 89* 11/11/2012 1315   BP Readings from Last 3 Encounters:  06/16/14 194/96  06/02/14 186/90  05/28/14 159/80    A/P:  Thank you for including me in [place] care.   Patient reports previously having trouble affording BP medications, but is no longer having cost issues.  Provided education on the importance of medication adherence.  Advised patient to contact me if further questions/concerns regarding medications arise.

## 2014-09-22 ENCOUNTER — Encounter: Payer: Self-pay | Admitting: *Deleted

## 2014-10-17 ENCOUNTER — Ambulatory Visit (INDEPENDENT_AMBULATORY_CARE_PROVIDER_SITE_OTHER): Payer: Commercial Managed Care - HMO | Admitting: Internal Medicine

## 2014-10-17 ENCOUNTER — Encounter: Payer: Self-pay | Admitting: Internal Medicine

## 2014-10-17 VITALS — BP 166/104 | HR 87 | Temp 97.8°F | Wt 213.5 lb

## 2014-10-17 DIAGNOSIS — N521 Erectile dysfunction due to diseases classified elsewhere: Secondary | ICD-10-CM

## 2014-10-17 DIAGNOSIS — E785 Hyperlipidemia, unspecified: Secondary | ICD-10-CM

## 2014-10-17 DIAGNOSIS — I129 Hypertensive chronic kidney disease with stage 1 through stage 4 chronic kidney disease, or unspecified chronic kidney disease: Secondary | ICD-10-CM

## 2014-10-17 DIAGNOSIS — I8290 Acute embolism and thrombosis of unspecified vein: Secondary | ICD-10-CM

## 2014-10-17 DIAGNOSIS — I2510 Atherosclerotic heart disease of native coronary artery without angina pectoris: Secondary | ICD-10-CM

## 2014-10-17 DIAGNOSIS — E669 Obesity, unspecified: Secondary | ICD-10-CM

## 2014-10-17 DIAGNOSIS — I739 Peripheral vascular disease, unspecified: Secondary | ICD-10-CM

## 2014-10-17 DIAGNOSIS — Z720 Tobacco use: Secondary | ICD-10-CM

## 2014-10-17 DIAGNOSIS — E1122 Type 2 diabetes mellitus with diabetic chronic kidney disease: Secondary | ICD-10-CM

## 2014-10-17 DIAGNOSIS — N183 Chronic kidney disease, stage 3 unspecified: Secondary | ICD-10-CM

## 2014-10-17 DIAGNOSIS — I10 Essential (primary) hypertension: Secondary | ICD-10-CM

## 2014-10-17 DIAGNOSIS — Z23 Encounter for immunization: Secondary | ICD-10-CM

## 2014-10-17 DIAGNOSIS — N400 Enlarged prostate without lower urinary tract symptoms: Secondary | ICD-10-CM

## 2014-10-17 DIAGNOSIS — E1169 Type 2 diabetes mellitus with other specified complication: Secondary | ICD-10-CM

## 2014-10-17 LAB — POCT GLYCOSYLATED HEMOGLOBIN (HGB A1C): Hemoglobin A1C: 5.9

## 2014-10-17 LAB — GLUCOSE, CAPILLARY: Glucose-Capillary: 122 mg/dL — ABNORMAL HIGH (ref 70–99)

## 2014-10-17 MED ORDER — TERAZOSIN HCL 2 MG PO CAPS: 2.0000 mg | ORAL_CAPSULE | Freq: Every day | ORAL | Status: DC

## 2014-10-17 MED ORDER — NICOTINE 21 MG/24HR TD PT24: 21.0000 mg | MEDICATED_PATCH | TRANSDERMAL | Status: DC

## 2014-10-17 MED ORDER — METOPROLOL SUCCINATE ER 100 MG PO TB24: 100.0000 mg | ORAL_TABLET | Freq: Every day | ORAL | Status: DC

## 2014-10-17 MED ORDER — SILDENAFIL CITRATE 50 MG PO TABS: 50.0000 mg | ORAL_TABLET | ORAL | Status: DC | PRN

## 2014-10-17 MED ORDER — NICOTINE 14 MG/24HR TD PT24: 14.0000 mg | MEDICATED_PATCH | Freq: Every day | TRANSDERMAL | Status: DC

## 2014-10-17 MED ORDER — ROSUVASTATIN CALCIUM 10 MG PO TABS: 10.0000 mg | ORAL_TABLET | Freq: Every day | ORAL | Status: DC

## 2014-10-17 MED ORDER — NICOTINE 7 MG/24HR TD PT24: 7.0000 mg | MEDICATED_PATCH | Freq: Every day | TRANSDERMAL | Status: DC

## 2014-10-17 NOTE — Assessment & Plan Note (Signed)
His claudication is bilateral but mild at this time. We will continue to work on cardiovascular risk factor modification including better control of his hypertension as well as smoking cessation. We will reassess his claudication symptoms at the follow-up visit.

## 2014-10-17 NOTE — Assessment & Plan Note (Signed)
Smoking cessation is critical to his long-term health given his underlying coronary artery disease and peripheral vascular occlusive disease, especially in light of his 56% risk for a cardiovascular event in the next 10 years. We had a long discussion on the effects of tobacco use on his health. He is ready to give smoking cessation another go and he was prescribed nicotine patches at 21 mg per 24 hours, 14 mg per 24 hours, and 7 mg per 24 hours to be used daily at 2 weeks each for each level. We will reassess his progress toward smoking cessation at the follow-up visit.

## 2014-10-17 NOTE — Assessment & Plan Note (Signed)
He made note of nocturia  5. This is associated with occasional dribbling and a sensation of incomplete bladder emptying. We discussed the possible causes including benign prosthetic hypertrophy or prostate cancer. I recommended a rectal examination and PSA to further assess for possible prostate cancer. He was not interested and deferred both assessments. He was interested in receiving pharmacotherapy for possible benign prostatic hypertrophy and Terazosin 2 mg by mouth at night was begun. We will reassess the success of the this therapy at the follow-up visit and make adjustments as necessary.

## 2014-10-17 NOTE — Assessment & Plan Note (Signed)
He received a flu vaccination today. At the next visit we will offer him the pneumococcal 13 vaccine. He is otherwise up-to-date on his health care maintenance.

## 2014-10-17 NOTE — Assessment & Plan Note (Signed)
His blood pressure is elevated today at 166/104. This is while taking amlodipine 10 mg by mouth daily, lisinopril 40 mg by mouth daily, and metoprolol XL 50 mg by mouth daily. We decided to increase the metoprolol XL to 100 mg by mouth daily. In addition, he was started on Terazosin 2 mg by mouth each night for his BPH. This should also help with his hypertension. We will reassess his hypertension control at the follow-up visit with these changes.

## 2014-10-17 NOTE — Assessment & Plan Note (Signed)
He is interested in exercising more in an attempt to lose weight. He was given information about the [employer] program and the fact that it is covered by his insurance. He was encouraged to join the [facility] gym via the [employer] program. We will reassess his weight at the follow-up visit. That being said, exercise should help with his coronary artery disease, peripheral vascular occlusive disease, diabetes, hypertension and risk for recurrent deep venous thromboembolism.

## 2014-10-17 NOTE — Assessment & Plan Note (Signed)
He denies any angina at this time while on aspirin 81 mg daily, Toprol XL 50 mg daily, and amlodipine 10 mg by mouth daily. He is also on lisinopril 40 mg by mouth daily. With the exception of increasing the Toprol-XL to 100 mg by mouth daily the rest of this regimen will be continued. We are working on cardiovascular risk factors including hypertension and tobacco abuse.

## 2014-10-17 NOTE — Assessment & Plan Note (Signed)
His diabetes is well controlled on diet and exercise alone. His hemoglobin A1c today was 5.9. He was praised for his excellent work on controlling his diabetes. He is also up-to-date on his diabetic health care maintenance.

## 2014-10-17 NOTE — Assessment & Plan Note (Signed)
He has decided to continue on warfarin anticoagulation for his history of venous thromboembolism. He is followed by Dr. Elie Confer in the anticoagulation clinic. We will therefore continue the warfarin therapy under the management of Dr. Elie Confer. Periodically we will rediscuss the risks and benefits of continuing with oral anticoagulation.

## 2014-10-17 NOTE — Assessment & Plan Note (Signed)
His 10 year risk for a cardiovascular event using the ACC/AHA population risk calculator is 56%. With underlying coronary artery disease and peripheral vascular occlusive disease he clearly requires high intensity statin therapy. We will therefore continue the rosuvastatin 10 mg by mouth daily.

## 2014-10-17 NOTE — Assessment & Plan Note (Signed)
He was interested in restarting Viagra 50 mg by mouth as needed. He states this was effective in the past. A prescription for 12 tablets with 11 refills was sent to his preferred pharmacy. We will reassess the success of this medication and its dose at the follow-up visit.

## 2014-10-17 NOTE — Progress Notes (Signed)
   Subjective:    Patient ID: Kevin Castillo, male    DOB: Nov 06, 1946, 68 y.o.   MRN: [id number]  HPI  Please see the A&P for the status of the pt's chronic medical problems.  Review of Systems  Constitutional: Positive for fatigue. Negative for activity change, appetite change and unexpected weight change.  Respiratory: Positive for cough. Negative for chest tightness, shortness of breath and wheezing.   Cardiovascular: Negative for chest pain, palpitations and leg swelling.  Gastrointestinal: Negative for nausea, vomiting, abdominal pain, diarrhea, constipation and abdominal distention.  Genitourinary: Positive for urgency, frequency and difficulty urinating.  Musculoskeletal: Negative for arthralgias, back pain, gait problem, joint swelling, myalgias, neck pain and neck stiffness.  Skin: Negative for color change, rash and wound.  Allergic/Immunologic: Positive for environmental allergies.  Neurological: Negative for dizziness, seizures, syncope, weakness and light-headedness.  Psychiatric/Behavioral: Positive for dysphoric mood. The patient is not nervous/anxious.       Objective:   Physical Exam  Nursing note and vitals reviewed. Constitutional: He is oriented to person, place, and time. He appears well-developed and well-nourished. No distress.  HENT:  Head: Normocephalic and atraumatic.  Eyes: Conjunctivae are normal. Right eye exhibits no discharge. Left eye exhibits no discharge. No scleral icterus.  Cardiovascular: Normal rate, regular rhythm and normal heart sounds.  Exam reveals no gallop and no friction rub.   No murmur heard. Pulmonary/Chest: Effort normal and breath sounds normal. No respiratory distress. He has no wheezes. He has no rales.  Abdominal: Soft. Bowel sounds are normal. He exhibits no distension. There is no tenderness. There is no rebound and no guarding.  Musculoskeletal: Normal range of motion. He exhibits no edema and no tenderness.  Neurological: He is  alert and oriented to person, place, and time. He exhibits normal muscle tone.  Skin: Skin is warm and dry. No rash noted. He is not diaphoretic. No erythema.  Psychiatric: He has a normal mood and affect. His behavior is normal. Judgment and thought content normal.      Assessment & Plan:   Please see problem oriented charting.

## 2014-10-17 NOTE — Assessment & Plan Note (Deleted)
Unclear cause as of yet

## 2014-10-17 NOTE — Assessment & Plan Note (Signed)
He has stage III chronic kidney disease likely related to both diabetes and hypertension. His diabetes is well-controlled but his hypertension remains poorly controlled. He is on full dose ace inhibition for his diabetes, hypertension, and chronic kidney disease. Will make further adjustments in his antihypertensive regimen with a goal of lowering the blood pressure to less than 140/80. At the follow-up visit we will reassess the kidney function with a basic metabolic panel.

## 2014-10-17 NOTE — Patient Instructions (Signed)
It was nice to meet you.  I look forward to taking care of you for years to come.  1) Increase your metoprolol XL (Toprol-XL) to 100 mg (2 tablets) daily until you run out.  The new prescription will be 100 mg tablets and you will only need to take one of those.  2) Stop taking the thiamine, folic acid, and multivitamin.  As long as you are eating vegetables you do not need them.  3) I prescribed nicotine patches for you.  Start with 21 mg patch daily for 2 weeks, then go to 14 mg patches daily for 2 weeks, then go to 7 mg patches daily for 2 weeks.  I am proud of you for wanting to quit smoking.  4) We gave you information on the silver sneakers program through your insurance to use the gym at the [facility].  5) You can use any over the counter cough syrup you would like for a few days until you get over the cough.  6) I wrote a prescription for Viagra 50 mg as needed before intercourse.  I provided 11 refills.  7) I started terazosin 2 mg each night to help shrink your prostate and hopefully decrease how many times a night you are getting up.  8) We gave you the flu shot today.  I will see you in 2 months, sooner if necessary.

## 2015-02-23 ENCOUNTER — Encounter: Payer: Medicare HMO | Admitting: Internal Medicine

## 2015-02-23 ENCOUNTER — Encounter: Payer: Self-pay | Admitting: Internal Medicine

## 2015-03-09 ENCOUNTER — Ambulatory Visit (INDEPENDENT_AMBULATORY_CARE_PROVIDER_SITE_OTHER): Payer: Commercial Managed Care - HMO | Admitting: Pharmacist

## 2015-03-09 DIAGNOSIS — I8290 Acute embolism and thrombosis of unspecified vein: Secondary | ICD-10-CM | POA: Diagnosis not present

## 2015-03-09 DIAGNOSIS — Z7901 Long term (current) use of anticoagulants: Secondary | ICD-10-CM | POA: Diagnosis not present

## 2015-03-09 LAB — POCT INR: INR: 1

## 2015-03-09 MED ORDER — RIVAROXABAN 20 MG PO TABS: 20.0000 mg | ORAL_TABLET | Freq: Every day | ORAL | Status: DC

## 2015-03-09 NOTE — Progress Notes (Signed)
CLINICAL PHARMACY ANTICOAGULATION TRANSITION NOTE Kevin Castillo is a 69 y.o. male who is currently on an anti-coagulation regimen. He has a poor history of INRs within TTR and clinic adherence.  SAMe score-TT2R2 6; TTR (time in target range) < 60%;  HAS-BLED 2.  RECENT RESULTS: Lab Results  Component Value Date   INR 1.0 03/09/2015   INR 2.00 06/23/2014   INR 1.30 06/16/2014   ANTI-COAG DOSE: Anticoagulation Dose Instructions as of 03/09/2015      Dorene Grebe Tue Wed Thu Fri Sat   New Dose 10 mg 15 mg 15 mg 15 mg 10 mg 15 mg 10 mg      ANTICOAG SUMMARY:  ANTICOAG TODAY: Anticoagulation Summary as of 03/09/2015    INR goal 2.0-3.0  Selected INR 1.0! (03/09/2015)  Next INR check   Target end date    Indications  Venous thromboembolism [I82.90] Long term current use of anticoagulant therapy [Z79.01]      Anticoagulation Episode Summary    [facility] check location [facility]   Preferred lab    Discontinue date 03/09/2015   Discontinue reason Alternate Therapy Initiated   Send INR reminders to    Comments       ASSESSMENT Indication(s): DVT Duration: indefinite  Labs:    Component Value Date/Time   AST 20 11/11/2013 1537   ALT 13 11/11/2013 1537   NA 140 05/28/2014 1046   K 3.6 05/28/2014 1046   CL 101 05/28/2014 1046   CO2 29 05/28/2014 1046   GLUCOSE 185* 05/28/2014 1046   HGBA1C 5.9 10/17/2014 1141   HGBA1C * 02/11/2011 0520    7.6 (NOTE)                                                                       According to the ADA Clinical Practice Recommendations for 2011, when HbA1c is used as a screening test:   >=6.5%   Diagnostic of Diabetes Mellitus           (if abnormal result  is confirmed)  5.7-6.4%   Increased risk of developing Diabetes Mellitus  References:Diagnosis and Classification of Diabetes Mellitus,Diabetes [id number] 1):S62-S69 and Standards of Medical Care in         Diabetes - 2011,Diabetes Care,2011,34  (Suppl 1):S11-S61.   BUN 18  05/28/2014 1046   CREATININE 1.54* 05/28/2014 1046   CREATININE 1.00 11/11/2012 1315   CALCIUM 8.7 05/28/2014 1046   GFRNONAA 46* 05/28/2014 1046   GFRNONAA 76* 11/11/2012 1315   GFRAA 53* 05/28/2014 1046   GFRAA 89* 11/11/2012 1315   WBC 5.7 12/30/2013 1532   HGB 10.1* 12/30/2013 1532   HCT 30.8* 12/30/2013 1532   PLT 232 12/30/2013 1532    The following has been considered to transition the patient to a direct oral anticoagulant:   [x]  Contraindications to direct oral anticoagulants [x]  Renal dysfunction for dose adjustments [x]  Hepatic dysfunction for dose adjustments [x]  Drug-drug interactions [x]  Drug-disease interactions [x]  Financial barriers  Adherence: Patient has no known adherence challenges.   Safety: Patient reports no recent signs or symptoms of bleeding. Patient reports no signs of symptoms of thrombosis.      Recommendations:   Warfarin will be discontinued today, as INR is < 3.  Transition to new oral anticoagulant rivaroxaban (Xarelto) 20mg  PO daily with evening meal (supper). .  Monitoring: 3 months, CMP   PLAN Implement changes as recommended above with advice and consent of the patient. An initial discussion with patient was held by Dr. Flossie Dibble and Dr. Hassie Bruce. I discussed the following issues regarding transitioning to a DOAC off of warfarin:  Patient must (no different than if on warfarin) report to the clinic any signs or symptoms of bleeding (blood in urine, stool; throwing up blood, coughing up blood, nose-bleeds, increased bruising, etc.) to the IM [facility] or report to the ED in the event of gross bleeding that cannot be managed by application of pressure to the affected area. He was instructed similarly that in the event of calf-pain, redness of his leg, shortness of breath, fever, chest-pain etc. (no different than if he were on warfarin) to call the IM [facility] or report to the ED for evaluation.  A discussion indicating the healthcare system's  P&T Committee and Medical Executive Board approved management strategy in the event of life-threatening bleeding was conducted with the patient.  He verbalized understanding of these instructions. Patient has recieved a savings card.     Follow-up In 3 months.   Caryl Bis Clinical Pharmacist 03/09/2015, 8:35 PM

## 2015-03-09 NOTE — Patient Instructions (Signed)
Patient instructed to take medications as defined in the Anti-coagulation Track section of this encounter.  Patient instructed to DISCONTINUE warfarin/Coumadin. Patient instructed to begin rivaroxaban/Xarelto 20mg , take 1 tablet by mouth daily with supper (take with food).   Patient verbalized understanding of these instructions.

## 2015-03-10 NOTE — Progress Notes (Signed)
Indication: Venous thromboembolism.  Duration: Pt. has chosen to continue anticoagulation.  INR below target.  Patient is being switched to Xarelto for chronic anticoagulation.  Agree with Dr. Gladstone Pih assessment and plan as documented.

## 2015-03-16 ENCOUNTER — Telehealth: Payer: Self-pay | Admitting: Licensed Clinical Social Worker

## 2015-03-16 NOTE — Telephone Encounter (Signed)
Kevin Castillo was referred to CSW as pt has been switched to Xarelto and copayment will be over $40 with insurance.  CSW placed call to Kevin Castillo, pt unaware of the ExtraHelp program.  CSW provided Kevin Castillo with the phone number to call to inquire if he may qualify.  CSW requested Kevin Castillo to return call to CSW if he does not qualify for ExtraHelp.  CSW will mail Xarelto PAP application out if pt does not qualify for ExtraHelp.

## 2015-03-24 NOTE — Telephone Encounter (Signed)
CSW placed follow up call to [name] regarding the [employer].  Pt states he placed call to [place], provided county of residence and was placed on hold.  [name] states he did not have the opportunity to speak with anyone over the phone or to complete an application.  CSW inquired if [name] wanted CSW to mail the Xarelto PAP application, pt in agreement.  Letter mailed.

## 2015-04-07 DIAGNOSIS — E1140 Type 2 diabetes mellitus with diabetic neuropathy, unspecified: Secondary | ICD-10-CM | POA: Diagnosis not present

## 2015-04-07 DIAGNOSIS — I10 Essential (primary) hypertension: Secondary | ICD-10-CM | POA: Diagnosis not present

## 2015-04-07 DIAGNOSIS — E784 Other hyperlipidemia: Secondary | ICD-10-CM | POA: Diagnosis not present

## 2015-04-07 DIAGNOSIS — I8291 Chronic embolism and thrombosis of unspecified vein: Secondary | ICD-10-CM | POA: Diagnosis not present

## 2015-04-07 DIAGNOSIS — I2510 Atherosclerotic heart disease of native coronary artery without angina pectoris: Secondary | ICD-10-CM | POA: Diagnosis not present

## 2015-04-10 ENCOUNTER — Encounter: Payer: Self-pay | Admitting: Internal Medicine

## 2015-04-10 ENCOUNTER — Ambulatory Visit (INDEPENDENT_AMBULATORY_CARE_PROVIDER_SITE_OTHER): Payer: Commercial Managed Care - HMO | Admitting: Internal Medicine

## 2015-04-10 VITALS — BP 154/60 | HR 88 | Temp 97.9°F | Wt 216.3 lb

## 2015-04-10 DIAGNOSIS — I10 Essential (primary) hypertension: Secondary | ICD-10-CM

## 2015-04-10 DIAGNOSIS — N183 Chronic kidney disease, stage 3 unspecified: Secondary | ICD-10-CM

## 2015-04-10 DIAGNOSIS — Z0000 Encounter for general adult medical examination without abnormal findings: Secondary | ICD-10-CM

## 2015-04-10 DIAGNOSIS — I25810 Atherosclerosis of coronary artery bypass graft(s) without angina pectoris: Secondary | ICD-10-CM

## 2015-04-10 DIAGNOSIS — E1122 Type 2 diabetes mellitus with diabetic chronic kidney disease: Secondary | ICD-10-CM

## 2015-04-10 DIAGNOSIS — J302 Other seasonal allergic rhinitis: Secondary | ICD-10-CM | POA: Diagnosis not present

## 2015-04-10 DIAGNOSIS — Z23 Encounter for immunization: Secondary | ICD-10-CM | POA: Diagnosis not present

## 2015-04-10 DIAGNOSIS — N521 Erectile dysfunction due to diseases classified elsewhere: Secondary | ICD-10-CM

## 2015-04-10 DIAGNOSIS — N400 Enlarged prostate without lower urinary tract symptoms: Secondary | ICD-10-CM

## 2015-04-10 DIAGNOSIS — I8290 Acute embolism and thrombosis of unspecified vein: Secondary | ICD-10-CM

## 2015-04-10 DIAGNOSIS — Z720 Tobacco use: Secondary | ICD-10-CM

## 2015-04-10 DIAGNOSIS — E1169 Type 2 diabetes mellitus with other specified complication: Secondary | ICD-10-CM

## 2015-04-10 DIAGNOSIS — E785 Hyperlipidemia, unspecified: Secondary | ICD-10-CM

## 2015-04-10 DIAGNOSIS — F1010 Alcohol abuse, uncomplicated: Secondary | ICD-10-CM

## 2015-04-10 LAB — POCT GLYCOSYLATED HEMOGLOBIN (HGB A1C): Hemoglobin A1C: 6.1

## 2015-04-10 LAB — GLUCOSE, CAPILLARY: GLUCOSE-CAPILLARY: 159 mg/dL — AB (ref 70–99)

## 2015-04-10 MED ORDER — TERAZOSIN HCL 2 MG PO CAPS: 4.0000 mg | ORAL_CAPSULE | Freq: Every day | ORAL | Status: DC

## 2015-04-10 MED ORDER — CETIRIZINE HCL 1 MG/ML PO SYRP: 5.0000 mg | ORAL_SOLUTION | Freq: Every day | ORAL | Status: DC

## 2015-04-10 NOTE — Patient Instructions (Signed)
It was great to see you again.  You are doing a very nice job with your diabetes.  1)  I started zyrtec cough syrup for your cough related to allergies.  Take 5 mg (5 ml) daily for at least one month to see if it helps.  If it is too expensive go back to the Flonase.  2)  Please increase the terazosin to 4 mg every night for urination and blood pressure.  Remember to stand up slowly.  3) I sent referrals to the eye and foot doctors.  4) I gave you the pneumonia shot today.  5) Keep taking all of your other medications as you are.  I will see you back in 3 months, sooner if necessary.

## 2015-04-10 NOTE — Assessment & Plan Note (Signed)
He was unable to afford the Viagra for his erectile dysfunction secondary to his diabetes, hypertension, and smoking. We continue to control the diabetes well with diet. We are making changes in his blood pressure regimen in order to gain better control of the hypertension. Smoking cessation was discussed and encouraged as outlined below. We will not prescribe any medication for him at this time given the costs, but pay attention to Viagra costs now that it is no longer on patent.

## 2015-04-10 NOTE — Assessment & Plan Note (Signed)
He is tolerating the rosuvastatin 10 mg by mouth daily without myalgias or other side effects. This will be continued. Per the ACC/AHA guidelines a lipid panel was not indicated.

## 2015-04-10 NOTE — Assessment & Plan Note (Signed)
Since starting the Terazosin he notes that his nocturia has decreased from 5 times per night to 2 times per night. He notes no orthostatic symptoms as of yet on the 2 mg dose. Given the need to escalate his antihypertensive regimen we increased the dose of Terazosin to 4 mg by mouth at night. This may further decrease his nocturia and will be reassessed at the follow-up visit.

## 2015-04-10 NOTE — Assessment & Plan Note (Signed)
He currently is unable to afford the Zostavax at this time. He is also uninterested and colonoscopic screening given some adverse effects friends of his have experienced. He was willing to have the Prevnar 13 vaccination today and this was provided. He is otherwise up-to-date on his general preventative health maintenance.

## 2015-04-10 NOTE — Assessment & Plan Note (Signed)
He has had a one-week history of an occasional productive cough associated with rhinorrhea, a sensation of a postnasal drip, and no new medications. Smoking cessation was encouraged as noted above.  As this is an acute cough, the thought that this is secondary to lisinopril his not yet reasonable. Given his symptoms, it is likely related to an allergic rhinitis. In the past this was treated with Flonase with some success, although he has stopped taking it. He prefers a pill and we started Zyrtec 10 mg by mouth daily as needed for an allergic rhinitis. If this is ineffective he was asked to restart the Flonase which he has at home. We will reassess the efficacy of the antihistamine in controlling his postnasal drip and upper airway cough syndrome at the follow-up visit.

## 2015-04-10 NOTE — Assessment & Plan Note (Signed)
He has had no chest pain since the last clinic visit and therefore has not needed to use any sublingual nitroglycerin. He is tolerating his antihypertensive, anti-lipid, and antiplatelet therapy well and these will be continued. Most importantly, we will work on aggressive risk factor modification as outlined above.

## 2015-04-10 NOTE — Assessment & Plan Note (Signed)
His blood pressure was above target today at 154/60. He normally does not take his antihypertensive medications until noon. Given he has inadequate control of his blood pressure on amlodipine 10 mg by mouth daily, lisinopril 40 mg by mouth daily, metoprolol XL 100 mg by mouth daily, and Terazosin 2 mg by mouth nightly we will increase the Terazosin to 4 mg by mouth nightly and reassess his blood pressure control at the follow-up visit with the target being less than 140/90 given his underlying diabetes, associated stage III chronic kidney disease, and coronary artery disease.

## 2015-04-10 NOTE — Progress Notes (Signed)
   Subjective:    Patient ID: Kevin Castillo, male    DOB: 08-May-1946, 69 y.o.   MRN: [id number]  HPI  Kevin Castillo is here for follow-up of his diabetes and hypertension. Please see the A&P for the status of the pt's chronic medical problems.  Review of Systems  Constitutional: Negative for fever, activity change, appetite change and unexpected weight change.  HENT: Positive for congestion, postnasal drip and rhinorrhea.   Respiratory: Positive for cough and shortness of breath. Negative for chest tightness.   Cardiovascular: Negative for chest pain, palpitations and leg swelling.  Gastrointestinal: Negative for nausea, vomiting, diarrhea and constipation.  Genitourinary:       Nocturia X 2 (down from 5X/night)  Musculoskeletal: Negative for myalgias, back pain, joint swelling and arthralgias.  Skin: Negative for color change, rash and wound.  Neurological: Negative for syncope, weakness and light-headedness.      Objective:   Physical Exam  Constitutional: He is oriented to person, place, and time. He appears well-developed and well-nourished. No distress.  HENT:  Head: Normocephalic and atraumatic.  Cardiovascular: Normal rate, regular rhythm and normal heart sounds.  Exam reveals no gallop and no friction rub.   No murmur heard. Pulmonary/Chest: Effort normal. No respiratory distress. He has no rales.  Right basilar end-inspiratory honk  Abdominal: Soft. Bowel sounds are normal. He exhibits no distension. There is no tenderness. There is no rebound and no guarding.  Musculoskeletal: Normal range of motion. He exhibits no edema or tenderness.  Neurological: He is alert and oriented to person, place, and time. He exhibits normal muscle tone.  Skin: Skin is warm and dry. No rash noted. He is not diaphoretic. No erythema.  Psychiatric: He has a normal mood and affect. His behavior is normal. Judgment and thought content normal.  Nursing note and vitals reviewed.     Assessment  & Plan:   Please see problem oriented charting.

## 2015-04-10 NOTE — Assessment & Plan Note (Signed)
His stage 3 chronic kidney disease is felt to be secondary to both diabetes and hypertension. His diabetes is well controlled and we are making adjustments in his antihypertensive regimen as noted above. We will continue aggressive risk factor modification including management of his diabetes, hypertension, and hyperlipidemia with a high intensity statin which he is also tolerating well.

## 2015-04-10 NOTE — Assessment & Plan Note (Signed)
He has elected to continue with anticoagulation and was recently switched from warfarin to Xarelto secondary to the inability to achieve therapeutic levels and his INR on warfarin. He is tolerating the Xarelto well and has no overt bleeding. We will therefore continue the Xarelto at 20 mg by mouth daily and reassess at the follow-up visit.

## 2015-04-10 NOTE — Assessment & Plan Note (Signed)
He was unable to afford the nicotine patches and never picked them up. That being said, he remains motivated to quit and is now down to 3 cigarettes per day on average. He admits he has troubles when his son, who is a smoker, visits and smokes in front of him. He is not interested in pharmacologic assistance at this time and remains motivated to quit. He will continue to titrate off his cigarettes. We will reassess his success at achieving smoking cessation at the follow-up visit.

## 2015-04-10 NOTE — Assessment & Plan Note (Signed)
His diabetes remains well controlled today with a hemoglobin A1c of 6.1. His is on no medications and on diet alone. He was praised for his continued excellent control of his diabetes. A diabetic foot exam was done today and a referral was made to podiatry for toenail care. A referral was also made to ophthalmology for a diabetic retinal examination as he is due. A microalbumin was not obtained for his stage III chronic kidney disease related to the diabetes as he is on lisinopril at full dose. He was encouraged to continue with his dietary and activity levels given the excellent control of his diabetes with these measures. We will reassess the control of his diabetes at the follow-up visit with a repeat hemoglobin A1c.

## 2015-04-10 NOTE — Assessment & Plan Note (Signed)
He states he continues to decrease the amount of alcohol that he uses and is now down to an occasional gin on the weekends. He states he has not had any alcohol for at least 2 weeks. He was encouraged to continue decreasing his alcohol intake. This will be reassessed at the follow-up visit.

## 2015-04-23 NOTE — Telephone Encounter (Signed)
04/23/15 - CSW received Mr. Escareno portion of the Xarelto application.  Physician portion forwarded to PCP for completion.

## 2015-05-11 ENCOUNTER — Ambulatory Visit (INDEPENDENT_AMBULATORY_CARE_PROVIDER_SITE_OTHER): Payer: Commercial Managed Care - HMO | Admitting: Pharmacist

## 2015-05-11 DIAGNOSIS — Z7901 Long term (current) use of anticoagulants: Secondary | ICD-10-CM | POA: Diagnosis not present

## 2015-05-11 DIAGNOSIS — Z86718 Personal history of other venous thrombosis and embolism: Secondary | ICD-10-CM

## 2015-05-11 NOTE — Progress Notes (Signed)
Kevin Castillo is a 69 y.o. male who reports to the clinic for monitoring of rivaroxaban (Xarelto) therapy.    ASSESSMENT Indication(s): history of RLE DVT Duration: indefinite Date switched from warfarin: 03/09/15  Labs:    Component Value Date/Time   AST 20 11/11/2013 1537   ALT 13 11/11/2013 1537   NA 140 05/28/2014 1046   K 3.6 05/28/2014 1046   CL 101 05/28/2014 1046   CO2 29 05/28/2014 1046   GLUCOSE 185* 05/28/2014 1046   BUN 18 05/28/2014 1046   CREATININE 1.54* 05/28/2014 1046   CREATININE 1.00 11/11/2012 1315   CALCIUM 8.7 05/28/2014 1046   GFRAA 53* 05/28/2014 1046   GFRAA 89* 11/11/2012 1315   WBC 5.7 12/30/2013 1532   HGB 10.1* 12/30/2013 1532   HCT 30.8* 12/30/2013 1532   PLT 232 12/30/2013 1532    rivaroxaban (Xarelto) Dose: 20 mg daily  Adherence: Patient has no known adherence challenges. Patient reports concern about ability to afford copay. Collaborating with Education officer, museum for [place] assistance.  Safety: Patient reports no recent signs or symptoms of bleeding, no signs of symptoms of thrombosis. Medication changes: no.  RECOMMENDATIONS  Medication: no changes recommended at this time  Monitoring: as indicated by PCP. After transition from warfarin, European Society of Cardiology [facility]) Guidelines suggest initial 1 month follow up to evaluate adherence, thromboembolic or bleeding events, other side effects, co-medications, and potential need for blood sampling (hemoglobin, renal/hepatic function). Thereafter, consideration of these same factors should be made 3 months, then 6 months after transition if possible according to ESC (no American guidelines currently available).  [place] Pharmacist 05/11/2015, 10:55 AM   10 minutes was spent face-to-face with patient 50% of time was spent on education, 50% of time was spent on assessment.

## 2015-05-11 NOTE — Patient Instructions (Signed)
Patient educated about medication as defined in this encounter and verbalized understanding by repeating back instructions provided.   

## 2015-05-14 DIAGNOSIS — L603 Nail dystrophy: Secondary | ICD-10-CM | POA: Diagnosis not present

## 2015-05-14 DIAGNOSIS — I739 Peripheral vascular disease, unspecified: Secondary | ICD-10-CM | POA: Diagnosis not present

## 2015-05-14 DIAGNOSIS — E1151 Type 2 diabetes mellitus with diabetic peripheral angiopathy without gangrene: Secondary | ICD-10-CM | POA: Diagnosis not present

## 2015-06-03 DIAGNOSIS — H43812 Vitreous degeneration, left eye: Secondary | ICD-10-CM | POA: Diagnosis not present

## 2015-06-03 DIAGNOSIS — Z961 Presence of intraocular lens: Secondary | ICD-10-CM | POA: Diagnosis not present

## 2015-06-03 DIAGNOSIS — E119 Type 2 diabetes mellitus without complications: Secondary | ICD-10-CM | POA: Diagnosis not present

## 2015-06-03 LAB — HM DIABETES EYE EXAM

## 2015-06-11 ENCOUNTER — Telehealth: Payer: Self-pay | Admitting: Pharmacist

## 2015-06-11 NOTE — Telephone Encounter (Addendum)
Kevin Castillo is a 69 y.o. male who reports to the clinic for monitoring of rivaroxaban (Xarelto) therapy.    ASSESSMENT Indication(s): VTE Duration: indefinite Date switched from warfarin: 03/09/15  Labs:    Component Value Date/Time   AST 20 11/11/2013 1537   ALT 13 11/11/2013 1537   NA 140 05/28/2014 1046   K 3.6 05/28/2014 1046   CL 101 05/28/2014 1046   CO2 29 05/28/2014 1046   BUN 18 05/28/2014 1046   CREATININE 1.54* 05/28/2014 1046   CREATININE 1.00 11/11/2012 1315   CALCIUM 8.7 05/28/2014 1046   GFRAA 53* 05/28/2014 1046   GFRAA 89* 11/11/2012 1315   WBC 5.7 12/30/2013 1532   HGB 10.1* 12/30/2013 1532   HCT 30.8* 12/30/2013 1532   PLT 232 12/30/2013 1532    rivaroxaban (Xarelto) Dose: 20 mg daily  Adherence: Patient has no known adherence challenges. Per [employer], Rx last filled on 03/13/15. Patient states he has been getting samples from cardiologist. Contacted Dr. Doylene Canard who was able to confirm this and will continue to supply patient as feasible.  Safety: Patient reports no recent signs or symptoms of bleeding, no signs of symptoms of thrombosis. Medication changes: no.  RECOMMENDATIONS No changes at this time.  [place] Pharmacist 06/11/2015, 2:20 PM

## 2015-06-15 ENCOUNTER — Other Ambulatory Visit: Payer: Self-pay | Admitting: *Deleted

## 2015-06-15 ENCOUNTER — Encounter: Payer: Self-pay | Admitting: *Deleted

## 2015-06-15 DIAGNOSIS — I10 Essential (primary) hypertension: Secondary | ICD-10-CM

## 2015-06-15 DIAGNOSIS — N400 Enlarged prostate without lower urinary tract symptoms: Secondary | ICD-10-CM

## 2015-06-15 MED ORDER — AMLODIPINE BESYLATE 10 MG PO TABS: 10.0000 mg | ORAL_TABLET | Freq: Every day | ORAL | Status: DC

## 2015-06-15 MED ORDER — METOPROLOL SUCCINATE ER 100 MG PO TB24: 100.0000 mg | ORAL_TABLET | Freq: Every day | ORAL | Status: DC

## 2015-06-15 MED ORDER — TERAZOSIN HCL 2 MG PO CAPS: 4.0000 mg | ORAL_CAPSULE | Freq: Every day | ORAL | Status: DC

## 2015-06-23 ENCOUNTER — Encounter: Payer: Self-pay | Admitting: *Deleted

## 2015-07-08 ENCOUNTER — Encounter: Payer: Self-pay | Admitting: *Deleted

## 2015-07-27 ENCOUNTER — Ambulatory Visit: Payer: Medicare HMO | Admitting: Pharmacist

## 2015-08-10 ENCOUNTER — Other Ambulatory Visit: Payer: Self-pay | Admitting: Internal Medicine

## 2015-08-10 DIAGNOSIS — E785 Hyperlipidemia, unspecified: Secondary | ICD-10-CM

## 2015-08-10 MED ORDER — ATORVASTATIN CALCIUM 20 MG PO TABS: 20.0000 mg | ORAL_TABLET | Freq: Every day | ORAL | Status: DC

## 2015-08-12 ENCOUNTER — Other Ambulatory Visit: Payer: Self-pay | Admitting: Internal Medicine

## 2015-08-12 DIAGNOSIS — I10 Essential (primary) hypertension: Secondary | ICD-10-CM

## 2015-08-19 ENCOUNTER — Encounter: Payer: Self-pay | Admitting: Internal Medicine

## 2015-08-19 ENCOUNTER — Ambulatory Visit (INDEPENDENT_AMBULATORY_CARE_PROVIDER_SITE_OTHER): Payer: Commercial Managed Care - HMO | Admitting: Internal Medicine

## 2015-08-19 VITALS — BP 151/58 | HR 68 | Temp 98.2°F | Wt 223.3 lb

## 2015-08-19 DIAGNOSIS — I2510 Atherosclerotic heart disease of native coronary artery without angina pectoris: Secondary | ICD-10-CM

## 2015-08-19 DIAGNOSIS — E1122 Type 2 diabetes mellitus with diabetic chronic kidney disease: Secondary | ICD-10-CM | POA: Diagnosis not present

## 2015-08-19 DIAGNOSIS — N183 Chronic kidney disease, stage 3 unspecified: Secondary | ICD-10-CM

## 2015-08-19 DIAGNOSIS — I129 Hypertensive chronic kidney disease with stage 1 through stage 4 chronic kidney disease, or unspecified chronic kidney disease: Secondary | ICD-10-CM

## 2015-08-19 DIAGNOSIS — J302 Other seasonal allergic rhinitis: Secondary | ICD-10-CM

## 2015-08-19 DIAGNOSIS — F1010 Alcohol abuse, uncomplicated: Secondary | ICD-10-CM

## 2015-08-19 DIAGNOSIS — N400 Enlarged prostate without lower urinary tract symptoms: Secondary | ICD-10-CM

## 2015-08-19 DIAGNOSIS — I25810 Atherosclerosis of coronary artery bypass graft(s) without angina pectoris: Secondary | ICD-10-CM

## 2015-08-19 DIAGNOSIS — Z720 Tobacco use: Secondary | ICD-10-CM

## 2015-08-19 DIAGNOSIS — Z7901 Long term (current) use of anticoagulants: Secondary | ICD-10-CM

## 2015-08-19 DIAGNOSIS — N401 Enlarged prostate with lower urinary tract symptoms: Secondary | ICD-10-CM

## 2015-08-19 DIAGNOSIS — I739 Peripheral vascular disease, unspecified: Secondary | ICD-10-CM

## 2015-08-19 DIAGNOSIS — E785 Hyperlipidemia, unspecified: Secondary | ICD-10-CM

## 2015-08-19 DIAGNOSIS — E1151 Type 2 diabetes mellitus with diabetic peripheral angiopathy without gangrene: Secondary | ICD-10-CM

## 2015-08-19 DIAGNOSIS — I8291 Chronic embolism and thrombosis of unspecified vein: Secondary | ICD-10-CM

## 2015-08-19 DIAGNOSIS — I8290 Acute embolism and thrombosis of unspecified vein: Secondary | ICD-10-CM

## 2015-08-19 DIAGNOSIS — Z0000 Encounter for general adult medical examination without abnormal findings: Secondary | ICD-10-CM

## 2015-08-19 DIAGNOSIS — Z951 Presence of aortocoronary bypass graft: Secondary | ICD-10-CM

## 2015-08-19 DIAGNOSIS — D649 Anemia, unspecified: Secondary | ICD-10-CM | POA: Diagnosis not present

## 2015-08-19 DIAGNOSIS — R351 Nocturia: Secondary | ICD-10-CM

## 2015-08-19 DIAGNOSIS — I10 Essential (primary) hypertension: Secondary | ICD-10-CM

## 2015-08-19 DIAGNOSIS — E1169 Type 2 diabetes mellitus with other specified complication: Secondary | ICD-10-CM

## 2015-08-19 DIAGNOSIS — Z23 Encounter for immunization: Secondary | ICD-10-CM | POA: Diagnosis not present

## 2015-08-19 DIAGNOSIS — E669 Obesity, unspecified: Secondary | ICD-10-CM

## 2015-08-19 DIAGNOSIS — Z7982 Long term (current) use of aspirin: Secondary | ICD-10-CM

## 2015-08-19 DIAGNOSIS — F17200 Nicotine dependence, unspecified, uncomplicated: Secondary | ICD-10-CM

## 2015-08-19 DIAGNOSIS — D638 Anemia in other chronic diseases classified elsewhere: Secondary | ICD-10-CM

## 2015-08-19 DIAGNOSIS — N521 Erectile dysfunction due to diseases classified elsewhere: Secondary | ICD-10-CM

## 2015-08-19 DIAGNOSIS — J309 Allergic rhinitis, unspecified: Secondary | ICD-10-CM

## 2015-08-19 LAB — GLUCOSE, CAPILLARY: GLUCOSE-CAPILLARY: 109 mg/dL — AB (ref 65–99)

## 2015-08-19 LAB — POCT GLYCOSYLATED HEMOGLOBIN (HGB A1C): HEMOGLOBIN A1C: 6

## 2015-08-19 MED ORDER — TERAZOSIN HCL 5 MG PO CAPS: 5.0000 mg | ORAL_CAPSULE | Freq: Every day | ORAL | Status: DC

## 2015-08-19 NOTE — Assessment & Plan Note (Signed)
He was unable to afford the Viagra. We will keep our eyes on the Viagra prices now that it has gone generic. In the meantime, there is nothing else we have to offer other than risk factor modification which we are working on.

## 2015-08-19 NOTE — Progress Notes (Signed)
   Subjective:    Patient ID: Kevin Castillo, male    DOB: 1946/01/01, 69 y.o.   MRN: [id number]  HPI  Kevin Castillo is here for follow-up of his diabetes, hypertension, BPH, and tobacco/alcohol abuse. Please see the A&P for the status of the pt's chronic medical problems.  Review of Systems  Constitutional: Negative for activity change, appetite change and unexpected weight change.  HENT: Negative for postnasal drip, rhinorrhea, sinus pressure and sneezing.        Allergic rhinitis symptoms improved since last visit  Respiratory: Negative for chest tightness, shortness of breath and wheezing.   Cardiovascular: Negative for chest pain, palpitations and leg swelling.  Gastrointestinal: Negative for nausea, vomiting, diarrhea, constipation and abdominal distention.  Genitourinary: Positive for urgency and frequency.  Musculoskeletal: Negative for myalgias, back pain, joint swelling, arthralgias, gait problem, neck pain and neck stiffness.  Skin: Negative for rash.  Neurological: Negative for dizziness, syncope, weakness and light-headedness.      Objective:   Physical Exam  Constitutional: He is oriented to person, place, and time. He appears well-developed and well-nourished. No distress.  HENT:  Head: Normocephalic and atraumatic.  Eyes: Conjunctivae are normal. Right eye exhibits no discharge. Left eye exhibits no discharge. No scleral icterus.  Cardiovascular: Normal rate, regular rhythm and normal heart sounds.  Exam reveals no gallop and no friction rub.   No murmur heard. Pulmonary/Chest: Effort normal and breath sounds normal. No respiratory distress. He has no wheezes. He has no rales.  Abdominal: Soft. Bowel sounds are normal. He exhibits no distension. There is no tenderness. There is no rebound and no guarding.  Musculoskeletal: Normal range of motion. He exhibits no edema or tenderness.  Neurological: He is alert and oriented to person, place, and time. He exhibits normal  muscle tone.  Skin: Skin is warm and dry. No rash noted. He is not diaphoretic. No erythema.  Psychiatric: He has a normal mood and affect. His behavior is normal. Judgment and thought content normal.  Nursing note and vitals reviewed.     Assessment & Plan:   Please see problem oriented charting.

## 2015-08-19 NOTE — Assessment & Plan Note (Signed)
He received the flu vaccination today. He was also given fecal occult blood test cards to screen for colon cancer as he is not interested in colonoscopy at this time. A hepatitis C antibody was drawn and is pending at this time. We will discuss Zostavax at a future appointment. Otherwise, he is up-to-date on his preventative health care maintenance.

## 2015-08-19 NOTE — Assessment & Plan Note (Signed)
He continues to have nocturia 2 despite terazosin 4 mg by mouth every night. He admits that there are nights where he will only take 2 mg at night. His dose was increased to a 5 mg capsule each night. It is hoped this will improve his symptoms, better control his hypertension, and improve his compliance with this therapy. We will reassess his symptoms of nocturia at the follow-up visit.

## 2015-08-19 NOTE — Assessment & Plan Note (Signed)
We rediscussed the risks and benefits of continued anticoagulation. At this point he wishes to continue with his anticoagulation therapy.

## 2015-08-19 NOTE — Assessment & Plan Note (Signed)
His hemoglobin A1c today was 6.0. This is on diet alone. He was congratulated on his excellent diabetic control and encouraged to continue with his diet and work on weight loss. We will reassess his diabetes at the follow-up visit with a repeat hemoglobin A1c. A urine microalbumin was obtained today and is pending at the time of this dictation. He is otherwise up-to-date on his diabetic health care maintenance.

## 2015-08-19 NOTE — Assessment & Plan Note (Signed)
He has an anemia of likely chronic disease or chronic kidney dysfunction. A CBC was obtained during this visit and is pending at the time of this dictation. We will follow-up the results when available.

## 2015-08-19 NOTE — Assessment & Plan Note (Signed)
He continues to have claudication with walking in the parking lot. He is unable to quantify the distance before claudication develops, but he describes it is a cramping pain in both calves. This pain will resolve with sitting. We will continue to work on risk factor modification including control of his hypertension, diabetes, hyperlipidemia, and smoking. We will also continue the daily aspirin. If the claudication becomes limiting we will consider a trial of cilostazol. Because of his limitations related to his claudication he was given forms for a disabled parking permit.

## 2015-08-19 NOTE — Assessment & Plan Note (Signed)
He states that he does not drink during the week but has anywhere between 3 and 4 drinks per day on the weekend with his friends. He was encouraged to decrease his drinking to a moderate level. This will likely help with his weight is these can be empty calories. We will reassess his drinking patterns at the follow-up visit.

## 2015-08-19 NOTE — Assessment & Plan Note (Signed)
His weight is up 7 pounds in the last 4 months. He states over the summer he has not been exercising, but has been eating considerably. We discussed the importance of portion control in managing his weight and trying to be as active as he could which may also help with his claudication symptoms. He will try to watch his diet and be more active as the weather cools. We will reassess his weight at the follow-up visit.

## 2015-08-19 NOTE — Assessment & Plan Note (Signed)
His blood pressure today was slightly above target at 151/58. This is on amlodipine 10 mg by mouth daily, lisinopril 40 mg by mouth daily, metoprolol XL 100 mg by mouth daily, and Terazosin 4 mg by mouth at bedtime. He is tolerating this regimen well and denies any orthostatic changes. As he is continuing to have some nocturia and his blood pressure remains elevated we will increase the terazosin to 5 mg by mouth at night. We will continue the amlodipine, lisinopril, and metoprolol XL at the current doses. We will reassess blood pressure control at the follow-up visit.

## 2015-08-19 NOTE — Assessment & Plan Note (Signed)
He states the nicotine patches resulted in vivid dreams. He therefore stopped the nicotine patches. He states he's not ready to quit at this time from a mental standpoint. When he is, we will consider Zyban rather than Chantix given Chantix predilection to causing vivid dreams as well. In the meantime, he was encouraged to continue to think about smoking cessation especially in light of the multiple chronic medical diseases he has that would benefit from smoking cessation.

## 2015-08-19 NOTE — Assessment & Plan Note (Signed)
A basic metabolic panel was obtained during this visit and is pending at the time of this dictation. We also obtained a urine microalbumin given the diabetes to assess for evidence of a persistent microalbuminuria. This too is pending at the time of this dictation. Otherwise, we will continue with aggressive cardiovascular risk factor modification.

## 2015-08-19 NOTE — Assessment & Plan Note (Signed)
He currently denies any chest pain on his aspirin, statin, and anti-hypertensive regimen which includes Toprol XL and amlodipine that could also act as antianginals. We will continue with aggressive risk factor modification and aspirin therapy.

## 2015-08-19 NOTE — Assessment & Plan Note (Signed)
He was recently switched from Crestor to Lipitor 20 mg by mouth daily because of insurance coverage reasons. He is tolerating the atorvastatin well without myalgias. We will continue the atorvastatin at 20 mg by mouth daily.

## 2015-08-19 NOTE — Assessment & Plan Note (Signed)
He states that his symptoms of allergic rhinitis have resolved without needing his anti-histamine syrup. He states he never took this therapy, but would like to keep this medication on his list should his symptoms recur. We will reassess for symptoms consistent with allergic rhinitis at the follow-up visit.

## 2015-08-19 NOTE — Patient Instructions (Signed)
It was good to see you today.  You are doing a great job with the diabetes.  Keep up the good work!  1) We gave you the flu shot today.  2) I increased your terazosin from 4 mg each night to 5 mg each night.  This is for your blood pressure and prostate.  3) We checked your blood and urine today.  We also gave you stool cards to look for blood.  4) I will talk with you about smoking at the next visit to see if you are ready to quit.  Smoking is hurting your blood flow to your legs and that is why tey hurt with walking.  5) Keep your alcohol to no more than 1 serving a day.  I am glad you are not drinking during the week.  6) I filled out the disabled parking paperwork today.  I will see you back in 3 months, sooner if necessary.

## 2015-08-20 ENCOUNTER — Encounter: Payer: Self-pay | Admitting: Pharmacist

## 2015-08-20 LAB — BASIC METABOLIC PANEL
BUN / CREAT RATIO: 12 (ref 10–22)
BUN: 29 mg/dL — AB (ref 8–27)
CO2: 22 mmol/L (ref 18–29)
CREATININE: 2.41 mg/dL — AB (ref 0.76–1.27)
Calcium: 8.7 mg/dL (ref 8.6–10.2)
Chloride: 102 mmol/L (ref 97–108)
GFR, EST AFRICAN AMERICAN: 31 mL/min/{1.73_m2} — AB (ref >59)
GFR, EST NON AFRICAN AMERICAN: 26 mL/min/{1.73_m2} — AB (ref >59)
Glucose: 101 mg/dL — ABNORMAL HIGH (ref 65–99)
Potassium: 4.2 mmol/L (ref 3.5–5.2)
SODIUM: 143 mmol/L (ref 134–144)

## 2015-08-20 LAB — CBC
HEMATOCRIT: 32.8 % — AB (ref 37.5–51.0)
Hemoglobin: 10.6 g/dL — ABNORMAL LOW (ref 12.6–17.7)
MCH: 28.6 pg (ref 26.6–33.0)
MCHC: 32.3 g/dL (ref 31.5–35.7)
MCV: 89 fL (ref 79–97)
PLATELETS: 180 10*3/uL (ref 150–379)
RBC: 3.7 x10E6/uL — ABNORMAL LOW (ref 4.14–5.80)
RDW: 18.1 % — AB (ref 12.3–15.4)
WBC: 4.3 10*3/uL (ref 3.4–10.8)

## 2015-08-20 LAB — MICROALBUMIN / CREATININE URINE RATIO
Creatinine, Urine: 132.7 mg/dL
MICROALB/CREAT RATIO: 1716.9 mg/g creat — ABNORMAL HIGH (ref 0.0–30.0)
MICROALBUM., U, RANDOM: 2278.3 ug/mL

## 2015-08-20 LAB — HEPATITIS C ANTIBODY: Hep C Virus Ab: <0.1 s/co ratio (ref 0.0–0.9)

## 2015-08-21 ENCOUNTER — Ambulatory Visit: Payer: Medicare HMO | Admitting: Internal Medicine

## 2015-08-21 ENCOUNTER — Telehealth: Payer: Self-pay | Admitting: Pharmacist

## 2015-08-21 NOTE — Telephone Encounter (Signed)
Contacted patient to follow up on rivaroxaban. Patient reports no signs or symptoms of bleeding or thromboembolism and states he is able to afford the medication.   Signing off of case at this time due to successful transition from warfarin to rivaroxaban and will continue to be available for assistance with patient care in the future as necessary. Patient verbalized understanding and was advised to contact me if further medication assistance needed.

## 2015-08-21 NOTE — Addendum Note (Signed)
Addended by: Oval Linsey D on: 08/21/2015 04:50 PM   Modules accepted: Orders

## 2015-08-21 NOTE — Telephone Encounter (Signed)
documented in 08/21/15 note

## 2015-08-21 NOTE — Progress Notes (Signed)
CBC Hgb 10.6, Hct 32.8, Plt 180, WBC 4.3  Anemia of chronic kidney disease is stable  BMP: K 4.2, HCO3 22, BUN 29, Cr 2.41, eGFR 31  Worsening renal function.  Has BOO symptoms for which we are escalating the terazosin (which is also being used to address the HTN as well).  Will check a renal ultrasound to look for evidence of a post-obstructive process worsening the renal function.  We will repeat the BMP at the follow-up visit, pending the results of the renal ultrasound.  Creatinine 132.7 Urine microalbumin 2278.3 Urine microalbumin/creatinine ratio 1716.9  Already on max dose lisinopril.  Do not want to switch out the beta blocker (being used for secondary prevention given CAD) for a diltiazem or Verapamil for the microalbuminuria.  Will continue to aggressively work on hypertension.  Hep C Ab Negative

## 2015-08-25 ENCOUNTER — Telehealth: Payer: Self-pay | Admitting: *Deleted

## 2015-08-25 NOTE — Telephone Encounter (Signed)
Call made to patient informing him of next avail appt time/date with [facility] 10/16/15 @ 10:45am, and will also check for cancellations.  Pt was also informed that renal ultrasound is pending authorization from his insurance company and once approved, I will contact him with an appt (pt prefers an afternoon appt.).  Pt verbalized understanding.Kevin Hidden Cassady9/6/20162:18 PM

## 2015-08-25 NOTE — Telephone Encounter (Signed)
-----   Message from Oval Linsey, MD sent at 08/21/2015  4:39 PM EDT ----- Regarding: Renal Ultrasound ordered Dear [name],  Please schedule the renal ultrasound I just ordered for [name].  Also change his follow-up appointment to the next available non-overbook appointment with me as I would like to see him sooner than I thought before I had his lab results back.  I realize this may be slightly more than 1 month out.  Thanks,   Fritz Pickerel

## 2015-09-02 DIAGNOSIS — E119 Type 2 diabetes mellitus without complications: Secondary | ICD-10-CM | POA: Diagnosis not present

## 2015-09-04 NOTE — Telephone Encounter (Addendum)
Please notify patient he has been sch on 09/10/2015 @ [facility] on [address] Floor. Patient is to arrive @ 12:45 for a 1 pm appt.-pt informed, will also mail appt.Despina Hidden Cassady9/16/201610:17 AM  Bladder must be full

## 2015-09-10 ENCOUNTER — Ambulatory Visit (HOSPITAL_COMMUNITY): Payer: Commercial Managed Care - HMO

## 2015-09-18 ENCOUNTER — Ambulatory Visit (HOSPITAL_COMMUNITY): Payer: Commercial Managed Care - HMO

## 2015-10-12 ENCOUNTER — Telehealth: Payer: Self-pay | Admitting: Pharmacist

## 2015-10-12 DIAGNOSIS — I8290 Acute embolism and thrombosis of unspecified vein: Secondary | ICD-10-CM

## 2015-10-12 NOTE — Telephone Encounter (Signed)
Patient called with medication question---tried calling patient back but unable to reach. Left message.

## 2015-10-13 MED ORDER — RIVAROXABAN 20 MG PO TABS: 20.0000 mg | ORAL_TABLET | Freq: Every day | ORAL | Status: DC

## 2015-10-13 NOTE — Telephone Encounter (Addendum)
Patient called back stating that his mail order pharmacy is filling Xarelto for 90 days supply which is too expensive for him. Patient is out of the medication today and is requesting 30-day Rx be sent to [facility].  [facility] to discontinue Rx for Xarelto.

## 2015-10-13 NOTE — Addendum Note (Signed)
Addended by: Forde Dandy on: 10/13/2015 11:24 AM   Modules accepted: Orders

## 2015-10-16 ENCOUNTER — Ambulatory Visit: Payer: Medicare HMO | Admitting: Internal Medicine

## 2015-11-02 ENCOUNTER — Ambulatory Visit (INDEPENDENT_AMBULATORY_CARE_PROVIDER_SITE_OTHER): Payer: Commercial Managed Care - HMO | Admitting: Internal Medicine

## 2015-11-02 ENCOUNTER — Encounter: Payer: Self-pay | Admitting: Internal Medicine

## 2015-11-02 VITALS — BP 175/79 | HR 78 | Temp 98.0°F | Wt 220.7 lb

## 2015-11-02 DIAGNOSIS — F320 Major depressive disorder, single episode, mild: Secondary | ICD-10-CM

## 2015-11-02 DIAGNOSIS — I8290 Acute embolism and thrombosis of unspecified vein: Secondary | ICD-10-CM

## 2015-11-02 DIAGNOSIS — N183 Chronic kidney disease, stage 3 unspecified: Secondary | ICD-10-CM

## 2015-11-02 DIAGNOSIS — Z720 Tobacco use: Secondary | ICD-10-CM

## 2015-11-02 DIAGNOSIS — I25719 Atherosclerosis of autologous vein coronary artery bypass graft(s) with unspecified angina pectoris: Secondary | ICD-10-CM

## 2015-11-02 DIAGNOSIS — E1122 Type 2 diabetes mellitus with diabetic chronic kidney disease: Secondary | ICD-10-CM

## 2015-11-02 DIAGNOSIS — E66811 Obesity, class 1: Secondary | ICD-10-CM

## 2015-11-02 DIAGNOSIS — E1169 Type 2 diabetes mellitus with other specified complication: Secondary | ICD-10-CM | POA: Diagnosis not present

## 2015-11-02 DIAGNOSIS — N521 Erectile dysfunction due to diseases classified elsewhere: Secondary | ICD-10-CM

## 2015-11-02 DIAGNOSIS — E669 Obesity, unspecified: Secondary | ICD-10-CM

## 2015-11-02 DIAGNOSIS — F329 Major depressive disorder, single episode, unspecified: Secondary | ICD-10-CM

## 2015-11-02 DIAGNOSIS — E785 Hyperlipidemia, unspecified: Secondary | ICD-10-CM

## 2015-11-02 DIAGNOSIS — F1010 Alcohol abuse, uncomplicated: Secondary | ICD-10-CM

## 2015-11-02 DIAGNOSIS — Z0000 Encounter for general adult medical examination without abnormal findings: Secondary | ICD-10-CM

## 2015-11-02 DIAGNOSIS — N400 Enlarged prostate without lower urinary tract symptoms: Secondary | ICD-10-CM

## 2015-11-02 DIAGNOSIS — I10 Essential (primary) hypertension: Secondary | ICD-10-CM

## 2015-11-02 HISTORY — DX: Major depressive disorder, single episode, unspecified: F32.9

## 2015-11-02 LAB — POCT GLYCOSYLATED HEMOGLOBIN (HGB A1C): Hemoglobin A1C: 5.9

## 2015-11-02 LAB — GLUCOSE, CAPILLARY: Glucose-Capillary: 132 mg/dL — ABNORMAL HIGH (ref 65–99)

## 2015-11-02 MED ORDER — SILDENAFIL CITRATE 20 MG PO TABS: 100.0000 mg | ORAL_TABLET | Freq: Every day | ORAL | Status: DC | PRN

## 2015-11-02 MED ORDER — TERAZOSIN HCL 10 MG PO CAPS: 10.0000 mg | ORAL_CAPSULE | Freq: Every day | ORAL | Status: DC

## 2015-11-02 MED ORDER — BUPROPION HCL 100 MG PO TABS: 100.0000 mg | ORAL_TABLET | Freq: Two times a day (BID) | ORAL | Status: DC

## 2015-11-02 NOTE — Assessment & Plan Note (Signed)
Assessment  He continues to smoke but has had difficulty in quitting because of the stress in his life as noted above. That being said, he has been able to cut down to about 3 cigarettes per day and would like to quit.  Plan  He was encouraged regarding the progress he made in cutting back on his cigarettes. We discussed the importance of tobacco cessation and he will be started on Wellbutrin 100 mg by mouth twice daily to help assist with tobacco cessation, especially in the setting of his newly diagnosed single episode of major depression. Please see below.

## 2015-11-02 NOTE — Assessment & Plan Note (Signed)
Assessment  He remains obese with a BMI of approximately 34. He has lost 3 pounds since the last visit.  Plan  He was praised for the 3 pounds that he is loss since the last visit. We discussed the fact that his drinking on the weekends may increase his weight given the fact that those are empty calories. Please see below.

## 2015-11-02 NOTE — Assessment & Plan Note (Signed)
Assessment  His diabetes remains extremely well controlled with diet alone. His hemoglobin A1c today was 5.9.  Plan  He was praised on his excellent control of his diabetes with diet and exercise alone. He was encouraged to continue with his current lifestyle with regards to his diabetes. We will reassess his diabetic control at the follow-up visit with a hemoglobin A1c.

## 2015-11-02 NOTE — Assessment & Plan Note (Signed)
Assessment  He is tolerating the atorvastatin 20 mg by mouth daily without myalgias.  Plan  We will continue the atorvastatin at 20 mg by mouth daily and reassess for adverse effects of the medication at the follow-up visit.

## 2015-11-02 NOTE — Assessment & Plan Note (Signed)
Assessment  He is tolerating the rivaroxaban at 20 mg by mouth daily without difficulty. He has no signs or symptoms of a recurrent deep venous thrombosis.  Plan  Continue the rivaroxaban at 20 mg by mouth daily. We will reassess at the follow-up visit.

## 2015-11-02 NOTE — Assessment & Plan Note (Signed)
Assessment  With his anhedonia, excessive sleeping, as well as early awakening and difficulty falling back to sleep I am concerned he may have mild major depression. He is in agreement with this assessment and is willing to consider pharmacologic therapy if we think it might help his symptoms.  Plan  We will start bupropion 100 mg by mouth twice daily for depression and reassess his symptoms at the follow-up visit.

## 2015-11-02 NOTE — Patient Instructions (Addendum)
It was great to see you again.  I am sorry about the current stresses in your life.  1) Increase the terazosin to 10 mg daily for your blood pressure and urine stream.  2) Start bupropion 100 mg by mouth twice daily for the stress in your life.  It may also help you quit smoking and lose weight.  3) Restart your aspirin 81 mg daily.  4) Try Ravitio 100 mg (5 tablets) as needed for intercourse.  I gave you a printed prescription and [name] gave you a discount coupon.  It is cheapest at [facility] or [facility]  Hold your terazosin for 1 day when you take this medication.  5) Take the other medications as you have been.  6) We drew blood today.  I will call you with the results.  If the kidneys still are not working well we will get that ultrasound of the kidneys.  7) Please send in the stool cards.  I will see you in 3 months, sooner if necessary.

## 2015-11-02 NOTE — Assessment & Plan Note (Signed)
Assessment  He continues to consume about a pint of gin on weekends. He states he does not drink during the week.  Plan  We briefly discussed the importance of cutting down on his drinking during the weekends. He was praised for continuing not to drink during the week. We will reassess his weekend drinking at the follow-up visit.

## 2015-11-02 NOTE — Assessment & Plan Note (Signed)
Assessment  Despite increasing the Terazosin from 4 mg each night to 5 mg each night he continues to have nocturia 3-4 per night.  Plan  We will increase theTerazosin from 5 mg to 10 mg each night and reassess his nocturia at the follow-up visit. He was warned about the potential postural changes associated with taking this medication.

## 2015-11-02 NOTE — Assessment & Plan Note (Signed)
Assessment  His erectile dysfunction is likely multifactorial including secondary to his diabetes, hypertension, and continuing tobacco abuse. In the past he has not been able to afford pharmacologic therapy for his erectile disc function including the Viagra. With the generic sildenafil for pulmonary hypertension available he may be able to afford this therapy at this time with a discount coupon. He is interested in giving this therapy a try if it is affordable.  Plan  Generic sidenafil 20 mg by mouth 5 tablets (100 mg) as needed prior to intercourse was prescribed. He was given 30 tablets with 6 refills and a coupon. His wife is a member of [employer] at which pharmacy 30 pills would cost $10. [facility] the prescription for 30 tablets for $25. He was informed of these options and given a printed prescription for the medication to take to the pharmacy of his choice where the medication may be more affordable. We will reassess whether or not he picked this medication up and what his response was at the follow-up visit. He was asked not to take the terazosin on the days in which she was going to use the sidenafil.

## 2015-11-02 NOTE — Assessment & Plan Note (Signed)
Assessment  He did not turn in the stool cards given to him at the last visit as he has not gotten around to it. We discussed the importance of colon cancer screening.  Plan  He states he will submit the stool cards that he received at the last visit. Otherwise, he is up-to-date on his health maintenance.

## 2015-11-02 NOTE — Assessment & Plan Note (Signed)
Assessment  His blood pressure today was elevated at 175/79. This is despite the amlodipine 10 mg by mouth daily, lisinopril 40 mg by mouth daily, and Toprol-XL 100 mg by mouth daily. At the last visit we had increased the tear Zosyn from 4 mg by mouth daily to 5 mg by mouth daily. Despite this his blood pressure is not at target.  Plan  We will increase the tear Zosyn to 10 mg by mouth each night given his hypertension and continued nocturia. We will also continue the amlodipine at 10 mg by mouth daily, lisinopril at 40 mg by mouth daily, and Toprol-XL at 100 mg by mouth daily. We will reassess his blood pressure control at the follow-up visit.

## 2015-11-02 NOTE — Assessment & Plan Note (Signed)
Assessment  He never went to get the renal ultrasound to assess his worsening renal function discovered at the last clinic visit. He said he was scared. We had a long discussion about the risks and benefits of a renal ultrasound and I assured him that it was a painless test. I also discussed the importance of following up on his renal function to avoid long-term complications, even the need for chronic hemodialysis. We discussed the issue of prostate hypertrophy and obstruction of urine that could back up into the kidneys. I then connected the reason why we wanted the renal ultrasound to his bladder outlet obstruction symptoms. He is now interested in getting the renal ultrasound should his repeat kidney function show continued worsening.  Plan  A basic metabolic panel was drawn today and is pending at the time of this dictation. If the kidney function returns to his baseline there will be no need for further assessment with a renal ultrasound. If it remains similar to the last value or even worse he was told that I would highly recommend we get a renal ultrasound to assess for obstruction. He was amenable to this plan.

## 2015-11-02 NOTE — Progress Notes (Signed)
   Subjective:    Patient ID: Kevin Castillo, male    DOB: [birth year], 69 y.o.   MRN: [id number]  HPI  Kevin Castillo is here for follow-up of his hypertension, bladder outlet obstruction symptoms, diabetes. Please see the A&P for the status of the pt's chronic medical problems.  He notes that he has been under some stress lately as he has had to care for his grandchildren from both of his daughters. He finds these children do not listen to him or his wife and they are not used to having children in the house for the last 15 years. Because of the stress he sleeps a lot during the day and then has trouble sleeping at night. He has early awakenings around midnight to 1 AM and then can't get back to sleep again. Along with these difficulties in sleeping and increased stress there may be some anhedonia.   Review of Systems  Constitutional: Positive for fatigue. Negative for activity change, appetite change and unexpected weight change.  Respiratory: Negative for chest tightness, shortness of breath and wheezing.   Cardiovascular: Negative for chest pain, palpitations and leg swelling.  Gastrointestinal: Negative for nausea, vomiting, abdominal pain, diarrhea and constipation.  Genitourinary: Positive for urgency, frequency and difficulty urinating.  Musculoskeletal: Negative for myalgias, back pain and arthralgias.  Neurological: Positive for dizziness and light-headedness. Negative for syncope and weakness.       He has had one or 2 episodes of dizziness and lightheadedness without vertigo. These do not appear to be positional in nature with regards to change in position although he has been standing on both occasions for a fair amount of time before he developed the dizziness and the need to sit down. These episodes have not been associated with chest pain, palpitations, or shortness of breath.  Psychiatric/Behavioral: Positive for depression, sleep disturbance and dysphoric mood. Negative for  suicidal ideas and self-injury.      Objective:   Physical Exam  Constitutional: He is oriented to person, place, and time. He appears well-developed and well-nourished. No distress.  HENT:  Head: Normocephalic and atraumatic.  Eyes: Conjunctivae are normal. Right eye exhibits no discharge. Left eye exhibits no discharge. No scleral icterus.  Cardiovascular: Normal rate and regular rhythm.  Exam reveals no gallop and no friction rub.   Murmur heard. Pulmonary/Chest: Effort normal and breath sounds normal. No respiratory distress. He has no wheezes. He has no rales.  Abdominal: Soft. Bowel sounds are normal. He exhibits no distension. There is no tenderness. There is no rebound and no guarding.  Musculoskeletal: Normal range of motion. He exhibits no edema or tenderness.  Neurological: He is alert and oriented to person, place, and time. He exhibits normal muscle tone.  Skin: Skin is warm and dry. He is not diaphoretic.  Psychiatric: He has a normal mood and affect. His behavior is normal. Judgment and thought content normal.  Nursing note and vitals reviewed.     Assessment & Plan:   Please see problem based charting.

## 2015-11-02 NOTE — Assessment & Plan Note (Signed)
Assessment  He has had no further angina on his current antianginal therapy including the amlodipine at 10 mg by mouth daily and Toprol-XL 100 mg by mouth daily. He has not been taking his aspirin at 81 mg by mouth daily.  Plan  We will continue the Toprol-XL at 100 mg by mouth daily, amlodipine at 10 mg by mouth daily, atorvastatin at 20 mg by mouth daily, and he was asked to restart the aspirin 81 mg by mouth daily. We will reassess for angina at the follow-up visit.

## 2015-11-03 LAB — BMP8+ANION GAP
ANION GAP: 16 mmol/L (ref 10.0–18.0)
BUN / CREAT RATIO: 14 (ref 10–22)
BUN: 33 mg/dL — AB (ref 8–27)
CHLORIDE: 105 mmol/L (ref 97–106)
CO2: 22 mmol/L (ref 18–29)
Calcium: 8.6 mg/dL (ref 8.6–10.2)
Creatinine, Ser: 2.33 mg/dL — ABNORMAL HIGH (ref 0.76–1.27)
GFR calc Af Amer: 32 mL/min/{1.73_m2} — ABNORMAL LOW (ref >59)
GFR calc non Af Amer: 27 mL/min/{1.73_m2} — ABNORMAL LOW (ref >59)
GLUCOSE: 131 mg/dL — AB (ref 65–99)
Potassium: 4.2 mmol/L (ref 3.5–5.2)
Sodium: 143 mmol/L (ref 136–144)

## 2015-11-04 ENCOUNTER — Telehealth: Payer: Self-pay | Admitting: *Deleted

## 2015-11-04 NOTE — Telephone Encounter (Signed)
Wife of pt called about pt - problems with balance and dizziness at least 6-8 weeks. Pt did not say anything about this at last visit according to pt. Appt made  11/06/15 2:15PM Dr Merrilyn Puma. Hilda Blades Keyshawna Prouse RN 11/04/15 3:50PM

## 2015-11-06 ENCOUNTER — Encounter: Payer: Self-pay | Admitting: Internal Medicine

## 2015-11-06 ENCOUNTER — Ambulatory Visit (INDEPENDENT_AMBULATORY_CARE_PROVIDER_SITE_OTHER): Payer: Commercial Managed Care - HMO | Admitting: Internal Medicine

## 2015-11-06 VITALS — BP 143/75 | HR 67 | Temp 97.8°F | Ht 67.0 in | Wt 221.6 lb

## 2015-11-06 DIAGNOSIS — I951 Orthostatic hypotension: Secondary | ICD-10-CM

## 2015-11-06 DIAGNOSIS — F320 Major depressive disorder, single episode, mild: Secondary | ICD-10-CM

## 2015-11-06 LAB — GLUCOSE, CAPILLARY: GLUCOSE-CAPILLARY: 126 mg/dL — AB (ref 65–99)

## 2015-11-06 MED ORDER — BUPROPION HCL 100 MG PO TABS: 100.0000 mg | ORAL_TABLET | Freq: Two times a day (BID) | ORAL | Status: DC

## 2015-11-06 NOTE — Progress Notes (Signed)
   Patient ID: Kevin Castillo male   DOB: 1946-12-19 69 y.o.   MRN: [id number]  Subjective:   HPI: [name] is a 69 y.o. with PMH of HTN, well-controlled DM, bladder outlet obstruction, EtOH abuse, CAD s/p CABG who presents to [facility] today for evaluation of lightheadedness for the past 8 weeks. He describes the symptoms as 'lightheadedness' and 'like he's gonna fall' with associated transient blurred vision. These episodes occur when he first rises from a seated position and also after he has been standing for a prolonged period of time. He denies diaphoresis, syncope, falls, shortness of breath, chest pain, palpitations, N/V/D, any focal numbness/tingling, weakness, hearing loss or tinnitus, or feeling like the room is spinning/vertigo. He says since increasing his terazosin, his nocturia has actually resolved and has been able to sleep through the night but these lightheadedness symptoms are happening more frequently now, almost daily. He has no other issues at this time.  Please see problem-based charting for status of medical issues pertinent to this visit.  Review of Systems: Pertinent items noted in HPI and remainder of comprehensive ROS otherwise negative.  Objective:  Physical Exam: Filed Vitals:   11/06/15 1450 11/06/15 1453 11/06/15 1454 11/06/15 1456  BP:  169/82 156/74 143/75  Pulse:  70 65 67  Temp: 97.8 F (36.6 C)     TempSrc: Oral     Height: 5\' 7"  (1.702 m)     Weight: 221 lb 9.6 oz (100.517 kg)     SpO2: 100%      Gen: Well-appearing, alert and oriented to person, place, and time HEENT: Oropharynx clear without erythema or exudate.  Neck: No cervical LAD, no thyromegaly or nodules, no JVD noted. CV: Normal rate, regular rhythm, no murmurs, rubs, or gallops Pulmonary: Normal effort, CTA bilaterally, no wheezing, rales, or rhonchi Abdominal: Soft, non-tender, non-distended, without rebound, guarding, or masses Extremities: Distal pulses 2+ in upper and lower  extremities bilaterally, no tenderness, erythema or edema Neuro: CN II-XII grossly intact, normal strength, sensation bilaterally. 2+ reflexes throughout. Normal coordination. Normal gait. Romberg negative. Skin: No atypical appearing moles. No rashes  Assessment & Plan:  Please see problem-based charting for assessment and plan.  Blane Ohara, MD Resident Physician, PGY-1 Department of Internal Medicine [facility]

## 2015-11-06 NOTE — Assessment & Plan Note (Signed)
Pt has lightheadedness and transient blurry vision without s/s suggestive of vertigo. Denies any CV or other presyncopal symptoms. Orthostatic here today. -Instructed patient on getting slow to rise, increased hydration especially in the AM. -Will continue at current dose of tamsulosin for now - pt finally able to get through night without urinating -Told patient to let us know if symptoms do not resolve in the next ~2 week, and will reassess at that point regarding further management

## 2015-11-06 NOTE — Patient Instructions (Signed)
Mr. Mcgugan,  Make sure to drink lots of water, especially in the morning.  Also, make sure to get up slowly and not stand for too long a period of time.  Please let us know if your lightheadedness doesn't get better in the next couple of weeks. Also, let us know if your symptoms get worse.  We think that your medications may be contributing to this and it may take time for your body to adjust. Have a great Thanksgiving.  Thanks, Blane Ohara

## 2015-11-09 NOTE — Progress Notes (Signed)
Internal Medicine Clinic Attending  I saw and evaluated the patient.  I personally confirmed the key portions of the history and exam documented by Dr. Kennedy and I reviewed pertinent patient test results.  The assessment, diagnosis, and plan were formulated together and I agree with the documentation in the resident's note.  

## 2015-11-10 MED ORDER — TERAZOSIN HCL 5 MG PO CAPS: 5.0000 mg | ORAL_CAPSULE | Freq: Every day | ORAL | Status: DC

## 2015-11-10 NOTE — Progress Notes (Signed)
BMP  K 4.2, BUN 33, Cr 2.33, eGFR 32.  Renal function decline remains prominent.  I called Kevin Castillo and he is now willing to have further evaluation with a renal ultrasound.  I will place this order.  I also discussed the dizziness he had when going to 10 mg on the terazosin.  He is now down to 5 mg again and is doing better from a dizziness standpoint.  We still need to address the nocturia.  Some options we will discuss at the follow-up visit include a trial of tamsulosin or starting finasteride. 

## 2015-11-10 NOTE — Addendum Note (Signed)
Addended by: Oval Linsey D on: 11/10/2015 03:31 PM   Modules accepted: Orders, Medications

## 2015-12-04 ENCOUNTER — Ambulatory Visit (HOSPITAL_COMMUNITY)
Admission: RE | Admit: 2015-12-04 | Discharge: 2015-12-04 | Disposition: A | Discharge status: Home / Self Care | Payer: Commercial Managed Care - HMO | Source: Ambulatory Visit | Attending: Internal Medicine | Admitting: Internal Medicine

## 2015-12-04 DIAGNOSIS — N281 Cyst of kidney, acquired: Secondary | ICD-10-CM | POA: Insufficient documentation

## 2015-12-04 DIAGNOSIS — N183 Chronic kidney disease, stage 3 unspecified: Secondary | ICD-10-CM

## 2015-12-04 DIAGNOSIS — R93421 Abnormal radiologic findings on diagnostic imaging of right kidney: Secondary | ICD-10-CM | POA: Diagnosis not present

## 2015-12-04 DIAGNOSIS — R93422 Abnormal radiologic findings on diagnostic imaging of left kidney: Secondary | ICD-10-CM | POA: Diagnosis not present

## 2015-12-16 ENCOUNTER — Ambulatory Visit (INDEPENDENT_AMBULATORY_CARE_PROVIDER_SITE_OTHER): Payer: Commercial Managed Care - HMO | Admitting: Internal Medicine

## 2015-12-16 VITALS — BP 151/57 | HR 68 | Temp 98.0°F | Wt 226.9 lb

## 2015-12-16 DIAGNOSIS — I8290 Acute embolism and thrombosis of unspecified vein: Secondary | ICD-10-CM | POA: Diagnosis not present

## 2015-12-16 DIAGNOSIS — Z7901 Long term (current) use of anticoagulants: Secondary | ICD-10-CM

## 2015-12-16 DIAGNOSIS — Z86718 Personal history of other venous thrombosis and embolism: Secondary | ICD-10-CM | POA: Diagnosis not present

## 2015-12-16 DIAGNOSIS — I10 Essential (primary) hypertension: Secondary | ICD-10-CM | POA: Diagnosis not present

## 2015-12-16 DIAGNOSIS — R600 Localized edema: Secondary | ICD-10-CM

## 2015-12-16 DIAGNOSIS — I739 Peripheral vascular disease, unspecified: Secondary | ICD-10-CM

## 2015-12-16 MED ORDER — AMLODIPINE BESYLATE 5 MG PO TABS
5.0000 mg | ORAL_TABLET | Freq: Every day | ORAL | Status: DC
Start: 2015-12-16 — End: 2016-01-15

## 2015-12-16 NOTE — Progress Notes (Signed)
Patient ID: Kevin Castillo, male   DOB: May 13, 1946, 69 y.o.MRN: [id number]     Subjective:   Patient ID: Kevin Castillo male    DOB: [birth year] 69 y.o.    MRN: [id number] Health Maintenance Due: Health Maintenance Due  Topic Date Due  . COLONOSCOPY  01/26/1996  . ZOSTAVAX  01/25/2006  . LIPID PANEL  11/11/2014  . COLON CANCER SCREENING ANNUAL FOBT  11/18/2014    _________________________________________________  HPI: Kevin Castillo is a 69 y.o. male here for an acute visit for lower extremity swelling.  Pt has a PMH outlined below.  Please see problem-based charting assessment and plan for further status of patient's chronic medical problems addressed at today's visit.  PMH: Past Medical History  Diagnosis Date  . Type 2 diabetes mellitus with stage 3 chronic kidney disease ([place]) 02/15/2007  . Chronic kidney disease, stage 3 11/12/2013  . Erectile dysfunction associated with type 2 diabetes mellitus ([place]) 02/15/2007  . Essential hypertension 02/15/2007  . Hyperlipidemia 02/15/2007  . Tobacco abuse 02/15/2007  . Obesity (BMI 30.0-34.9) 02/15/2007  . Coronary artery disease 02/15/2007    Non-STEMI 07/2005, s/p CABG x 4 (08/01/2005): LIMA to LAD, SVG to OM, RCA, PCA   . Peripheral vascular occlusive disease ([place]) 12/30/2013    ABI (01/03/2014): Right 0.64, Left 0.64   . Venous thromboembolism 11/11/2013    LE Dopplers (01/2011): RLE DVT. CTA with mild subacute to chronic right-sided pulmonary emboli.  Patient has elected to continue anticoagulation.   . Alcohol abuse 02/15/2007    Reports consuming 1 pint of gin on weekends, does not drink every day. Denies history of seizures, blackouts, or tremors.    . Anemia 11/12/2013    Unclear cause as of yet (? EtOH abuse)   . Benign prostatic hypertrophy 06/02/2014  . Allergic rhinitis 04/09/2014  . Adenoma of left adrenal gland 06/16/2014    CT Abdomen (06/04/2014): Incidental, 20 mm, 8.33 HU suggesting benign adenoma   . Major depression,  single episode 11/02/2015    Medications: Current Outpatient Prescriptions on File Prior to Visit  Medication Sig Dispense Refill  . aspirin 81 MG tablet Take 81 mg by mouth daily.    Marland Kitchen atorvastatin (LIPITOR) 20 MG tablet Take 1 tablet (20 mg total) by mouth daily. 90 tablet 3  . buPROPion (WELLBUTRIN) 100 MG tablet Take 1 tablet (100 mg total) by mouth 2 (two) times daily. 180 tablet 3  . lisinopril (PRINIVIL,ZESTRIL) 40 MG tablet Take 1 tablet (40 mg total) by mouth daily. 90 tablet 3  . metoprolol succinate (TOPROL-XL) 100 MG 24 hr tablet Take 1 tablet (100 mg total) by mouth daily. 90 tablet 3  . rivaroxaban (XARELTO) 20 MG TABS tablet Take 1 tablet (20 mg total) by mouth daily with supper. 30 tablet 11  . sildenafil (REVATIO) 20 MG tablet Take 5 tablets (100 mg total) by mouth daily as needed. 30 tablet 5  . terazosin (HYTRIN) 5 MG capsule Take 1 capsule (5 mg total) by mouth at bedtime. 90 capsule 3   No current facility-administered medications on file prior to visit.    Allergies: Allergies  Allergen Reactions  . Nicotine Transdermal System [Nicotine] Other (See Comments)    Vivid dreams    FH: Family History  Problem Relation Age of Onset  . Heart disease Father   . COPD Father   . Diabetes Mellitus II Mother   . Healthy Sister   . Healthy Brother   . Healthy Daughter   . Healthy  Son   . Healthy Sister   . Healthy Sister   . Healthy Sister   . Healthy Sister   . Healthy Daughter   . Healthy Son   . Healthy Son   . Healthy Son   . Healthy Son   . Healthy Son   . Healthy Son     SH: Social History   Social History  . Marital Status: Married    Spouse Name: N/A  . Number of Children: N/A  . Years of Education: 10   Occupational History  . Retired     used to drive a truck locally   [place]  . Smoking status: Current Some Day Smoker -- 0.25 packs/day for 50 years    Types: Cigarettes  . Smokeless tobacco: Never Used     Comment:  trying to quit/ PATIENT STATES HE HAS CUT BACK-SMOKE 3 CIGARETTES A DAY  . Alcohol Use: 0.0 oz/week    0 Standard drinks or equivalent per week     Comment: Drinks 1 pint of gin over the weekend but does not drink everyday. Denies hx of blackouts, seizures, tremors.  . Drug Use: No  . Sexual Activity: Yes    Birth Control/ Protection: None   Other Topics Concern  . Not on file   Social History Narrative   Married, lives with his wife in [place]. 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 Only one son and one daughter live in [place]. No pets.    Review of Systems: Constitutional: Negative for fever, chills and weight loss.  Eyes: Negative for blurred vision.  Respiratory: Negative for cough and shortness of breath.  Cardiovascular: Negative for chest pain, palpitations and +leg swelling.  Gastrointestinal: Negative for nausea, vomiting, abdominal pain, diarrhea, constipation and blood in stool.  Genitourinary: Negative for dysuria, urgency and frequency.  Musculoskeletal: Negative for myalgias and back pain.  Neurological: Negative for dizziness, weakness and headaches.     Objective:   Vital Signs: Filed Vitals:   12/16/15 1501  BP: 151/57  Pulse: 68  Temp: 98 F (36.7 C)  TempSrc: Oral  Weight: 226 lb 14.4 oz (102.921 kg)  SpO2: 100%     BP Readings from Last 3 Encounters:  12/16/15 151/57  11/06/15 143/75  11/02/15 175/79    Physical Exam: Constitutional: Vital signs reviewed.  Patient is in NAD and cooperative with exam.  Head: Normocephalic and atraumatic. Eyes: EOMI, conjunctivae nl, no scleral icterus.  Neck: Supple. Cardiovascular: RRR, no MRG. Pulmonary/Chest: normal effort, CTAB, no wheezes, rales, or rhonchi. Abdominal: Soft. NT/ND +BS. Neurological: A&O x3, cranial nerves II-XII are grossly intact, moving all extremities. Extremities: Cool, 1+DP b/l; 1-2+ pitting edema b/l with chronic venous stasis changes.  No erythema or warmth in  LE b/l.  Skin: Dry and intact.    Assessment & Plan:   Assessment and plan was discussed and formulated with my attending.

## 2015-12-16 NOTE — Patient Instructions (Signed)
Thank you for your visit today.   Please return to the internal medicine clinic in 1 month(s) or sooner if needed.     I have made the following additions/changes to your medications:  We are going to decrease your amlodipine to 5mg  daily and see if this helps your leg swelling.  Please try to keep your feet elevated above the level of your heart as much as possible.   Please adhere to a low salt diet of less than 2000mg  per day, this will help with your swelling.    Please be sure to bring all of your medications with you to every visit; this includes herbal supplements, vitamins, eye drops, and any over-the-counter medications.   Should you have any questions regarding your medications and/or any new or worsening symptoms, please be sure to call the clinic at [phone].   If you believe that you are suffering from a life threatening condition or one that may result in the loss of limb or function, then you should call 911 and proceed to the nearest Emergency Department.  Edema Edema is an abnormal buildup of fluids. It is more common in your legs and thighs. Painless swelling of the feet and ankles is more likely as a person ages. It also is common in looser skin, like around your eyes. HOME CARE   Keep the affected body part above the level of the heart while lying down.  Do not sit still or stand for a long time.  Do not put anything right under your knees when you lie down.  Do not wear tight clothes on your upper legs.  Exercise your legs to help the puffiness (swelling) go down.  Wear elastic bandages or support stockings as told by your doctor.  A low-salt diet may help lessen the puffiness.  Only take medicine as told by your doctor. GET HELP IF:  Treatment is not working.  You have heart, liver, or kidney disease and notice that your skin looks puffy or shiny.  You have puffiness in your legs that does not get better when you raise your legs.  You have sudden  weight gain for no reason. GET HELP RIGHT AWAY IF:   You have shortness of breath or chest pain.  You cannot breathe when you lie down.  You have pain, redness, or warmth in the areas that are puffy.  You have heart, liver, or kidney disease and get edema all of a sudden.  You have a fever and your symptoms get worse all of a sudden. MAKE SURE YOU:   Understand these instructions.  Will watch your condition.  Will get help right away if you are not doing well or get worse.   This information is not intended to replace advice given to you by your health care provider. Make sure you discuss any questions you have with your health care provider.   Document Released: 05/23/2008 Document Revised: 12/10/2013 Document Reviewed: 09/27/2013 Elsevier Interactive Patient Education 2016 [place] DASH stands for "Dietary Approaches to Stop Hypertension." The DASH eating plan is a healthy eating plan that has been shown to reduce high blood pressure (hypertension). Additional health benefits may include reducing the risk of type 2 diabetes mellitus, heart disease, and stroke. The DASH eating plan may also help with weight loss. WHAT DO I NEED TO KNOW ABOUT THE DASH EATING PLAN? For the DASH eating plan, you will follow these general guidelines:  Choose foods with a  percent daily value for sodium of less than 5% (as listed on the food label).  Use salt-free seasonings or herbs instead of table salt or sea salt.  Check with your health care provider or pharmacist before using salt substitutes.  Eat lower-sodium products, often labeled as "lower sodium" or "no salt added."  Eat fresh foods.  Eat more vegetables, fruits, and low-fat dairy products.  Choose whole grains. Look for the word "whole" as the first word in the ingredient list.  Choose fish and skinless chicken or [place] more often than red meat. Limit fish, poultry, and meat to 6 oz (170 g) each  day.  Limit sweets, desserts, sugars, and sugary drinks.  Choose heart-healthy fats.  Limit cheese to 1 oz (28 g) per day.  Eat more home-cooked food and less restaurant, buffet, and fast food.  Limit fried foods.  Cook foods using methods other than frying.  Limit canned vegetables. If you do use them, rinse them well to decrease the sodium.  When eating at a restaurant, ask that your food be prepared with less salt, or no salt if possible. WHAT FOODS CAN I EAT? Seek help from a dietitian for individual calorie needs. Grains Whole grain or whole wheat bread. Brown rice. Whole grain or whole wheat pasta. Quinoa, bulgur, and whole grain cereals. Low-sodium cereals. Corn or whole wheat flour tortillas. Whole grain cornbread. Whole grain crackers. Low-sodium crackers. Vegetables Fresh or frozen vegetables (raw, steamed, roasted, or grilled). Low-sodium or reduced-sodium tomato and vegetable juices. Low-sodium or reduced-sodium tomato sauce and paste. Low-sodium or reduced-sodium canned vegetables.  Fruits All fresh, canned (in natural juice), or frozen fruits. Meat and Other Protein Products Ground beef (85% or leaner), grass-fed beef, or beef trimmed of fat. Skinless chicken or [place]. Ground chicken or [place]. Pork trimmed of fat. All fish and seafood. Eggs. Dried beans, peas, or lentils. Unsalted nuts and seeds. Unsalted canned beans. Dairy Low-fat dairy products, such as skim or 1% milk, 2% or reduced-fat cheeses, low-fat ricotta or cottage cheese, or plain low-fat yogurt. Low-sodium or reduced-sodium cheeses. Fats and Oils Tub margarines without trans fats. Light or reduced-fat mayonnaise and salad dressings (reduced sodium). Avocado. Safflower, olive, or canola oils. Natural peanut or almond butter. Other Unsalted popcorn and pretzels. The items listed above may not be a complete list of recommended foods or beverages. Contact your dietitian for more options. WHAT FOODS ARE NOT  RECOMMENDED? Grains White bread. White pasta. White rice. Refined cornbread. Bagels and croissants. Crackers that contain trans fat. Vegetables Creamed or fried vegetables. Vegetables in a cheese sauce. Regular canned vegetables. Regular canned tomato sauce and paste. Regular tomato and vegetable juices. Fruits Dried fruits. Canned fruit in light or heavy syrup. Fruit juice. Meat and Other Protein Products Fatty cuts of meat. Ribs, chicken wings, bacon, sausage, bologna, salami, chitterlings, fatback, hot dogs, bratwurst, and packaged luncheon meats. Salted nuts and seeds. Canned beans with salt. Dairy Whole or 2% milk, cream, half-and-half, and cream cheese. Whole-fat or sweetened yogurt. Full-fat cheeses or blue cheese. Nondairy creamers and whipped toppings. Processed cheese, cheese spreads, or cheese curds. Condiments Onion and garlic salt, seasoned salt, table salt, and sea salt. Canned and packaged gravies. Worcestershire sauce. Tartar sauce. Barbecue sauce. Teriyaki sauce. Soy sauce, including reduced sodium. Steak sauce. Fish sauce. Oyster sauce. Cocktail sauce. Horseradish. Ketchup and mustard. Meat flavorings and tenderizers. Bouillon cubes. Hot sauce. Tabasco sauce. Marinades. Taco seasonings. Relishes. Fats and Oils Butter, stick margarine, lard, shortening, ghee, and bacon fat.  Coconut, palm kernel, or palm oils. Regular salad dressings. Other Pickles and olives. Salted popcorn and pretzels. The items listed above may not be a complete list of foods and beverages to avoid. Contact your dietitian for more information. WHERE CAN I FIND MORE INFORMATION? National Heart, Lung, and Blood Institute: travelstabloid.com   This information is not intended to replace advice given to you by your health care provider. Make sure you discuss any questions you have with your health care provider.   Document Released: 11/24/2011 Document Revised: 12/26/2014  Document Reviewed: 10/09/2013 Elsevier Interactive Patient Education [employer].

## 2015-12-16 NOTE — Assessment & Plan Note (Addendum)
-  will refer to nephrology -decrease amlodipine to 5mg  qd given LE swelling  -cont other meds

## 2015-12-16 NOTE — Assessment & Plan Note (Addendum)
Pt p/w LE swelling R>L x 3 days.  States he is sometimes not compliant with xarelto.  He denies any associated pain or erythema.  It is difficult to imagine that the swelling has only been going on for 3 days given that he has chronic venous stasis changes.  He is unable to clarify if the swelling improves upon leg elevation.  He has also been on amlodipine which may be a contributing factor.  Unfortunately he has PVD which limits his ability to wear compression socks.  I have stressed the importance of not missing any doses of xarelto.  LFT were OK in 2014.  He does have CKD with significant proteinuria which is likely a contributing factor.  Albumin has not been checked recently.  Also with h/o mild systolic dysfunction with 2006 TTE LVEF: 45-50%.  Has also been on amlodipine which may be contributing as well.  DVT is also a possible given R>L LE swelling but he is already on xarelto although noncompliance is likely.   -would advise to cont xarelto, could obtain LE dopplers but would still need to be on xarelto so would not change mgmt  -will decrease amlodipine to 5mg  daily  -keep legs elevated as much as possible -return to clinic in 1 month for a recheck  -advised to also f/u with Dr. Doylene Canard (pt reports missing his previous appt)  -will check CMP today  -referral to nephrology

## 2015-12-16 NOTE — Assessment & Plan Note (Signed)
Pt reports missing xarelto occasionally.  I explained that this must be taken daily to prevent DVT.   -cont xarelto

## 2015-12-17 ENCOUNTER — Encounter: Payer: Self-pay | Admitting: Internal Medicine

## 2015-12-17 DIAGNOSIS — R600 Localized edema: Secondary | ICD-10-CM | POA: Insufficient documentation

## 2015-12-17 LAB — CMP14 + ANION GAP
A/G RATIO: 1.3 (ref 1.1–2.5)
ALT: 42 IU/L (ref 0–44)
AST: 29 IU/L (ref 0–40)
Albumin: 3.9 g/dL (ref 3.6–4.8)
Alkaline Phosphatase: 119 IU/L — ABNORMAL HIGH (ref 39–117)
Anion Gap: 17 mmol/L (ref 10.0–18.0)
BILIRUBIN TOTAL: 0.2 mg/dL (ref 0.0–1.2)
BUN/Creatinine Ratio: 11 (ref 10–22)
BUN: 30 mg/dL — AB (ref 8–27)
CO2: 20 mmol/L (ref 18–29)
CREATININE: 2.72 mg/dL — AB (ref 0.76–1.27)
Calcium: 8.7 mg/dL (ref 8.6–10.2)
Chloride: 107 mmol/L — ABNORMAL HIGH (ref 96–106)
GFR calc Af Amer: 26 mL/min/{1.73_m2} — ABNORMAL LOW (ref >59)
GFR, EST NON AFRICAN AMERICAN: 23 mL/min/{1.73_m2} — AB (ref >59)
GLUCOSE: 106 mg/dL — AB (ref 65–99)
Globulin, Total: 2.9 g/dL (ref 1.5–4.5)
POTASSIUM: 5.3 mmol/L — AB (ref 3.5–5.2)
Sodium: 144 mmol/L (ref 134–144)
Total Protein: 6.8 g/dL (ref 6.0–8.5)

## 2015-12-17 NOTE — Assessment & Plan Note (Signed)
-  see assessment and plan for venous thromboembolism

## 2015-12-17 NOTE — Assessment & Plan Note (Signed)
No complaints of leg pain today. -cont current mgmt

## 2015-12-24 NOTE — Progress Notes (Signed)
Internal Medicine Clinic Attending  Case discussed with Dr. Gill soon after the resident saw the patient.  We reviewed the resident's history and exam and pertinent patient test results.  I agree with the assessment, diagnosis, and plan of care documented in the resident's note.  

## 2015-12-24 NOTE — Addendum Note (Signed)
Addended by: Gilles Chiquito B on: 12/24/2015 10:11 AM   Modules accepted: Level of Service

## 2016-01-06 ENCOUNTER — Inpatient Hospital Stay (HOSPITAL_COMMUNITY)
Admission: AD | Admit: 2016-01-06 | Discharge: 2016-01-13 | DRG: 291 | Disposition: A | Discharge status: Home / Self Care | Payer: Commercial Managed Care - HMO | Source: Ambulatory Visit | Attending: Cardiovascular Disease | Admitting: Cardiovascular Disease

## 2016-01-06 ENCOUNTER — Inpatient Hospital Stay (HOSPITAL_COMMUNITY): Payer: Commercial Managed Care - HMO

## 2016-01-06 ENCOUNTER — Encounter (HOSPITAL_COMMUNITY): Payer: Self-pay | Admitting: Cardiovascular Disease

## 2016-01-06 DIAGNOSIS — N179 Acute kidney failure, unspecified: Secondary | ICD-10-CM | POA: Diagnosis not present

## 2016-01-06 DIAGNOSIS — Z6834 Body mass index (BMI) 34.0-34.9, adult: Secondary | ICD-10-CM

## 2016-01-06 DIAGNOSIS — Z531 Procedure and treatment not carried out because of patient's decision for reasons of belief and group pressure: Secondary | ICD-10-CM | POA: Diagnosis present

## 2016-01-06 DIAGNOSIS — K635 Polyp of colon: Secondary | ICD-10-CM | POA: Diagnosis not present

## 2016-01-06 DIAGNOSIS — Z7901 Long term (current) use of anticoagulants: Secondary | ICD-10-CM

## 2016-01-06 DIAGNOSIS — D500 Iron deficiency anemia secondary to blood loss (chronic): Secondary | ICD-10-CM | POA: Diagnosis not present

## 2016-01-06 DIAGNOSIS — D122 Benign neoplasm of ascending colon: Secondary | ICD-10-CM | POA: Diagnosis not present

## 2016-01-06 DIAGNOSIS — E785 Hyperlipidemia, unspecified: Secondary | ICD-10-CM | POA: Diagnosis not present

## 2016-01-06 DIAGNOSIS — I10 Essential (primary) hypertension: Secondary | ICD-10-CM | POA: Diagnosis not present

## 2016-01-06 DIAGNOSIS — E66811 Obesity, class 1: Secondary | ICD-10-CM | POA: Diagnosis present

## 2016-01-06 DIAGNOSIS — I509 Heart failure, unspecified: Secondary | ICD-10-CM | POA: Diagnosis not present

## 2016-01-06 DIAGNOSIS — K5730 Diverticulosis of large intestine without perforation or abscess without bleeding: Secondary | ICD-10-CM | POA: Diagnosis not present

## 2016-01-06 DIAGNOSIS — K2980 Duodenitis without bleeding: Secondary | ICD-10-CM | POA: Diagnosis present

## 2016-01-06 DIAGNOSIS — Z86711 Personal history of pulmonary embolism: Secondary | ICD-10-CM | POA: Diagnosis not present

## 2016-01-06 DIAGNOSIS — F1010 Alcohol abuse, uncomplicated: Secondary | ICD-10-CM | POA: Diagnosis not present

## 2016-01-06 DIAGNOSIS — Z79899 Other long term (current) drug therapy: Secondary | ICD-10-CM

## 2016-01-06 DIAGNOSIS — Z833 Family history of diabetes mellitus: Secondary | ICD-10-CM

## 2016-01-06 DIAGNOSIS — D649 Anemia, unspecified: Secondary | ICD-10-CM | POA: Diagnosis present

## 2016-01-06 DIAGNOSIS — F17290 Nicotine dependence, other tobacco product, uncomplicated: Secondary | ICD-10-CM | POA: Diagnosis not present

## 2016-01-06 DIAGNOSIS — K921 Melena: Secondary | ICD-10-CM | POA: Diagnosis not present

## 2016-01-06 DIAGNOSIS — D124 Benign neoplasm of descending colon: Secondary | ICD-10-CM | POA: Diagnosis present

## 2016-01-06 DIAGNOSIS — I4891 Unspecified atrial fibrillation: Secondary | ICD-10-CM | POA: Diagnosis present

## 2016-01-06 DIAGNOSIS — N189 Chronic kidney disease, unspecified: Secondary | ICD-10-CM | POA: Diagnosis not present

## 2016-01-06 DIAGNOSIS — Z961 Presence of intraocular lens: Secondary | ICD-10-CM | POA: Diagnosis present

## 2016-01-06 DIAGNOSIS — Z86718 Personal history of other venous thrombosis and embolism: Secondary | ICD-10-CM

## 2016-01-06 DIAGNOSIS — I5021 Acute systolic (congestive) heart failure: Secondary | ICD-10-CM | POA: Diagnosis present

## 2016-01-06 DIAGNOSIS — Z888 Allergy status to other drugs, medicaments and biological substances status: Secondary | ICD-10-CM | POA: Diagnosis not present

## 2016-01-06 DIAGNOSIS — I252 Old myocardial infarction: Secondary | ICD-10-CM

## 2016-01-06 DIAGNOSIS — E669 Obesity, unspecified: Secondary | ICD-10-CM | POA: Diagnosis present

## 2016-01-06 DIAGNOSIS — F17210 Nicotine dependence, cigarettes, uncomplicated: Secondary | ICD-10-CM | POA: Diagnosis present

## 2016-01-06 DIAGNOSIS — R0602 Shortness of breath: Secondary | ICD-10-CM | POA: Diagnosis present

## 2016-01-06 DIAGNOSIS — I739 Peripheral vascular disease, unspecified: Secondary | ICD-10-CM | POA: Diagnosis not present

## 2016-01-06 DIAGNOSIS — F329 Major depressive disorder, single episode, unspecified: Secondary | ICD-10-CM | POA: Diagnosis present

## 2016-01-06 DIAGNOSIS — Z7982 Long term (current) use of aspirin: Secondary | ICD-10-CM | POA: Diagnosis not present

## 2016-01-06 DIAGNOSIS — E119 Type 2 diabetes mellitus without complications: Secondary | ICD-10-CM | POA: Diagnosis not present

## 2016-01-06 DIAGNOSIS — N529 Male erectile dysfunction, unspecified: Secondary | ICD-10-CM | POA: Diagnosis present

## 2016-01-06 DIAGNOSIS — E1122 Type 2 diabetes mellitus with diabetic chronic kidney disease: Secondary | ICD-10-CM | POA: Diagnosis not present

## 2016-01-06 DIAGNOSIS — I255 Ischemic cardiomyopathy: Secondary | ICD-10-CM | POA: Diagnosis present

## 2016-01-06 DIAGNOSIS — Z9842 Cataract extraction status, left eye: Secondary | ICD-10-CM | POA: Diagnosis not present

## 2016-01-06 DIAGNOSIS — Z9841 Cataract extraction status, right eye: Secondary | ICD-10-CM

## 2016-01-06 DIAGNOSIS — N170 Acute kidney failure with tubular necrosis: Secondary | ICD-10-CM | POA: Diagnosis not present

## 2016-01-06 DIAGNOSIS — D509 Iron deficiency anemia, unspecified: Secondary | ICD-10-CM | POA: Diagnosis not present

## 2016-01-06 DIAGNOSIS — N184 Chronic kidney disease, stage 4 (severe): Secondary | ICD-10-CM | POA: Diagnosis present

## 2016-01-06 DIAGNOSIS — K922 Gastrointestinal hemorrhage, unspecified: Secondary | ICD-10-CM | POA: Diagnosis not present

## 2016-01-06 DIAGNOSIS — I129 Hypertensive chronic kidney disease with stage 1 through stage 4 chronic kidney disease, or unspecified chronic kidney disease: Secondary | ICD-10-CM | POA: Diagnosis not present

## 2016-01-06 DIAGNOSIS — E1169 Type 2 diabetes mellitus with other specified complication: Secondary | ICD-10-CM | POA: Diagnosis not present

## 2016-01-06 DIAGNOSIS — I130 Hypertensive heart and chronic kidney disease with heart failure and stage 1 through stage 4 chronic kidney disease, or unspecified chronic kidney disease: Principal | ICD-10-CM | POA: Diagnosis present

## 2016-01-06 DIAGNOSIS — I2510 Atherosclerotic heart disease of native coronary artery without angina pectoris: Secondary | ICD-10-CM | POA: Diagnosis not present

## 2016-01-06 DIAGNOSIS — E1151 Type 2 diabetes mellitus with diabetic peripheral angiopathy without gangrene: Secondary | ICD-10-CM | POA: Diagnosis not present

## 2016-01-06 DIAGNOSIS — Z951 Presence of aortocoronary bypass graft: Secondary | ICD-10-CM | POA: Diagnosis not present

## 2016-01-06 DIAGNOSIS — R600 Localized edema: Secondary | ICD-10-CM | POA: Diagnosis present

## 2016-01-06 HISTORY — DX: Acute systolic (congestive) heart failure: I50.21

## 2016-01-06 HISTORY — DX: Acute embolism and thrombosis of unspecified deep veins of unspecified lower extremity: I82.409

## 2016-01-06 LAB — CBC WITH DIFFERENTIAL/PLATELET
BASOS ABS: 0 10*3/uL (ref 0.0–0.1)
BASOS PCT: 0 %
EOS ABS: 0.1 10*3/uL (ref 0.0–0.7)
Eosinophils Relative: 1 %
HEMATOCRIT: 20.6 % — AB (ref 39.0–52.0)
HEMOGLOBIN: 6.6 g/dL — AB (ref 13.0–17.0)
Lymphocytes Relative: 32 %
Lymphs Abs: 1.6 10*3/uL (ref 0.7–4.0)
MCH: 27.6 pg (ref 26.0–34.0)
MCHC: 32 g/dL (ref 30.0–36.0)
MCV: 86.2 fL (ref 78.0–100.0)
Monocytes Absolute: 0.4 10*3/uL (ref 0.1–1.0)
Monocytes Relative: 8 %
NEUTROS ABS: 2.9 10*3/uL (ref 1.7–7.7)
NEUTROS PCT: 59 %
Platelets: 161 10*3/uL (ref 150–400)
RBC: 2.39 MIL/uL — ABNORMAL LOW (ref 4.22–5.81)
RDW: 16.1 % — ABNORMAL HIGH (ref 11.5–15.5)
WBC: 5 10*3/uL (ref 4.0–10.5)

## 2016-01-06 LAB — COMPREHENSIVE METABOLIC PANEL
ALBUMIN: 3.4 g/dL — AB (ref 3.5–5.0)
ALK PHOS: 105 U/L (ref 38–126)
ALT: 33 U/L (ref 17–63)
ANION GAP: 11 (ref 5–15)
AST: 27 U/L (ref 15–41)
BILIRUBIN TOTAL: 0.4 mg/dL (ref 0.3–1.2)
BUN: 28 mg/dL — AB (ref 6–20)
CALCIUM: 8.9 mg/dL (ref 8.9–10.3)
CO2: 21 mmol/L — AB (ref 22–32)
Chloride: 110 mmol/L (ref 101–111)
Creatinine, Ser: 2.66 mg/dL — ABNORMAL HIGH (ref 0.61–1.24)
GFR calc Af Amer: 27 mL/min — ABNORMAL LOW (ref >60)
GFR calc non Af Amer: 23 mL/min — ABNORMAL LOW (ref >60)
GLUCOSE: 107 mg/dL — AB (ref 65–99)
POTASSIUM: 4.4 mmol/L (ref 3.5–5.1)
SODIUM: 142 mmol/L (ref 135–145)
TOTAL PROTEIN: 7 g/dL (ref 6.5–8.1)

## 2016-01-06 LAB — TROPONIN I
TROPONIN I: 0.04 ng/mL — AB (ref <0.031)
Troponin I: 0.03 ng/mL (ref <0.031)

## 2016-01-06 LAB — GLUCOSE, CAPILLARY
GLUCOSE-CAPILLARY: 166 mg/dL — AB (ref 65–99)
Glucose-Capillary: 91 mg/dL (ref 65–99)

## 2016-01-06 LAB — BRAIN NATRIURETIC PEPTIDE: B Natriuretic Peptide: 1502.8 pg/mL — ABNORMAL HIGH (ref 0.0–100.0)

## 2016-01-06 LAB — TSH: TSH: 1.179 u[IU]/mL (ref 0.350–4.500)

## 2016-01-06 MED ORDER — FUROSEMIDE 10 MG/ML IJ SOLN
80.0000 mg | Freq: Two times a day (BID) | INTRAMUSCULAR | Status: DC
  Administered 2016-01-06 – 2016-01-07 (×2): 80 mg via INTRAVENOUS
  Filled 2016-01-06 (×2): qty 8

## 2016-01-06 MED ORDER — AMLODIPINE BESYLATE 5 MG PO TABS
5.0000 mg | ORAL_TABLET | Freq: Every day | ORAL | Status: DC
  Administered 2016-01-06 – 2016-01-09 (×4): 5 mg via ORAL
  Filled 2016-01-06 (×4): qty 1

## 2016-01-06 MED ORDER — ACETAMINOPHEN 325 MG PO TABS: 650.0000 mg | ORAL_TABLET | ORAL | Status: DC | PRN

## 2016-01-06 MED ORDER — ONDANSETRON HCL 4 MG/2ML IJ SOLN: 4.0000 mg | Freq: Four times a day (QID) | INTRAMUSCULAR | Status: DC | PRN

## 2016-01-06 MED ORDER — TERAZOSIN HCL 5 MG PO CAPS: 5.0000 mg | ORAL_CAPSULE | Freq: Every day | ORAL | Status: DC

## 2016-01-06 MED ORDER — GUAIFENESIN-DM 100-10 MG/5ML PO SYRP
5.0000 mL | ORAL_SOLUTION | ORAL | Status: DC | PRN
  Administered 2016-01-06 – 2016-01-11 (×5): 5 mL via ORAL
  Filled 2016-01-06 (×5): qty 5

## 2016-01-06 MED ORDER — ATORVASTATIN CALCIUM 20 MG PO TABS
20.0000 mg | ORAL_TABLET | Freq: Every day | ORAL | Status: DC
  Administered 2016-01-06 – 2016-01-12 (×7): 20 mg via ORAL
  Filled 2016-01-06 (×7): qty 1

## 2016-01-06 MED ORDER — SODIUM CHLORIDE 0.9 % IJ SOLN
3.0000 mL | Freq: Two times a day (BID) | INTRAMUSCULAR | Status: DC
  Administered 2016-01-06 – 2016-01-13 (×13): 3 mL via INTRAVENOUS

## 2016-01-06 MED ORDER — METOPROLOL SUCCINATE ER 100 MG PO TB24
100.0000 mg | ORAL_TABLET | Freq: Every day | ORAL | Status: DC
  Administered 2016-01-06 – 2016-01-13 (×8): 100 mg via ORAL
  Filled 2016-01-06 (×8): qty 1

## 2016-01-06 MED ORDER — RIVAROXABAN 20 MG PO TABS
20.0000 mg | ORAL_TABLET | Freq: Every day | ORAL | Status: DC
  Administered 2016-01-06: 20 mg via ORAL
  Filled 2016-01-06: qty 1

## 2016-01-06 MED ORDER — SODIUM CHLORIDE 0.9 % IV SOLN
250.0000 mL | INTRAVENOUS | Status: DC | PRN
  Administered 2016-01-08: 14:00:00 via INTRAVENOUS

## 2016-01-06 MED ORDER — SODIUM CHLORIDE 0.9 % IJ SOLN: 3.0000 mL | INTRAMUSCULAR | Status: DC | PRN

## 2016-01-06 MED ORDER — HEPARIN SODIUM (PORCINE) 5000 UNIT/ML IJ SOLN: 5000.0000 [IU] | Freq: Three times a day (TID) | INTRAMUSCULAR | Status: DC

## 2016-01-06 MED ORDER — TERAZOSIN HCL 5 MG PO CAPS
5.0000 mg | ORAL_CAPSULE | Freq: Every day | ORAL | Status: DC
Start: 2016-01-06 — End: 2016-01-13
  Administered 2016-01-06 – 2016-01-12 (×7): 5 mg via ORAL
  Filled 2016-01-06 (×12): qty 1

## 2016-01-06 NOTE — Progress Notes (Signed)
Pt arrived to floor.  Oriented to room.  Call bell at reach.  Verbalized understanding of care.  Dr Doylene Canard made aware of pt arrival to floor.  Will continue to monitor.  Karie Kirks, Therapist, sports.

## 2016-01-06 NOTE — Progress Notes (Signed)
Notified Dr. Doylene Canard of lab results for CBC and BMP. Also notified doctor of patient being a diabetic to see if SSI is needed. [id number]-[id number]. Doctor will put in orders to address the above. Will continue to monitor patient to end of shift.

## 2016-01-06 NOTE — Progress Notes (Signed)
HGB 6.6 called in by Red Christians from lab.  Notified Dr. Doylene Canard.  No new orders.  Will continue to monitor.  Karie Kirks, Therapist, sports.

## 2016-01-06 NOTE — H&P (Signed)
Referring Physician:  [name] is an 70 y.o. male.                       Chief Complaint: Shortness of breath and leg edema  HPI: 70 year old male with past history of type 2 DM, CKD, III, hypertension, Hyperlipidemia, tobacco use disorder, venous thromboembolism, CAD, CABG, PVD, alcohol use disorder and obesity has 4 week history of leg edema and now for 2 weeks he is short of breath without chest pain, cough or fever.  Past Medical History  Diagnosis Date  . Type 2 diabetes mellitus with stage 3 chronic kidney disease ([place]) 02/15/2007  . Chronic kidney disease, stage 3 11/12/2013  . Erectile dysfunction associated with type 2 diabetes mellitus ([place]) 02/15/2007  . Essential hypertension 02/15/2007  . Hyperlipidemia 02/15/2007  . Tobacco abuse 02/15/2007  . Obesity (BMI 30.0-34.9) 02/15/2007  . Coronary artery disease 02/15/2007    Non-STEMI 07/2005, s/p CABG x 4 (08/01/2005): LIMA to LAD, SVG to OM, RCA, PCA   . Peripheral vascular occlusive disease ([place]) 12/30/2013    ABI (01/03/2014): Right 0.64, Left 0.64   . Venous thromboembolism 11/11/2013    LE Dopplers (01/2011): RLE DVT. CTA with mild subacute to chronic right-sided pulmonary emboli.  Patient has elected to continue anticoagulation.   . Alcohol abuse 02/15/2007    Reports consuming 1 pint of gin on weekends, does not drink every day. Denies history of seizures, blackouts, or tremors.    . Anemia 11/12/2013    Unclear cause as of yet (? EtOH abuse)   . Benign prostatic hypertrophy 06/02/2014  . Allergic rhinitis 04/09/2014  . Adenoma of left adrenal gland 06/16/2014    CT Abdomen (06/04/2014): Incidental, 20 mm, 8.33 HU suggesting benign adenoma   . Major depression, single episode 11/02/2015  . Acute left systolic heart failure ([place]) 01/06/2016      Past Surgical History  Procedure Laterality Date  . Coronary artery bypass graft  08/01/2005    x 4, LIMA to LAD, SVG to OM, RCA, PCA  . Cataract extraction, bilateral Bilateral      Left 04/30/2014, Right 05/07/2014    Family History  Problem Relation Age of Onset  . Heart disease Father   . COPD Father   . Diabetes Mellitus II Mother   . Healthy Sister   . Healthy Brother   . Healthy Daughter   . Healthy Son   . Healthy Sister   . Healthy Sister   . Healthy Sister   . Healthy Sister   . Healthy Daughter   . Healthy Son   . Healthy Son   . Healthy Son   . Healthy Son   . Healthy Son   . Healthy Son    Social History:  reports that he has been smoking Cigarettes.  He has a 12.5 pack-year smoking history. He has never used smokeless tobacco. He reports that he drinks alcohol. He reports that he does not use illicit drugs.  Allergies:  Allergies  Allergen Reactions  . Nicotine Transdermal System [Nicotine] Other (See Comments)    Vivid dreams    Medications Prior to Admission  Medication Sig Dispense Refill  . amLODipine (NORVASC) 5 MG tablet Take 1 tablet (5 mg total) by mouth daily. 30 tablet 1  . aspirin 81 MG tablet Take 81 mg by mouth daily.    Marland Kitchen atorvastatin (LIPITOR) 20 MG tablet Take 1 tablet (20 mg total) by mouth daily. 90 tablet  3  . lisinopril (PRINIVIL,ZESTRIL) 40 MG tablet Take 1 tablet (40 mg total) by mouth daily. 90 tablet 3  . metoprolol succinate (TOPROL-XL) 100 MG 24 hr tablet Take 1 tablet (100 mg total) by mouth daily. 90 tablet 3  . rivaroxaban (XARELTO) 20 MG TABS tablet Take 1 tablet (20 mg total) by mouth daily with supper. 30 tablet 11  . sildenafil (REVATIO) 20 MG tablet Take 5 tablets (100 mg total) by mouth daily as needed. 30 tablet 5  . terazosin (HYTRIN) 5 MG capsule Take 1 capsule (5 mg total) by mouth at bedtime. 90 capsule 3  . buPROPion (WELLBUTRIN) 100 MG tablet Take 1 tablet (100 mg total) by mouth 2 (two) times daily. 180 tablet 3    Results for orders placed or performed during the hospital encounter of 01/06/16 (from the past 48 hour(s))  Glucose, capillary     Status: None   Collection Time: 01/06/16  4:22 PM   Result Value Ref Range   Glucose-Capillary 91 65 - 99 mg/dL   Comment 1 Notify RN    No results found.  Review Of Systems GENERAL: He denies any fatigue, weakness, fevers, or chills.   CARDIOVASCULAR: + Leg edema, shortness of breath. RESPIRATORY: He denies any sputum production, hemoptysis, or wheezing.+ Cough.  GI: He has some nausea, but no abdominal pain, vomiting, diarrhea, or constipation.  GU: He denies any dysuria, frequency, or hematuria.  MUSCULOSKELETAL: He denies any joint pains or joint swelling. CNS: He denies any dizziness, blurring of vision, weakness of extremities, or slurring of speech.  Blood pressure 188/69, pulse 86, temperature 97.9 F (36.6 C), temperature source Oral, resp. rate 20, height 5\' 7"  (1.702 m), weight 100.2 kg (220 lb 14.4 oz), SpO2 100 %. Physical Exam: Gen: Well-developed and over-nourished male in mild respiratory distress. HEENT: Brown, prominent eyes. Conj-pink, Sclera-non-icteric. Oropharynx clear without erythema or exudate.  Neck: No cervical LAD, no thyromegaly or nodules, ++ JVD noted. CV: Normal rate, regular rhythm, III/VI systolic murmurs, no rubs, or gallops Pulmonary: Bibasilar crackles. Abdominal: Soft, non-tender, non-distended, without rebound, guarding, or masses Extremities: 4 + bilateral lower leg edema. Distal pulses 2+ in upper and lower extremities bilaterally. No erythema. Neuro: CN II-XII grossly intact, moves all 4 extremities. Romberg negative. Skin: Warm and dry. No rashes  Assessment/Plan Acute left heart systolic failure Type 2 DM,  CKD, III,  Hypertension,  Hyperlipidemia,  Tobacco use disorder,  H/O venous thromboembolism,  CAD,  CABG,  PVD,  Alcohol use disorder Obesity   Admit/IV lasix/Ecocardiogram/Blood work  Birdie Riddle, MD  01/06/2016, 4:39 PM

## 2016-01-07 ENCOUNTER — Inpatient Hospital Stay (HOSPITAL_COMMUNITY): Payer: Commercial Managed Care - HMO

## 2016-01-07 LAB — PREPARE RBC (CROSSMATCH)

## 2016-01-07 LAB — BASIC METABOLIC PANEL
ANION GAP: 9 (ref 5–15)
BUN: 29 mg/dL — AB (ref 6–20)
CALCIUM: 9 mg/dL (ref 8.9–10.3)
CO2: 24 mmol/L (ref 22–32)
Chloride: 108 mmol/L (ref 101–111)
Creatinine, Ser: 2.85 mg/dL — ABNORMAL HIGH (ref 0.61–1.24)
GFR calc Af Amer: 24 mL/min — ABNORMAL LOW (ref >60)
GFR calc non Af Amer: 21 mL/min — ABNORMAL LOW (ref >60)
GLUCOSE: 136 mg/dL — AB (ref 65–99)
Potassium: 4.3 mmol/L (ref 3.5–5.1)
SODIUM: 141 mmol/L (ref 135–145)

## 2016-01-07 LAB — GLUCOSE, CAPILLARY
GLUCOSE-CAPILLARY: 146 mg/dL — AB (ref 65–99)
Glucose-Capillary: 108 mg/dL — ABNORMAL HIGH (ref 65–99)
Glucose-Capillary: 135 mg/dL — ABNORMAL HIGH (ref 65–99)
Glucose-Capillary: 148 mg/dL — ABNORMAL HIGH (ref 65–99)

## 2016-01-07 LAB — FERRITIN: FERRITIN: 5 ng/mL — AB (ref 24–336)

## 2016-01-07 LAB — TYPE AND SCREEN
ABO/RH(D): O POS
Antibody Screen: NEGATIVE
Unit division: 0

## 2016-01-07 LAB — ABO/RH: ABO/RH(D): O POS

## 2016-01-07 LAB — RETICULOCYTES
RBC.: 2.2 MIL/uL — AB (ref 4.22–5.81)
RETIC CT PCT: 2.2 % (ref 0.4–3.1)
Retic Count, Absolute: 48.4 10*3/uL (ref 19.0–186.0)

## 2016-01-07 LAB — IRON AND TIBC
Iron: 18 ug/dL — ABNORMAL LOW (ref 45–182)
SATURATION RATIOS: 4 % — AB (ref 17.9–39.5)
TIBC: 423 ug/dL (ref 250–450)
UIBC: 405 ug/dL

## 2016-01-07 LAB — TROPONIN I: Troponin I: 0.04 ng/mL — ABNORMAL HIGH (ref <0.031)

## 2016-01-07 LAB — VITAMIN B12: Vitamin B-12: 684 pg/mL (ref 180–914)

## 2016-01-07 LAB — NO BLOOD PRODUCTS

## 2016-01-07 LAB — FOLATE: Folate: 14.4 ng/mL (ref >5.9)

## 2016-01-07 MED ORDER — INSULIN ASPART 100 UNIT/ML ~~LOC~~ SOLN
0.0000 [IU] | Freq: Three times a day (TID) | SUBCUTANEOUS | Status: DC
  Administered 2016-01-08: 1 [IU] via SUBCUTANEOUS
  Administered 2016-01-08: 2 [IU] via SUBCUTANEOUS
  Administered 2016-01-09 – 2016-01-13 (×9): 1 [IU] via SUBCUTANEOUS

## 2016-01-07 MED ORDER — PANTOPRAZOLE SODIUM 40 MG IV SOLR
40.0000 mg | INTRAVENOUS | Status: DC
  Administered 2016-01-07 – 2016-01-12 (×6): 40 mg via INTRAVENOUS
  Filled 2016-01-07 (×6): qty 40

## 2016-01-07 MED ORDER — SODIUM CHLORIDE 0.9 % IV SOLN
Freq: Once | INTRAVENOUS | Status: AC
  Administered 2016-01-08: 1000 mL via INTRAVENOUS

## 2016-01-07 MED ORDER — FE FUMARATE-B12-VIT C-FA-IFC PO CAPS
1.0000 | ORAL_CAPSULE | Freq: Three times a day (TID) | ORAL | Status: DC
  Administered 2016-01-07 – 2016-01-13 (×16): 1 via ORAL
  Filled 2016-01-07 (×21): qty 1

## 2016-01-07 MED ORDER — FUROSEMIDE 80 MG PO TABS
80.0000 mg | ORAL_TABLET | Freq: Two times a day (BID) | ORAL | Status: DC
  Administered 2016-01-07 – 2016-01-13 (×12): 80 mg via ORAL
  Filled 2016-01-07 (×12): qty 1

## 2016-01-07 MED ORDER — RIVAROXABAN 15 MG PO TABS: 15.0000 mg | ORAL_TABLET | Freq: Every day | ORAL | Status: DC

## 2016-01-07 NOTE — Care Management Note (Signed)
Case Management Note  Patient Details  Name: Laden Presswood MRN: [id number] Date of Birth: 01/13/1946  Subjective/Objective:                    Action/Plan: Patient was admitted with CHF. Lives at home with spouse. Will follow for discharge needs.  Expected Discharge Date:                  Expected Discharge Plan:     In-House Referral:     Discharge planning Services  CM Consult  Post Acute Care Choice:    Choice offered to:     DME Arranged:    DME Agency:     HH Arranged:    HH Agency:     Status of Service:  In process, will continue to follow  Medicare Important Message Given:    Date Medicare IM Given:    Medicare IM give by:    Date Additional Medicare IM Given:    Additional Medicare Important Message give by:     If discussed at [employer] of Stay Meetings, dates discussed:    Additional Comments:  Rolm Baptise, RN 01/07/2016, 9:42 AM [phone]

## 2016-01-07 NOTE — Progress Notes (Signed)
Spoke with Dr. Doylene Canard regarding pt refusal of blood products. Order for transfusion discontinued.

## 2016-01-07 NOTE — Progress Notes (Signed)
Ref: Karren Cobble, MD   Subjective:  Last night's events noted around mid-night. 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 Feels 25 % better post diuresis. T max 99 degree F. Echocardiogram showed dilated LV with moderate LV systolic dysfunction, dilated LA and RA and moderate MR and TR.  Objective:  Vital Signs in the last 24 hours: Temp:  [97.6 F (36.4 C)-99 F (37.2 C)] 98.1 F (36.7 C) (01/19 1118) Pulse Rate:  [63-80] 63 (01/19 1118) Cardiac Rhythm:  [-] Normal sinus rhythm (01/19 1900) Resp:  [17-18] 18 (01/19 1118) BP: (134-159)/(50-86) 135/69 mmHg (01/19 1118) SpO2:  [97 %-100 %] 100 % (01/19 1118) Weight:  [96.525 kg (212 lb 12.8 oz)] 96.525 kg (212 lb 12.8 oz) (01/19 [id number])  Physical Exam: BP Readings from Last 1 Encounters:  01/07/16 135/69    Wt Readings from Last 1 Encounters:  01/07/16 96.525 kg (212 lb 12.8 oz)    Weight change:   HEENT: [place]/AT, Eyes-Brown, PERL, EOMI, Conjunctiva-Pale, Sclera-Non-icteric Neck: + JVD, No bruit, Trachea midline. Lungs:  Clear, Bilateral. Cardiac:  Regular rhythm, normal S1 and S2, no S3. III/VI systolic murmur. Abdomen:  Soft, non-tender. Extremities:  3 + edema present. No cyanosis. No clubbing. CNS: AxOx3, Cranial nerves grossly intact, moves all 4 extremities. Right handed. Skin: Warm and dry.   Intake/Output from previous day: 01/18 0701 - 01/19 0700 In: 723 [P.O.:720; I.V.:3] Out: 2000 [Urine:2000]    Lab Results: BMET    Component Value Date/Time   NA 141 01/07/2016 0730   NA 142 01/06/2016 1718   NA 144 12/16/2015 1635   NA 143 11/02/2015 1025   NA 143 08/19/2015 1233   NA 140 05/28/2014 1046   K 4.3 01/07/2016 0730   K 4.4 01/06/2016 1718   K 5.3* 12/16/2015 1635   CL 108 01/07/2016 0730   CL 110 01/06/2016 1718   CL 107* 12/16/2015 1635   CO2 24 01/07/2016 0730   CO2 21* 01/06/2016 1718   CO2 20 12/16/2015 1635   GLUCOSE 136* 01/07/2016 0730   GLUCOSE 107* 01/06/2016 1718   GLUCOSE  106* 12/16/2015 1635   GLUCOSE 131* 11/02/2015 1025   GLUCOSE 101* 08/19/2015 1233   GLUCOSE 185* 05/28/2014 1046   BUN 29* 01/07/2016 0730   BUN 28* 01/06/2016 1718   BUN 30* 12/16/2015 1635   BUN 33* 11/02/2015 1025   BUN 29* 08/19/2015 1233   BUN 18 05/28/2014 1046   CREATININE 2.85* 01/07/2016 0730   CREATININE 2.66* 01/06/2016 1718   CREATININE 2.72* 12/16/2015 1635   CREATININE 1.54* 05/28/2014 1046   CREATININE 1.43* 03/05/2014 1532   CREATININE 1.47* 12/30/2013 1532   CALCIUM 9.0 01/07/2016 0730   CALCIUM 8.9 01/06/2016 1718   CALCIUM 8.7 12/16/2015 1635   GFRNONAA 21* 01/07/2016 0730   GFRNONAA 23* 01/06/2016 1718   GFRNONAA 23* 12/16/2015 1635   GFRNONAA 46* 05/28/2014 1046   GFRNONAA 50* 03/05/2014 1532   GFRNONAA 55* 11/18/2013 1101   GFRAA 24* 01/07/2016 0730   GFRAA 27* 01/06/2016 1718   GFRAA 26* 12/16/2015 1635   GFRAA 53* 05/28/2014 1046   GFRAA 58* 03/05/2014 1532   GFRAA 64 11/18/2013 1101   CBC    Component Value Date/Time   WBC 5.0 01/06/2016 1718   WBC 4.3 08/19/2015 1233   RBC 2.20* 01/07/2016 0120   RBC 2.39* 01/06/2016 1718   RBC 3.70* 08/19/2015 1233   HGB 6.6* 01/06/2016 1718   HCT 20.6* 01/06/2016 1718  HCT 32.8* 08/19/2015 1233   PLT 161 01/06/2016 1718   PLT 180 08/19/2015 1233   MCV 86.2 01/06/2016 1718   MCV 89 08/19/2015 1233   MCH 27.6 01/06/2016 1718   MCH 28.6 08/19/2015 1233   MCHC 32.0 01/06/2016 1718   MCHC 32.3 08/19/2015 1233   RDW 16.1* 01/06/2016 1718   RDW 18.1* 08/19/2015 1233   LYMPHSABS 1.6 01/06/2016 1718   MONOABS 0.4 01/06/2016 1718   EOSABS 0.1 01/06/2016 1718   BASOSABS 0.0 01/06/2016 1718   HEPATIC Function Panel  Recent Labs  12/16/15 1635 01/06/16 1718  PROT 6.8 7.0   HEMOGLOBIN A1C No components found for: HGA1C,  MPG CARDIAC ENZYMES Lab Results  Component Value Date   CKTOTAL 64 05/26/2009   CKMB 0.7 05/26/2009   TROPONINI 0.04* 01/07/2016   TROPONINI 0.04* 01/06/2016   TROPONINI 0.03  01/06/2016   BNP No results for input(s): PROBNP in the last 8760 hours. TSH  Recent Labs  01/06/16 1718  TSH 1.179   CHOLESTEROL No results for input(s): CHOL in the last 8760 hours.  Scheduled Meds: . sodium chloride   Intravenous Once  . amLODipine  5 mg Oral Daily  . atorvastatin  20 mg Oral q1800  . ferrous Q000111Q C-folic acid  1 capsule Oral TID PC  . furosemide  80 mg Oral BID  . insulin aspart  0-9 Units Subcutaneous TID WC  . metoprolol succinate  100 mg Oral Daily  . pantoprazole (PROTONIX) IV  40 mg Intravenous Q24H  . sodium chloride  3 mL Intravenous Q12H  . terazosin  5 mg Oral QHS   Continuous Infusions:  PRN Meds:.sodium chloride, acetaminophen, guaiFENesin-dextromethorphan, ondansetron (ZOFRAN) IV, sodium chloride  Assessment/Plan: Acute left heart systolic failure Acute on chronic GI bleed Iron deficiency anemia Type 2 DM,  CKD, IV,  Hypertension,  Hyperlipidemia,  Tobacco use disorder,  H/O venous thromboembolism,  CAD,  CABG,  PVD,  Alcohol use disorder Obesity   Change to PO lasix. Patient understood risk of not taking PRBC transfusion. Start iron supplement Start IV pantoprazole.   LOS: 1 day    Dixie Dials  MD  01/07/2016, 7:40 PM

## 2016-01-07 NOTE — Progress Notes (Signed)
  Echocardiogram 2D Echocardiogram has been performed.  Kora Groom 01/07/2016, 1:04 PM

## 2016-01-07 NOTE — Care Management Note (Addendum)
Case Management Note  Patient Details  Name: Kevin Castillo MRN: [id number] Date of Birth: Jun 18, 1946  Subjective/Objective:   CHF,  anemia             Action/Plan: NCM spoke to pt and wife, Kevin Castillo # [phone] at bedside. Pt states he can afford his medications. States his Xarelto copay is $ 40 per month and he has attempted to get medication through the manufacture for a discounted rate but did not qualify for the assistance program. He states he is able to ambulate without DME at home. NCM will continue to follow for any dc needs.    Expected Discharge Date:  01/08/2016               Expected Discharge Plan:  Home/Self Care  In-House Referral:  NA  Discharge planning Services  CM Consult  Post Acute Care Choice:  NA Choice offered to:  NA  DME Arranged:  N/A DME Agency:  NA  HH Arranged:  NA HH Agency:  NA  Status of Service:  Completed, signed off  Medicare Important Message Given:    Date Medicare IM Given:    Medicare IM give by:    Date Additional Medicare IM Given:    Additional Medicare Important Message give by:     If discussed at [place] of Stay Meetings, dates discussed:    Additional Comments:  Erenest Rasher, RN 01/07/2016, 10:03 AM

## 2016-01-07 NOTE — Progress Notes (Signed)
Blood product refusal form signed by patient tonight and witnessed by this RN. Form faxed to blood bank for records. Will continue to monitor.

## 2016-01-07 NOTE — Progress Notes (Signed)
New orders for pt to receive blood, RN spoke with patient and patient states he is a Jehovah's witness and does not take blood products. Dr. Doylene Canard notified. No new orders at this time. Ronnette Hila, RN

## 2016-01-07 NOTE — Progress Notes (Signed)
Pt states he will receive plasma, but no other blood products. Dr. Doylene Canard notified.

## 2016-01-07 NOTE — Progress Notes (Addendum)
Patient refused bed alarm.    

## 2016-01-08 ENCOUNTER — Other Ambulatory Visit: Payer: Self-pay

## 2016-01-08 ENCOUNTER — Inpatient Hospital Stay (HOSPITAL_COMMUNITY): Payer: Commercial Managed Care - HMO | Admitting: Certified Registered"

## 2016-01-08 ENCOUNTER — Encounter (HOSPITAL_COMMUNITY)
Admission: AD | Disposition: A | Discharge status: Home / Self Care | Payer: Self-pay | Source: Ambulatory Visit | Attending: Cardiovascular Disease

## 2016-01-08 ENCOUNTER — Encounter (HOSPITAL_COMMUNITY): Payer: Self-pay | Admitting: *Deleted

## 2016-01-08 HISTORY — PX: ESOPHAGOGASTRODUODENOSCOPY: SHX5428

## 2016-01-08 LAB — BASIC METABOLIC PANEL
Anion gap: 11 (ref 5–15)
BUN: 33 mg/dL — AB (ref 6–20)
CO2: 23 mmol/L (ref 22–32)
CREATININE: 3.01 mg/dL — AB (ref 0.61–1.24)
Calcium: 8.7 mg/dL — ABNORMAL LOW (ref 8.9–10.3)
Chloride: 106 mmol/L (ref 101–111)
GFR, EST AFRICAN AMERICAN: 23 mL/min — AB (ref >60)
GFR, EST NON AFRICAN AMERICAN: 20 mL/min — AB (ref >60)
Glucose, Bld: 120 mg/dL — ABNORMAL HIGH (ref 65–99)
POTASSIUM: 4.2 mmol/L (ref 3.5–5.1)
SODIUM: 140 mmol/L (ref 135–145)

## 2016-01-08 LAB — CBC
HCT: 19.2 % — ABNORMAL LOW (ref 39.0–52.0)
Hemoglobin: 6 g/dL — CL (ref 13.0–17.0)
MCH: 26.1 pg (ref 26.0–34.0)
MCHC: 31.3 g/dL (ref 30.0–36.0)
MCV: 83.5 fL (ref 78.0–100.0)
PLATELETS: 149 10*3/uL — AB (ref 150–400)
RBC: 2.3 MIL/uL — AB (ref 4.22–5.81)
RDW: 15.9 % — ABNORMAL HIGH (ref 11.5–15.5)
WBC: 3.9 10*3/uL — ABNORMAL LOW (ref 4.0–10.5)

## 2016-01-08 LAB — GLUCOSE, CAPILLARY
GLUCOSE-CAPILLARY: 148 mg/dL — AB (ref 65–99)
GLUCOSE-CAPILLARY: 148 mg/dL — AB (ref 65–99)
Glucose-Capillary: 114 mg/dL — ABNORMAL HIGH (ref 65–99)
Glucose-Capillary: 159 mg/dL — ABNORMAL HIGH (ref 65–99)

## 2016-01-08 LAB — URIC ACID: URIC ACID, SERUM: 9.6 mg/dL — AB (ref 4.4–7.6)

## 2016-01-08 SURGERY — EGD (ESOPHAGOGASTRODUODENOSCOPY)
Anesthesia: Monitor Anesthesia Care

## 2016-01-08 MED ORDER — ALLOPURINOL 300 MG PO TABS: 150.0000 mg | ORAL_TABLET | Freq: Every day | ORAL | Status: DC

## 2016-01-08 MED ORDER — LIDOCAINE HCL (CARDIAC) 20 MG/ML IV SOLN: INTRAVENOUS | Status: DC | PRN

## 2016-01-08 MED ORDER — SODIUM CHLORIDE 0.9 % IV SOLN: INTRAVENOUS | Status: DC

## 2016-01-08 MED ORDER — BUTAMBEN-TETRACAINE-BENZOCAINE 2-2-14 % EX AERO
INHALATION_SPRAY | CUTANEOUS | Status: DC | PRN
  Administered 2016-01-08: 2 via TOPICAL

## 2016-01-08 MED ORDER — PROPOFOL 500 MG/50ML IV EMUL
INTRAVENOUS | Status: DC | PRN
  Administered 2016-01-08: 100 ug/kg/min via INTRAVENOUS

## 2016-01-08 MED ORDER — ALLOPURINOL 100 MG PO TABS
100.0000 mg | ORAL_TABLET | Freq: Every day | ORAL | Status: DC
  Administered 2016-01-08 – 2016-01-13 (×6): 100 mg via ORAL
  Filled 2016-01-08 (×6): qty 1

## 2016-01-08 MED ORDER — PROPOFOL 10 MG/ML IV BOLUS
INTRAVENOUS | Status: DC | PRN
  Administered 2016-01-08 (×2): 10 mg via INTRAVENOUS

## 2016-01-08 NOTE — Progress Notes (Signed)
Ref: Karren Cobble, MD   Subjective:  Patient had black stool last week but did not seek medical help. Today's hemoglobin is down to 6.0. Patient refuses blood transfusion. Leg edema improving. Able to walk around in room. Off Xarelto for 36 hours.  Objective:  Vital Signs in the last 24 hours: Temp:  [97.9 F (36.6 C)-99 F (37.2 C)] 97.9 F (36.6 C) (01/20 0429) Pulse Rate:  [63-70] 66 (01/20 0429) Cardiac Rhythm:  [-] Normal sinus rhythm (01/19 2100) Resp:  [18-21] 18 (01/20 0429) BP: (119-141)/(46-69) 138/67 mmHg (01/20 0429) SpO2:  [98 %-100 %] 98 % (01/20 0429) Weight:  [97.7 kg (215 lb 6.2 oz)] 97.7 kg (215 lb 6.2 oz) (01/20 0429)  Physical Exam: BP Readings from Last 1 Encounters:  01/08/16 138/67    Wt Readings from Last 1 Encounters:  01/08/16 97.7 kg (215 lb 6.2 oz)    Weight change: -2.5 kg (-5 lb 8.2 oz)  HEENT: [place]/AT, Eyes-Brown, PERL, EOMI, Conjunctiva-Pale, Sclera-Non-icteric Neck: No JVD, No bruit, Trachea midline. Lungs:  Clear, Bilateral. Cardiac:  Regular rhythm, normal S1 and S2, no S3. III/VI systolic murmur Abdomen:  Soft, non-tender. Extremities:  2 + edema present. No cyanosis. No clubbing. CNS: AxOx3, Cranial nerves grossly intact, moves all 4 extremities. Right handed. Skin: Warm and dry.   Intake/Output from previous day: 01/19 0701 - 01/20 0700 In: 1080 [P.O.:1080] Out: 2100 [Urine:2100]    Lab Results: BMET    Component Value Date/Time   NA 140 01/08/2016 0722   NA 141 01/07/2016 0730   NA 142 01/06/2016 1718   NA 144 12/16/2015 1635   NA 143 11/02/2015 1025   NA 143 08/19/2015 1233   K 4.2 01/08/2016 0722   K 4.3 01/07/2016 0730   K 4.4 01/06/2016 1718   CL 106 01/08/2016 0722   CL 108 01/07/2016 0730   CL 110 01/06/2016 1718   CO2 23 01/08/2016 0722   CO2 24 01/07/2016 0730   CO2 21* 01/06/2016 1718   GLUCOSE 120* 01/08/2016 0722   GLUCOSE 136* 01/07/2016 0730   GLUCOSE 107* 01/06/2016 1718   GLUCOSE 106* 12/16/2015  1635   GLUCOSE 131* 11/02/2015 1025   GLUCOSE 101* 08/19/2015 1233   BUN 33* 01/08/2016 0722   BUN 29* 01/07/2016 0730   BUN 28* 01/06/2016 1718   BUN 30* 12/16/2015 1635   BUN 33* 11/02/2015 1025   BUN 29* 08/19/2015 1233   CREATININE 3.01* 01/08/2016 0722   CREATININE 2.85* 01/07/2016 0730   CREATININE 2.66* 01/06/2016 1718   CREATININE 1.54* 05/28/2014 1046   CREATININE 1.43* 03/05/2014 1532   CREATININE 1.47* 12/30/2013 1532   CALCIUM 8.7* 01/08/2016 0722   CALCIUM 9.0 01/07/2016 0730   CALCIUM 8.9 01/06/2016 1718   GFRNONAA 20* 01/08/2016 0722   GFRNONAA 21* 01/07/2016 0730   GFRNONAA 23* 01/06/2016 1718   GFRNONAA 46* 05/28/2014 1046   GFRNONAA 50* 03/05/2014 1532   GFRNONAA 55* 11/18/2013 1101   GFRAA 23* 01/08/2016 0722   GFRAA 24* 01/07/2016 0730   GFRAA 27* 01/06/2016 1718   GFRAA 53* 05/28/2014 1046   GFRAA 58* 03/05/2014 1532   GFRAA 64 11/18/2013 1101   CBC    Component Value Date/Time   WBC 3.9* 01/08/2016 0722   WBC 4.3 08/19/2015 1233   RBC 2.30* 01/08/2016 0722   RBC 2.20* 01/07/2016 0120   RBC 3.70* 08/19/2015 1233   HGB 6.0* 01/08/2016 0722   HCT 19.2* 01/08/2016 0722   HCT 32.8* 08/19/2015 1233  PLT 149* 01/08/2016 0722   PLT 180 08/19/2015 1233   MCV 83.5 01/08/2016 0722   MCV 89 08/19/2015 1233   MCH 26.1 01/08/2016 0722   MCH 28.6 08/19/2015 1233   MCHC 31.3 01/08/2016 0722   MCHC 32.3 08/19/2015 1233   RDW 15.9* 01/08/2016 0722   RDW 18.1* 08/19/2015 1233   LYMPHSABS 1.6 01/06/2016 1718   MONOABS 0.4 01/06/2016 1718   EOSABS 0.1 01/06/2016 1718   BASOSABS 0.0 01/06/2016 1718   HEPATIC Function Panel  Recent Labs  12/16/15 1635 01/06/16 1718  PROT 6.8 7.0   HEMOGLOBIN A1C No components found for: HGA1C,  MPG CARDIAC ENZYMES Lab Results  Component Value Date   CKTOTAL 64 05/26/2009   CKMB 0.7 05/26/2009   TROPONINI 0.04* 01/07/2016   TROPONINI 0.04* 01/06/2016   TROPONINI 0.03 01/06/2016   BNP No results for input(s):  PROBNP in the last 8760 hours. TSH  Recent Labs  01/06/16 1718  TSH 1.179   CHOLESTEROL No results for input(s): CHOL in the last 8760 hours.  Scheduled Meds: . sodium chloride   Intravenous Once  . amLODipine  5 mg Oral Daily  . atorvastatin  20 mg Oral q1800  . ferrous Q000111Q C-folic acid  1 capsule Oral TID PC  . furosemide  80 mg Oral BID  . insulin aspart  0-9 Units Subcutaneous TID WC  . metoprolol succinate  100 mg Oral Daily  . pantoprazole (PROTONIX) IV  40 mg Intravenous Q24H  . sodium chloride  3 mL Intravenous Q12H  . terazosin  5 mg Oral QHS   Continuous Infusions:  PRN Meds:.sodium chloride, acetaminophen, guaiFENesin-dextromethorphan, ondansetron (ZOFRAN) IV, sodium chloride  Assessment/Plan: Acute left heart systolic failure Acute on chronic GI bleed Iron deficiency anemia Type 2 DM,  Acute on chronic kidney failure  Hypertension,  Hyperlipidemia,  Tobacco use disorder,  H/O venous thromboembolism,  CAD,  CABG,  PVD,  Alcohol use disorder Obesity   GI consult for source of acute bleed if treatable. Continue diuresis as tolerated. Renal function + uric acid level.   LOS: 2 days    Dixie Dials  MD  01/08/2016, 8:38 AM

## 2016-01-08 NOTE — H&P (View-Only) (Signed)
Referring Provider: Dr. Doylene Canard Primary Care Physician:  Karren Cobble, MD Primary Gastroenterologist:  Beather Arbour  Reason for Consultation:  Anemia  HPI: Kevin Castillo is a 70 y.o. male being seen for a consult due to anemia with a Hgb 6.0 (Hgb 6.6 on admit; 10.6 in August 2016). Denies melena, hematochezia, heartburn, abdominal pain. Has been having shortness of breath. Diuresis for volume overload with CHF being managed by Dr. Doylene Canard. Patient Jehovah's witness and refusing blood products. Denies previous EGD or colonoscopy. Drinks pint of gin on weekends. On Xarelto on admit.   Past Medical History  Diagnosis Date  . Essential hypertension 02/15/2007  . Hyperlipidemia 02/15/2007  . Tobacco abuse 02/15/2007  . Obesity (BMI 30.0-34.9) 02/15/2007  . Coronary artery disease 02/15/2007    Non-STEMI 07/2005, s/p CABG x 4 (08/01/2005): LIMA to LAD, SVG to OM, RCA, PCA   . Peripheral vascular occlusive disease ([place]) 12/30/2013    ABI (01/03/2014): Right 0.64, Left 0.64   . Venous thromboembolism 11/11/2013    LE Dopplers (01/2011): RLE DVT. CTA with mild subacute to chronic right-sided pulmonary emboli.  Patient has elected to continue anticoagulation.   . Alcohol abuse 02/15/2007    Reports consuming 1 pint of gin on weekends, does not drink every day. Denies history of seizures, blackouts, or tremors.    . Benign prostatic hypertrophy 06/02/2014  . Allergic rhinitis 04/09/2014  . Adenoma of left adrenal gland 06/16/2014    CT Abdomen (06/04/2014): Incidental, 20 mm, 8.33 HU suggesting benign adenoma   . Major depression, single episode 11/02/2015  . Acute left systolic heart failure ([place]) 01/06/2016  . DVT (deep venous thrombosis) ([place]) 2006    RLE "when I had OHS"  . Erectile dysfunction associated with type 2 diabetes mellitus ([place]) 02/15/2007  . Anemia 11/12/2013    Unclear cause as of yet (? EtOH abuse)   . Chronic kidney disease, stage 3 11/12/2013  . Type 2 diabetes mellitus with stage  3 chronic kidney disease ([place]) 02/15/2007    Past Surgical History  Procedure Laterality Date  . Coronary artery bypass graft  08/01/2005    x 4, LIMA to LAD, SVG to OM, RCA, PCA  . Cataract extraction w/ intraocular lens  implant, bilateral Bilateral 04/2014    Left 04/30/2014, Right 05/07/2014  . Cardiac catheterization  07/2005    Prior to Admission medications   Medication Sig Start Date End Date Taking? Authorizing Provider  amLODipine (NORVASC) 5 MG tablet Take 1 tablet (5 mg total) by mouth daily. 12/16/15  Yes Jones Bales, MD  aspirin 81 MG tablet Take 81 mg by mouth daily.   Yes Historical Provider, MD  atorvastatin (LIPITOR) 20 MG tablet Take 1 tablet (20 mg total) by mouth daily. 08/10/15  Yes Oval Linsey, MD  lisinopril (PRINIVIL,ZESTRIL) 40 MG tablet Take 1 tablet (40 mg total) by mouth daily. 08/12/15  Yes Oval Linsey, MD  metoprolol succinate (TOPROL-XL) 100 MG 24 hr tablet Take 1 tablet (100 mg total) by mouth daily. 06/15/15 06/14/16 Yes Oval Linsey, MD  rivaroxaban (XARELTO) 20 MG TABS tablet Take 1 tablet (20 mg total) by mouth daily with supper. 10/13/15  Yes Oval Linsey, MD  sildenafil (REVATIO) 20 MG tablet Take 5 tablets (100 mg total) by mouth daily as needed. 11/02/15  Yes Oval Linsey, MD  terazosin (HYTRIN) 5 MG capsule Take 1 capsule (5 mg total) by mouth at bedtime. 11/10/15  Yes Oval Linsey, MD  buPROPion (WELLBUTRIN) 100 MG tablet Take 1  tablet (100 mg total) by mouth 2 (two) times daily. 11/06/15 11/05/16  Norval Gable, MD    Scheduled Meds: . sodium chloride   Intravenous Once  . allopurinol  100 mg Oral Daily  . amLODipine  5 mg Oral Daily  . atorvastatin  20 mg Oral q1800  . ferrous Q000111Q C-folic acid  1 capsule Oral TID PC  . furosemide  80 mg Oral BID  . insulin aspart  0-9 Units Subcutaneous TID WC  . metoprolol succinate  100 mg Oral Daily  . pantoprazole (PROTONIX) IV  40 mg Intravenous Q24H  . sodium chloride   3 mL Intravenous Q12H  . terazosin  5 mg Oral QHS   Continuous Infusions:  PRN Meds:.sodium chloride, acetaminophen, guaiFENesin-dextromethorphan, ondansetron (ZOFRAN) IV, sodium chloride  Allergies as of 01/06/2016 - Review Complete 01/06/2016  Allergen Reaction Noted  . Nicotine transdermal system [nicotine] Other (See Comments) 08/19/2015    Family History  Problem Relation Age of Onset  . Heart disease Father   . COPD Father   . Diabetes Mellitus II Mother   . Healthy Sister   . Healthy Brother   . Healthy Daughter   . Healthy Son   . Healthy Sister   . Healthy Sister   . Healthy Sister   . Healthy Sister   . Healthy Daughter   . Healthy Son   . Healthy Son   . Healthy Son   . Healthy Son   . Healthy Son   . Healthy Son     Social History   Social History  . Marital Status: Married    Spouse Name: N/A  . Number of Children: N/A  . Years of Education: 10   Occupational History  . Retired     used to drive a truck locally   [employer]  . Smoking status: Current Some Day Smoker -- 0.12 packs/day for 55 years    Types: Cigarettes  . Smokeless tobacco: Former Systems developer    Types: Snuff, Chew     Comment: 01/06/2016 "stopped smokeless tobacco in the 1970s"  . Alcohol Use: 6.6 oz/week    0 Standard drinks or equivalent, 11 Shots of liquor per week     Comment: 01/06/2016 Drinks 1 pint of gin over the weekend but does not drink everyday. Denies hx of blackouts, seizures, tremors.  . Drug Use: No  . Sexual Activity: Yes    Birth Control/ Protection: None   Other Topics Concern  . 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 Only one son and one daughter live in [place]. No pets.    Review of Systems: All negative except as stated above in HPI.  Physical Exam: Vital signs: Filed Vitals:   01/08/16 0429 01/08/16 0857  BP: 138/67 141/71  Pulse: 66 72   Temp: 97.9 F (36.6 C) 98.4 F (36.9 C)  Resp: 18 20   Last BM Date: 01/06/16 General:   Alert,  Well-developed, well-nourished, pleasant and cooperative in NAD Head: atraumatic Eyes: anicteric sclera ENT: poor dentition, oropharynx clear Neck: supple, nontender Lungs:  Clear throughout to auscultation.   No wheezes, crackles, or rhonchi. No acute distress. Heart:  Regular rate and rhythm; no murmurs, clicks, rubs,  or gallops. Abdomen: soft, nontender, nondistended, +BS  Rectal:  Deferred Ext: no edema  GI:  Lab Results:  Recent Labs  01/06/16 1718  01/08/16 0722  WBC 5.0 3.9*  HGB 6.6* 6.0*  HCT 20.6* 19.2*  PLT 161 149*   BMET  Recent Labs  01/06/16 1718 01/07/16 0730 01/08/16 0722  NA 142 141 140  K 4.4 4.3 4.2  CL 110 108 106  CO2 21* 24 23  GLUCOSE 107* 136* 120*  BUN 28* 29* 33*  CREATININE 2.66* 2.85* 3.01*  CALCIUM 8.9 9.0 8.7*   LFT  Recent Labs  01/06/16 1718  PROT 7.0  ALBUMIN 3.4*  AST 27  ALT 33  ALKPHOS 105  BILITOT 0.4   PT/INR No results for input(s): LABPROT, INR in the last 72 hours.   Studies/Results: Dg Chest 2 View  01/07/2016  CLINICAL DATA:  Acute onset of congestive heart failure. Initial encounter. EXAM: CHEST  2 VIEW COMPARISON:  Chest radiograph performed 11/11/2012, and CT of the chest performed 06/04/2014 FINDINGS: The lungs are well-aerated. Small bilateral pleural effusions are noted, right greater than left, with bibasilar airspace opacities, which may reflect mild pulmonary edema given clinical concern. Pneumonia could have a similar appearance. There is no evidence of pneumothorax. The heart is enlarged. The patient is status post median sternotomy, with evidence of prior CABG. No acute osseous abnormalities are seen. IMPRESSION: Small bilateral pleural effusions, right greater than left, with bibasilar airspace opacities, which may reflect mild pulmonary edema given clinical concern. Pneumonia could have a similar  appearance. Cardiomegaly noted. Electronically Signed   By: Garald Balding M.D.   On: 01/07/2016 00:20    Impression/Plan: 70 yo with severe symptomatic anemia in need of an EGD to look for peptic ulcer disease. If EGD unrevealing, then will need a colonoscopy prior to discharge (will plan for colonoscopy to be done on 01/10/15 with Propofol sedation). NPO. Supportive care.    LOS: 2 days   [place] C.  01/08/2016, 9:23 AM  Pager [phone]  If no answer or after 5 PM call [phone]

## 2016-01-08 NOTE — Transfer of Care (Signed)
Immediate Anesthesia Transfer of Care Note  Patient: Kevin Castillo  Procedure(s) Performed: Procedure(s): ESOPHAGOGASTRODUODENOSCOPY (EGD) (N/A)  Patient Location: Endoscopy Unit  Anesthesia Type:MAC  Level of Consciousness: awake, alert  and oriented  Airway & Oxygen Therapy: Patient Spontanous Breathing and Patient connected to nasal cannula oxygen  Post-op Assessment: Report given to RN, Post -op Vital signs reviewed and stable and Patient moving all extremities X 4  Post vital signs: Reviewed and stable  Last Vitals:  Filed Vitals:   01/08/16 1105 01/08/16 1326  BP: 123/62 163/71  Pulse: 68 64  Temp: 36.9 C 36.8 C  Resp: 16 21    Complications: No apparent anesthesia complications

## 2016-01-08 NOTE — Anesthesia Preprocedure Evaluation (Addendum)
Anesthesia Evaluation  Patient identified by MRN, date of birth, ID band Patient awake    Reviewed: Allergy & Precautions, NPO status , Patient's Chart, lab work & pertinent test results  Airway Mallampati: III  TM Distance: >3 FB     Dental  (+) Dental Advisory Given, Missing, Poor Dentition, Chipped   Pulmonary Current Smoker,    + rhonchi        Cardiovascular hypertension, + CAD, + CABG, + Peripheral Vascular Disease and +CHF   Rhythm:Regular Rate:Normal     Neuro/Psych    GI/Hepatic   Endo/Other  diabetes  Renal/GU Renal disease     Musculoskeletal   Abdominal   Peds  Hematology  (+) anemia , JEHOVAH'S WITNESS  Anesthesia Other Findings   Reproductive/Obstetrics                           Anesthesia Physical Anesthesia Plan  ASA: IV  Anesthesia Plan: MAC   Post-op Pain Management:    Induction: Intravenous  Airway Management Planned: Nasal Cannula  Additional Equipment:   Intra-op Plan:   Post-operative Plan: Possible Post-op intubation/ventilation  Informed Consent: I have reviewed the patients History and Physical, chart, labs and discussed the procedure including the risks, benefits and alternatives for the proposed anesthesia with the patient or authorized representative who has indicated his/her understanding and acceptance.   Dental advisory given  Plan Discussed with: Anesthesiologist and CRNA  Anesthesia Plan Comments:        Anesthesia Quick Evaluation

## 2016-01-08 NOTE — Consult Note (Signed)
   [facility] CM Inpatient Consult   01/08/2016  Kevin Castillo 1946-10-22 [id number] Referral received. Patient evaluated for community based chronic disease management services with [facility] as a benefit of patient's [employer]. Spoke with patient and wife [name],  at bedside to explain [facility] services.  Patient endorses Dr. Eppie Gibson as his primary care provider.  Consent form signed.  Patient will receive post hospital discharge calls and will be evaluated for monthly home visits for assessments and disease process education for HF and symptom management needs.  Left contact information and [facility] literature at bedside. Made Inpatient Case Manager aware that [facility] following. Of note, [facility] Management services does not replace or interfere with any services that are arranged by inpatient case management or social work.  For additional questions or referrals please contact:   Natividad Brood, RN BSN [facility] Liaison  [phone] business mobile phone Toll free office [phone]

## 2016-01-08 NOTE — Interval H&P Note (Signed)
History and Physical Interval Note:  01/08/2016 2:10 PM  Kevin Castillo  has presented today for surgery, with the diagnosis of melena  The various methods of treatment have been discussed with the patient and family. After consideration of risks, benefits and other options for treatment, the patient has consented to  Procedure(s): ESOPHAGOGASTRODUODENOSCOPY (EGD) (N/A) as a surgical intervention .  The patient's history has been reviewed, patient examined, no change in status, stable for surgery.  I have reviewed the patient's chart and labs.  Questions were answered to the patient's satisfaction.     [place] C.

## 2016-01-08 NOTE — Progress Notes (Signed)
Pharmacist Heart Failure Core Measure Documentation  Assessment: Kevin Castillo has an EF documented as 35-40% on 01/07/16 by ECHO.  Rationale: Heart failure patients with left ventricular systolic dysfunction (LVSD) and an EF < 40% should be prescribed an angiotensin converting enzyme inhibitor (ACEI) or angiotensin receptor blocker (ARB) at discharge unless a contraindication is documented in the medical record.  This patient is not currently on an ACEI or ARB for HF.  This note is being placed in the record in order to provide documentation that a contraindication to the use of these agents is present for this encounter.  ACE Inhibitor or Angiotensin Receptor Blocker is contraindicated (specify all that apply)  []   ACEI allergy AND ARB allergy []   Angioedema []   Moderate or severe aortic stenosis []   Hyperkalemia []   Hypotension []   Renal artery stenosis [x]   Worsening renal function, preexisting renal disease or dysfunction   Deboraha Sprang 01/08/2016 11:06 AM

## 2016-01-08 NOTE — Brief Op Note (Signed)
Mild duodenitis noted otherwise normal EGD. No source of anemia seen on EGD. Inpatient colonoscopy needed. Restart diet and plan to prep on 01/09/15 for colonoscopy to be done on 01/10/15.

## 2016-01-08 NOTE — Op Note (Signed)
[facility] [place], [id number]   ENDOSCOPY PROCEDURE REPORT  PATIENT: Kevin Castillo, Kevin Castillo  MR#: [id number] BIRTHDATE: 1946-06-14 , 45  yrs. old GENDER: male ENDOSCOPIST: Wilford Corner, MD REFERRED BY: PROCEDURE DATE:  2016/01/29 PROCEDURE:  EGD, diagnostic ASA CLASS:     Class III INDICATIONS:  anemia. MEDICATIONS: Monitored anesthesia care and Per Anesthesia TOPICAL ANESTHETIC:  DESCRIPTION OF PROCEDURE: After the risks benefits and alternatives of the procedure were thoroughly explained, informed consent was obtained.  The PENTAX GASTOROSCOPE [id number] endoscope was introduced through the mouth and advanced to the second portion of the duodenum , Without limitations.  The instrument was slowly withdrawn as the mucosa was fully examined. Estimated blood loss is zero unless otherwise noted in this procedure report.    Esophagus normal. GEJ 40 cm from the incisors and normal. Stomach normal. Tiny red spot in 2nd part of duodenum without bleeding after irrigation. Mild edema and erythema in duodenal bulb c/w duodenitis. Normal appearance of 2nd part of duodenum. Retroflexed views revealed no abnormalities.     The scope was then withdrawn from the patient and the procedure completed.  COMPLICATIONS: There were no immediate complications.  ENDOSCOPIC IMPRESSION:     Duodenitis o/w normal EGD No source of anemia seen  RECOMMENDATIONS:     Inpt colonoscopy; Supportive care   eSigned:  Wilford Corner, MD January 29, 2016 2:38 PM    CC:  CPT CODES: ICD CODES:  The ICD and CPT codes recommended by this software are interpretations from the data that the clinical staff has captured with the software.  The verification of the translation of this report to the ICD and CPT codes and modifiers is the sole responsibility of the health care institution and practicing physician where this report was generated.  [employer]  will not be held responsible for the validity of the ICD and CPT codes included on this report.  AMA assumes no liability for data contained or not contained herein. CPT is a Designer, television/film set of the [employer].  PATIENT NAME:  Kevin Castillo, Kevin Castillo MR#: [id number]

## 2016-01-08 NOTE — Consult Note (Signed)
Referring Provider: Dr. Doylene Canard Primary Care Physician:  Karren Cobble, MD Primary Gastroenterologist:  Beather Arbour  Reason for Consultation:  Anemia  HPI: Kevin Castillo is a 70 y.o. male being seen for a consult due to anemia with a Hgb 6.0 (Hgb 6.6 on admit; 10.6 in August 2016). Denies melena, hematochezia, heartburn, abdominal pain. Has been having shortness of breath. Diuresis for volume overload with CHF being managed by Dr. Doylene Canard. Patient Jehovah's witness and refusing blood products. Denies previous EGD or colonoscopy. Drinks pint of gin on weekends. On Xarelto on admit.   Past Medical History  Diagnosis Date  . Essential hypertension 02/15/2007  . Hyperlipidemia 02/15/2007  . Tobacco abuse 02/15/2007  . Obesity (BMI 30.0-34.9) 02/15/2007  . Coronary artery disease 02/15/2007    Non-STEMI 07/2005, s/p CABG x 4 (08/01/2005): LIMA to LAD, SVG to OM, RCA, PCA   . Peripheral vascular occlusive disease ([place]) 12/30/2013    ABI (01/03/2014): Right 0.64, Left 0.64   . Venous thromboembolism 11/11/2013    LE Dopplers (01/2011): RLE DVT. CTA with mild subacute to chronic right-sided pulmonary emboli.  Patient has elected to continue anticoagulation.   . Alcohol abuse 02/15/2007    Reports consuming 1 pint of gin on weekends, does not drink every day. Denies history of seizures, blackouts, or tremors.    . Benign prostatic hypertrophy 06/02/2014  . Allergic rhinitis 04/09/2014  . Adenoma of left adrenal gland 06/16/2014    CT Abdomen (06/04/2014): Incidental, 20 mm, 8.33 HU suggesting benign adenoma   . Major depression, single episode 11/02/2015  . Acute left systolic heart failure ([place]) 01/06/2016  . DVT (deep venous thrombosis) ([place]) 2006    RLE "when I had OHS"  . Erectile dysfunction associated with type 2 diabetes mellitus ([place]) 02/15/2007  . Anemia 11/12/2013    Unclear cause as of yet (? EtOH abuse)   . Chronic kidney disease, stage 3 11/12/2013  . Type 2 diabetes mellitus with stage  3 chronic kidney disease ([place]) 02/15/2007    Past Surgical History  Procedure Laterality Date  . Coronary artery bypass graft  08/01/2005    x 4, LIMA to LAD, SVG to OM, RCA, PCA  . Cataract extraction w/ intraocular lens  implant, bilateral Bilateral 04/2014    Left 04/30/2014, Right 05/07/2014  . Cardiac catheterization  07/2005    Prior to Admission medications   Medication Sig Start Date End Date Taking? Authorizing Provider  amLODipine (NORVASC) 5 MG tablet Take 1 tablet (5 mg total) by mouth daily. 12/16/15  Yes Jones Bales, MD  aspirin 81 MG tablet Take 81 mg by mouth daily.   Yes Historical Provider, MD  atorvastatin (LIPITOR) 20 MG tablet Take 1 tablet (20 mg total) by mouth daily. 08/10/15  Yes Oval Linsey, MD  lisinopril (PRINIVIL,ZESTRIL) 40 MG tablet Take 1 tablet (40 mg total) by mouth daily. 08/12/15  Yes Oval Linsey, MD  metoprolol succinate (TOPROL-XL) 100 MG 24 hr tablet Take 1 tablet (100 mg total) by mouth daily. 06/15/15 06/14/16 Yes Oval Linsey, MD  rivaroxaban (XARELTO) 20 MG TABS tablet Take 1 tablet (20 mg total) by mouth daily with supper. 10/13/15  Yes Oval Linsey, MD  sildenafil (REVATIO) 20 MG tablet Take 5 tablets (100 mg total) by mouth daily as needed. 11/02/15  Yes Oval Linsey, MD  terazosin (HYTRIN) 5 MG capsule Take 1 capsule (5 mg total) by mouth at bedtime. 11/10/15  Yes Oval Linsey, MD  buPROPion (WELLBUTRIN) 100 MG tablet Take 1  tablet (100 mg total) by mouth 2 (two) times daily. 11/06/15 11/05/16  Norval Gable, MD    Scheduled Meds: . sodium chloride   Intravenous Once  . allopurinol  100 mg Oral Daily  . amLODipine  5 mg Oral Daily  . atorvastatin  20 mg Oral q1800  . ferrous Q000111Q C-folic acid  1 capsule Oral TID PC  . furosemide  80 mg Oral BID  . insulin aspart  0-9 Units Subcutaneous TID WC  . metoprolol succinate  100 mg Oral Daily  . pantoprazole (PROTONIX) IV  40 mg Intravenous Q24H  . sodium chloride   3 mL Intravenous Q12H  . terazosin  5 mg Oral QHS   Continuous Infusions:  PRN Meds:.sodium chloride, acetaminophen, guaiFENesin-dextromethorphan, ondansetron (ZOFRAN) IV, sodium chloride  Allergies as of 01/06/2016 - Review Complete 01/06/2016  Allergen Reaction Noted  . Nicotine transdermal system [nicotine] Other (See Comments) 08/19/2015    Family History  Problem Relation Age of Onset  . Heart disease Father   . COPD Father   . Diabetes Mellitus II Mother   . Healthy Sister   . Healthy Brother   . Healthy Daughter   . Healthy Son   . Healthy Sister   . Healthy Sister   . Healthy Sister   . Healthy Sister   . Healthy Daughter   . Healthy Son   . Healthy Son   . Healthy Son   . Healthy Son   . Healthy Son   . Healthy Son     Social History   Social History  . Marital Status: Married    Spouse Name: N/A  . Number of Children: N/A  . Years of Education: 10   Occupational History  . Retired     used to drive a truck locally   [place]  . Smoking status: Current Some Day Smoker -- 0.12 packs/day for 55 years    Types: Cigarettes  . Smokeless tobacco: Former Systems developer    Types: Snuff, Chew     Comment: 01/06/2016 "stopped smokeless tobacco in the 1970s"  . Alcohol Use: 6.6 oz/week    0 Standard drinks or equivalent, 11 Shots of liquor per week     Comment: 01/06/2016 Drinks 1 pint of gin over the weekend but does not drink everyday. Denies hx of blackouts, seizures, tremors.  . Drug Use: No  . Sexual Activity: Yes    Birth Control/ Protection: None   Other Topics Concern  . 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 Only one son and one daughter live in [place]. No pets.    Review of Systems: All negative except as stated above in HPI.  Physical Exam: Vital signs: Filed Vitals:   01/08/16 0429 01/08/16 0857  BP: 138/67 141/71  Pulse: 66 72   Temp: 97.9 F (36.6 C) 98.4 F (36.9 C)  Resp: 18 20   Last BM Date: 01/06/16 General:   Alert,  Well-developed, well-nourished, pleasant and cooperative in NAD Head: atraumatic Eyes: anicteric sclera ENT: poor dentition, oropharynx clear Neck: supple, nontender Lungs:  Clear throughout to auscultation.   No wheezes, crackles, or rhonchi. No acute distress. Heart:  Regular rate and rhythm; no murmurs, clicks, rubs,  or gallops. Abdomen: soft, nontender, nondistended, +BS  Rectal:  Deferred Ext: no edema  GI:  Lab Results:  Recent Labs  01/06/16 1718  01/08/16 0722  WBC 5.0 3.9*  HGB 6.6* 6.0*  HCT 20.6* 19.2*  PLT 161 149*   BMET  Recent Labs  01/06/16 1718 01/07/16 0730 01/08/16 0722  NA 142 141 140  K 4.4 4.3 4.2  CL 110 108 106  CO2 21* 24 23  GLUCOSE 107* 136* 120*  BUN 28* 29* 33*  CREATININE 2.66* 2.85* 3.01*  CALCIUM 8.9 9.0 8.7*   LFT  Recent Labs  01/06/16 1718  PROT 7.0  ALBUMIN 3.4*  AST 27  ALT 33  ALKPHOS 105  BILITOT 0.4   PT/INR No results for input(s): LABPROT, INR in the last 72 hours.   Studies/Results: Dg Chest 2 View  01/07/2016  CLINICAL DATA:  Acute onset of congestive heart failure. Initial encounter. EXAM: CHEST  2 VIEW COMPARISON:  Chest radiograph performed 11/11/2012, and CT of the chest performed 06/04/2014 FINDINGS: The lungs are well-aerated. Small bilateral pleural effusions are noted, right greater than left, with bibasilar airspace opacities, which may reflect mild pulmonary edema given clinical concern. Pneumonia could have a similar appearance. There is no evidence of pneumothorax. The heart is enlarged. The patient is status post median sternotomy, with evidence of prior CABG. No acute osseous abnormalities are seen. IMPRESSION: Small bilateral pleural effusions, right greater than left, with bibasilar airspace opacities, which may reflect mild pulmonary edema given clinical concern. Pneumonia could have a similar  appearance. Cardiomegaly noted. Electronically Signed   By: Garald Balding M.D.   On: 01/07/2016 00:20    Impression/Plan: 70 yo with severe symptomatic anemia in need of an EGD to look for peptic ulcer disease. If EGD unrevealing, then will need a colonoscopy prior to discharge (will plan for colonoscopy to be done on 01/10/15 with Propofol sedation). NPO. Supportive care.    LOS: 2 days   [place] C.  01/08/2016, 9:23 AM  Pager [phone]  If no answer or after 5 PM call [phone]

## 2016-01-08 NOTE — Care Management Important Message (Signed)
Important Message  Patient Details  Name: Kevin Castillo MRN: [id number] Date of Birth: 1946/08/22   Medicare Important Message Given:  Yes    [name] [name] 01/08/2016, 11:28 AM

## 2016-01-09 LAB — CBC
HCT: 18 % — ABNORMAL LOW (ref 39.0–52.0)
Hemoglobin: 5.6 g/dL — CL (ref 13.0–17.0)
MCH: 26 pg (ref 26.0–34.0)
MCHC: 31.1 g/dL (ref 30.0–36.0)
MCV: 83.7 fL (ref 78.0–100.0)
PLATELETS: 156 10*3/uL (ref 150–400)
RBC: 2.15 MIL/uL — ABNORMAL LOW (ref 4.22–5.81)
RDW: 16.3 % — AB (ref 11.5–15.5)
WBC: 4.5 10*3/uL (ref 4.0–10.5)

## 2016-01-09 LAB — GLUCOSE, CAPILLARY
GLUCOSE-CAPILLARY: 95 mg/dL (ref 65–99)
GLUCOSE-CAPILLARY: 139 mg/dL — AB (ref 65–99)
Glucose-Capillary: 125 mg/dL — ABNORMAL HIGH (ref 65–99)
Glucose-Capillary: 135 mg/dL — ABNORMAL HIGH (ref 65–99)

## 2016-01-09 LAB — RENAL FUNCTION PANEL
Albumin: 3 g/dL — ABNORMAL LOW (ref 3.5–5.0)
Anion gap: 6 (ref 5–15)
BUN: 36 mg/dL — AB (ref 6–20)
CHLORIDE: 109 mmol/L (ref 101–111)
CO2: 25 mmol/L (ref 22–32)
CREATININE: 3.2 mg/dL — AB (ref 0.61–1.24)
Calcium: 8.4 mg/dL — ABNORMAL LOW (ref 8.9–10.3)
GFR calc non Af Amer: 18 mL/min — ABNORMAL LOW (ref >60)
GFR, EST AFRICAN AMERICAN: 21 mL/min — AB (ref >60)
GLUCOSE: 170 mg/dL — AB (ref 65–99)
Phosphorus: 4.7 mg/dL — ABNORMAL HIGH (ref 2.5–4.6)
Potassium: 4.4 mmol/L (ref 3.5–5.1)
Sodium: 140 mmol/L (ref 135–145)

## 2016-01-09 MED ORDER — PEG 3350-KCL-NA BICARB-NACL 420 G PO SOLR
4000.0000 mL | Freq: Once | ORAL | Status: AC
  Administered 2016-01-10: 4000 mL via ORAL
  Filled 2016-01-09: qty 4000

## 2016-01-09 MED ORDER — SODIUM CHLORIDE 0.9 % IV SOLN: INTRAVENOUS | Status: DC

## 2016-01-09 NOTE — Progress Notes (Signed)
Patient ID: Kevin Castillo, male   DOB: Jun 14, 1946, 70 y.o.   MRN: [id number] [facility] Progress Note  Kevin Castillo 71 y.o. 02-Sep-1946   Subjective: Feels ok. Resting comfortably. Wife in room.  Objective: Vital signs in last 24 hours: Filed Vitals:   01/08/16 2023 01/09/16 0500  BP: 141/70 139/56  Pulse: 65 71  Temp: 98.4 F (36.9 C) 97.8 F (36.6 C)  Resp: 16 16    Physical Exam: Gen: alert, no acute distress, elderly CV: RRR Chest: CTA anteriorly Abd: soft, nontender, nondistended, +BS  Lab Results:  Recent Labs  01/08/16 0722 01/09/16 0225  NA 140 140  K 4.2 4.4  CL 106 109  CO2 23 25  GLUCOSE 120* 170*  BUN 33* 36*  CREATININE 3.01* 3.20*  CALCIUM 8.7* 8.4*  PHOS  --  4.7*    Recent Labs  01/06/16 1718 01/09/16 0225  AST 27  --   ALT 33  --   ALKPHOS 105  --   BILITOT 0.4  --   PROT 7.0  --   ALBUMIN 3.4* 3.0*    Recent Labs  01/06/16 1718 01/08/16 0722 01/09/16 0225  WBC 5.0 3.9* 4.5  NEUTROABS 2.9  --   --   HGB 6.6* 6.0* 5.6*  HCT 20.6* 19.2* 18.0*  MCV 86.2 83.5 83.7  PLT 161 149* 156   No results for input(s): LABPROT, INR in the last 72 hours.    Assessment/Plan: Severe anemia (Hgb 5.6) and refusing blood products due to religious beliefs. Colonoscopy planned for Monday 01/11/16. Will change to clear liquids tomorrow and start colon prep Sunday afternoon. Patient agreeable to plan. Dr. Cristina Gong to do procedure Monday morning.   Kevin C. 01/09/2016, 10:51 AM  Pager [phone]  If no answer or after 5 PM call [phone]

## 2016-01-09 NOTE — Progress Notes (Signed)
Clinical Social Work  CSW received inappropriate referral to assist with medications. Please consult CM for medication assistance. CSW can be consulted if further needs arise.  Sindy Messing, LCSW Weekend Coverage

## 2016-01-09 NOTE — Progress Notes (Signed)
Subjective:  Patient denies any chest pain or shortness of breath denies palpitation or dizziness lightheadedness or syncope. Denies weakness. Still refusing for packed RBC transfusion. States leg swelling has improved.  Objective:  Vital Signs in the last 24 hours: Temp:  [97.6 F (36.4 C)-98.4 F (36.9 C)] 98 F (36.7 C) (01/21 1111) Pulse Rate:  [60-74] 74 (01/21 1111) Resp:  [15-25] 16 (01/21 1111) BP: (104-163)/(47-71) 135/57 mmHg (01/21 1111) SpO2:  [99 %-100 %] 100 % (01/21 1111) Weight:  [97.523 kg (215 lb)] 97.523 kg (215 lb) (01/21 0824)  Intake/Output from previous day: 01/20 0701 - 01/21 0700 In: 67 [P.O.:720; I.V.:100] Out: 2550 [Urine:2550] Intake/Output from this shift: Total I/O In: 120 [P.O.:120] Out: 200 [Urine:200]  Physical Exam: Neck: no adenopathy, no carotid bruit, no JVD and supple, symmetrical, trachea midline Lungs: Decreased breath sound at bases with faint rales Heart: regular rate and rhythm, S1, S2 normal and 2/6 systolic murmur and S3 gallop noted Abdomen: soft, non-tender; bowel sounds normal; no masses,  no organomegaly Extremities: No clubbing cyanosis 1+ edema noted  Lab Results:  Recent Labs  01/08/16 0722 01/09/16 0225  WBC 3.9* 4.5  HGB 6.0* 5.6*  PLT 149* 156    Recent Labs  01/08/16 0722 01/09/16 0225  NA 140 140  K 4.2 4.4  CL 106 109  CO2 23 25  GLUCOSE 120* 170*  BUN 33* 36*  CREATININE 3.01* 3.20*    Recent Labs  01/06/16 2208 01/07/16 0407  TROPONINI 0.04* 0.04*   Hepatic Function Panel  Recent Labs  01/06/16 1718 01/09/16 0225  PROT 7.0  --   ALBUMIN 3.4* 3.0*  AST 27  --   ALT 33  --   ALKPHOS 105  --   BILITOT 0.4  --    No results for input(s): CHOL in the last 72 hours. No results for input(s): PROTIME in the last 72 hours.  Imaging: Imaging results have been reviewed and No results found.  Cardiac Studies:  Assessment/Plan:  Resolving Acute left heart systolic failure Acute on  chronic GI bleed Iron deficiency anemia Type 2 DM,  CKD, IV,  Hypertension,  Hyperlipidemia,  Tobacco use disorder,  H/O venous thromboembolism,  CAD,  CABG,  PVD,  Alcohol use disorder Obesity  Plan Continue present management Check labs in a.m. Patient refusing for packed RBC transfusion. Schedule for colonoscopy on Monday morning  LOS: 3 days    Charolette Forward 01/09/2016, 12:29 PM

## 2016-01-10 LAB — RENAL FUNCTION PANEL
Albumin: 3.1 g/dL — ABNORMAL LOW (ref 3.5–5.0)
Anion gap: 8 (ref 5–15)
BUN: 41 mg/dL — ABNORMAL HIGH (ref 6–20)
CHLORIDE: 107 mmol/L (ref 101–111)
CO2: 27 mmol/L (ref 22–32)
CREATININE: 3.24 mg/dL — AB (ref 0.61–1.24)
Calcium: 8.5 mg/dL — ABNORMAL LOW (ref 8.9–10.3)
GFR, EST AFRICAN AMERICAN: 21 mL/min — AB (ref >60)
GFR, EST NON AFRICAN AMERICAN: 18 mL/min — AB (ref >60)
Glucose, Bld: 177 mg/dL — ABNORMAL HIGH (ref 65–99)
POTASSIUM: 4.1 mmol/L (ref 3.5–5.1)
Phosphorus: 4.2 mg/dL (ref 2.5–4.6)
Sodium: 142 mmol/L (ref 135–145)

## 2016-01-10 LAB — CBC
HCT: 18.4 % — ABNORMAL LOW (ref 39.0–52.0)
HEMOGLOBIN: 5.8 g/dL — AB (ref 13.0–17.0)
MCH: 26.6 pg (ref 26.0–34.0)
MCHC: 31.5 g/dL (ref 30.0–36.0)
MCV: 84.4 fL (ref 78.0–100.0)
PLATELETS: 162 10*3/uL (ref 150–400)
RBC: 2.18 MIL/uL — AB (ref 4.22–5.81)
RDW: 16.3 % — AB (ref 11.5–15.5)
WBC: 4.7 10*3/uL (ref 4.0–10.5)

## 2016-01-10 LAB — GLUCOSE, CAPILLARY
GLUCOSE-CAPILLARY: 129 mg/dL — AB (ref 65–99)
Glucose-Capillary: 122 mg/dL — ABNORMAL HIGH (ref 65–99)
Glucose-Capillary: 135 mg/dL — ABNORMAL HIGH (ref 65–99)
Glucose-Capillary: 118 mg/dL — ABNORMAL HIGH (ref 65–99)

## 2016-01-10 MED ORDER — PERFLUTREN LIPID MICROSPHERE
1.0000 mL | INTRAVENOUS | Status: AC | PRN
  Filled 2016-01-10: qty 10

## 2016-01-10 NOTE — Progress Notes (Signed)
Subjective:  Patient denies any chest pain or shortness of breath. Denies any dizziness lightheadedness  Objective:  Vital Signs in the last 24 hours: Temp:  [98.2 F (36.8 C)-98.5 F (36.9 C)] 98.2 F (36.8 C) (01/22 0806) Pulse Rate:  [72-74] 74 (01/22 0806) Resp:  [18-20] 20 (01/22 0806) BP: (124-132)/(58-69) 124/58 mmHg (01/22 0806) SpO2:  [98 %] 98 % (01/22 0806) Weight:  [96.707 kg (213 lb 3.2 oz)] 96.707 kg (213 lb 3.2 oz) (01/22 0806)  Intake/Output from previous day: 01/21 0701 - 01/22 0700 In: 360 [P.O.:360] Out: 1400 [Urine:1400] Intake/Output from this shift: Total I/O In: 720 [P.O.:720] Out: -   Physical Exam: Neck: no adenopathy, no carotid bruit, no JVD and supple, symmetrical, trachea midline Lungs: clear to auscultation bilaterally Heart: regular rate and rhythm, S1, S2 normal and 2/6 systolic murmur noted Abdomen: soft, non-tender; bowel sounds normal; no masses,  no organomegaly Extremities: extremities normal, atraumatic, no cyanosis or edema  Lab Results:  Recent Labs  01/09/16 0225 01/10/16 0349  WBC 4.5 4.7  HGB 5.6* 5.8*  PLT 156 162    Recent Labs  01/09/16 0225 01/10/16 0349  NA 140 142  K 4.4 4.1  CL 109 107  CO2 25 27  GLUCOSE 170* 177*  BUN 36* 41*  CREATININE 3.20* 3.24*   No results for input(s): TROPONINI in the last 72 hours.  Invalid input(s): CK, MB Hepatic Function Panel  Recent Labs  01/10/16 0349  ALBUMIN 3.1*   No results for input(s): CHOL in the last 72 hours. No results for input(s): PROTIME in the last 72 hours.  Imaging: Imaging results have been reviewed and No results found.  Cardiac Studies:  Assessment/Plan:  Resolving Acute left heart systolic failure Acute on chronic GI bleed Iron deficiency anemia Type 2 DM,  CKD, IV,  Hypertension,  Hyperlipidemia,  Tobacco use disorder,  H/O venous thromboembolism,  CAD,  CABG,  PVD,  Alcohol use disorder Obesity  Plan Continue present  management Scheduled for colonoscopy in a.m. States now may consider blood transfusion after colonoscopy results but not now.  LOS: 4 days    Charolette Forward 01/10/2016, 11:12 AM

## 2016-01-11 ENCOUNTER — Inpatient Hospital Stay (HOSPITAL_COMMUNITY): Payer: Commercial Managed Care - HMO | Admitting: Anesthesiology

## 2016-01-11 ENCOUNTER — Encounter (HOSPITAL_COMMUNITY)
Admission: AD | Disposition: A | Discharge status: Home / Self Care | Payer: Self-pay | Source: Ambulatory Visit | Attending: Cardiovascular Disease

## 2016-01-11 ENCOUNTER — Encounter (HOSPITAL_COMMUNITY): Payer: Self-pay | Admitting: Gastroenterology

## 2016-01-11 DIAGNOSIS — D126 Benign neoplasm of colon, unspecified: Secondary | ICD-10-CM

## 2016-01-11 HISTORY — DX: Benign neoplasm of colon, unspecified: D12.6

## 2016-01-11 HISTORY — PX: COLONOSCOPY WITH PROPOFOL: SHX5780

## 2016-01-11 LAB — CBC
HCT: 18.8 % — ABNORMAL LOW (ref 39.0–52.0)
Hemoglobin: 5.9 g/dL — CL (ref 13.0–17.0)
MCH: 26.6 pg (ref 26.0–34.0)
MCHC: 31.4 g/dL (ref 30.0–36.0)
MCV: 84.7 fL (ref 78.0–100.0)
PLATELETS: 159 10*3/uL (ref 150–400)
RBC: 2.22 MIL/uL — AB (ref 4.22–5.81)
RDW: 16.7 % — AB (ref 11.5–15.5)
WBC: 4.2 10*3/uL (ref 4.0–10.5)

## 2016-01-11 LAB — RENAL FUNCTION PANEL
Albumin: 3 g/dL — ABNORMAL LOW (ref 3.5–5.0)
Anion gap: 8 (ref 5–15)
BUN: 34 mg/dL — AB (ref 6–20)
CALCIUM: 8.4 mg/dL — AB (ref 8.9–10.3)
CHLORIDE: 106 mmol/L (ref 101–111)
CO2: 26 mmol/L (ref 22–32)
CREATININE: 2.72 mg/dL — AB (ref 0.61–1.24)
GFR calc non Af Amer: 22 mL/min — ABNORMAL LOW (ref >60)
GFR, EST AFRICAN AMERICAN: 26 mL/min — AB (ref >60)
Glucose, Bld: 108 mg/dL — ABNORMAL HIGH (ref 65–99)
Phosphorus: 3.8 mg/dL (ref 2.5–4.6)
Potassium: 4 mmol/L (ref 3.5–5.1)
SODIUM: 140 mmol/L (ref 135–145)

## 2016-01-11 LAB — GLUCOSE, CAPILLARY
GLUCOSE-CAPILLARY: 103 mg/dL — AB (ref 65–99)
GLUCOSE-CAPILLARY: 122 mg/dL — AB (ref 65–99)
Glucose-Capillary: 101 mg/dL — ABNORMAL HIGH (ref 65–99)
Glucose-Capillary: 156 mg/dL — ABNORMAL HIGH (ref 65–99)

## 2016-01-11 SURGERY — COLONOSCOPY WITH PROPOFOL
Anesthesia: Monitor Anesthesia Care

## 2016-01-11 MED ORDER — PROPOFOL 500 MG/50ML IV EMUL
INTRAVENOUS | Status: DC | PRN
  Administered 2016-01-11: 100 ug/kg/min via INTRAVENOUS

## 2016-01-11 MED ORDER — PROPOFOL 10 MG/ML IV BOLUS
INTRAVENOUS | Status: DC | PRN
  Administered 2016-01-11: 30 mg via INTRAVENOUS

## 2016-01-11 MED ORDER — SODIUM CHLORIDE 0.9 % IV SOLN
INTRAVENOUS | Status: DC
  Administered 2016-01-11: 21:00:00 via INTRAVENOUS

## 2016-01-11 MED ORDER — FENTANYL CITRATE (PF) 100 MCG/2ML IJ SOLN
INTRAMUSCULAR | Status: DC | PRN
  Administered 2016-01-11: 50 ug via INTRAVENOUS

## 2016-01-11 NOTE — Progress Notes (Signed)
Patient's colonoscopy was well tolerated, and showed only a couple of small polyps, no real explanation for his severe anemia.  Findings discussed with patient, wife, and Dr. Doylene Canard.  My current plan is to do a Givens capsule study tomorrow, to complete his GI tract evaluation. I have discussed the nature, purpose, and risks of that procedure with the patient and his wife and they are agreeable to proceed.  From the GI procedural standpoint, it would be possible to resume the patient's anticoagulation at any time, but Dr. Doylene Canard points out that, given the fact that this patient is a [place] Witness who does not wish to have blood transfusions, he anticipates not restarting anticoagulation, at least anytime soon.  Cleotis Nipper, M.D. Pager [phone] If no answer or after 5 PM call [phone]

## 2016-01-11 NOTE — Progress Notes (Signed)
Ref: Karren Cobble, MD   Subjective:  Sitting up. Aware of no lesions in colon. Awaiting capsule study tomorrow. Against using blood.  Objective:  Vital Signs in the last 24 hours: Temp:  [97.8 F (36.6 C)-98.4 F (36.9 C)] 97.8 F (36.6 C) (01/23 1020) Pulse Rate:  [71-77] 74 (01/23 1046) Cardiac Rhythm:  [-] Normal sinus rhythm (01/23 0825) Resp:  [20-22] 21 (01/23 1030) BP: (131-182)/(48-72) 163/70 mmHg (01/23 1046) SpO2:  [97 %-100 %] 98 % (01/23 1046) Weight:  [93.895 kg (207 lb)-94.121 kg (207 lb 8 oz)] 93.895 kg (207 lb) (01/23 0848)  Physical Exam: BP Readings from Last 1 Encounters:  01/11/16 163/70    Wt Readings from Last 1 Encounters:  01/11/16 93.895 kg (207 lb)    Weight change: -0.816 kg (-1 lb 12.8 oz)  HEENT: [place]/AT, Eyes-Brown, PERL, EOMI, Conjunctiva-Pink, Sclera-Non-icteric Neck: No JVD, No bruit, Trachea midline. Lungs:  Clear, Bilateral. Cardiac:  Regular rhythm, normal S1 and S2, no S3. III/VI systolic murmur. Abdomen:  Soft, non-tender. Extremities:  2 + edema present. No cyanosis. No clubbing. CNS: AxOx3, Cranial nerves grossly intact, moves all 4 extremities. Right handed. Skin: Warm and dry.   Intake/Output from previous day: 01/22 0701 - 01/23 0700 In: 1920 [P.O.:1920] Out: 1300 [Urine:1300]    Lab Results: BMET    Component Value Date/Time   NA 140 01/11/2016 0437   NA 142 01/10/2016 0349   NA 140 01/09/2016 0225   NA 144 12/16/2015 1635   NA 143 11/02/2015 1025   NA 143 08/19/2015 1233   K 4.0 01/11/2016 0437   K 4.1 01/10/2016 0349   K 4.4 01/09/2016 0225   CL 106 01/11/2016 0437   CL 107 01/10/2016 0349   CL 109 01/09/2016 0225   CO2 26 01/11/2016 0437   CO2 27 01/10/2016 0349   CO2 25 01/09/2016 0225   GLUCOSE 108* 01/11/2016 0437   GLUCOSE 177* 01/10/2016 0349   GLUCOSE 170* 01/09/2016 0225   GLUCOSE 106* 12/16/2015 1635   GLUCOSE 131* 11/02/2015 1025   GLUCOSE 101* 08/19/2015 1233   BUN 34* 01/11/2016 0437   BUN  41* 01/10/2016 0349   BUN 36* 01/09/2016 0225   BUN 30* 12/16/2015 1635   BUN 33* 11/02/2015 1025   BUN 29* 08/19/2015 1233   CREATININE 2.72* 01/11/2016 0437   CREATININE 3.24* 01/10/2016 0349   CREATININE 3.20* 01/09/2016 0225   CREATININE 1.54* 05/28/2014 1046   CREATININE 1.43* 03/05/2014 1532   CREATININE 1.47* 12/30/2013 1532   CALCIUM 8.4* 01/11/2016 0437   CALCIUM 8.5* 01/10/2016 0349   CALCIUM 8.4* 01/09/2016 0225   GFRNONAA 22* 01/11/2016 0437   GFRNONAA 18* 01/10/2016 0349   GFRNONAA 18* 01/09/2016 0225   GFRNONAA 46* 05/28/2014 1046   GFRNONAA 50* 03/05/2014 1532   GFRNONAA 55* 11/18/2013 1101   GFRAA 26* 01/11/2016 0437   GFRAA 21* 01/10/2016 0349   GFRAA 21* 01/09/2016 0225   GFRAA 53* 05/28/2014 1046   GFRAA 58* 03/05/2014 1532   GFRAA 64 11/18/2013 1101   CBC    Component Value Date/Time   WBC 4.2 01/11/2016 0437   WBC 4.3 08/19/2015 1233   RBC 2.22* 01/11/2016 0437   RBC 2.20* 01/07/2016 0120   RBC 3.70* 08/19/2015 1233   HGB 5.9* 01/11/2016 0437   HCT 18.8* 01/11/2016 0437   HCT 32.8* 08/19/2015 1233   PLT 159 01/11/2016 0437   PLT 180 08/19/2015 1233   MCV 84.7 01/11/2016 0437   MCV 89  08/19/2015 1233   MCH 26.6 01/11/2016 0437   MCH 28.6 08/19/2015 1233   MCHC 31.4 01/11/2016 0437   MCHC 32.3 08/19/2015 1233   RDW 16.7* 01/11/2016 0437   RDW 18.1* 08/19/2015 1233   LYMPHSABS 1.6 01/06/2016 1718   MONOABS 0.4 01/06/2016 1718   EOSABS 0.1 01/06/2016 1718   BASOSABS 0.0 01/06/2016 1718   HEPATIC Function Panel  Recent Labs  12/16/15 1635 01/06/16 1718  PROT 6.8 7.0   HEMOGLOBIN A1C No components found for: HGA1C,  MPG CARDIAC ENZYMES Lab Results  Component Value Date   CKTOTAL 64 05/26/2009   CKMB 0.7 05/26/2009   TROPONINI 0.04* 01/07/2016   TROPONINI 0.04* 01/06/2016   TROPONINI 0.03 01/06/2016   BNP No results for input(s): PROBNP in the last 8760 hours. TSH  Recent Labs  01/06/16 1718  TSH 1.179   CHOLESTEROL No  results for input(s): CHOL in the last 8760 hours.  Scheduled Meds: . allopurinol  100 mg Oral Daily  . atorvastatin  20 mg Oral q1800  . ferrous Q000111Q C-folic acid  1 capsule Oral TID PC  . furosemide  80 mg Oral BID  . insulin aspart  0-9 Units Subcutaneous TID WC  . metoprolol succinate  100 mg Oral Daily  . pantoprazole (PROTONIX) IV  40 mg Intravenous Q24H  . sodium chloride  3 mL Intravenous Q12H  . terazosin  5 mg Oral QHS   Continuous Infusions: . sodium chloride     PRN Meds:.sodium chloride, acetaminophen, guaiFENesin-dextromethorphan, ondansetron (ZOFRAN) IV, sodium chloride  Assessment/Plan: Acute left heart systolic failure Acute on chronic GI bleed Iron deficiency anemia Type 2 DM,  Acute on chronic kidney failure  Hypertension,  Hyperlipidemia,  Tobacco use disorder,  H/O venous thromboembolism,  CAD,  CABG,  PVD,  Alcohol use disorder Obesity   Continue diuresis Medical treatment. Offered SNF-declined. Patient has good support at home.   LOS: 5 days    Dixie Dials  MD  01/11/2016, 3:10 PM     Sitting up

## 2016-01-11 NOTE — Anesthesia Preprocedure Evaluation (Addendum)
Anesthesia Evaluation  Patient identified by MRN, date of birth, ID band Patient awake    Reviewed: Allergy & Precautions, NPO status , Patient's Chart, lab work & pertinent test results  Airway Mallampati: I  TM Distance: >3 FB Neck ROM: Full    Dental  (+) Poor Dentition, Missing   Pulmonary Current Smoker,    breath sounds clear to auscultation       Cardiovascular hypertension, + angina + CAD, + Peripheral Vascular Disease and +CHF   Rhythm:Regular Rate:Normal     Neuro/Psych    GI/Hepatic   Endo/Other  diabetes  Renal/GU Renal disease     Musculoskeletal   Abdominal (+) + obese,   Peds  Hematology  (+) anemia , Refuses blood products   Anesthesia Other Findings   Reproductive/Obstetrics                          Anesthesia Physical Anesthesia Plan  ASA: IV  Anesthesia Plan: MAC   Post-op Pain Management:    Induction: Intravenous  Airway Management Planned: Nasal Cannula, Natural Airway and Simple Face Mask  Additional Equipment:   Intra-op Plan:   Post-operative Plan:   Informed Consent: I have reviewed the patients History and Physical, chart, labs and discussed the procedure including the risks, benefits and alternatives for the proposed anesthesia with the patient or authorized representative who has indicated his/her understanding and acceptance.   Dental advisory given  Plan Discussed with: CRNA and Surgeon  Anesthesia Plan Comments:        Anesthesia Quick Evaluation

## 2016-01-11 NOTE — Op Note (Signed)
[facility] [place], [id number]   COLONOSCOPY PROCEDURE REPORT  PATIENT: Kevin Castillo, Kevin Castillo  MR#: [id number] BIRTHDATE: 10/29/1946 , 79  yrs. old GENDER: male ENDOSCOPIST: Ronald Lobo, MD REFERRED BY:  Dr. Dixie Dials PROCEDURE DATE:  Feb 01, 2016 PROCEDURE:   colonoscopy with polypectomy ASA CLASS:   III INDICATIONS:  iron deficiency anemia (Hemoccult status not determined) in a patient on aspirin and Xarelto with negative endoscopy MEDICATIONS: fentanyl and propofol per anesthesia (MAC)  DESCRIPTION OF PROCEDURE:  The patient was brought from his hospital room to the [facility] cone endoscopy unit.  This procedure was performed with the patient off his Xarelto. He had not had aspirin for several days.  After the risks and benefits and of the procedure were explained, informed consent was obtained.  Digital exam normal, prostate gland partially felt and without nodules.  The [employer] Adult Colon [id number]  endoscope was introduced through the anus and advanced to the cecum, as identified by visualization of the appendiceal orifice and ileocecal valve  .  The quality of the prep was excellent      .  The instrument was then slowly withdrawn as the colon was fully examined. Estimated blood loss was minimal.   No source of anemia was found on this examination.  2 small polyps, each measuring about 6-7 mm in diameter, and semi-pedunculated in morphology, were removed by cold snare technique from the descending colon, with minimal blood loss. It is felt that all polyp tissue was successfully excised. The first polyp was removed in 2 pieces and it appears that one of the 2 pieces was not successfully retrieved for histologic analysis, whereas the other piece was. The second polyp was completely retrieved.  There was minimal sigmoid diverticulosis.  There was no evidence of any large masses, vascular ectasia, or colitis.   Retroflexion in  the rectum was normal, as well as reinspection of the rectum.  The scope was then withdrawn from the patient and the procedure completed.  WITHDRAWAL TIME:  per nursing notes  COMPLICATIONS: There were no immediate complications.  ENDOSCOPIC IMPRESSION: 1. No source of iron deficiency anemia observed on this examination 2. 2 small polyps removed as described above 3. Minimal sigmoid diverticulosis  RECOMMENDATIONS: 1. Await pathology on the polyps. If either or both are adenomatous in character, consider surveillance examination in 5 years, depending on the patient's health status at that time, taking into account his age, and his significant current medical problems including ischemic cardiomyopathy, advanced chronic renal insufficiency, atrial fibrillation on Xarelto, and peripheral vascular disease. 2. Givens capsule study tomorrow to check for source of chronic anemia in the small intestine, since no source was evident either on his upper endoscopy 3 days ago, or today's colonoscopy. 3. Okay to restart Xarelto this evening  REPEAT EXAM: to be arranged, depending on pathology results.  cc:  _______________________________ eSignedRonald Lobo, MD 2016-02-01 10:27 AM   CPT CODES: ICD CODES:  The ICD and CPT codes recommended by this software are interpretations from the data that the clinical staff has captured with the software.  The verification of the translation of this report to the ICD and CPT codes and modifiers is the sole responsibility of the health care institution and practicing physician where this report was generated.  [employer]. will not be held responsible for the validity of the ICD and CPT codes included on this report.  AMA assumes no liability for data contained or not  contained herein. CPT is a Designer, television/film set of the [employer].   PATIENT NAME:  Kevin Castillo, Kevin Castillo MR#: [id number]

## 2016-01-11 NOTE — Interval H&P Note (Signed)
History and Physical Interval Note:  01/11/2016 9:28 AM  Kevin Castillo  has presented today for surgery, with the diagnosis of Anemia  The various methods of treatment have been discussed with the patient and family. After consideration of risks, benefits and other options for treatment, the patient has consented to  Procedure(s) with comments: COLONOSCOPY WITH PROPOFOL (N/A) - Needs MAC. as a surgical intervention .  The patient's history has been reviewed, patient examined, no change in status, stable for surgery.  I have reviewed the patient's chart and labs.  Questions were answered to the patient's satisfaction.   They are also agreeable to Givens capsule endoscopy of small bowel if today's test is unrevealing for source of patient's anemia.   Cleotis Nipper

## 2016-01-11 NOTE — Transfer of Care (Signed)
Immediate Anesthesia Transfer of Care Note  Patient: Kevin Castillo  Procedure(s) Performed: Procedure(s) with comments: COLONOSCOPY WITH PROPOFOL (N/A) - Needs MAC.  Patient Location: Endoscopy Unit  Anesthesia Type:MAC  Level of Consciousness: awake, alert  and oriented  Airway & Oxygen Therapy: Patient Spontanous Breathing and Patient connected to face mask oxygen  Post-op Assessment: Report given to RN, Post -op Vital signs reviewed and stable and Patient moving all extremities X 4  Post vital signs: Reviewed and stable  Last Vitals:  Filed Vitals:   01/11/16 0504 01/11/16 0848  BP: 138/67 182/72  Pulse: 75 73  Temp: 36.7 C 36.9 C  Resp: 22 20    Complications: No apparent anesthesia complications

## 2016-01-11 NOTE — Anesthesia Procedure Notes (Signed)
Procedure Name: MAC Date/Time: 01/11/2016 9:34 AM Performed by: Kyung Rudd Pre-anesthesia Checklist: Patient identified, Emergency Drugs available, Suction available, Patient being monitored and Timeout performed Patient Re-evaluated:Patient Re-evaluated prior to inductionOxygen Delivery Method: Simple face mask Intubation Type: IV induction Placement Confirmation: positive ETCO2

## 2016-01-12 ENCOUNTER — Encounter (HOSPITAL_COMMUNITY)
Admission: AD | Disposition: A | Discharge status: Home / Self Care | Payer: Self-pay | Source: Ambulatory Visit | Attending: Cardiovascular Disease

## 2016-01-12 ENCOUNTER — Encounter (HOSPITAL_COMMUNITY): Payer: Self-pay | Admitting: Gastroenterology

## 2016-01-12 HISTORY — PX: GIVENS CAPSULE STUDY: SHX5432

## 2016-01-12 LAB — GLUCOSE, CAPILLARY
GLUCOSE-CAPILLARY: 138 mg/dL — AB (ref 65–99)
GLUCOSE-CAPILLARY: 152 mg/dL — AB (ref 65–99)
Glucose-Capillary: 107 mg/dL — ABNORMAL HIGH (ref 65–99)
Glucose-Capillary: 128 mg/dL — ABNORMAL HIGH (ref 65–99)

## 2016-01-12 SURGERY — IMAGING PROCEDURE, GI TRACT, INTRALUMINAL, VIA CAPSULE
Anesthesia: LOCAL

## 2016-01-12 SURGICAL SUPPLY — 1 items: TOWEL COTTON PACK 4EA (MISCELLANEOUS) ×4 IMPLANT

## 2016-01-12 NOTE — Anesthesia Postprocedure Evaluation (Signed)
Anesthesia Post Note  Patient: Kevin Castillo  Procedure(s) Performed: Procedure(s) (LRB): COLONOSCOPY WITH PROPOFOL (N/A)  Patient location during evaluation: PACU Anesthesia Type: General Level of consciousness: awake and alert Pain management: pain level controlled Vital Signs Assessment: post-procedure vital signs reviewed and stable Respiratory status: spontaneous breathing, nonlabored ventilation, respiratory function stable and patient connected to nasal cannula oxygen Cardiovascular status: blood pressure returned to baseline and stable Postop Assessment: no signs of nausea or vomiting Anesthetic complications: no    Last Vitals:  Filed Vitals:   01/12/16 1056 01/12/16 1126  BP: 140/70 145/57  Pulse: 76 66  Temp:  37.1 C  Resp:  16    Last Pain: There were no vitals filed for this visit.               Philippa Vessey,JAMES TERRILL

## 2016-01-12 NOTE — Care Management Important Message (Signed)
Important Message  Patient Details  Name: Kevin Castillo MRN: [id number] Date of Birth: 1946/08/02   Medicare Important Message Given:  Yes    Kevin Castillo Kevin Castillo 01/12/2016, 3:33 PM

## 2016-01-12 NOTE — Progress Notes (Signed)
(  No charge--patient not seen today)  Capsule study in progress.  All polyps are benign adenomas. Ordinarily, we would plan a surveillance examination for about 3-5 years from now , but given this patient's significant comorbidities, I think it is questionable whether such surveillance would be appropriate, given the small size of the polyps that were removed yesterday. Accordingly, I will not plan to put him on her surveillance colonoscopy list.  I will plan to read the patient's capsule study on Thursday. I will not be rounding on him tomorrow (Wednesday), but Dr. Wonda Horner is available if there are any questions.  From the GI standpoint, the patient is okay for discharge at any time after today's capsule study is completed. I will take care of contacting him regarding his polyp results and his capsule endoscopy findings, when available.  Kevin Castillo, M.D. Pager [phone] If no answer or after 5 PM call [phone]

## 2016-01-12 NOTE — Progress Notes (Signed)
Ref: Karren Cobble, MD   Subjective:  Capsule endoscopy started. Discussed risk of resuming blood thinners without correcting very low hemoglobin level.  Objective:  Vital Signs in the last 24 hours: Temp:  [98.4 F (36.9 C)-98.9 F (37.2 C)] 98.7 F (37.1 C) (01/24 1126) Pulse Rate:  [66-77] 66 (01/24 1126) Cardiac Rhythm:  [-] Normal sinus rhythm (01/24 1900) Resp:  [16-18] 16 (01/24 1126) BP: (133-148)/(52-70) 145/57 mmHg (01/24 1126) SpO2:  [99 %-100 %] 100 % (01/24 1126) Weight:  [99.111 kg (218 lb 8 oz)] 99.111 kg (218 lb 8 oz) (01/24 0550)  Physical Exam: BP Readings from Last 1 Encounters:  01/12/16 145/57    Wt Readings from Last 1 Encounters:  01/12/16 99.111 kg (218 lb 8 oz)    Weight change: -2.812 kg (-6 lb 3.2 oz)  HEENT: [place]/AT, Eyes-Brown, PERL, EOMI, Conjunctiva-Pale, Sclera-Non-icteric Neck: No JVD, No bruit, Trachea midline. Lungs:  Clear, Bilateral. Cardiac:  Regular rhythm, normal S1 and S2, no S3. III/VI systolic murmur. Abdomen:  Soft, non-tender. Extremities:  2 + edema present. No cyanosis. No clubbing. CNS: AxOx3, Cranial nerves grossly intact, moves all 4 extremities. Right handed. Skin: Warm and dry.   Intake/Output from previous day: 01/23 0701 - 01/24 0700 In: 680 [P.O.:480; I.V.:200] Out: 600 [Urine:600]    Lab Results: BMET    Component Value Date/Time   NA 140 01/11/2016 0437   NA 142 01/10/2016 0349   NA 140 01/09/2016 0225   NA 144 12/16/2015 1635   NA 143 11/02/2015 1025   NA 143 08/19/2015 1233   K 4.0 01/11/2016 0437   K 4.1 01/10/2016 0349   K 4.4 01/09/2016 0225   CL 106 01/11/2016 0437   CL 107 01/10/2016 0349   CL 109 01/09/2016 0225   CO2 26 01/11/2016 0437   CO2 27 01/10/2016 0349   CO2 25 01/09/2016 0225   GLUCOSE 108* 01/11/2016 0437   GLUCOSE 177* 01/10/2016 0349   GLUCOSE 170* 01/09/2016 0225   GLUCOSE 106* 12/16/2015 1635   GLUCOSE 131* 11/02/2015 1025   GLUCOSE 101* 08/19/2015 1233   BUN 34*  01/11/2016 0437   BUN 41* 01/10/2016 0349   BUN 36* 01/09/2016 0225   BUN 30* 12/16/2015 1635   BUN 33* 11/02/2015 1025   BUN 29* 08/19/2015 1233   CREATININE 2.72* 01/11/2016 0437   CREATININE 3.24* 01/10/2016 0349   CREATININE 3.20* 01/09/2016 0225   CREATININE 1.54* 05/28/2014 1046   CREATININE 1.43* 03/05/2014 1532   CREATININE 1.47* 12/30/2013 1532   CALCIUM 8.4* 01/11/2016 0437   CALCIUM 8.5* 01/10/2016 0349   CALCIUM 8.4* 01/09/2016 0225   GFRNONAA 22* 01/11/2016 0437   GFRNONAA 18* 01/10/2016 0349   GFRNONAA 18* 01/09/2016 0225   GFRNONAA 46* 05/28/2014 1046   GFRNONAA 50* 03/05/2014 1532   GFRNONAA 55* 11/18/2013 1101   GFRAA 26* 01/11/2016 0437   GFRAA 21* 01/10/2016 0349   GFRAA 21* 01/09/2016 0225   GFRAA 53* 05/28/2014 1046   GFRAA 58* 03/05/2014 1532   GFRAA 64 11/18/2013 1101   CBC    Component Value Date/Time   WBC 4.2 01/11/2016 0437   WBC 4.3 08/19/2015 1233   RBC 2.22* 01/11/2016 0437   RBC 2.20* 01/07/2016 0120   RBC 3.70* 08/19/2015 1233   HGB 5.9* 01/11/2016 0437   HCT 18.8* 01/11/2016 0437   HCT 32.8* 08/19/2015 1233   PLT 159 01/11/2016 0437   PLT 180 08/19/2015 1233   MCV 84.7 01/11/2016 0437  MCV 89 08/19/2015 1233   MCH 26.6 01/11/2016 0437   MCH 28.6 08/19/2015 1233   MCHC 31.4 01/11/2016 0437   MCHC 32.3 08/19/2015 1233   RDW 16.7* 01/11/2016 0437   RDW 18.1* 08/19/2015 1233   LYMPHSABS 1.6 01/06/2016 1718   MONOABS 0.4 01/06/2016 1718   EOSABS 0.1 01/06/2016 1718   BASOSABS 0.0 01/06/2016 1718   HEPATIC Function Panel  Recent Labs  12/16/15 1635 01/06/16 1718  PROT 6.8 7.0   HEMOGLOBIN A1C No components found for: HGA1C,  MPG CARDIAC ENZYMES Lab Results  Component Value Date   CKTOTAL 64 05/26/2009   CKMB 0.7 05/26/2009   TROPONINI 0.04* 01/07/2016   TROPONINI 0.04* 01/06/2016   TROPONINI 0.03 01/06/2016   BNP No results for input(s): PROBNP in the last 8760 hours. TSH  Recent Labs  01/06/16 1718  TSH 1.179    CHOLESTEROL No results for input(s): CHOL in the last 8760 hours.  Scheduled Meds: . allopurinol  100 mg Oral Daily  . atorvastatin  20 mg Oral q1800  . ferrous Q000111Q C-folic acid  1 capsule Oral TID PC  . furosemide  80 mg Oral BID  . insulin aspart  0-9 Units Subcutaneous TID WC  . metoprolol succinate  100 mg Oral Daily  . pantoprazole (PROTONIX) IV  40 mg Intravenous Q24H  . sodium chloride  3 mL Intravenous Q12H  . terazosin  5 mg Oral QHS   Continuous Infusions: . sodium chloride     PRN Meds:.sodium chloride, acetaminophen, guaiFENesin-dextromethorphan, ondansetron (ZOFRAN) IV, sodium chloride  Assessment/Plan: Acute left heart systolic failure Acute on chronic GI bleed Iron deficiency anemia Type 2 DM,  Acute on chronic kidney failure  Hypertension,  Hyperlipidemia,  Tobacco use disorder,  H/O venous thromboembolism,  CAD,  CABG,  PVD,  Alcohol use disorder Obesity   Continue diuresis as tolerated. Patient agrees to medical treatment as much as possible without blood thinners. Increase activity.     LOS: 6 days    Dixie Dials  MD  01/12/2016, 7:50 PM

## 2016-01-12 NOTE — Anesthesia Postprocedure Evaluation (Signed)
Anesthesia Post Note  Patient: Kevin Castillo  Procedure(s) Performed: Procedure(s) (LRB): ESOPHAGOGASTRODUODENOSCOPY (EGD) (N/A)  Patient location during evaluation: PACU Anesthesia Type: MAC Level of consciousness: awake and alert Pain management: pain level controlled Vital Signs Assessment: post-procedure vital signs reviewed and stable Respiratory status: spontaneous breathing, nonlabored ventilation, respiratory function stable and patient connected to nasal cannula oxygen Cardiovascular status: stable and blood pressure returned to baseline Anesthetic complications: no                  Zenaida Deed

## 2016-01-13 ENCOUNTER — Encounter (HOSPITAL_COMMUNITY): Payer: Self-pay | Admitting: Gastroenterology

## 2016-01-13 LAB — GLUCOSE, CAPILLARY
GLUCOSE-CAPILLARY: 120 mg/dL — AB (ref 65–99)
GLUCOSE-CAPILLARY: 121 mg/dL — AB (ref 65–99)

## 2016-01-13 MED ORDER — ALLOPURINOL 100 MG PO TABS: 100.0000 mg | ORAL_TABLET | Freq: Every day | ORAL | Status: DC

## 2016-01-13 MED ORDER — PANTOPRAZOLE SODIUM 40 MG PO TBEC: 40.0000 mg | DELAYED_RELEASE_TABLET | Freq: Every day | ORAL | Status: DC

## 2016-01-13 MED ORDER — FERROUS SULFATE 325 (65 FE) MG PO TABS: 325.0000 mg | ORAL_TABLET | Freq: Every day | ORAL | Status: DC

## 2016-01-13 MED ORDER — FUROSEMIDE 80 MG PO TABS: 80.0000 mg | ORAL_TABLET | Freq: Every day | ORAL | Status: DC

## 2016-01-13 NOTE — Progress Notes (Signed)
Patient was discharge to home at 3:45pm accompanied by family members at NT via wheelchair/private car. Prescriptions, discharge instructions, medication regimen and education given. Patient verbalized understanding. All personal belongings given. Denies any pain. No distress noted.  Telemetry box and IV remove and site in good condition prior to discharge.

## 2016-01-13 NOTE — Discharge Summary (Signed)
Physician Discharge Summary  Patient ID: Kevin Castillo MRN: [id number] DOB/AGE: Aug 15, 1946 70 y.o.  Admit date: 01/06/2016 Discharge date: 01/13/2016  Admission Diagnoses: Acute left heart systolic failure Type 2 DM,  CKD, III,  Hypertension,  Hyperlipidemia,  Tobacco use disorder,  H/O venous thromboembolism,  CAD,  CABG,  PVD,  Alcohol use disorder Obesity   Discharge Diagnoses:  Principal Problem:   Acute left systolic heart failure (HCC) Active Problems:   Acute on chronic GI bleed   Iron deficiency anemia   Dilated and ischemic cardiomyopathy   Edema of both legs   Type 2 DM, diet controlled   Acute on chronic kidney failure   CKD, III,    Essential hypertension,    Hyperlipidemia,    Tobacco use disorder,    H/O venous thromboembolism,    CAD,    CABG,    PVD,    Alcohol use disorder   Obesity  (BMI-30.0 to 34.9)  Discharged Condition: stable  Hospital Course: 70 year old male with past history of type 2 DM, CKD, III, hypertension, Hyperlipidemia, tobacco use disorder, venous thromboembolism, CAD, CABG, PVD, alcohol use disorder and obesity has 4 week history of leg edema and now for 2 weeks he is short of breath without chest pain, cough or fever. His hemoglobin was low at 6.6 gram on admission and 5.9 two days ago. He is Jehovah witness and refused blood transfusion. GI consult was obtained with no source of bleed found on EGD or colonoscopy. Capsule endoscopy result is pending at time of discharge. His aspirin and Xarelto were discontinued and he was started on iron supplements. He diuresed well with improved breathing and was discharged home in stable condition. He will be followed by me in 1 week and by primary care in 1 month.   Consults: cardiology and GI  Significant Diagnostic Studies: labs: Low Hgb of 6.6 g/dL. Low normal WBC and platelets count. Iron level low at 18 mcg. BNP elevated 1502.8 Near normal Troponin I levels. BUN/CR-28/2.66  on admission and 34/2.72 on discharge.  EKG-SR, infero-lateral ischemia  Chest X-ray- Small bilateral pleural effusions, right greater than left, with bibasilar airspace opacities, which may reflect mild pulmonary edema given clinical concern. Pneumonia could have a similar appearance. Cardiomegaly noted.  Echo:  - Left ventricle: The cavity size was moderately dilated. Systolicfunction was moderately reduced. The estimated ejection fractionwas in the range of 35% to 40%. Diffuse hypokinesis. - Mitral valve: Calcified annulus. There was moderateregurgitation. - Left atrium: The atrium was moderately to severely dilated. - Right ventricle: Systolic function was moderately reduced. - Right atrium: The atrium was moderately dilated. - Tricuspid valve: There was moderate regurgitation.  Treatments: cardiac meds: metoprolol, amlodipine and atorvastatin  Discharge Exam: Blood pressure 142/50, pulse 65, temperature 98 F (36.7 C), temperature source Oral, resp. rate 18, height 5\' 7"  (1.702 m), weight 98.612 kg (217 lb 6.4 oz), SpO2 98 %. HEENT: [place]/AT, Eyes-Brown, PERL, EOMI, Conjunctiva-Pale, Sclera-Non-icteric Neck: No JVD, No bruit, Trachea midline. Lungs: Clear, Bilateral. Cardiac: Regular rhythm, normal S1 and S2, no S3. III/VI systolic murmur. Abdomen: Soft, non-tender. Extremities: 1 + edema present. No cyanosis. No clubbing. CNS: AxOx3, Cranial nerves grossly intact, moves all 4 extremities. Right handed. Skin: Warm and dry.  Disposition: 01-Home or Self Care     Medication List    STOP taking these medications        aspirin 81 MG tablet     buPROPion 100 MG tablet  Commonly known as:  WELLBUTRIN     lisinopril 40 MG tablet  Commonly known as:  PRINIVIL,ZESTRIL     rivaroxaban 20 MG Tabs tablet  Commonly known as:  XARELTO     sildenafil 20 MG tablet  Commonly known as:  REVATIO      TAKE these medications        allopurinol 100 MG tablet  Commonly known  as:  ZYLOPRIM  Take 1 tablet (100 mg total) by mouth daily.     amLODipine 5 MG tablet  Commonly known as:  NORVASC  Take 1 tablet (5 mg total) by mouth daily.     atorvastatin 20 MG tablet  Commonly known as:  LIPITOR  Take 1 tablet (20 mg total) by mouth daily.     ferrous sulfate 325 (65 FE) MG tablet  Take 1 tablet (325 mg total) by mouth daily with breakfast.     furosemide 80 MG tablet  Commonly known as:  LASIX  Take 1 tablet (80 mg total) by mouth daily.     metoprolol succinate 100 MG 24 hr tablet  Commonly known as:  TOPROL-XL  Take 1 tablet (100 mg total) by mouth daily.     pantoprazole 40 MG tablet  Commonly known as:  PROTONIX  Take 1 tablet (40 mg total) by mouth daily.     terazosin 5 MG capsule  Commonly known as:  HYTRIN  Take 1 capsule (5 mg total) by mouth at bedtime.           Follow-up Information    Follow up with Karren Cobble, MD. Schedule an appointment as soon as possible for a visit in 1 month.   Specialty:  Internal Medicine   Contact information:   [address] [place] [address] [phone]       Follow up with [facility], MD. Schedule an appointment as soon as possible for a visit in 1 week.   Specialty:  Cardiology   Contact information:   [place] [address] [phone]       Signed: Birdie Riddle 01/13/2016, 1:37 PM

## 2016-01-14 ENCOUNTER — Other Ambulatory Visit: Payer: Self-pay

## 2016-01-14 NOTE — Patient Outreach (Addendum)
Initial telephonic assessment after patient identified himself using HIPPA identifiers by giving his name, date of birth and address.  Patient and this RNCM collaborated in creating his initial care management care plan.  Plan: Transition of care week #2 on January 20, 2016 Home visit for Heart Failure Education January 27, 2016

## 2016-01-15 ENCOUNTER — Ambulatory Visit (INDEPENDENT_AMBULATORY_CARE_PROVIDER_SITE_OTHER): Payer: Commercial Managed Care - HMO | Admitting: Internal Medicine

## 2016-01-15 ENCOUNTER — Encounter: Payer: Self-pay | Admitting: Internal Medicine

## 2016-01-15 VITALS — BP 160/53 | HR 74 | Temp 97.6°F | Wt 225.4 lb

## 2016-01-15 DIAGNOSIS — D126 Benign neoplasm of colon, unspecified: Secondary | ICD-10-CM

## 2016-01-15 DIAGNOSIS — D509 Iron deficiency anemia, unspecified: Secondary | ICD-10-CM

## 2016-01-15 DIAGNOSIS — F17211 Nicotine dependence, cigarettes, in remission: Secondary | ICD-10-CM

## 2016-01-15 DIAGNOSIS — K2980 Duodenitis without bleeding: Secondary | ICD-10-CM | POA: Diagnosis not present

## 2016-01-15 DIAGNOSIS — K5730 Diverticulosis of large intestine without perforation or abscess without bleeding: Secondary | ICD-10-CM

## 2016-01-15 DIAGNOSIS — I5022 Chronic systolic (congestive) heart failure: Secondary | ICD-10-CM | POA: Diagnosis not present

## 2016-01-15 DIAGNOSIS — Z720 Tobacco use: Secondary | ICD-10-CM

## 2016-01-15 DIAGNOSIS — I110 Hypertensive heart disease with heart failure: Secondary | ICD-10-CM | POA: Diagnosis not present

## 2016-01-15 DIAGNOSIS — Z86718 Personal history of other venous thrombosis and embolism: Secondary | ICD-10-CM

## 2016-01-15 DIAGNOSIS — I10 Essential (primary) hypertension: Secondary | ICD-10-CM

## 2016-01-15 HISTORY — DX: Chronic systolic (congestive) heart failure: I50.22

## 2016-01-15 HISTORY — DX: Diverticulosis of large intestine without perforation or abscess without bleeding: K57.30

## 2016-01-15 MED ORDER — AMLODIPINE BESYLATE 10 MG PO TABS: 10.0000 mg | ORAL_TABLET | Freq: Every day | ORAL | Status: DC

## 2016-01-15 NOTE — Assessment & Plan Note (Signed)
Assessment  During the hospitalization he was found to have a significant iron deficiency anemia. 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 He was started on iron supplementation for which he states he's been compliant. I suspect this is true as he noted that his stools turned black. He has had no blood per rectum or any abdominal pain. He also denies any nausea, vomiting, diarrhea, or constipation despite starting the iron therapy. An upper endoscopy revealed duodenitis which was not felt to be significant and a cause of the anemia. A colonoscopy was also performed and 2 tubular adenomas were removed endoscopically but no other source of bleeding could be found. He therefore underwent a capsule endoscopy and the report is currently unavailable.   Plan  Kevin Castillo was asked to continue the iron sulfate at 325 mg by mouth daily. We will reassess his hemoglobin and iron studies at the follow-up visit after this oral therapy. I suspect his hemoglobin should rebound in the next several weeks and is generalized fatigue should improve.  We will be contacting [facility] to get a copy of that report for our records. In the interim we are holding aspirin therapy in the setting of an iron deficiency anemia and an unclear small bowel assessment.

## 2016-01-15 NOTE — Assessment & Plan Note (Signed)
Assessment  He recently was admitted to the hospital with acute on chronic systolic heart failure. Echocardiogram revealed an ejection fraction of 35%. This was decreased from the previous echocardiogram several years earlier. He was diuresed and his regimen was adjusted. He currently is on metoprolol XL 100 mg by mouth daily and furosemide 80 mg by mouth daily. He was not placed on an ACE inhibitor secondary to his acute on chronic renal failure. Since discharge he notes his breathing has been improved compared to prehospitalization. Last night he was able to sleep flat without any paroxysmal nocturnal dyspnea. This is despite not taking the furosemide since discharge.  Plan  Despite an increase of 5 pounds in weight and not taking the oral Lasix since discharge he is symptomatically improved on the metoprolol XL 100 mg by mouth daily. The importance of picking up the Lasix from the pharmacy was discussed and he was encouraged to start 80 mg by mouth daily in order to keep the fluid off his lungs. He stated that he will. We also discussed the importance of limiting his salt intake. He was reminded to call the clinic if he should develop paroxysmal nocturnal dyspnea as he had prehospitalization so that we could see him immediately and potentially avoid hospitalization. [facility] is following him closely post discharge. I will also see him at my next available appointment to make sure he remains stable. At the follow-up visit if he remains stable on the Toprol-XL and Lasix we will be more aggressive with his afterload reduction if it remains elevated despite the Lasix diuresis.

## 2016-01-15 NOTE — Assessment & Plan Note (Signed)
Assessment  He was found to have duodenitis on upper endoscopy. It was not considered to be significant enough to explain his anemia.  Plan  He was asked to pick up the pantoprazole 40 mg by mouth daily which he has yet to do since discharge. We will also hold the aspirin in the acute setting. We will reassess the hemoglobin at the follow-up visit and it rebounded we'll consider restarting the aspirin at 81 mg by mouth daily in the setting of chronic PPI therapy given the duodenitis seen on endoscopy.

## 2016-01-15 NOTE — Assessment & Plan Note (Signed)
Assessment  Since hospital admission 9 days ago he has quit smoking. He states he's not had any cravings for cigarettes. He is hopeful that he will remain tobacco free from this point forward.  Plan  He was praised for his ability to quit smoking and his desire to remain tobacco free. We will reassess the success of his tobacco cessation efforts at the follow-up visit.

## 2016-01-15 NOTE — Assessment & Plan Note (Signed)
Assessment  During the hospitalization he was found to have to 6 mm semi-pedunculated colonic polyps that were found to be tubular adenomas microscopically.  Plan  He will be due for a surveillance colonoscopy in January 2022.

## 2016-01-15 NOTE — Assessment & Plan Note (Signed)
Assessment  Mr. Goodbar had previously elected to remain on anticoagulation therapy for his previous right lower extremity DVT complicated by a pulmonary emboli. Since he was found to have iron deficiency anemia the Xarelto was discontinued. At this point it is prudent to continue to hold the anticoagulation therapy unless a reversible sources identified and corrected.  Plan  We will continue to monitor clinically for evidence of recurrent venous thrombosis.

## 2016-01-15 NOTE — Assessment & Plan Note (Signed)
Assessment  His blood pressure was slightly elevated today at 160/53. He has not been taking his Lasix for his chronic systolic heart failure and I am concerned that if we were to be overly aggressive in managing his hypertension at this point he may again develop orthostatic hypotension once he does start his diuretic therapy.  Plan  He was strongly encouraged to begin the Lasix at 80 mg by mouth daily given his chronic systolic heart failure. In the meantime, we will continue the metoprolol XL at 100 mg by mouth daily, amlodipine 10 mg by mouth daily, terazosin 5 mg by mouth at bedtime, and start Lasix 80 mg by mouth daily. We will reassess his blood pressure at the follow-up visit. If elevated we will make adjustments in his pharmacologic regimen.

## 2016-01-15 NOTE — Progress Notes (Signed)
   Subjective:    Patient ID: Kevin Castillo, male    DOB: 07/15/1946, 70 y.o.   MRN: [id number]  HPI  Kevin Castillo is here for hospital follow-up after an admission for acute on chronic systolic heart failure and iron deficiency anemia. Please see the A&P for the status of the pt's chronic medical problems.  Review of Systems  Constitutional: Negative for activity change, appetite change and unexpected weight change.  Respiratory: Negative for cough, chest tightness and shortness of breath.   Cardiovascular: Positive for leg swelling. Negative for chest pain and palpitations.       1+ edema to the high ankle bilaterally.  Gastrointestinal: Negative for nausea, vomiting, abdominal pain, diarrhea, constipation, blood in stool and anal bleeding.      Objective:   Physical Exam  Constitutional: He is oriented to person, place, and time. He appears well-developed and well-nourished. No distress.  HENT:  Head: Normocephalic and atraumatic.  Eyes: Conjunctivae are normal. Right eye exhibits no discharge. Left eye exhibits no discharge. No scleral icterus.  Cardiovascular: Normal rate, regular rhythm and normal heart sounds.  Exam reveals no gallop and no friction rub.   No murmur heard. Pulmonary/Chest: Effort normal and breath sounds normal. No respiratory distress. He has no wheezes. He has no rales.  Abdominal: Soft. Bowel sounds are normal. He exhibits no distension. There is no tenderness. There is no rebound and no guarding.  Musculoskeletal: Normal range of motion. He exhibits edema. He exhibits no tenderness.  1+ pitting edema to the high ankle bilaterally  Neurological: He is alert and oriented to person, place, and time. He exhibits normal muscle tone.  Skin: Skin is warm and dry. No rash noted. He is not diaphoretic. No erythema.  Psychiatric: His behavior is normal. Judgment and thought content normal. His affect is blunt. Cognition and memory are normal.  Nursing note and  vitals reviewed.     Assessment & Plan:   Please see problem oriented charting.

## 2016-01-15 NOTE — Patient Instructions (Signed)
It was good to see you again.  I am glad you are feeling better after being in the hospital.  1) Pick up the furosemide (lasix) 80 mg by mouth each morning.  This is a very important medication because it keeps the water out of your lungs.  Water in your lungs was why you became so short of breath and needed to go to the hospital.  2)  Keep taking the iron.  This will help your body make its own blood so you start feeling better.  3) Keep taking the other medications you are.  I did stop the allopurinol as I could not see a reason why you were put on this.  You have not picked it up and I would not pick this one up.  4) Pick up the pantoprazole for your small bowel.  You had some minor irritation and this should help it.  For now hold the Asprin as this could have caused the irritation.  We will likely try the Asprin back up in the future.  I will see you back at my next open appointment.  If you start getting short of breath please call the clinic and we will see you right away to adjust your medications as we try to keep you feeling good and out of the hospital.

## 2016-01-20 ENCOUNTER — Other Ambulatory Visit: Payer: Self-pay

## 2016-01-20 DIAGNOSIS — E784 Other hyperlipidemia: Secondary | ICD-10-CM | POA: Diagnosis not present

## 2016-01-20 DIAGNOSIS — I5022 Chronic systolic (congestive) heart failure: Secondary | ICD-10-CM | POA: Diagnosis not present

## 2016-01-20 DIAGNOSIS — E1140 Type 2 diabetes mellitus with diabetic neuropathy, unspecified: Secondary | ICD-10-CM | POA: Diagnosis not present

## 2016-01-20 DIAGNOSIS — D500 Iron deficiency anemia secondary to blood loss (chronic): Secondary | ICD-10-CM | POA: Diagnosis not present

## 2016-01-20 NOTE — Patient Outreach (Signed)
Telephone call to patient for community care assessment of needs. patient identified himself using HIPPA guidelines, having patient provide date of birth and address.  Patient stated he has an appointment with his heart doctor today was planning to go to his appointment.   Plan: Home visit week of February 6 for HF Education, assessment of needs

## 2016-01-27 ENCOUNTER — Other Ambulatory Visit: Payer: Self-pay

## 2016-01-27 NOTE — Patient Outreach (Addendum)
[facility]) Care Management  01/27/2016  Jabin Kalich 11-15-1946 [id number]    Arrived at patient's home for scheduled home visit for 2pm appointment at 130 pm.   There was no answer at to the doorbell.  This RNCM left her business card on the [place].   Unsuccessful attempt made at 1830 to contact patient to reschedule home visit.  Plan: Make another attempt to contact patient on Friday, February 10 to contact patient via telephone

## 2016-01-29 ENCOUNTER — Other Ambulatory Visit: Payer: Self-pay

## 2016-01-29 NOTE — Patient Outreach (Signed)
This RNCM was successful in making contact with patient to reschedule home visit patient was unavailable for yesterday's visit.  Patient and this RNCM agreed home home visit on February 17 at 2 pm for assessment of community care coordinator.

## 2016-02-05 ENCOUNTER — Other Ambulatory Visit: Payer: Self-pay

## 2016-02-05 ENCOUNTER — Ambulatory Visit: Payer: Commercial Managed Care - HMO

## 2016-02-05 NOTE — Patient Outreach (Signed)
[facility]) Care Management  02/05/2016  Kevin Castillo Nov 10, 1946 [id number]    This RNCM received a call from patient who identified himself by providing date of birth and address. patient stated the purpose of his call was to tell this RNCM he was not going to be available for today's visit. patient requested call next week to reschedule.  Patient stated further he had something to come up and he could not get out of it.    Patient stated he was all right, was not having any problems, just would not be available.  This RNCM agreed to make contact on Monday, February 20 to reschedule appointment.

## 2016-02-08 ENCOUNTER — Other Ambulatory Visit: Payer: Self-pay

## 2016-02-08 NOTE — Patient Outreach (Signed)
This RNCM was successful in making contact with patient via telephone. Patient identified himself by providing date of birth and address  Patient and CM reviewed care plan, update accordingly. Patient and CM agreed on home visit for 2/28. Patient had 2 home visit scheduled for initial home visit, however, he did not answer door for 1st and called to state he was available for 2nd.  Plan: Home visit scheduled for February 28.

## 2016-02-12 DIAGNOSIS — H5210 Myopia, unspecified eye: Secondary | ICD-10-CM | POA: Diagnosis not present

## 2016-02-12 DIAGNOSIS — H524 Presbyopia: Secondary | ICD-10-CM | POA: Diagnosis not present

## 2016-02-16 ENCOUNTER — Encounter: Payer: Self-pay | Admitting: Internal Medicine

## 2016-02-16 ENCOUNTER — Ambulatory Visit (INDEPENDENT_AMBULATORY_CARE_PROVIDER_SITE_OTHER): Payer: Commercial Managed Care - HMO | Admitting: Internal Medicine

## 2016-02-16 ENCOUNTER — Other Ambulatory Visit: Payer: Self-pay

## 2016-02-16 VITALS — BP 168/61 | HR 79 | Temp 99.3°F | Wt 217.4 lb

## 2016-02-16 DIAGNOSIS — I5022 Chronic systolic (congestive) heart failure: Secondary | ICD-10-CM | POA: Diagnosis not present

## 2016-02-16 DIAGNOSIS — I10 Essential (primary) hypertension: Secondary | ICD-10-CM

## 2016-02-16 DIAGNOSIS — I110 Hypertensive heart disease with heart failure: Secondary | ICD-10-CM | POA: Diagnosis not present

## 2016-02-16 DIAGNOSIS — Z0000 Encounter for general adult medical examination without abnormal findings: Secondary | ICD-10-CM

## 2016-02-16 MED ORDER — FUROSEMIDE 80 MG PO TABS: 80.0000 mg | ORAL_TABLET | Freq: Every day | ORAL | Status: DC

## 2016-02-16 NOTE — Progress Notes (Signed)
Patient ID: Kevin Castillo, male   DOB: [birth year], 70 y.o.   MRN: [id number]    Subjective:   Patient ID: Kevin Castillo male   DOB: 1946-05-21 70 y.o.   MRN: [id number]  HPI: [name] is a 70 y.o. pleasant man with past medical history of CAD s/p CABG x 4, PVD, chronic systolic CHF (EF 123456), hypertension, non-insulin Type 2 DM, history of VTE, IDA, hyperlipidemia, diverticulosis, CKD Stage 3, ED, BPH, depression, obesity, alcohol use, and history of tobacco use who presents with shortness of breath.  He reports having shortness of breath while walking yesterday and in the middle of the night with no weight gain, LE edema, or dietary indiscretion. He reports compliance with taking lasix 80 mg daily. He has chronic dry cough but denies fever, chills, or chest pain. His last 2D-echo on 01/07/16 revealed EF 35-40%. He was hospitalized from 1/18-25 for CHF exacerbation. His last chest xray on 01/06/16 revealed small bilateral pleural effusions (R>L) with bibasilar mild pulmonary edema. He quit smoking on 01/06/16. He has an appointment with his cardiologist Dr. Doylene Canard on March 8.     Pt reports compliance with taking norvasc 10 mg daily and terazosin 5 mg daily for hypertension in addition to lasix. He denies headache, blurry vision, chest pain, or lightheadedness.    Past Medical History  Diagnosis Date  . Essential hypertension 02/15/2007  . Hyperlipidemia 02/15/2007  . Tobacco abuse 02/15/2007  . Obesity (BMI 30.0-34.9) 02/15/2007  . Coronary artery disease 02/15/2007    Non-STEMI 07/2005, s/p CABG x 4 (08/01/2005): LIMA to LAD, SVG to OM, RCA, PCA   . Peripheral vascular occlusive disease ([place]) 12/30/2013    ABI (01/03/2014): Right 0.64, Left 0.64   . Venous thromboembolism 11/11/2013    LE Dopplers (01/2011): RLE DVT. CTA with mild subacute to chronic right-sided pulmonary emboli.  Patient has elected to continue anticoagulation.   . Alcohol abuse 02/15/2007    Reports consuming 1 pint of  gin on weekends, does not drink every day. Denies history of seizures, blackouts, or tremors.    . Benign prostatic hypertrophy 06/02/2014  . Allergic rhinitis 04/09/2014  . Adenoma of left adrenal gland 06/16/2014    CT Abdomen (06/04/2014): Incidental, 20 mm, 8.33 HU suggesting benign adenoma   . Major depression, single episode 11/02/2015  . Acute left systolic heart failure ([place]) 01/06/2016  . DVT (deep venous thrombosis) ([place]) 2006    RLE "when I had OHS"  . Erectile dysfunction associated with type 2 diabetes mellitus ([place]) 02/15/2007  . Anemia 11/12/2013    Unclear cause as of yet (? EtOH abuse)   . Chronic kidney disease, stage 3 11/12/2013  . Type 2 diabetes mellitus with stage 3 chronic kidney disease ([place]) 02/15/2007  . Chronic systolic heart failure ([place]) 01/15/2016    Echo (01/07/2016): LVEF 35%  . Tubular adenoma of colon 01/11/2016    Two 6 mm semi-pedunculated tubular adenomas excised endoscopically 01/11/2016.  Repeat colonoscopy due 12/2020.  . Sigmoid diverticulosis 01/15/2016    Minimal per colonoscopy 01/11/2016   Current Outpatient Prescriptions  Medication Sig Dispense Refill  . amLODipine (NORVASC) 10 MG tablet Take 1 tablet (10 mg total) by mouth daily. 90 tablet 3  . atorvastatin (LIPITOR) 20 MG tablet Take 1 tablet (20 mg total) by mouth daily. 90 tablet 3  . ferrous sulfate 325 (65 FE) MG tablet Take 1 tablet (325 mg total) by mouth daily with breakfast. 30 tablet 3  . furosemide (LASIX) 80 MG  tablet Take 1 tablet (80 mg total) by mouth daily. (Patient not taking: Reported on 01/15/2016) 30 tablet 3  . metoprolol succinate (TOPROL-XL) 100 MG 24 hr tablet Take 1 tablet (100 mg total) by mouth daily. 90 tablet 3  . pantoprazole (PROTONIX) 40 MG tablet Take 1 tablet (40 mg total) by mouth daily. (Patient not taking: Reported on 01/15/2016) 30 tablet 3  . terazosin (HYTRIN) 5 MG capsule Take 1 capsule (5 mg total) by mouth at bedtime. 90 capsule 3   No current  facility-administered medications for this visit.   Family History  Problem Relation Age of Onset  . Heart disease Father   . COPD Father   . Diabetes Mellitus II Mother   . Healthy Sister   . Healthy Brother   . Healthy Daughter   . Healthy Son   . Healthy Sister   . Healthy Sister   . Healthy Sister   . Healthy Sister   . Healthy Daughter   . Healthy Son   . Healthy Son   . Healthy Son   . Healthy Son   . Healthy Son   . Healthy Son    Social History   Social History  . Marital Status: Married    Spouse Name: N/A  . Number of Children: N/A  . Years of Education: 10   Occupational History  . Retired     used to drive a truck locally   [place]  . Smoking status: Current Some Day Smoker -- 0.12 packs/day for 55 years    Types: Cigarettes  . Smokeless tobacco: Former Systems developer    Types: Snuff, Chew     Comment: 01/06/2016 "stopped smokeless tobacco in the 1970s"  . Alcohol Use: 6.6 oz/week    0 Standard drinks or equivalent, 11 Shots of liquor per week     Comment: 01/06/2016 Drinks 1 pint of gin over the weekend but does not drink everyday. Denies hx of blackouts, seizures, tremors.  . Drug Use: No  . Sexual Activity: Yes    Birth Control/ Protection: None   Other Topics Concern  . None   Social History Narrative   Married, lives with his wife in [place]. 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 Only one son and one daughter live in [place]. No pets.   Review of Systems: Review of Systems  Constitutional: Negative for fever and chills.       Decreased appetite   Respiratory: Positive for cough (chronic dry) and shortness of breath. Negative for wheezing.   Cardiovascular: Positive for orthopnea and PND. Negative for chest pain, palpitations and leg swelling.  Gastrointestinal: Negative for nausea, vomiting, abdominal pain, diarrhea and constipation.  Genitourinary: Negative for dysuria, urgency and frequency.       Polyuria     Musculoskeletal: Positive for neck pain (stiffness).  Neurological: Negative for dizziness and headaches.     Objective:  Physical Exam: Filed Vitals:   02/16/16 1502 02/16/16 1601  BP: 173/67 168/61  Pulse: 87 79  Temp: 99.3 F (37.4 C)   TempSrc: Oral   Weight: 217 lb 6.4 oz (98.612 kg)   SpO2: 99%     Physical Exam  Constitutional: He is oriented to person, place, and time. He appears well-developed and well-nourished. No distress.  HENT:  Head: Normocephalic and atraumatic.  Right Ear: External ear normal.  Left Ear: External ear normal.  Nose: Nose normal.  Mouth/Throat: Oropharynx is clear and moist. No oropharyngeal  exudate.  Eyes: Conjunctivae and EOM are normal. Pupils are equal, round, and reactive to light. Right eye exhibits no discharge. Left eye exhibits no discharge. No scleral icterus.  Neck: Normal range of motion. Neck supple.  Cardiovascular: Normal rate and regular rhythm.   Pulmonary/Chest: No respiratory distress. He has no wheezes. He has rales (bibasilar L>R).  Abdominal: Soft. He exhibits no distension. There is no tenderness. There is no rebound and no guarding.  Hyperactive bowel sounds  Musculoskeletal: Normal range of motion. He exhibits no edema or tenderness.  Neurological: He is alert and oriented to person, place, and time.  Skin: Skin is warm and dry. No rash noted. He is not diaphoretic. No erythema. No pallor.  Psychiatric: He has a normal mood and affect. His behavior is normal. Judgment and thought content normal.    Assessment & Plan:   Please see problem list for problem-based assessment and plan

## 2016-02-16 NOTE — Assessment & Plan Note (Signed)
Assessment: Pt with chronic systolic CHF (EF 123456) with last 2D-echo on 01/07/16 compliant with medical therapy who presents with acute exacerbation with probable mild pulmonary edema.  Plan:  -SpO2 99% on RA with mild crackles on exam -Wt 217 lb at dry weight  -Continue lasix 80 mg daily and take extra 40 mg daily lasix for the next 3 days or until dyspnea resolves (pt given 10 free samples from our clinic)  -Continue toprol-XL 100 mg daily  -Pt instructed to weight himself daily and limit dietary salt intake -Pt instructed to make earlier appointment with cardiologist Dr. Doylene Canard who he is scheduled to see 02/24/16 -Pt instructed to go to the ED if symptoms worsen

## 2016-02-16 NOTE — Patient Instructions (Signed)
-Start taking extra dose of 40 mg of lasix in addition to the 80 mg daily which I refilled. Take the extra dose for the next 3 days or until your shortness of breath improves. -Weight yourself daily and limit your salt intake -Please make an earlier appt to see Kevin Castillo -Very nice meeting you! Please come back to see Kevin Castillo   Heart Failure Heart failure means your heart has trouble pumping blood. This makes it hard for your body to work well. Heart failure is usually a long-term (chronic) condition. You must take good care of yourself and follow your doctor's treatment plan. HOME CARE  Take your heart medicine as told by your doctor.  Do not stop taking medicine unless your doctor tells you to.  Do not skip any dose of medicine.  Refill your medicines before they run out.  Take other medicines only as told by your doctor or pharmacist.  Stay active if told by your doctor. The elderly and people with severe heart failure should talk with a doctor about physical activity.  Eat heart-healthy foods. Choose foods that are without trans fat and are low in saturated fat, cholesterol, and salt (sodium). This includes fresh or frozen fruits and vegetables, fish, lean meats, fat-free or low-fat dairy foods, whole grains, and high-fiber foods. Lentils and dried peas and beans (legumes) are also good choices.  Limit salt if told by your doctor.  Cook in a healthy way. Roast, grill, broil, bake, poach, steam, or stir-fry foods.  Limit fluids as told by your doctor.  Weigh yourself every morning. Do this after you pee (urinate) and before you eat breakfast. Write down your weight to give to your doctor.  Take your blood pressure and write it down if your doctor tells you to.  Ask your doctor how to check your pulse. Check your pulse as told.  Lose weight if told by your doctor.  Stop smoking or chewing tobacco. Do not use gum or patches that help you quit without your doctor's  approval.  Schedule and go to doctor visits as told.  Nonpregnant women should have no more than 1 drink a day. Men should have no more than 2 drinks a day. Talk to your doctor about drinking alcohol.  Stop illegal drug use.  Stay current with shots (immunizations).  Manage your health conditions as told by your doctor.  Learn to manage your stress.  Rest when you are tired.  If it is really hot outside:  Avoid intense activities.  Use air conditioning or fans, or get in a cooler place.  Avoid caffeine and alcohol.  Wear loose-fitting, lightweight, and light-colored clothing.  If it is really cold outside:  Avoid intense activities.  Layer your clothing.  Wear mittens or gloves, a hat, and a scarf when going outside.  Avoid alcohol.  Learn about heart failure and get support as needed.  Get help to maintain or improve your quality of life and your ability to care for yourself as needed. GET HELP IF:   You gain weight quickly.  You are more short of breath than usual.  You cannot do your normal activities.  You tire easily.  You cough more than normal, especially with activity.  You have any or more puffiness (swelling) in areas such as your hands, feet, ankles, or belly (abdomen).  You cannot sleep because it is hard to breathe.  You feel like your heart is beating fast (palpitations).  You get dizzy or light-headed  when you stand up. GET HELP RIGHT AWAY IF:   You have trouble breathing.  There is a change in mental status, such as becoming less alert or not being able to focus.  You have chest pain or discomfort.  You faint. MAKE SURE YOU:   Understand these instructions.  Will watch your condition.  Will get help right away if you are not doing well or get worse.   This information is not intended to replace advice given to you by your health care provider. Make sure you discuss any questions you have with your health care provider.    Document Released: 09/13/2008 Document Revised: 12/26/2014 Document Reviewed: 01/21/2013 Elsevier Interactive Patient Education 2016 [place] Instructions:   Please bring your medicines with you each time you come to clinic.  Medicines may include prescription medications, over-the-counter medications, herbal remedies, eye drops, vitamins, or other pills.   Progress Toward Treatment Goals:  Treatment Goal 11/02/2015  Hemoglobin A1C at goal  Blood pressure unchanged  Stop smoking smoking less    Self Care Goals & Plans:  Self Care Goal 11/06/2015  Manage my medications take my medicines as prescribed; bring my medications to every visit; refill my medications on time  Monitor my health -  Eat healthy foods drink diet soda or water instead of juice or soda; eat more vegetables; eat foods that are low in salt; eat baked foods instead of fried foods  Be physically active -  Stop smoking -    Home Blood Glucose Monitoring 11/02/2015  Check my blood sugar no home glucose monitoring  When to check my blood sugar N/A     Care Management & Community Referrals:  Referral 11/02/2015  Referrals made for care management support none needed  Referrals made to community resources none

## 2016-02-16 NOTE — Assessment & Plan Note (Signed)
Inquire regarding zoster vaccination at next visit

## 2016-02-16 NOTE — Assessment & Plan Note (Addendum)
Assessment: Pt with moderately controlled hypertension compliant with four-class (BB, CCB, alpha-1 blocker, & diuretic) anti-hypertensive therapy who presents with blood pressure of 168/61.  Plan:  -BP 168/61 not at goal <140/90 -Continue norvasc 10 mg daily, lasix 80 mg daily (extra 40 mg for next 3 days), and terazosin 5 mg daily  -Renal function on 01/11/16 with CKD Stage 4, repeat at next visit  -Consider adding bidil if continues to be uncontrolled

## 2016-02-17 NOTE — Patient Outreach (Signed)
[facility]) Care Management  February 16, 2016   Kevin Castillo 02/19/1946 [id number]  This RNCM arrived at patient's home for Heart Failure Education. Patient appeared tired, stated he did not sleep the night before because he was short of breath. Patient stated his weight has remained the same 2 days in a row.  Vital signs taken and recorded. This RNCM made call to [facility], made same day appointment for follow up. Patient's wife was at home and agreed to transport patient. Patient denies need for emergency transportation.  This RNCM advised patient to call 911 if his shortness of breath increases, he gets dizzy or feel faint or develop chest pain.   Plan: Make telephone contact on February 18, 2016 to schedule home visit for Heart Failure Education.

## 2016-02-18 ENCOUNTER — Other Ambulatory Visit: Payer: Self-pay

## 2016-02-18 NOTE — Patient Outreach (Signed)
Call made to patient to follow up with same day appointment made for his described symptoms of shortness of breath, fatigue and inability to sleep.  Patient answered telephone, confirmed HIPPA identifiers by providing date of birth and address.  Patient reports feeling much better, has not noted any increase swelling and is breathing easier.  This RNCM and patient agreed to home visit next week on March 9

## 2016-02-20 NOTE — Progress Notes (Signed)
Internal Medicine Clinic Attending  Case discussed with Dr. Rabbani soon after the resident saw the patient.  We reviewed the resident's history and exam and pertinent patient test results.  I agree with the assessment, diagnosis, and plan of care documented in the resident's note.  

## 2016-02-25 ENCOUNTER — Other Ambulatory Visit: Payer: Self-pay

## 2016-02-25 NOTE — Patient Outreach (Signed)
[facility]) Care Management  02/25/2016  Claud Ishee Jul 24, 1946 [id number]   This RNCM  Received a call from Mr. Dowsett who identiifed himself by providng his date of birth and address. Mr. Orton advised that he would not be available for today's home visit, requested a call next week to reschedule. This RNCM agreed to call on Monday, March 13 to reschedule.   Plan: Telephone contact on Monday, March 13 to reschedule home visit.

## 2016-03-01 DIAGNOSIS — E1140 Type 2 diabetes mellitus with diabetic neuropathy, unspecified: Secondary | ICD-10-CM | POA: Diagnosis not present

## 2016-03-01 DIAGNOSIS — I10 Essential (primary) hypertension: Secondary | ICD-10-CM | POA: Diagnosis not present

## 2016-03-01 DIAGNOSIS — I5022 Chronic systolic (congestive) heart failure: Secondary | ICD-10-CM | POA: Diagnosis not present

## 2016-03-01 DIAGNOSIS — D500 Iron deficiency anemia secondary to blood loss (chronic): Secondary | ICD-10-CM | POA: Diagnosis not present

## 2016-03-01 DIAGNOSIS — E784 Other hyperlipidemia: Secondary | ICD-10-CM | POA: Diagnosis not present

## 2016-03-10 ENCOUNTER — Other Ambulatory Visit: Payer: Self-pay

## 2016-03-10 NOTE — Patient Outreach (Signed)
RNCM made telephone contact with patient for community care coordination. Patient identified himself by providing his name and address and date of birth.    Patient and this RNCM reviewed his case management care goals. Patient denies need for further case management assistance at this time. patient states he learned a lot about heart failure.  patient agrees to discharge and states understanding to call our main office number or my number if further case management assistance is needed.    Plan: Discharge patient by sending discharge letter and letter to primary care physician.

## 2016-03-21 IMAGING — US US RENAL
1 series · 14 of 25 positions shown · non-contrast
Comparison: Ultrasound February 09, 2011.

CLINICAL DATA: Chronic kidney disease stage 3.

EXAM:
RENAL / URINARY TRACT ULTRASOUND COMPLETE

[Series 1: us renal · 0.25mm/px · 14 of 47 slices shown]
[im 1/47]
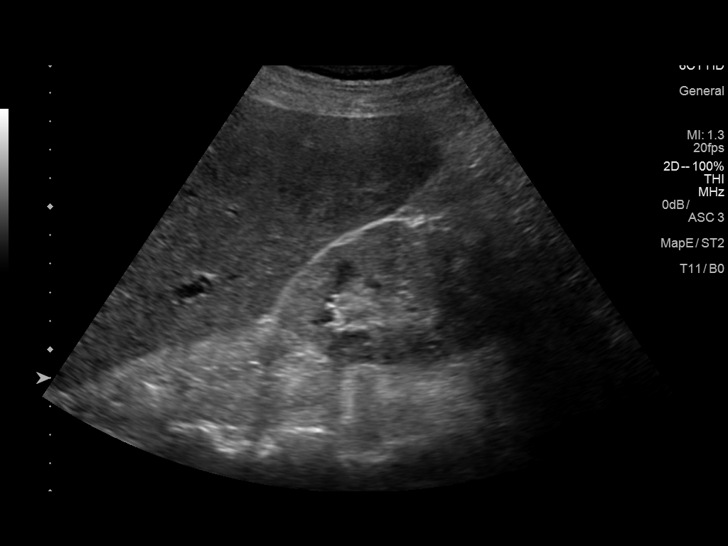
[im 4/47]
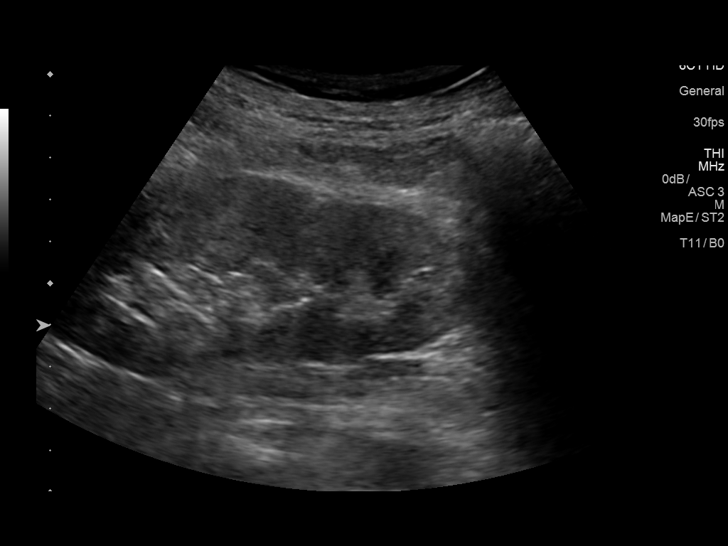
[im 8/47]
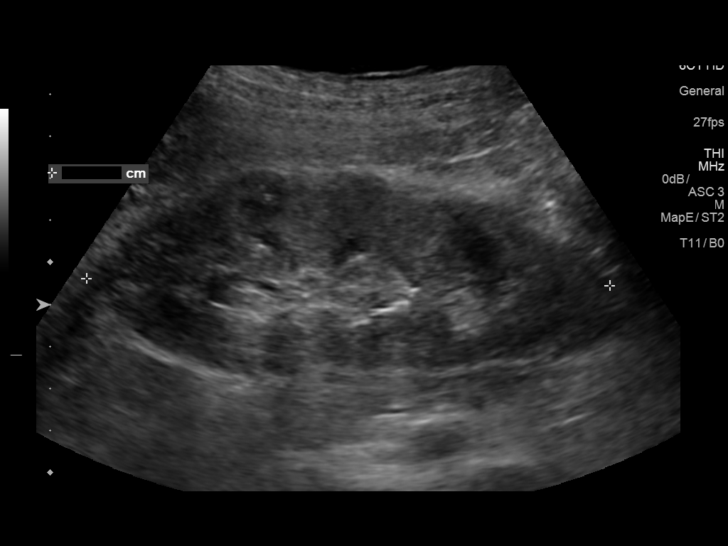
[im 12/47]
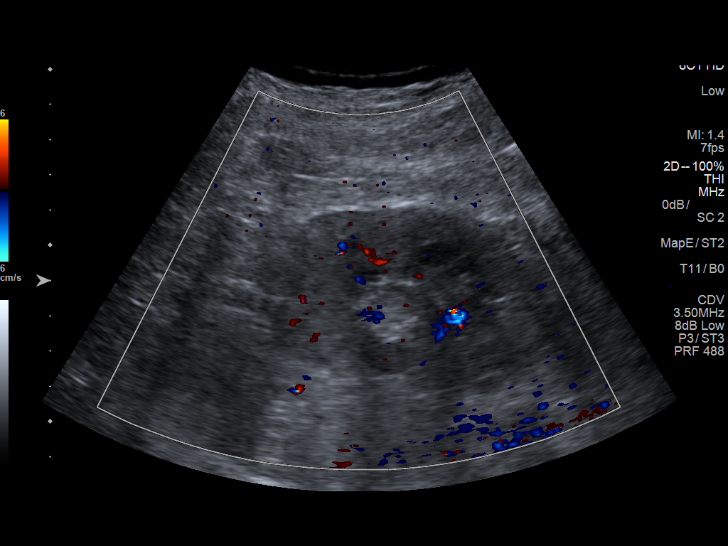
[im 16/47]
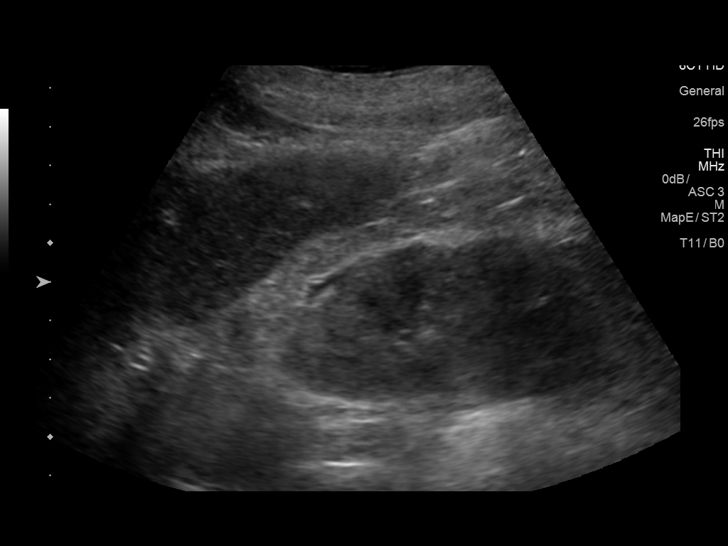
[im 18/47]
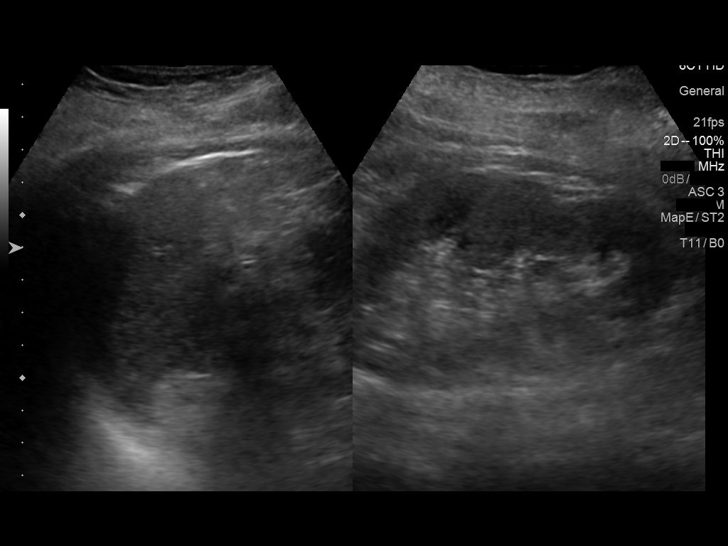
[im 22/47]
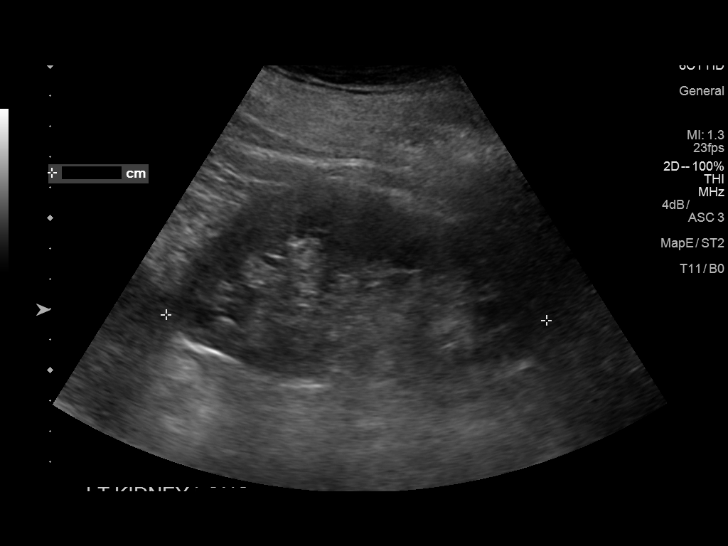
[im 25/47]
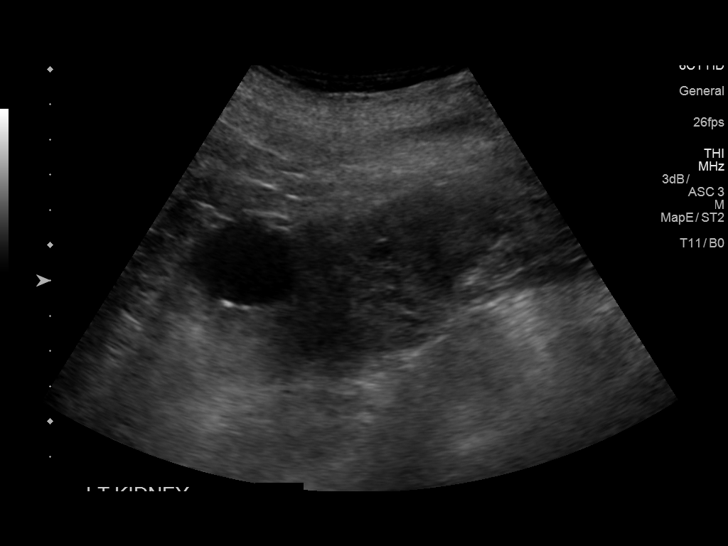
[im 29/47]
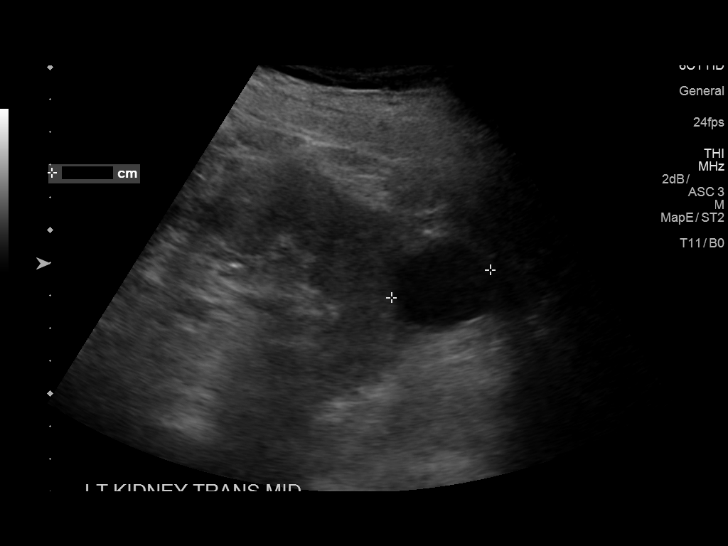
[im 31/47]
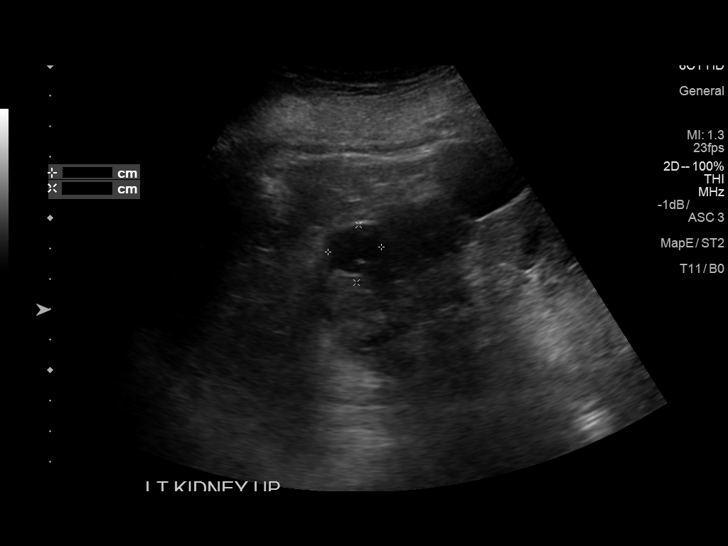
[im 35/47]
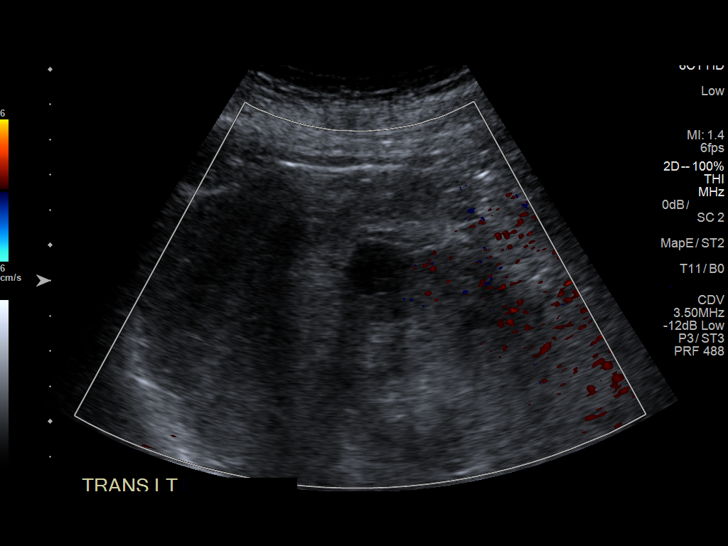
[im 39/47]
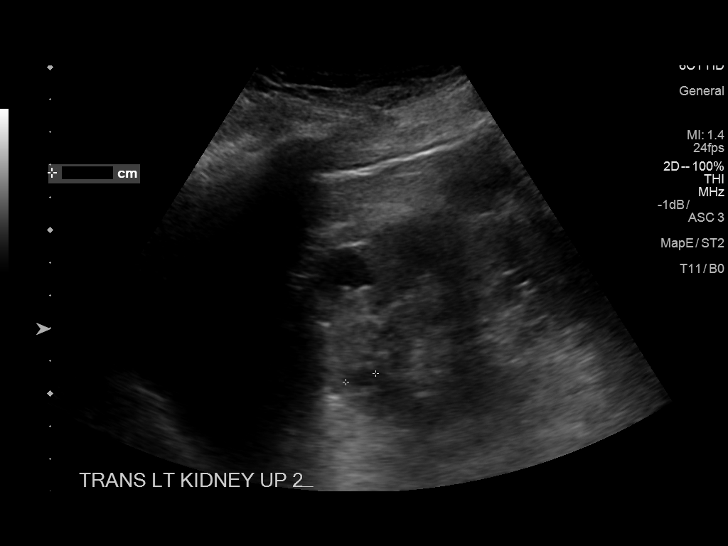
[im 43/47]
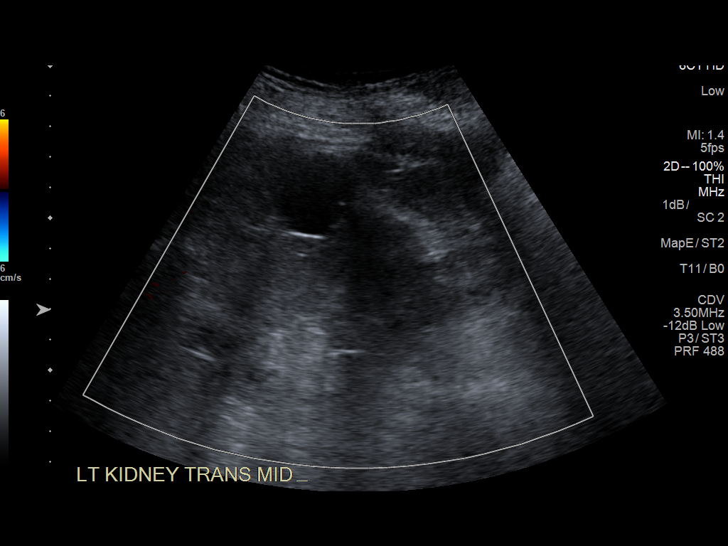
[im 47/47]
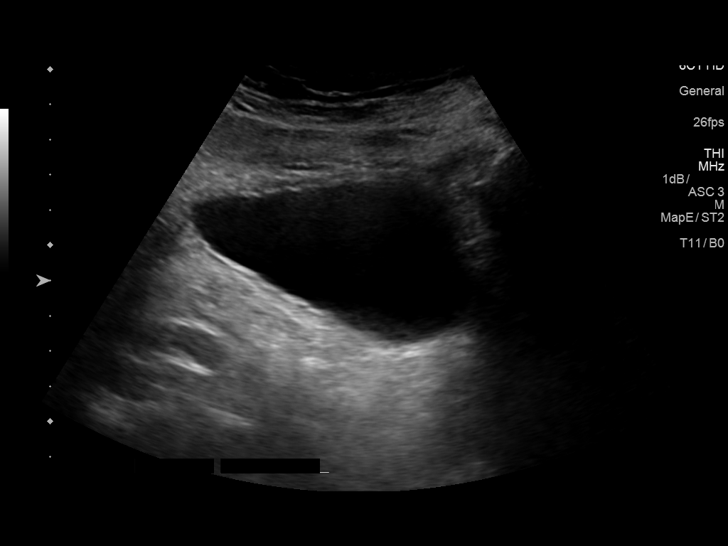

[14 of 25 positions shown; findings below may reference images not displayed]

FINDINGS: Right Kidney:

Length: 12.4 cm. Slightly increased echogenicity is noted suggesting
medical renal disease. No mass or hydronephrosis visualized.

Left Kidney:

Length: 12.5 cm. 3.2 cm simple cyst arises from midpole. 1.9 cm cyst
arises from upper pole. 1 cm cyst is seen in upper pole. Slightly
increased echogenicity is noted suggesting medical renal disease. No
mass or hydronephrosis visualized.

Bladder:

Appears normal for degree of bladder distention.
IMPRESSION: Multiple simple left renal cysts. Increased echogenicity of renal
parenchyma bilaterally suggesting medical renal disease. No
hydronephrosis or renal obstruction is noted.

## 2016-03-25 ENCOUNTER — Ambulatory Visit (INDEPENDENT_AMBULATORY_CARE_PROVIDER_SITE_OTHER): Payer: Commercial Managed Care - HMO | Admitting: Internal Medicine

## 2016-03-25 ENCOUNTER — Encounter: Payer: Self-pay | Admitting: Internal Medicine

## 2016-03-25 VITALS — BP 169/80 | HR 71 | Temp 98.7°F | Wt 224.1 lb

## 2016-03-25 DIAGNOSIS — E1122 Type 2 diabetes mellitus with diabetic chronic kidney disease: Secondary | ICD-10-CM | POA: Diagnosis not present

## 2016-03-25 DIAGNOSIS — I10 Essential (primary) hypertension: Secondary | ICD-10-CM

## 2016-03-25 DIAGNOSIS — F17200 Nicotine dependence, unspecified, uncomplicated: Secondary | ICD-10-CM

## 2016-03-25 DIAGNOSIS — E1151 Type 2 diabetes mellitus with diabetic peripheral angiopathy without gangrene: Secondary | ICD-10-CM | POA: Diagnosis not present

## 2016-03-25 DIAGNOSIS — N183 Chronic kidney disease, stage 3 (moderate): Secondary | ICD-10-CM | POA: Diagnosis not present

## 2016-03-25 DIAGNOSIS — Z720 Tobacco use: Secondary | ICD-10-CM

## 2016-03-25 DIAGNOSIS — D649 Anemia, unspecified: Secondary | ICD-10-CM | POA: Diagnosis not present

## 2016-03-25 DIAGNOSIS — I25719 Atherosclerosis of autologous vein coronary artery bypass graft(s) with unspecified angina pectoris: Secondary | ICD-10-CM

## 2016-03-25 DIAGNOSIS — I739 Peripheral vascular disease, unspecified: Secondary | ICD-10-CM

## 2016-03-25 DIAGNOSIS — F324 Major depressive disorder, single episode, in partial remission: Secondary | ICD-10-CM

## 2016-03-25 DIAGNOSIS — E785 Hyperlipidemia, unspecified: Secondary | ICD-10-CM | POA: Diagnosis not present

## 2016-03-25 DIAGNOSIS — Z79899 Other long term (current) drug therapy: Secondary | ICD-10-CM

## 2016-03-25 DIAGNOSIS — E876 Hypokalemia: Secondary | ICD-10-CM | POA: Diagnosis not present

## 2016-03-25 DIAGNOSIS — N184 Chronic kidney disease, stage 4 (severe): Secondary | ICD-10-CM | POA: Diagnosis not present

## 2016-03-25 DIAGNOSIS — I5022 Chronic systolic (congestive) heart failure: Secondary | ICD-10-CM

## 2016-03-25 DIAGNOSIS — D509 Iron deficiency anemia, unspecified: Secondary | ICD-10-CM | POA: Diagnosis not present

## 2016-03-25 DIAGNOSIS — I130 Hypertensive heart and chronic kidney disease with heart failure and stage 1 through stage 4 chronic kidney disease, or unspecified chronic kidney disease: Secondary | ICD-10-CM | POA: Diagnosis not present

## 2016-03-25 DIAGNOSIS — F329 Major depressive disorder, single episode, unspecified: Secondary | ICD-10-CM

## 2016-03-25 DIAGNOSIS — R269 Unspecified abnormalities of gait and mobility: Secondary | ICD-10-CM

## 2016-03-25 LAB — GLUCOSE, CAPILLARY: Glucose-Capillary: 151 mg/dL — ABNORMAL HIGH (ref 65–99)

## 2016-03-25 LAB — POCT GLYCOSYLATED HEMOGLOBIN (HGB A1C): Hemoglobin A1C: 6.2

## 2016-03-25 MED ORDER — PANTOPRAZOLE SODIUM 40 MG PO TBEC: 40.0000 mg | DELAYED_RELEASE_TABLET | Freq: Every day | ORAL | Status: DC

## 2016-03-25 MED ORDER — FERROUS SULFATE 325 (65 FE) MG PO TABS: 325.0000 mg | ORAL_TABLET | Freq: Every day | ORAL | Status: DC

## 2016-03-25 MED ORDER — FUROSEMIDE 80 MG PO TABS: 80.0000 mg | ORAL_TABLET | Freq: Every day | ORAL | Status: DC

## 2016-03-25 NOTE — Patient Instructions (Signed)
It was good to see you again.  I am sorry about all of your friends dying recently.  Things will start getting better with some time.  1) Keep taking your medications as you are.  2) We checked your blood today to see if your kidneys are working well.  I will call you if there is a problem or if we need to add a medication for your heart failure.  3) We arranged for you to get a cane.  I will see you back in 3 months, sooner if necessary.

## 2016-03-26 DIAGNOSIS — R269 Unspecified abnormalities of gait and mobility: Secondary | ICD-10-CM | POA: Insufficient documentation

## 2016-03-26 LAB — BMP8+ANION GAP
Anion Gap: 18 mmol/L (ref 10.0–18.0)
BUN / CREAT RATIO: 12 (ref 10–24)
BUN: 31 mg/dL — ABNORMAL HIGH (ref 8–27)
CHLORIDE: 99 mmol/L (ref 96–106)
CO2: 24 mmol/L (ref 18–29)
Calcium: 9.1 mg/dL (ref 8.6–10.2)
Creatinine, Ser: 2.59 mg/dL — ABNORMAL HIGH (ref 0.76–1.27)
GFR calc non Af Amer: 24 mL/min/{1.73_m2} — ABNORMAL LOW (ref >59)
GFR, EST AFRICAN AMERICAN: 28 mL/min/{1.73_m2} — AB (ref >59)
GLUCOSE: 138 mg/dL — AB (ref 65–99)
POTASSIUM: 3.9 mmol/L (ref 3.5–5.2)
SODIUM: 141 mmol/L (ref 134–144)

## 2016-03-26 LAB — FERRITIN: FERRITIN: 22 ng/mL — AB (ref 30–400)

## 2016-03-26 MED ORDER — LISINOPRIL 10 MG PO TABS: 10.0000 mg | ORAL_TABLET | Freq: Every day | ORAL | Status: DC

## 2016-03-26 NOTE — Assessment & Plan Note (Signed)
Assessment  Mild symptomatic decompensation related to poorly controlled afterload.  Plan  Continue the metoprolol XL 100 mg daily, lasix 80 mg daily, and restarting the lisinopril 10 mg daily.  If he does not tolerate the reinstitution of the ACEI we will consider starting hydralazine and Imdur for both the chronic systolic heart failure and essential hypertension.

## 2016-03-26 NOTE — Assessment & Plan Note (Signed)
Assessment  His diabetes is well controlled with diet only based on his Hgb A1C of 6.2.  His renal function is essentially unchanged with an eGFR of 28.  Plan  He was praised on his excellent diet control with diet alone and was asked to continue with his current diet.  Although there is some question as to the benefit of ACEI in stage 4 disease this will be restarted given his concomitant essential hypertension not at goal and the need to more aggressively address it to preserve his kidney function as long as possible. 

## 2016-03-26 NOTE — Assessment & Plan Note (Signed)
Assessment  He has now progressed to stage 4 chronic kidney disease with several readings with eGFRs < 30.  Plan  We will try to more aggressively control his hypertension with reinitiation of ACEI in the setting of chronic systolic heart failure.  This will be slowly titrated as needed and tolerated and we will start lisinopril at 10 mg daily.  We will reassess his eGFR at the follow-up visit to assure it is stable with the reinitiation of the ACEI.

## 2016-03-26 NOTE — Assessment & Plan Note (Signed)
Assessment  He has had no chest pain on the amlodipine and metoprolol XL.  The ASA was stopped when he was found to have the iron deficiency anemia.  Plan  We will continue the metoprolol XL and amlodipine.  If his iron deficiency anemia has resolved by the follow-up visit we will restart the ASA at 81 mg daily.

## 2016-03-26 NOTE — Progress Notes (Signed)
   Subjective:    Patient ID: Kevin Castillo, male    DOB: [birth year], 70 y.o.   MRN: [id number]  HPI  Kevin Castillo is here for follow-up of his chronic systolic heart failure, chronic renal failure, essential hypertension, and iron deficiency anemia. Please see the A&P for the status of the pt's chronic medical problems.  Review of Systems  Constitutional: Negative for activity change.  Respiratory: Positive for shortness of breath. Negative for chest tightness and wheezing.        Dyspnea on exertion at several hundred feet.  Occasional PND but denies orthopnea.  Cardiovascular: Negative for chest pain, palpitations and leg swelling.  Gastrointestinal: Negative for nausea, vomiting, abdominal pain, diarrhea, constipation and abdominal distention.  Psychiatric/Behavioral: Positive for dysphoric mood.      Objective:   Physical Exam  Constitutional: He is oriented to person, place, and time. He appears well-developed and well-nourished. No distress.  HENT:  Head: Normocephalic and atraumatic.  Cardiovascular: Normal rate, regular rhythm and normal heart sounds.  Exam reveals no gallop and no friction rub.   No murmur heard. Pulmonary/Chest: Effort normal. No respiratory distress. He has no wheezes. He has no rales.  Right basilar inspiratory crackles.  Left base w/o crackles.  Abdominal: Soft. Bowel sounds are normal. He exhibits no distension. There is no tenderness. There is no rebound and no guarding.  Musculoskeletal: Normal range of motion. He exhibits edema. He exhibits no tenderness.  Trace edema, improved from previous visit with me.  Neurological: He is alert and oriented to person, place, and time. He exhibits normal muscle tone. Coordination normal.  Skin: Skin is warm and dry. No rash noted. He is not diaphoretic. No erythema.  Psychiatric: He has a normal mood and affect. His behavior is normal. Judgment and thought content normal.  Nursing note and vitals reviewed.     Assessment & Plan:   Please see problem oriented charting.

## 2016-03-26 NOTE — Assessment & Plan Note (Signed)
Assessment  Currently clinically asymptomatic at this time.  Plan  We will continue aggressive risk factor modification including therapy for his hypertension, hyperlipidemia, diabetes, and counseling encouraging tobacco cessation.  If his iron deficiency anemia has resolved by the follow-up visit we will restart ASA 81 mg daily.

## 2016-03-26 NOTE — Assessment & Plan Note (Signed)
We will readdress his interest in the zostavax at the follow-up visit.  He is otherwise up to date on his healthcare maintenance.

## 2016-03-26 NOTE — Assessment & Plan Note (Signed)
Assessment  It is unclear if this is related to his prior episode of major depression which symptomatically responded well to the bupropion or related to recent deaths within his family and friends, and thus representing more of a grief reaction.  Plan  Bupropion discontinued during his admission for heart failure.  I discussed therapy at this time and we both decided to hold of on reinitiation of bupropion and observe his response to the recent losses.  If the grief persists he may benefit from reinitiation of the bupropion and this can be started at the follow-up visit.  We will let me know sooner if he would like to restart the bupropion.

## 2016-03-26 NOTE — Assessment & Plan Note (Signed)
Assessment  Blood pressure remains above target for his renal disease and systolic heart failure.  At this visit it was 169/80 on amlodipine 10 mg daily, metoprolol XL 100 mg daily, terazosin 5 mg each night, and lasix 80 mg daily.  Plan  We will continue the metoprolol XL, amlodipine, terazosin, and lasix at the current doses.  We will start lisinopril 10 mg daily and titrate as necessary and tolerated.  We will reassess the blood pressure at the follow-up visit.

## 2016-03-26 NOTE — Assessment & Plan Note (Signed)
Assessment  He notes a sensation that his left knee may give way.  Assessment of his gait was unremarkable in clinic.  Plan  Asked patient to purchase a cane OTC so as to preserve the insurance benefit for a walker should he require one within the next 5 years.  We will reassess his sensation of knee give way at the follow-up visit and proceed with further evaluation should it persist.

## 2016-03-26 NOTE — Assessment & Plan Note (Signed)
Assessment  He has restarted smoking.  Plan  He was given some smoking cessation materials.  If he is interested in pharmacologic intervention as well this can be started at the follow-up visit if necessary and desired.

## 2016-03-26 NOTE — Assessment & Plan Note (Signed)
Assessment  He is tolerating the atorvastatin 10 mg daily without myalgias.  Plan  We will continue the high intensity statin and reassess for intolerances at the follow-up visit.

## 2016-03-26 NOTE — Assessment & Plan Note (Signed)
Assessment  Iron deficiency of unclear etiology despite GI evaluation persists but is improved with iron therapy.  Ferritin today was 22.  Plan  Will continue to hold the ASA as well as continue iron supplementation and PPI therapy.  We will reassess the iron deficiency anemia at the follow-up visit with a repeat ferritin and a CBC.

## 2016-03-30 ENCOUNTER — Other Ambulatory Visit: Payer: Self-pay | Admitting: Internal Medicine

## 2016-03-30 DIAGNOSIS — I10 Essential (primary) hypertension: Secondary | ICD-10-CM

## 2016-04-05 DIAGNOSIS — D500 Iron deficiency anemia secondary to blood loss (chronic): Secondary | ICD-10-CM | POA: Diagnosis not present

## 2016-04-05 DIAGNOSIS — I5022 Chronic systolic (congestive) heart failure: Secondary | ICD-10-CM | POA: Diagnosis not present

## 2016-04-05 DIAGNOSIS — I10 Essential (primary) hypertension: Secondary | ICD-10-CM | POA: Diagnosis not present

## 2016-04-05 DIAGNOSIS — E784 Other hyperlipidemia: Secondary | ICD-10-CM | POA: Diagnosis not present

## 2016-04-05 DIAGNOSIS — E1140 Type 2 diabetes mellitus with diabetic neuropathy, unspecified: Secondary | ICD-10-CM | POA: Diagnosis not present

## 2016-07-26 ENCOUNTER — Other Ambulatory Visit: Payer: Self-pay | Admitting: Internal Medicine

## 2016-07-26 DIAGNOSIS — N400 Enlarged prostate without lower urinary tract symptoms: Secondary | ICD-10-CM

## 2016-07-26 DIAGNOSIS — I10 Essential (primary) hypertension: Secondary | ICD-10-CM

## 2016-07-26 DIAGNOSIS — E785 Hyperlipidemia, unspecified: Secondary | ICD-10-CM

## 2016-08-05 ENCOUNTER — Ambulatory Visit (INDEPENDENT_AMBULATORY_CARE_PROVIDER_SITE_OTHER): Payer: Commercial Managed Care - HMO | Admitting: Internal Medicine

## 2016-08-05 ENCOUNTER — Encounter: Payer: Self-pay | Admitting: Internal Medicine

## 2016-08-05 VITALS — BP 157/67 | HR 60 | Wt 231.1 lb

## 2016-08-05 DIAGNOSIS — F17210 Nicotine dependence, cigarettes, uncomplicated: Secondary | ICD-10-CM

## 2016-08-05 DIAGNOSIS — E785 Hyperlipidemia, unspecified: Secondary | ICD-10-CM

## 2016-08-05 DIAGNOSIS — I130 Hypertensive heart and chronic kidney disease with heart failure and stage 1 through stage 4 chronic kidney disease, or unspecified chronic kidney disease: Secondary | ICD-10-CM | POA: Diagnosis not present

## 2016-08-05 DIAGNOSIS — E1151 Type 2 diabetes mellitus with diabetic peripheral angiopathy without gangrene: Secondary | ICD-10-CM | POA: Diagnosis not present

## 2016-08-05 DIAGNOSIS — I5022 Chronic systolic (congestive) heart failure: Secondary | ICD-10-CM

## 2016-08-05 DIAGNOSIS — Z79899 Other long term (current) drug therapy: Secondary | ICD-10-CM

## 2016-08-05 DIAGNOSIS — E1122 Type 2 diabetes mellitus with diabetic chronic kidney disease: Secondary | ICD-10-CM | POA: Diagnosis not present

## 2016-08-05 DIAGNOSIS — F1010 Alcohol abuse, uncomplicated: Secondary | ICD-10-CM

## 2016-08-05 DIAGNOSIS — D509 Iron deficiency anemia, unspecified: Secondary | ICD-10-CM | POA: Diagnosis not present

## 2016-08-05 DIAGNOSIS — I25719 Atherosclerosis of autologous vein coronary artery bypass graft(s) with unspecified angina pectoris: Secondary | ICD-10-CM

## 2016-08-05 DIAGNOSIS — N184 Chronic kidney disease, stage 4 (severe): Secondary | ICD-10-CM

## 2016-08-05 DIAGNOSIS — N400 Enlarged prostate without lower urinary tract symptoms: Secondary | ICD-10-CM

## 2016-08-05 DIAGNOSIS — I10 Essential (primary) hypertension: Secondary | ICD-10-CM

## 2016-08-05 DIAGNOSIS — F1020 Alcohol dependence, uncomplicated: Secondary | ICD-10-CM

## 2016-08-05 DIAGNOSIS — I739 Peripheral vascular disease, unspecified: Secondary | ICD-10-CM

## 2016-08-05 DIAGNOSIS — Z720 Tobacco use: Secondary | ICD-10-CM

## 2016-08-05 DIAGNOSIS — I25119 Atherosclerotic heart disease of native coronary artery with unspecified angina pectoris: Secondary | ICD-10-CM

## 2016-08-05 LAB — GLUCOSE, CAPILLARY: GLUCOSE-CAPILLARY: 130 mg/dL — AB (ref 65–99)

## 2016-08-05 LAB — POCT GLYCOSYLATED HEMOGLOBIN (HGB A1C): HEMOGLOBIN A1C: 5.9

## 2016-08-05 MED ORDER — LISINOPRIL 10 MG PO TABS: 10.0000 mg | ORAL_TABLET | Freq: Every day | ORAL | 11 refills | Status: DC

## 2016-08-05 MED ORDER — FERROUS SULFATE 325 (65 FE) MG PO TABS: 325.0000 mg | ORAL_TABLET | Freq: Every day | ORAL | 3 refills | Status: DC

## 2016-08-05 MED ORDER — FUROSEMIDE 80 MG PO TABS: 80.0000 mg | ORAL_TABLET | Freq: Every day | ORAL | 3 refills | Status: DC

## 2016-08-05 MED ORDER — PANTOPRAZOLE SODIUM 40 MG PO TBEC: 40.0000 mg | DELAYED_RELEASE_TABLET | Freq: Every day | ORAL | 3 refills | Status: DC

## 2016-08-05 NOTE — Assessment & Plan Note (Signed)
Assessment  He claims to be cutting down on his alcohol. He stated he had a few drinks last week but has not had any alcoholic drinks this week.  Plan  We will continue to address his alcohol abuse at each visit and assess his progress towards abstinence.

## 2016-08-05 NOTE — Assessment & Plan Note (Addendum)
Assessment  His diabetes is very well controlled with diet alone. Today's hemoglobin A1c was 5.9. This is down from 6.2 at the last visit. A basic metabolic panel was drawn today to assess his kidney function. It is pending at the time of this dictation.  Plan  He was praised for his excellent diabetic control with diet alone and encouraged to continue with his current diet. We will reassess the control of his diabetes at the follow-up visit with a repeat hemoglobin A1c. We will also assess the degree of his renal dysfunction when the basic metabolic panel is resulted. A foot exam was done today and reveals very long nails. Therefore an ambulatory referral for podiatry was made. He states he will personally schedule the needed eye examination. He is otherwise up-to-date on his diabetic health care maintenance.

## 2016-08-05 NOTE — Assessment & Plan Note (Signed)
Assessment  His lower extremities are much more edematous than at baseline. Surprisingly he is without any rales but does admit to some shortness of breath with exertion. He never restarted the lisinopril and continues to have an elevated afterload. In addition, he ran out of his Lasix 3 weeks ago. His weight is up 7 pounds from the last visit suggesting a mild decompensation in his systolic heart failure.  Plan   He is switching to a mail order pharmacy and both the lisinopril and Lasix prescriptions were sent to his new pharmacy. I am hopeful that with Lasix diuresis, as well as reduce to afterload, we may once again get ahead of his systolic heart failure. I believe this would help his dyspnea on exertion and his overall fatigue, assuming he is not anemic at this time. We are also continuing his metoprolol XL 100 mg by mouth daily.

## 2016-08-05 NOTE — Assessment & Plan Note (Signed)
Assessment  He is tolerating the atorvastatin 20 mg by mouth daily without any myalgias.  Plan  We will continue the atorvastatin 20 mg by mouth daily and reassess for symptoms at the follow-up visit.

## 2016-08-05 NOTE — Progress Notes (Signed)
   Subjective:    Patient ID: Kevin Castillo, male    DOB: 1946-10-13, 70 y.o.   MRN: [id number]  HPI  Prynce Barrozo is here for follow-up of his chronic systolic heart failure, chronic renal failure, essential hypertension, and iron deficiency. Please see the A&P for the status of the pt's chronic medical problems.  Review of Systems  Constitutional: Positive for fatigue. Negative for appetite change.  Respiratory: Positive for shortness of breath.   Cardiovascular: Positive for leg swelling. Negative for chest pain and palpitations.  Gastrointestinal: Positive for constipation. Negative for diarrhea, nausea and vomiting.      Objective:   Physical Exam  Constitutional: He is oriented to person, place, and time. He appears well-developed and well-nourished. No distress.  HENT:  Head: Normocephalic and atraumatic.  Eyes: Conjunctivae are normal. Right eye exhibits no discharge. Left eye exhibits no discharge. No scleral icterus.  Cardiovascular: Normal rate, regular rhythm and normal heart sounds.  Exam reveals no gallop and no friction rub.   No murmur heard. Pulmonary/Chest: Effort normal and breath sounds normal. No respiratory distress. He has no wheezes. He has no rales.  Abdominal: Soft. Bowel sounds are normal. He exhibits no distension. There is no tenderness. There is no rebound and no guarding.  Musculoskeletal: Normal range of motion. He exhibits edema. He exhibits no tenderness.  Neurological: He is alert and oriented to person, place, and time. He exhibits normal muscle tone.  Skin: Skin is warm and dry. No rash noted. He is not diaphoretic. No erythema.  Psychiatric: Judgment and thought content normal. His affect is blunt. His speech is delayed. He is slowed. Cognition and memory are normal.  Nursing note and vitals reviewed.     Assessment & Plan:   Please see problem oriented charting.

## 2016-08-05 NOTE — Assessment & Plan Note (Signed)
Assessment  His blood pressure was above target today 157/67. He admits that he never restarted the lisinopril 10 mg by mouth daily and ran out of his Lasix 3 weeks ago. His other medications are amlodipine 10 mg by mouth daily, metoprolol XL 100 mg by mouth daily, and Terazosin 5 mg by mouth each night.  Plan  The Lasix prescription was sent to his new mail order pharmacy. The same is true for his lisinopril at 10 mg by mouth daily. He was encouraged to continue the amlodipine at 10 mg by mouth daily, metoprolol XL 100 mg by mouth daily, and terazosin 5 mg by mouth each night. We will reassess his blood pressure control, hopefully with improved compliance of the prescribed medications, at the follow-up visit.

## 2016-08-05 NOTE — Assessment & Plan Note (Signed)
Assessment  He is without complaints on the terazosin 5 mg by mouth daily.  Plan  We will continue with the terazosin at 5 mg by mouth daily and reassess his symptoms of bladder outlet obstruction at the follow-up visit.

## 2016-08-05 NOTE — Assessment & Plan Note (Signed)
Assessment  He continues to have claudication with ambulation bilaterally.  Plan  We are aggressively treating his diabetes, hypertension, hyperlipidemia, and tobacco abuse. We are hopeful that we can reinstitute antiplatelet therapy, specifically aspirin in the near future. I'm hesitant to consider cilostazol given his heart failure. If his symptoms progress we will consider repeat ankle brachial indexes.

## 2016-08-05 NOTE — Assessment & Plan Note (Signed)
Assessment  He has had no chest pain on the amlodipine 10 mg by mouth daily and metoprolol XL 100 mg by mouth daily. We have been holding his aspirin given his recent GI bleed until we are assured his iron deficiency has resolved. He is also tolerating the atorvastatin at 20 mg by mouth daily.  Plan  A CBC was drawn at this visit and is pending at the time of this dictation. If he no longer has an iron deficiency anemia we will restart the aspirin at 81 mg by mouth daily. We are also restarting the lisinopril at 10 mg by mouth daily. He was asked to continue the amlodipine at 10 mg by mouth daily, metoprolol XL 100 mg by mouth daily, and atorvastatin at 20 mg by mouth daily. We will reassess for evidence of chest pain on this regimen at the follow-up visit.

## 2016-08-05 NOTE — Assessment & Plan Note (Signed)
Assessment  He had iron deficiency presumably from a GI source. A CBC was drawn at this visit and is pending at the time of this dictation. The same is true for ferritin.  Plan  Further assessment will be done once the CBC is resulted. If he remains iron deficient we will continue with iron therapy, which he recently ran out of. If he is not iron deficient we will consider discontinuance of the iron therapy and restarting the antiplatelet therapy, specifically aspirin 81 mg by mouth daily. As he also recently ran out of his pantoprazole 40 mg daily this was renewed and sent to his new mail order pharmacy.

## 2016-08-05 NOTE — Assessment & Plan Note (Signed)
Assessment  We again discussed the importance of smoking cessation given his underlying cardiovascular disease. He states that he has decreased the number of cigarettes he smokes per day to 4.   Plan  He was praised for this progress and encouraged to continue to strive for complete abstinence.

## 2016-08-05 NOTE — Patient Instructions (Signed)
It was good to see you again.  I am sorry you are not feeling like you have enough energy.  1) Keep taking the medications as prescribed.  I sent all of the medications which you are out of to the mail order pharmacy.  2) Please restart the lisinopril 10 mg daily.  I sent this to the mail order pharmacy.  3) I drew some blood today.  I will call you early next week (Tuesday or Wednesday) with the results.  4) I sent a referral to the foot doctor to help with your toe nails.  5) Great job on cutting back on the smoking and drinking.  Keep up this good work and try to quit altogether.  I will see you back in 3 months, sooner if necessary.

## 2016-08-05 NOTE — Assessment & Plan Note (Signed)
Assessment  A basic metabolic panel was obtained and is pending at the time of this dictation.  Plan  We will follow-up on the results of the basic metabolic panel, specifically the BUN and creatinine, to assess the stability of his chronic kidney disease. We will continue to aggressively treat his cardiovascular risk factors including his diabetes, hypertension, and hyperlipidemia, and tobacco abuse.

## 2016-08-06 LAB — BMP8+ANION GAP
Anion Gap: 17 mmol/L (ref 10.0–18.0)
BUN/Creatinine Ratio: 9 — ABNORMAL LOW (ref 10–24)
BUN: 25 mg/dL (ref 8–27)
CALCIUM: 8.5 mg/dL — AB (ref 8.6–10.2)
CHLORIDE: 103 mmol/L (ref 96–106)
CO2: 22 mmol/L (ref 18–29)
Creatinine, Ser: 2.69 mg/dL — ABNORMAL HIGH (ref 0.76–1.27)
GFR calc Af Amer: 27 mL/min/{1.73_m2} — ABNORMAL LOW (ref >59)
GFR calc non Af Amer: 23 mL/min/{1.73_m2} — ABNORMAL LOW (ref >59)
GLUCOSE: 127 mg/dL — AB (ref 65–99)
POTASSIUM: 4.3 mmol/L (ref 3.5–5.2)
SODIUM: 142 mmol/L (ref 134–144)

## 2016-08-06 LAB — CBC WITH DIFFERENTIAL/PLATELET
BASOS: 0 %
Basophils Absolute: 0 10*3/uL (ref 0.0–0.2)
EOS (ABSOLUTE): 0 10*3/uL (ref 0.0–0.4)
EOS: 1 %
HEMATOCRIT: 27.5 % — AB (ref 37.5–51.0)
HEMOGLOBIN: 9.2 g/dL — AB (ref 12.6–17.7)
Immature Grans (Abs): 0 10*3/uL (ref 0.0–0.1)
Immature Granulocytes: 0 %
LYMPHS ABS: 1.4 10*3/uL (ref 0.7–3.1)
Lymphs: 33 %
MCH: 28.3 pg (ref 26.6–33.0)
MCHC: 33.5 g/dL (ref 31.5–35.7)
MCV: 85 fL (ref 79–97)
MONOCYTES: 7 %
Monocytes Absolute: 0.3 10*3/uL (ref 0.1–0.9)
NEUTROS ABS: 2.5 10*3/uL (ref 1.4–7.0)
Neutrophils: 59 %
Platelets: 161 10*3/uL (ref 150–379)
RBC: 3.25 x10E6/uL — ABNORMAL LOW (ref 4.14–5.80)
RDW: 17.7 % — ABNORMAL HIGH (ref 12.3–15.4)
WBC: 4.4 10*3/uL (ref 3.4–10.8)

## 2016-08-06 LAB — FERRITIN: Ferritin: 43 ng/mL (ref 30–400)

## 2016-08-09 NOTE — Progress Notes (Signed)
Patient ID: Kevin Castillo, male   DOB: November 17, 1946, 70 y.o.   MRN: [id number]  CBC Hgb 9.2, Hct 27.5, MCV 85  Anemia is improved.  At this point, I suspect he has anemia of chronic kidney disease.  If he remains symptomatic with generalized fatigue he may benefit from the initiation of Epo to a target Hgb of 10 sometime in the future, especially if his Hgb drops further.  Ferritin 43  No longer iron deficient.  Will continue with ferritin to continue to build iron stores in the setting of anemia secondary to chronic kidney disease.  If Epo is eventually started he will need to be on chronic iron replacement.  Will reassess ferritin at the follow-up visit.  If still well within the normal range and Epo is not being contemplated may discontinue the iron supplementation at that time.  BMP: K 4.3, BUN 25, Cr 2.69, eGFR 27  Stable stage IV chronic kidney disease.  I called Kevin Castillo with this result.  In the past he has been scared, but is now interested in seeing a kidney specialist.  I will therefore place a referral for Kevin Castillo to see nephrology given he is firmly in the stage IV chronic kidney disease range.  Over the phone he mentioned a cough for the last month that wakes Kevin Castillo at night.  He had not been on lisinopril for over 1 month so I do not believe we can blame this on an ACEI.  He continues to smoke and I again encouraged Kevin Castillo to quit as this is a very common cause of a cough.  At the last visit, I was concerned about decompensation in his cardiomyopathy with fluid overload and we made some adjustments in his regimen.  I encouraged Kevin Castillo to follow the changes we made last week to achieve this.  We will reassess the cough at the follow-up visit.  If it persists we will repeat the CXR and assess for other symptoms that may give a clue as to the cause.

## 2016-08-09 NOTE — Addendum Note (Signed)
Addended by: Oval Linsey D on: 08/09/2016 01:57 PM   Modules accepted: Orders

## 2016-09-13 ENCOUNTER — Other Ambulatory Visit: Payer: Self-pay | Admitting: Cardiovascular Disease

## 2016-09-13 ENCOUNTER — Ambulatory Visit (HOSPITAL_COMMUNITY)
Admission: RE | Admit: 2016-09-13 | Discharge: 2016-09-13 | Disposition: A | Discharge status: Home / Self Care | Payer: Commercial Managed Care - HMO | Source: Ambulatory Visit | Attending: Cardiovascular Disease | Admitting: Cardiovascular Disease

## 2016-09-13 DIAGNOSIS — R059 Cough, unspecified: Secondary | ICD-10-CM

## 2016-09-13 DIAGNOSIS — R05 Cough: Secondary | ICD-10-CM | POA: Diagnosis not present

## 2016-09-13 DIAGNOSIS — J208 Acute bronchitis due to other specified organisms: Secondary | ICD-10-CM | POA: Diagnosis not present

## 2016-09-13 DIAGNOSIS — R918 Other nonspecific abnormal finding of lung field: Secondary | ICD-10-CM | POA: Insufficient documentation

## 2016-09-13 DIAGNOSIS — D500 Iron deficiency anemia secondary to blood loss (chronic): Secondary | ICD-10-CM | POA: Diagnosis not present

## 2016-09-13 DIAGNOSIS — I10 Essential (primary) hypertension: Secondary | ICD-10-CM | POA: Diagnosis not present

## 2016-09-13 DIAGNOSIS — E1140 Type 2 diabetes mellitus with diabetic neuropathy, unspecified: Secondary | ICD-10-CM | POA: Diagnosis not present

## 2016-09-13 DIAGNOSIS — I517 Cardiomegaly: Secondary | ICD-10-CM | POA: Insufficient documentation

## 2016-09-13 DIAGNOSIS — E784 Other hyperlipidemia: Secondary | ICD-10-CM | POA: Diagnosis not present

## 2016-09-14 DIAGNOSIS — D631 Anemia in chronic kidney disease: Secondary | ICD-10-CM | POA: Diagnosis not present

## 2016-09-14 DIAGNOSIS — N2581 Secondary hyperparathyroidism of renal origin: Secondary | ICD-10-CM | POA: Diagnosis not present

## 2016-09-14 DIAGNOSIS — I739 Peripheral vascular disease, unspecified: Secondary | ICD-10-CM | POA: Diagnosis not present

## 2016-09-14 DIAGNOSIS — E1129 Type 2 diabetes mellitus with other diabetic kidney complication: Secondary | ICD-10-CM | POA: Diagnosis not present

## 2016-09-14 DIAGNOSIS — E785 Hyperlipidemia, unspecified: Secondary | ICD-10-CM | POA: Diagnosis not present

## 2016-09-14 DIAGNOSIS — N184 Chronic kidney disease, stage 4 (severe): Secondary | ICD-10-CM | POA: Diagnosis not present

## 2016-09-15 DIAGNOSIS — I739 Peripheral vascular disease, unspecified: Secondary | ICD-10-CM | POA: Diagnosis not present

## 2016-09-17 ENCOUNTER — Emergency Department (HOSPITAL_COMMUNITY)
Admission: EM | Admit: 2016-09-17 | Discharge: 2016-09-17 | Discharge status: Absent without leave | Payer: Commercial Managed Care - HMO

## 2016-09-17 ENCOUNTER — Inpatient Hospital Stay (HOSPITAL_COMMUNITY)
Admit: 2016-09-17 | Discharge: 2016-09-21 | DRG: 194 | Disposition: A | Discharge status: Home / Self Care | Payer: Commercial Managed Care - HMO | Source: Ambulatory Visit | Attending: Cardiovascular Disease | Admitting: Cardiovascular Disease

## 2016-09-17 ENCOUNTER — Encounter (HOSPITAL_COMMUNITY): Payer: Self-pay

## 2016-09-17 DIAGNOSIS — I5022 Chronic systolic (congestive) heart failure: Secondary | ICD-10-CM | POA: Diagnosis present

## 2016-09-17 DIAGNOSIS — E1122 Type 2 diabetes mellitus with diabetic chronic kidney disease: Secondary | ICD-10-CM | POA: Diagnosis not present

## 2016-09-17 DIAGNOSIS — I130 Hypertensive heart and chronic kidney disease with heart failure and stage 1 through stage 4 chronic kidney disease, or unspecified chronic kidney disease: Secondary | ICD-10-CM | POA: Diagnosis present

## 2016-09-17 DIAGNOSIS — R05 Cough: Secondary | ICD-10-CM | POA: Diagnosis present

## 2016-09-17 DIAGNOSIS — I10 Essential (primary) hypertension: Secondary | ICD-10-CM | POA: Diagnosis present

## 2016-09-17 DIAGNOSIS — J189 Pneumonia, unspecified organism: Secondary | ICD-10-CM | POA: Diagnosis not present

## 2016-09-17 DIAGNOSIS — E119 Type 2 diabetes mellitus without complications: Secondary | ICD-10-CM | POA: Diagnosis not present

## 2016-09-17 DIAGNOSIS — D509 Iron deficiency anemia, unspecified: Secondary | ICD-10-CM | POA: Diagnosis not present

## 2016-09-17 DIAGNOSIS — E1151 Type 2 diabetes mellitus with diabetic peripheral angiopathy without gangrene: Secondary | ICD-10-CM | POA: Diagnosis present

## 2016-09-17 DIAGNOSIS — Z8249 Family history of ischemic heart disease and other diseases of the circulatory system: Secondary | ICD-10-CM | POA: Diagnosis not present

## 2016-09-17 DIAGNOSIS — I2510 Atherosclerotic heart disease of native coronary artery without angina pectoris: Secondary | ICD-10-CM | POA: Diagnosis present

## 2016-09-17 DIAGNOSIS — Z833 Family history of diabetes mellitus: Secondary | ICD-10-CM

## 2016-09-17 DIAGNOSIS — E669 Obesity, unspecified: Secondary | ICD-10-CM | POA: Diagnosis not present

## 2016-09-17 DIAGNOSIS — E785 Hyperlipidemia, unspecified: Secondary | ICD-10-CM | POA: Diagnosis present

## 2016-09-17 DIAGNOSIS — N185 Chronic kidney disease, stage 5: Secondary | ICD-10-CM

## 2016-09-17 DIAGNOSIS — Z86711 Personal history of pulmonary embolism: Secondary | ICD-10-CM

## 2016-09-17 DIAGNOSIS — Z6833 Body mass index (BMI) 33.0-33.9, adult: Secondary | ICD-10-CM

## 2016-09-17 DIAGNOSIS — I252 Old myocardial infarction: Secondary | ICD-10-CM

## 2016-09-17 DIAGNOSIS — F17210 Nicotine dependence, cigarettes, uncomplicated: Secondary | ICD-10-CM | POA: Diagnosis present

## 2016-09-17 DIAGNOSIS — Z86718 Personal history of other venous thrombosis and embolism: Secondary | ICD-10-CM

## 2016-09-17 DIAGNOSIS — N184 Chronic kidney disease, stage 4 (severe): Secondary | ICD-10-CM | POA: Diagnosis present

## 2016-09-17 DIAGNOSIS — Z951 Presence of aortocoronary bypass graft: Secondary | ICD-10-CM | POA: Diagnosis not present

## 2016-09-17 DIAGNOSIS — Z888 Allergy status to other drugs, medicaments and biological substances status: Secondary | ICD-10-CM

## 2016-09-17 DIAGNOSIS — E6609 Other obesity due to excess calories: Secondary | ICD-10-CM | POA: Diagnosis not present

## 2016-09-17 DIAGNOSIS — F1010 Alcohol abuse, uncomplicated: Secondary | ICD-10-CM | POA: Diagnosis present

## 2016-09-17 DIAGNOSIS — J168 Pneumonia due to other specified infectious organisms: Secondary | ICD-10-CM | POA: Diagnosis not present

## 2016-09-17 DIAGNOSIS — D500 Iron deficiency anemia secondary to blood loss (chronic): Secondary | ICD-10-CM | POA: Diagnosis not present

## 2016-09-17 LAB — COMPREHENSIVE METABOLIC PANEL
ALBUMIN: 3.4 g/dL — AB (ref 3.5–5.0)
ALK PHOS: 94 U/L (ref 38–126)
ALT: 14 U/L — AB (ref 17–63)
ANION GAP: 8 (ref 5–15)
AST: 20 U/L (ref 15–41)
BILIRUBIN TOTAL: 0.3 mg/dL (ref 0.3–1.2)
BUN: 32 mg/dL — AB (ref 6–20)
CALCIUM: 8.3 mg/dL — AB (ref 8.9–10.3)
CO2: 26 mmol/L (ref 22–32)
CREATININE: 3.38 mg/dL — AB (ref 0.61–1.24)
Chloride: 101 mmol/L (ref 101–111)
GFR calc Af Amer: 20 mL/min — ABNORMAL LOW (ref >60)
GFR calc non Af Amer: 17 mL/min — ABNORMAL LOW (ref >60)
GLUCOSE: 99 mg/dL (ref 65–99)
Potassium: 3.9 mmol/L (ref 3.5–5.1)
SODIUM: 135 mmol/L (ref 135–145)
TOTAL PROTEIN: 7.1 g/dL (ref 6.5–8.1)

## 2016-09-17 LAB — CBC WITH DIFFERENTIAL/PLATELET
BASOS ABS: 0 10*3/uL (ref 0.0–0.1)
BASOS PCT: 1 %
EOS ABS: 0.1 10*3/uL (ref 0.0–0.7)
Eosinophils Relative: 2 %
HCT: 30.9 % — ABNORMAL LOW (ref 39.0–52.0)
Hemoglobin: 9.8 g/dL — ABNORMAL LOW (ref 13.0–17.0)
LYMPHS ABS: 1.4 10*3/uL (ref 0.7–4.0)
LYMPHS PCT: 39 %
MCH: 27.2 pg (ref 26.0–34.0)
MCHC: 31.7 g/dL (ref 30.0–36.0)
MCV: 85.8 fL (ref 78.0–100.0)
MONO ABS: 0.2 10*3/uL (ref 0.1–1.0)
Monocytes Relative: 6 %
NEUTROS ABS: 2 10*3/uL (ref 1.7–7.7)
Neutrophils Relative %: 52 %
PLATELETS: 191 10*3/uL (ref 150–400)
RBC: 3.6 MIL/uL — ABNORMAL LOW (ref 4.22–5.81)
RDW: 15.6 % — ABNORMAL HIGH (ref 11.5–15.5)
WBC: 3.7 10*3/uL — ABNORMAL LOW (ref 4.0–10.5)

## 2016-09-17 MED ORDER — DOCUSATE SODIUM 100 MG PO CAPS
100.0000 mg | ORAL_CAPSULE | Freq: Two times a day (BID) | ORAL | Status: DC
  Administered 2016-09-17 – 2016-09-21 (×7): 100 mg via ORAL
  Filled 2016-09-17 (×8): qty 1

## 2016-09-17 MED ORDER — ALBUTEROL SULFATE (2.5 MG/3ML) 0.083% IN NEBU
2.5000 mg | INHALATION_SOLUTION | Freq: Four times a day (QID) | RESPIRATORY_TRACT | Status: DC | PRN
  Administered 2016-09-18: 2.5 mg via RESPIRATORY_TRACT
  Filled 2016-09-17: qty 3

## 2016-09-17 MED ORDER — LISINOPRIL 10 MG PO TABS
10.0000 mg | ORAL_TABLET | Freq: Every day | ORAL | Status: DC
  Administered 2016-09-19 – 2016-09-21 (×3): 10 mg via ORAL
  Filled 2016-09-17 (×4): qty 1

## 2016-09-17 MED ORDER — SODIUM CHLORIDE 0.9% FLUSH: 3.0000 mL | INTRAVENOUS | Status: DC | PRN

## 2016-09-17 MED ORDER — DEXTROSE 5 % IV SOLN
1.0000 g | INTRAVENOUS | Status: DC
  Administered 2016-09-17 – 2016-09-20 (×4): 1 g via INTRAVENOUS
  Filled 2016-09-17 (×5): qty 10

## 2016-09-17 MED ORDER — ATORVASTATIN CALCIUM 20 MG PO TABS
20.0000 mg | ORAL_TABLET | Freq: Every day | ORAL | Status: DC
  Administered 2016-09-18 – 2016-09-21 (×4): 20 mg via ORAL
  Filled 2016-09-17 (×5): qty 1

## 2016-09-17 MED ORDER — AMLODIPINE BESYLATE 10 MG PO TABS
10.0000 mg | ORAL_TABLET | Freq: Every day | ORAL | Status: DC
  Administered 2016-09-18 – 2016-09-21 (×4): 10 mg via ORAL
  Filled 2016-09-17 (×4): qty 1

## 2016-09-17 MED ORDER — AZITHROMYCIN 500 MG PO TABS
500.0000 mg | ORAL_TABLET | Freq: Every day | ORAL | Status: AC
  Administered 2016-09-17 – 2016-09-19 (×3): 500 mg via ORAL
  Filled 2016-09-17 (×3): qty 1

## 2016-09-17 MED ORDER — SODIUM CHLORIDE 0.9 % IV SOLN: 250.0000 mL | INTRAVENOUS | Status: DC | PRN

## 2016-09-17 MED ORDER — TERAZOSIN HCL 5 MG PO CAPS
5.0000 mg | ORAL_CAPSULE | Freq: Every day | ORAL | Status: DC
  Administered 2016-09-17 – 2016-09-20 (×4): 5 mg via ORAL
  Filled 2016-09-17 (×4): qty 1

## 2016-09-17 MED ORDER — GUAIFENESIN ER 600 MG PO TB12
600.0000 mg | ORAL_TABLET | Freq: Two times a day (BID) | ORAL | Status: DC
  Administered 2016-09-17 – 2016-09-21 (×8): 600 mg via ORAL
  Filled 2016-09-17 (×8): qty 1

## 2016-09-17 MED ORDER — SODIUM CHLORIDE 0.9 % IV SOLN
INTRAVENOUS | Status: DC
  Administered 2016-09-17 – 2016-09-20 (×4): via INTRAVENOUS
  Administered 2016-09-21: 50 mL/h via INTRAVENOUS

## 2016-09-17 MED ORDER — ALBUTEROL SULFATE (2.5 MG/3ML) 0.083% IN NEBU
2.5000 mg | INHALATION_SOLUTION | Freq: Four times a day (QID) | RESPIRATORY_TRACT | Status: DC
  Administered 2016-09-17: 2.5 mg via RESPIRATORY_TRACT
  Filled 2016-09-17: qty 3

## 2016-09-17 MED ORDER — ACETAMINOPHEN 650 MG RE SUPP: 650.0000 mg | Freq: Four times a day (QID) | RECTAL | Status: DC | PRN

## 2016-09-17 MED ORDER — ONDANSETRON HCL 4 MG/2ML IJ SOLN: 4.0000 mg | Freq: Four times a day (QID) | INTRAMUSCULAR | Status: DC | PRN

## 2016-09-17 MED ORDER — ACETAMINOPHEN 325 MG PO TABS
650.0000 mg | ORAL_TABLET | Freq: Four times a day (QID) | ORAL | Status: DC | PRN
  Administered 2016-09-17 – 2016-09-19 (×3): 650 mg via ORAL
  Filled 2016-09-17 (×3): qty 2

## 2016-09-17 MED ORDER — ONDANSETRON HCL 4 MG PO TABS: 4.0000 mg | ORAL_TABLET | Freq: Four times a day (QID) | ORAL | Status: DC | PRN

## 2016-09-17 MED ORDER — SODIUM CHLORIDE 0.9% FLUSH
3.0000 mL | Freq: Two times a day (BID) | INTRAVENOUS | Status: DC
  Administered 2016-09-19 – 2016-09-21 (×2): 3 mL via INTRAVENOUS

## 2016-09-17 MED ORDER — METOPROLOL SUCCINATE ER 100 MG PO TB24
100.0000 mg | ORAL_TABLET | Freq: Every day | ORAL | Status: DC
  Administered 2016-09-18 – 2016-09-21 (×4): 100 mg via ORAL
  Filled 2016-09-17 (×4): qty 1

## 2016-09-17 MED ORDER — FERROUS SULFATE 325 (65 FE) MG PO TABS
325.0000 mg | ORAL_TABLET | Freq: Every day | ORAL | Status: DC
  Administered 2016-09-18 – 2016-09-21 (×4): 325 mg via ORAL
  Filled 2016-09-17 (×4): qty 1

## 2016-09-17 MED ORDER — PANTOPRAZOLE SODIUM 40 MG PO TBEC
40.0000 mg | DELAYED_RELEASE_TABLET | Freq: Every day | ORAL | Status: DC
  Administered 2016-09-18 – 2016-09-21 (×4): 40 mg via ORAL
  Filled 2016-09-17 (×4): qty 1

## 2016-09-17 MED ORDER — FUROSEMIDE 80 MG PO TABS
80.0000 mg | ORAL_TABLET | Freq: Every day | ORAL | Status: DC
  Administered 2016-09-18 – 2016-09-21 (×4): 80 mg via ORAL
  Filled 2016-09-17 (×4): qty 1

## 2016-09-17 MED ORDER — ADULT MULTIVITAMIN W/MINERALS CH
1.0000 | ORAL_TABLET | Freq: Every day | ORAL | Status: DC
  Administered 2016-09-18 – 2016-09-21 (×4): 1 via ORAL
  Filled 2016-09-17 (×4): qty 1

## 2016-09-17 NOTE — H&P (Signed)
Referring Physician:  [name] is an 70 y.o. male.                       Chief Complaint: Cough x 3 weeks  HPI: 70 year old with PMH of DM, II, CKD IV, hypertension, tobacco use disorder, CAd, CABG, PVD, obesity has chronic cough for 3 weeks. He had chest x-ray 3 daysback suggesting right lung pneumonia and some scarring.  Past Medical History:  Diagnosis Date  . Acute left systolic heart failure ([place]) 01/06/2016  . Adenoma of left adrenal gland 06/16/2014   CT Abdomen (06/04/2014): Incidental, 20 mm, 8.33 HU suggesting benign adenoma   . Alcohol abuse 02/15/2007   Reports consuming 1 pint of gin on weekends, does not drink every day. Denies history of seizures, blackouts, or tremors.    . Allergic rhinitis 04/09/2014  . Anemia 11/12/2013   Unclear cause as of yet (? EtOH abuse)   . Benign prostatic hypertrophy 06/02/2014  . Chronic kidney disease, stage 3 11/12/2013  . Chronic systolic heart failure ([place]) 01/15/2016   Echo (01/07/2016): LVEF 35%  . Coronary artery disease 02/15/2007   Non-STEMI 07/2005, s/p CABG x 4 (08/01/2005): LIMA to LAD, SVG to OM, RCA, PCA   . DVT (deep venous thrombosis) ([place]) 2006   RLE "when I had OHS"  . Erectile dysfunction associated with type 2 diabetes mellitus ([place]) 02/15/2007  . Essential hypertension 02/15/2007  . Hyperlipidemia 02/15/2007  . Major depression, single episode 11/02/2015  . Obesity (BMI 30.0-34.9) 02/15/2007  . Peripheral vascular occlusive disease ([place]) 12/30/2013   ABI (01/03/2014): Right 0.64, Left 0.64   . Sigmoid diverticulosis 01/15/2016   Minimal per colonoscopy 01/11/2016  . Tobacco abuse 02/15/2007  . Tubular adenoma of colon 01/11/2016   Two 6 mm semi-pedunculated tubular adenomas excised endoscopically 01/11/2016.  Repeat colonoscopy due 12/2020.  . Type 2 diabetes mellitus with stage 3 chronic kidney disease ([place]) 02/15/2007  . Venous thromboembolism 11/11/2013   LE Dopplers (01/2011): RLE DVT. CTA with mild subacute to chronic  right-sided pulmonary emboli.  Patient has elected to continue anticoagulation.       Past Surgical History:  Procedure Laterality Date  . CARDIAC CATHETERIZATION  07/2005  . CATARACT EXTRACTION W/ INTRAOCULAR LENS  IMPLANT, BILATERAL Bilateral 04/2014   Left 04/30/2014, Right 05/07/2014  . COLONOSCOPY WITH PROPOFOL N/A 01/11/2016   Procedure: COLONOSCOPY WITH PROPOFOL;  Surgeon: Ronald Lobo, MD;  Location: [facility];  Service: Endoscopy;  Laterality: N/A;  Needs MAC.  Marland Kitchen CORONARY ARTERY BYPASS GRAFT  08/01/2005   x 4, LIMA to LAD, SVG to OM, RCA, PCA  . ESOPHAGOGASTRODUODENOSCOPY N/A 01/08/2016   Procedure: ESOPHAGOGASTRODUODENOSCOPY (EGD);  Surgeon: Wilford Corner, MD;  Location[facility] ENDOSCOPY;  Service: Endoscopy;  Laterality: N/A;  . GIVENS CAPSULE STUDY N/A 01/12/2016   Procedure: GIVENS CAPSULE STUDY;  Surgeon: Ronald Lobo, MD;  Location: [facility];  Service: Endoscopy;  Laterality: N/A;    Family History  Problem Relation Age of Onset  . Heart disease Father   . COPD Father   . Diabetes Mellitus II Mother   . Healthy Sister   . Healthy Brother   . Healthy Daughter   . Healthy Son   . Healthy Sister   . Healthy Sister   . Healthy Sister   . Healthy Sister   . Healthy Daughter   . Healthy Son   . Healthy Son   . Healthy Son   . Healthy Son   .  Healthy Son   . Healthy Son    Social History:  reports that he has been smoking Cigarettes.  He has a 6.60 pack-year smoking history. He has quit using smokeless tobacco. His smokeless tobacco use included Snuff and Chew. He reports that he drinks about 6.6 oz of alcohol per week . He reports that he does not use drugs.  Allergies:  Allergies  Allergen Reactions  . Nicotine Transdermal System [Nicotine] Other (See Comments)    Vivid dreams    Medications Prior to Admission  Medication Sig Dispense Refill  . amLODipine (NORVASC) 10 MG tablet TAKE 1 TABLET (10 MG TOTAL) BY MOUTH DAILY. 90 tablet 3  . atorvastatin  (LIPITOR) 20 MG tablet TAKE 1 TABLET EVERY DAY 90 tablet 3  . ferrous sulfate 325 (65 FE) MG tablet Take 1 tablet (325 mg total) by mouth daily with breakfast. 90 tablet 3  . furosemide (LASIX) 80 MG tablet Take 1 tablet (80 mg total) by mouth daily. 30 tablet 3  . lisinopril (PRINIVIL,ZESTRIL) 10 MG tablet Take 1 tablet (10 mg total) by mouth daily. 30 tablet 11  . metoprolol succinate (TOPROL-XL) 100 MG 24 hr tablet Take 1 tablet (100 mg total) by mouth daily. 90 tablet 3  . pantoprazole (PROTONIX) 40 MG tablet Take 1 tablet (40 mg total) by mouth daily. 90 tablet 3  . terazosin (HYTRIN) 5 MG capsule TAKE 1 CAPSULE AT BEDTIME 90 capsule 3    No results found for this or any previous visit (from the past 48 hour(s)). No results found.  Review Of Systems Constitutional: Positive fatigue. No fever or chills. Respiratory: Positive cough, shortness of breath. Cardiovascular: Positive chest pain, shortness of breath and leg edema. GI: Positive nausea and left sided abdominal pain. GU: No hematuria or dysuria. Neurology: No dizziness, vision change or speech disturbance or seizures. Psych: No admission for anxiety or depression Skin: No rash. Musculoskeletal: Positive arthritis.  Blood pressure (!) 130/54, pulse 72, temperature 98.2 F (36.8 C), temperature source Oral, resp. rate 18, SpO2 100 %. General appearance: alert, cooperative, appears stated age and mild distress. Head: Normocephalic, atraumatic Eyes: Brown eyes, Pale pink conjunctivae, corneas clear, Sclera-muddy. PERRL, EOM's intact.  Neck: No adenopathy, No carotid bruit, No JVD, supple, symmetrical, trachea midline and thyroid not enlarged. Resp: Scattered rhonchi, to auscultation bilaterally Cardio: regular rate and rhythm, S1, S2 normal, no click, rub or gallop. II/VI systolic murmur. GI: soft, left lumbar area tender; bowel sounds normal; no masses,  no organomegaly. Extremities: extremities normal, no cyanosis or  edema. Skin: Warm and dry. No rashes or lesions Neurologic: Alert and oriented X 3, normal strength and tone. Normal coordination and gait  Assessment/Plan Right lung pneumonia CKD IV Hypertension Hyperlipidemia DM, II Obesity CAD CABG PVD Tobacco use disorder Alcohol use disorder  Admit/IV antibioptics   Burnett Spray S, MD  09/17/2016, 5:36 PM

## 2016-09-18 LAB — BASIC METABOLIC PANEL
Anion gap: 7 (ref 5–15)
BUN: 31 mg/dL — AB (ref 6–20)
CHLORIDE: 105 mmol/L (ref 101–111)
CO2: 27 mmol/L (ref 22–32)
Calcium: 8.3 mg/dL — ABNORMAL LOW (ref 8.9–10.3)
Creatinine, Ser: 3.3 mg/dL — ABNORMAL HIGH (ref 0.61–1.24)
GFR calc Af Amer: 20 mL/min — ABNORMAL LOW (ref >60)
GFR calc non Af Amer: 18 mL/min — ABNORMAL LOW (ref >60)
GLUCOSE: 101 mg/dL — AB (ref 65–99)
POTASSIUM: 3.8 mmol/L (ref 3.5–5.1)
Sodium: 139 mmol/L (ref 135–145)

## 2016-09-18 LAB — CBC
HEMATOCRIT: 29.1 % — AB (ref 39.0–52.0)
Hemoglobin: 9.1 g/dL — ABNORMAL LOW (ref 13.0–17.0)
MCH: 27.2 pg (ref 26.0–34.0)
MCHC: 31.3 g/dL (ref 30.0–36.0)
MCV: 86.9 fL (ref 78.0–100.0)
Platelets: 170 10*3/uL (ref 150–400)
RBC: 3.35 MIL/uL — ABNORMAL LOW (ref 4.22–5.81)
RDW: 15.6 % — AB (ref 11.5–15.5)
WBC: 3.6 10*3/uL — AB (ref 4.0–10.5)

## 2016-09-18 NOTE — Progress Notes (Signed)
Ref: Karren Cobble, MD   Subjective:  Still coughing. Afebrile.  Objective:  Vital Signs in the last 24 hours: Temp:  [98.2 F (36.8 C)-98.4 F (36.9 C)] 98.4 F (36.9 C) (10/01 0645) Pulse Rate:  [69-92] 69 (10/01 0645) Cardiac Rhythm: Normal sinus rhythm (10/01 0700) Resp:  [18-20] 20 (10/01 0645) BP: (130-166)/(53-71) 154/71 (10/01 0651) SpO2:  [94 %-100 %] 96 % (10/01 0645)  Physical Exam: BP Readings from Last 1 Encounters:  09/18/16 (!) 154/71    Wt Readings from Last 1 Encounters:  08/05/16 104.8 kg (231 lb 1.6 oz)    Weight change:   HEENT: [place]/AT, Eyes-Brown, PERL, EOMI, Conjunctiva-Pale, Sclera-Non-icteric Neck: No JVD, No bruit, Trachea midline. Lungs:  Coarce crackles, Bilateral. Cardiac:  Regular rhythm, normal S1 and S2, no S3. II/VI systolic murmur. Abdomen:  Soft, non-tender. Extremities:  No edema present. No cyanosis. No clubbing. CNS: AxOx3, Cranial nerves grossly intact, moves all 4 extremities.  Skin: Warm and dry.   Intake/Output from previous day: No intake/output data recorded.    Lab Results: BMET    Component Value Date/Time   NA 139 09/18/2016 0230   NA 135 09/17/2016 1650   NA 142 08/05/2016 1115   NA 141 03/25/2016 1115   NA 140 01/11/2016 0437   NA 144 12/16/2015 1635   K 3.8 09/18/2016 0230   K 3.9 09/17/2016 1650   K 4.3 08/05/2016 1115   CL 105 09/18/2016 0230   CL 101 09/17/2016 1650   CL 103 08/05/2016 1115   CO2 27 09/18/2016 0230   CO2 26 09/17/2016 1650   CO2 22 08/05/2016 1115   GLUCOSE 101 (H) 09/18/2016 0230   GLUCOSE 99 09/17/2016 1650   GLUCOSE 127 (H) 08/05/2016 1115   GLUCOSE 138 (H) 03/25/2016 1115   GLUCOSE 108 (H) 01/11/2016 0437   BUN 31 (H) 09/18/2016 0230   BUN 32 (H) 09/17/2016 1650   BUN 25 08/05/2016 1115   BUN 31 (H) 03/25/2016 1115   BUN 34 (H) 01/11/2016 0437   BUN 30 (H) 12/16/2015 1635   CREATININE 3.30 (H) 09/18/2016 0230   CREATININE 3.38 (H) 09/17/2016 1650   CREATININE 2.69 (H)  08/05/2016 1115   CREATININE 1.54 (H) 05/28/2014 1046   CREATININE 1.43 (H) 03/05/2014 1532   CREATININE 1.47 (H) 12/30/2013 1532   CALCIUM 8.3 (L) 09/18/2016 0230   CALCIUM 8.3 (L) 09/17/2016 1650   CALCIUM 8.5 (L) 08/05/2016 1115   GFRNONAA 18 (L) 09/18/2016 0230   GFRNONAA 17 (L) 09/17/2016 1650   GFRNONAA 23 (L) 08/05/2016 1115   GFRNONAA 46 (L) 05/28/2014 1046   GFRNONAA 50 (L) 03/05/2014 1532   GFRNONAA 55 (L) 11/18/2013 1101   GFRAA 20 (L) 09/18/2016 0230   GFRAA 20 (L) 09/17/2016 1650   GFRAA 27 (L) 08/05/2016 1115   GFRAA 53 (L) 05/28/2014 1046   GFRAA 58 (L) 03/05/2014 1532   GFRAA 64 11/18/2013 1101   CBC    Component Value Date/Time   WBC 3.6 (L) 09/18/2016 0230   RBC 3.35 (L) 09/18/2016 0230   HGB 9.1 (L) 09/18/2016 0230   HCT 29.1 (L) 09/18/2016 0230   HCT 27.5 (L) 08/05/2016 1115   PLT 170 09/18/2016 0230   PLT 161 08/05/2016 1115   MCV 86.9 09/18/2016 0230   MCV 85 08/05/2016 1115   MCH 27.2 09/18/2016 0230   MCHC 31.3 09/18/2016 0230   RDW 15.6 (H) 09/18/2016 0230   RDW 17.7 (H) 08/05/2016 1115   LYMPHSABS 1.4  09/17/2016 1650   LYMPHSABS 1.4 08/05/2016 1115   MONOABS 0.2 09/17/2016 1650   EOSABS 0.1 09/17/2016 1650   EOSABS 0.0 08/05/2016 1115   BASOSABS 0.0 09/17/2016 1650   BASOSABS 0.0 08/05/2016 1115   HEPATIC Function Panel  Recent Labs  12/16/15 1635 01/06/16 1718 09/17/16 1650  PROT 6.8 7.0 7.1   HEMOGLOBIN A1C No components found for: HGA1C,  MPG CARDIAC ENZYMES Lab Results  Component Value Date   CKTOTAL 64 05/26/2009   CKMB 0.7 05/26/2009   TROPONINI 0.04 (H) 01/07/2016   TROPONINI 0.04 (H) 01/06/2016   TROPONINI 0.03 01/06/2016   BNP No results for input(s): PROBNP in the last 8760 hours. TSH  Recent Labs  01/06/16 1718  TSH 1.179   CHOLESTEROL No results for input(s): CHOL in the last 8760 hours.  Scheduled Meds: . amLODipine  10 mg Oral Daily  . atorvastatin  20 mg Oral Daily  . azithromycin  500 mg Oral  Daily  . cefTRIAXone (ROCEPHIN)  IV  1 g Intravenous Q24H  . docusate sodium  100 mg Oral BID  . ferrous sulfate  325 mg Oral Q breakfast  . furosemide  80 mg Oral Daily  . guaiFENesin  600 mg Oral BID  . lisinopril  10 mg Oral Daily  . metoprolol succinate  100 mg Oral Daily  . multivitamin with minerals  1 tablet Oral Daily  . pantoprazole  40 mg Oral Daily  . sodium chloride flush  3 mL Intravenous Q12H  . terazosin  5 mg Oral QHS   Continuous Infusions: . sodium chloride 50 mL/hr at 09/17/16 1843   PRN Meds:.sodium chloride, acetaminophen **OR** acetaminophen, albuterol, ondansetron **OR** ondansetron (ZOFRAN) IV, sodium chloride flush  Assessment/Plan: Right lung pneumonia CKD IV Hypertension Hiperlipidemia DM, II Obesity CAD CABG PVD Tobacco use disorder Alcohol use disorder  Continue medical treatment. Respiratory tech notified of breathing treatment. Add flutter valve.    LOS: 1 day    Dixie Dials  MD  09/18/2016, 9:30 AM

## 2016-09-18 NOTE — Progress Notes (Signed)
Rt instructed pt on the use of flutter valve. Pt able to demonstrate back good effort.

## 2016-09-18 NOTE — Progress Notes (Signed)
PRN given per MD request.

## 2016-09-19 LAB — GLUCOSE, CAPILLARY
GLUCOSE-CAPILLARY: 182 mg/dL — AB (ref 65–99)
Glucose-Capillary: 151 mg/dL — ABNORMAL HIGH (ref 65–99)
Glucose-Capillary: 146 mg/dL — ABNORMAL HIGH (ref 65–99)
Glucose-Capillary: 92 mg/dL (ref 65–99)

## 2016-09-19 NOTE — Progress Notes (Signed)
Ref: Karren Cobble, MD   Subjective:  No significant improvement in cough. Afebrile.  Objective:  Vital Signs in the last 24 hours: Temp:  [97.8 F (36.6 C)-98.8 F (37.1 C)] 98.3 F (36.8 C) (10/02 0426) Pulse Rate:  [60-73] 73 (10/02 0426) Cardiac Rhythm: Normal sinus rhythm (10/02 0700) Resp:  [18] 18 (10/02 0426) BP: (120-165)/(55-97) 165/97 (10/02 0941) SpO2:  [95 %-98 %] 95 % (10/02 0426)  Physical Exam: BP Readings from Last 1 Encounters:  09/19/16 (!) 165/97    Wt Readings from Last 1 Encounters:  08/05/16 104.8 kg (231 lb 1.6 oz)    Weight change:   HEENT: [place]/AT, Eyes-Brown, PERL, EOMI, Conjunctiva-Pink, Sclera-Non-icteric Neck: No JVD, No bruit, Trachea midline. Lungs:  Coarse crackles, Bilateral. Cardiac:  Regular rhythm, normal S1 and S2, no S3. II/VI systolic murmur.  Abdomen:  Soft, non-tender. Extremities:  No edema present. No cyanosis. No clubbing. CNS: AxOx3, Cranial nerves grossly intact, moves all 4 extremities. Right handed. Skin: Warm and dry.   Intake/Output from previous day: 10/01 0701 - 10/02 0700 In: 240 [P.O.:240] Out: -     Lab Results: BMET    Component Value Date/Time   NA 139 09/18/2016 0230   NA 135 09/17/2016 1650   NA 142 08/05/2016 1115   NA 141 03/25/2016 1115   NA 140 01/11/2016 0437   NA 144 12/16/2015 1635   K 3.8 09/18/2016 0230   K 3.9 09/17/2016 1650   K 4.3 08/05/2016 1115   CL 105 09/18/2016 0230   CL 101 09/17/2016 1650   CL 103 08/05/2016 1115   CO2 27 09/18/2016 0230   CO2 26 09/17/2016 1650   CO2 22 08/05/2016 1115   GLUCOSE 101 (H) 09/18/2016 0230   GLUCOSE 99 09/17/2016 1650   GLUCOSE 127 (H) 08/05/2016 1115   GLUCOSE 138 (H) 03/25/2016 1115   GLUCOSE 108 (H) 01/11/2016 0437   BUN 31 (H) 09/18/2016 0230   BUN 32 (H) 09/17/2016 1650   BUN 25 08/05/2016 1115   BUN 31 (H) 03/25/2016 1115   BUN 34 (H) 01/11/2016 0437   BUN 30 (H) 12/16/2015 1635   CREATININE 3.30 (H) 09/18/2016 0230   CREATININE  3.38 (H) 09/17/2016 1650   CREATININE 2.69 (H) 08/05/2016 1115   CREATININE 1.54 (H) 05/28/2014 1046   CREATININE 1.43 (H) 03/05/2014 1532   CREATININE 1.47 (H) 12/30/2013 1532   CALCIUM 8.3 (L) 09/18/2016 0230   CALCIUM 8.3 (L) 09/17/2016 1650   CALCIUM 8.5 (L) 08/05/2016 1115   GFRNONAA 18 (L) 09/18/2016 0230   GFRNONAA 17 (L) 09/17/2016 1650   GFRNONAA 23 (L) 08/05/2016 1115   GFRNONAA 46 (L) 05/28/2014 1046   GFRNONAA 50 (L) 03/05/2014 1532   GFRNONAA 55 (L) 11/18/2013 1101   GFRAA 20 (L) 09/18/2016 0230   GFRAA 20 (L) 09/17/2016 1650   GFRAA 27 (L) 08/05/2016 1115   GFRAA 53 (L) 05/28/2014 1046   GFRAA 58 (L) 03/05/2014 1532   GFRAA 64 11/18/2013 1101   CBC    Component Value Date/Time   WBC 3.6 (L) 09/18/2016 0230   RBC 3.35 (L) 09/18/2016 0230   HGB 9.1 (L) 09/18/2016 0230   HCT 29.1 (L) 09/18/2016 0230   HCT 27.5 (L) 08/05/2016 1115   PLT 170 09/18/2016 0230   PLT 161 08/05/2016 1115   MCV 86.9 09/18/2016 0230   MCV 85 08/05/2016 1115   MCH 27.2 09/18/2016 0230   MCHC 31.3 09/18/2016 0230   RDW 15.6 (H) 09/18/2016  0230   RDW 17.7 (H) 08/05/2016 1115   LYMPHSABS 1.4 09/17/2016 1650   LYMPHSABS 1.4 08/05/2016 1115   MONOABS 0.2 09/17/2016 1650   EOSABS 0.1 09/17/2016 1650   EOSABS 0.0 08/05/2016 1115   BASOSABS 0.0 09/17/2016 1650   BASOSABS 0.0 08/05/2016 1115   HEPATIC Function Panel  Recent Labs  12/16/15 1635 01/06/16 1718 09/17/16 1650  PROT 6.8 7.0 7.1   HEMOGLOBIN A1C No components found for: HGA1C,  MPG CARDIAC ENZYMES Lab Results  Component Value Date   CKTOTAL 64 05/26/2009   CKMB 0.7 05/26/2009   TROPONINI 0.04 (H) 01/07/2016   TROPONINI 0.04 (H) 01/06/2016   TROPONINI 0.03 01/06/2016   BNP No results for input(s): PROBNP in the last 8760 hours. TSH  Recent Labs  01/06/16 1718  TSH 1.179   CHOLESTEROL No results for input(s): CHOL in the last 8760 hours.  Scheduled Meds: . amLODipine  10 mg Oral Daily  . atorvastatin  20  mg Oral Daily  . cefTRIAXone (ROCEPHIN)  IV  1 g Intravenous Q24H  . docusate sodium  100 mg Oral BID  . ferrous sulfate  325 mg Oral Q breakfast  . furosemide  80 mg Oral Daily  . guaiFENesin  600 mg Oral BID  . lisinopril  10 mg Oral Daily  . metoprolol succinate  100 mg Oral Daily  . multivitamin with minerals  1 tablet Oral Daily  . pantoprazole  40 mg Oral Daily  . sodium chloride flush  3 mL Intravenous Q12H  . terazosin  5 mg Oral QHS   Continuous Infusions: . sodium chloride 50 mL/hr at 09/19/16 0022   PRN Meds:.sodium chloride, acetaminophen **OR** acetaminophen, albuterol, ondansetron **OR** ondansetron (ZOFRAN) IV, sodium chloride flush  Assessment/Plan: Right lung pneumonia CKD IV Hypertension DM, II Obesity Hyperlipidemia CAD CABG PVD Tobacco use disorder Alcohol use disorder  Continue medical treatment. Patient to use flutter valve more frequently.   LOS: 2 days    Dixie Dials  MD  09/19/2016, 9:50 AM

## 2016-09-20 LAB — GLUCOSE, CAPILLARY
GLUCOSE-CAPILLARY: 132 mg/dL — AB (ref 65–99)
GLUCOSE-CAPILLARY: 93 mg/dL (ref 65–99)
Glucose-Capillary: 151 mg/dL — ABNORMAL HIGH (ref 65–99)
Glucose-Capillary: 141 mg/dL — ABNORMAL HIGH (ref 65–99)

## 2016-09-20 MED ORDER — HYDRALAZINE HCL 25 MG PO TABS
25.0000 mg | ORAL_TABLET | Freq: Once | ORAL | Status: AC
  Administered 2016-09-20: 25 mg via ORAL
  Filled 2016-09-20: qty 1

## 2016-09-20 NOTE — Progress Notes (Signed)
Ref: Karren Cobble, MD   Subjective:  Little better with flutter valve use. Afebrile.  Objective:  Vital Signs in the last 24 hours: Temp:  [97.7 F (36.5 C)-98 F (36.7 C)] 98 F (36.7 C) (10/03 0414) Pulse Rate:  [68-73] 73 (10/03 0414) Cardiac Rhythm: Normal sinus rhythm (10/03 0700) Resp:  [18] 18 (10/03 0414) BP: (135-165)/(72-97) 152/73 (10/03 0414) SpO2:  [98 %] 98 % (10/03 0414) Weight:  [96.1 kg (211 lb 12.8 oz)] 96.1 kg (211 lb 12.8 oz) (10/03 0414)  Physical Exam: BP Readings from Last 1 Encounters:  09/20/16 (!) 152/73    Wt Readings from Last 1 Encounters:  09/20/16 96.1 kg (211 lb 12.8 oz)    Weight change:   HEENT: [place]/AT, Eyes-Brown, PERL, EOMI, Conjunctiva-Pink, Sclera-Non-icteric Neck: No JVD, No bruit, Trachea midline. Lungs:  Clearing, Bilateral. Cardiac:  Regular rhythm, normal S1 and S2, no S3. II/VI systolic murmur. Abdomen:  Soft, non-tender. Extremities:  No edema present. No cyanosis. No clubbing. CNS: AxOx3, Cranial nerves grossly intact, moves all 4 extremities. Right handed. Skin: Warm and dry.   Intake/Output from previous day: 10/02 0701 - 10/03 0700 In: 720 [P.O.:720] Out: -     Lab Results: BMET    Component Value Date/Time   NA 139 09/18/2016 0230   NA 135 09/17/2016 1650   NA 142 08/05/2016 1115   NA 141 03/25/2016 1115   NA 140 01/11/2016 0437   NA 144 12/16/2015 1635   K 3.8 09/18/2016 0230   K 3.9 09/17/2016 1650   K 4.3 08/05/2016 1115   CL 105 09/18/2016 0230   CL 101 09/17/2016 1650   CL 103 08/05/2016 1115   CO2 27 09/18/2016 0230   CO2 26 09/17/2016 1650   CO2 22 08/05/2016 1115   GLUCOSE 101 (H) 09/18/2016 0230   GLUCOSE 99 09/17/2016 1650   GLUCOSE 127 (H) 08/05/2016 1115   GLUCOSE 138 (H) 03/25/2016 1115   GLUCOSE 108 (H) 01/11/2016 0437   BUN 31 (H) 09/18/2016 0230   BUN 32 (H) 09/17/2016 1650   BUN 25 08/05/2016 1115   BUN 31 (H) 03/25/2016 1115   BUN 34 (H) 01/11/2016 0437   BUN 30 (H)  12/16/2015 1635   CREATININE 3.30 (H) 09/18/2016 0230   CREATININE 3.38 (H) 09/17/2016 1650   CREATININE 2.69 (H) 08/05/2016 1115   CREATININE 1.54 (H) 05/28/2014 1046   CREATININE 1.43 (H) 03/05/2014 1532   CREATININE 1.47 (H) 12/30/2013 1532   CALCIUM 8.3 (L) 09/18/2016 0230   CALCIUM 8.3 (L) 09/17/2016 1650   CALCIUM 8.5 (L) 08/05/2016 1115   GFRNONAA 18 (L) 09/18/2016 0230   GFRNONAA 17 (L) 09/17/2016 1650   GFRNONAA 23 (L) 08/05/2016 1115   GFRNONAA 46 (L) 05/28/2014 1046   GFRNONAA 50 (L) 03/05/2014 1532   GFRNONAA 55 (L) 11/18/2013 1101   GFRAA 20 (L) 09/18/2016 0230   GFRAA 20 (L) 09/17/2016 1650   GFRAA 27 (L) 08/05/2016 1115   GFRAA 53 (L) 05/28/2014 1046   GFRAA 58 (L) 03/05/2014 1532   GFRAA 64 11/18/2013 1101   CBC    Component Value Date/Time   WBC 3.6 (L) 09/18/2016 0230   RBC 3.35 (L) 09/18/2016 0230   HGB 9.1 (L) 09/18/2016 0230   HCT 29.1 (L) 09/18/2016 0230   HCT 27.5 (L) 08/05/2016 1115   PLT 170 09/18/2016 0230   PLT 161 08/05/2016 1115   MCV 86.9 09/18/2016 0230   MCV 85 08/05/2016 1115   MCH 27.2  09/18/2016 0230   MCHC 31.3 09/18/2016 0230   RDW 15.6 (H) 09/18/2016 0230   RDW 17.7 (H) 08/05/2016 1115   LYMPHSABS 1.4 09/17/2016 1650   LYMPHSABS 1.4 08/05/2016 1115   MONOABS 0.2 09/17/2016 1650   EOSABS 0.1 09/17/2016 1650   EOSABS 0.0 08/05/2016 1115   BASOSABS 0.0 09/17/2016 1650   BASOSABS 0.0 08/05/2016 1115   HEPATIC Function Panel  Recent Labs  12/16/15 1635 01/06/16 1718 09/17/16 1650  PROT 6.8 7.0 7.1   HEMOGLOBIN A1C No components found for: HGA1C,  MPG CARDIAC ENZYMES Lab Results  Component Value Date   CKTOTAL 64 05/26/2009   CKMB 0.7 05/26/2009   TROPONINI 0.04 (H) 01/07/2016   TROPONINI 0.04 (H) 01/06/2016   TROPONINI 0.03 01/06/2016   BNP No results for input(s): PROBNP in the last 8760 hours. TSH  Recent Labs  01/06/16 1718  TSH 1.179   CHOLESTEROL No results for input(s): CHOL in the last 8760  hours.  Scheduled Meds: . amLODipine  10 mg Oral Daily  . atorvastatin  20 mg Oral Daily  . cefTRIAXone (ROCEPHIN)  IV  1 g Intravenous Q24H  . docusate sodium  100 mg Oral BID  . ferrous sulfate  325 mg Oral Q breakfast  . furosemide  80 mg Oral Daily  . guaiFENesin  600 mg Oral BID  . lisinopril  10 mg Oral Daily  . metoprolol succinate  100 mg Oral Daily  . multivitamin with minerals  1 tablet Oral Daily  . pantoprazole  40 mg Oral Daily  . sodium chloride flush  3 mL Intravenous Q12H  . terazosin  5 mg Oral QHS   Continuous Infusions: . sodium chloride 50 mL/hr at 09/20/16 0753   PRN Meds:.sodium chloride, acetaminophen **OR** acetaminophen, albuterol, ondansetron **OR** ondansetron (ZOFRAN) IV, sodium chloride flush  Assessment/Plan: Right lung pneumonia CKD IV Hypertension DM, II Obesity Hyperlipidemia CAD CABG PVD Tobacco use disorder Alcohol use disorder  Continue medical treatment   LOS: 3 days    Dixie Dials  MD  09/20/2016, 9:25 AM

## 2016-09-20 NOTE — Care Management Important Message (Signed)
Important Message  Patient Details  Name: Kevin Castillo MRN: [id number] Date of Birth: 1946-01-01   Medicare Important Message Given:  Yes    Alica Shellhammer Abena 09/20/2016, 11:28 AM

## 2016-09-21 LAB — GLUCOSE, CAPILLARY
GLUCOSE-CAPILLARY: 151 mg/dL — AB (ref 65–99)
Glucose-Capillary: 160 mg/dL — ABNORMAL HIGH (ref 65–99)
Glucose-Capillary: 87 mg/dL (ref 65–99)
Glucose-Capillary: 104 mg/dL — ABNORMAL HIGH (ref 65–99)

## 2016-09-21 LAB — CBC
HCT: 31 % — ABNORMAL LOW (ref 39.0–52.0)
Hemoglobin: 9.6 g/dL — ABNORMAL LOW (ref 13.0–17.0)
MCH: 26.9 pg (ref 26.0–34.0)
MCHC: 31 g/dL (ref 30.0–36.0)
MCV: 86.8 fL (ref 78.0–100.0)
PLATELETS: 211 10*3/uL (ref 150–400)
RBC: 3.57 MIL/uL — AB (ref 4.22–5.81)
RDW: 15.6 % — ABNORMAL HIGH (ref 11.5–15.5)
WBC: 4 10*3/uL (ref 4.0–10.5)

## 2016-09-21 LAB — LIPID PANEL
CHOL/HDL RATIO: 2.3 ratio
CHOLESTEROL: 131 mg/dL (ref 0–200)
HDL: 58 mg/dL (ref >40)
LDL Cholesterol: 68 mg/dL (ref 0–99)
Triglycerides: 23 mg/dL (ref <150)
VLDL: 5 mg/dL (ref 0–40)

## 2016-09-21 LAB — BASIC METABOLIC PANEL
ANION GAP: 5 (ref 5–15)
BUN: 30 mg/dL — ABNORMAL HIGH (ref 6–20)
CO2: 26 mmol/L (ref 22–32)
CREATININE: 2.96 mg/dL — AB (ref 0.61–1.24)
Calcium: 8.5 mg/dL — ABNORMAL LOW (ref 8.9–10.3)
Chloride: 106 mmol/L (ref 101–111)
GFR calc Af Amer: 23 mL/min — ABNORMAL LOW (ref >60)
GFR calc non Af Amer: 20 mL/min — ABNORMAL LOW (ref >60)
GLUCOSE: 203 mg/dL — AB (ref 65–99)
POTASSIUM: 4.5 mmol/L (ref 3.5–5.1)
Sodium: 137 mmol/L (ref 135–145)

## 2016-09-21 MED ORDER — LEVOFLOXACIN 500 MG PO TABS: 500.0000 mg | ORAL_TABLET | Freq: Every day | ORAL | 0 refills | Status: DC

## 2016-09-21 MED ORDER — DEXTROSE 5 % IV SOLN
1.0000 g | INTRAVENOUS | Status: AC
  Administered 2016-09-21: 1 g via INTRAVENOUS
  Filled 2016-09-21: qty 10

## 2016-09-21 MED ORDER — ALBUTEROL SULFATE HFA 108 (90 BASE) MCG/ACT IN AERS: 2.0000 | INHALATION_SPRAY | Freq: Four times a day (QID) | RESPIRATORY_TRACT | 3 refills | Status: DC | PRN

## 2016-09-21 MED ORDER — GUAIFENESIN ER 600 MG PO TB12: 600.0000 mg | ORAL_TABLET | Freq: Two times a day (BID) | ORAL | 0 refills | Status: DC

## 2016-09-21 NOTE — Discharge Summary (Signed)
Physician Discharge Summary  Patient ID: Kevin Castillo MRN: [id number] DOB/AGE: 1946-07-31 70 y.o.  Admit date: 09/17/2016 Discharge date: 09/21/2016  Admission Diagnoses: Right lung pneumonia CKD IV Hypertension DM, II, diet controlled Obesity Hyperlipidemia CAD CABG PVD Tobacco use disorder Alcohol use disorder  Discharge Diagnoses:  Principal Problem:   Pneumonia involving right lung Active Problems:   Essential hypertension   Type 2 diabetes mellitus with stage 4 chronic kidney disease (HCC)   Hyperlipidemia   Obesity (BMI 30.0-34.9)   Iron deficiency anemia   CAD   CABG   PVD   Tobacco use disorder   Alcohol use disorder  Discharged Condition: fair  Hospital Course: 70 year old male with PMH of DM, II, CKD IV, Hypertension, CAD, CABG, Obesity, Tobacco and alcohol use disorder has chronic cough x 3 weeks. He received IV followed by oral antibiotics with slow and steady improvement over 4 days. He was made aware of his blood sugars on rise and may need resumption of small dose of insulin. Patient wants to try additional diet and increased activity to control sugar. He will see me in 1 week and see primary care in 1 month.  Consults: None  Significant Diagnostic Studies: labs: Chronic leukopenia, Hgb 9.8 g/dL. BMET -near normal except elevated sugar at times and BUN/Cr of 32/3.8, marginally improving with hydration. Lipid panel normal; with LDL cholesterol of 68 and HDL cholesterol of 58 mg/dL. Hgb A1C result was pending.  Treatments: antibiotics: Levaquin, ceftriaxone and azithromycin  Discharge Exam: Blood pressure (!) 151/80, pulse 64, temperature 97.5 F (36.4 C), temperature source Oral, resp. rate 18, weight 97.2 kg (214 lb 3.2 oz), SpO2 100 %. General appearance: alert, cooperative, appears stated age and mild distress. Head: Normocephalic, atraumatic. Eyes: Brown eyes, Pale pink conjunctivae. Corneas clear. Muddy Sclera. PERRL, EOM's intact.  Neck: No  adenopathy, no carotid bruit, no JVD, supple, symmetrical, trachea midline and thyroid not enlarged. Resp: clearing to auscultation bilaterally. Cardio: regular rate and rhythm, S1, S2 normal, no click, rub or gallop. II/VI systolic murmur. GI: soft, non-tender; bowel sounds normal; no masses,  no organomegaly Extremities: extremities normal, no cyanosis or edema Skin: Warm and dry No rashes or lesions Neurologic: Alert and oriented X 3, normal strength and tone. Normal coordination and gait.  Disposition: 01 Home or self care.     Medication List    STOP taking these medications   lisinopril 10 MG tablet Commonly known as:  PRINIVIL,ZESTRIL     TAKE these medications   albuterol 108 (90 Base) MCG/ACT inhaler Commonly known as:  PROVENTIL HFA;VENTOLIN HFA Inhale 2 puffs into the lungs every 6 (six) hours as needed for wheezing or shortness of breath.   amLODipine 10 MG tablet Commonly known as:  NORVASC TAKE 1 TABLET (10 MG TOTAL) BY MOUTH DAILY.   atorvastatin 20 MG tablet Commonly known as:  LIPITOR TAKE 1 TABLET EVERY DAY What changed:  See the new instructions.   ferrous sulfate 325 (65 FE) MG tablet Take 1 tablet (325 mg total) by mouth daily with breakfast.   furosemide 80 MG tablet Commonly known as:  LASIX Take 1 tablet (80 mg total) by mouth daily.   guaiFENesin 600 MG 12 hr tablet Commonly known as:  MUCINEX Take 1 tablet (600 mg total) by mouth 2 (two) times daily.   levofloxacin 500 MG tablet Commonly known as:  LEVAQUIN Take 1 tablet (500 mg total) by mouth daily.   metoprolol succinate 100 MG 24 hr tablet Commonly  known as:  TOPROL-XL Take 1 tablet (100 mg total) by mouth daily.   pantoprazole 40 MG tablet Commonly known as:  PROTONIX Take 1 tablet (40 mg total) by mouth daily.   terazosin 5 MG capsule Commonly known as:  HYTRIN TAKE 1 CAPSULE AT BEDTIME What changed:  See the new instructions.      Follow-up Information    Karren Cobble, MD. Schedule an appointment as soon as possible for a visit in 1 month(s).   Specialty:  Internal Medicine Contact information: [address] [place] [address] [phone]        Birdie Riddle, MD. Schedule an appointment as soon as possible for a visit in 1 week(s).   Specialty:  Cardiology Contact information: [place] [address] [phone]           Signed: Birdie Riddle 09/21/2016, 6:41 PM

## 2016-09-22 DIAGNOSIS — D631 Anemia in chronic kidney disease: Secondary | ICD-10-CM | POA: Diagnosis not present

## 2016-09-22 LAB — HEMOGLOBIN A1C
Hgb A1c MFr Bld: 6.5 % — ABNORMAL HIGH (ref 4.8–5.6)
MEAN PLASMA GLUCOSE: 140 mg/dL

## 2016-09-26 ENCOUNTER — Telehealth: Payer: Self-pay | Admitting: Pharmacist

## 2016-09-30 NOTE — Progress Notes (Signed)
Tried contacting patient for medication review post hospital discharge. Unable to reach.

## 2016-10-03 DIAGNOSIS — E1140 Type 2 diabetes mellitus with diabetic neuropathy, unspecified: Secondary | ICD-10-CM | POA: Diagnosis not present

## 2016-10-03 DIAGNOSIS — E784 Other hyperlipidemia: Secondary | ICD-10-CM | POA: Diagnosis not present

## 2016-10-03 DIAGNOSIS — I10 Essential (primary) hypertension: Secondary | ICD-10-CM | POA: Diagnosis not present

## 2016-10-03 DIAGNOSIS — D500 Iron deficiency anemia secondary to blood loss (chronic): Secondary | ICD-10-CM | POA: Diagnosis not present

## 2016-10-12 DIAGNOSIS — L603 Nail dystrophy: Secondary | ICD-10-CM | POA: Diagnosis not present

## 2016-10-12 DIAGNOSIS — E1151 Type 2 diabetes mellitus with diabetic peripheral angiopathy without gangrene: Secondary | ICD-10-CM | POA: Diagnosis not present

## 2016-10-12 DIAGNOSIS — L84 Corns and callosities: Secondary | ICD-10-CM | POA: Diagnosis not present

## 2016-10-12 DIAGNOSIS — I739 Peripheral vascular disease, unspecified: Secondary | ICD-10-CM | POA: Diagnosis not present

## 2016-10-28 ENCOUNTER — Encounter: Payer: Self-pay | Admitting: Internal Medicine

## 2016-10-28 ENCOUNTER — Ambulatory Visit (HOSPITAL_COMMUNITY)
Admission: RE | Admit: 2016-10-28 | Discharge: 2016-10-28 | Disposition: A | Discharge status: Home / Self Care | Payer: Commercial Managed Care - HMO | Source: Ambulatory Visit | Attending: Internal Medicine | Admitting: Internal Medicine

## 2016-10-28 ENCOUNTER — Ambulatory Visit (INDEPENDENT_AMBULATORY_CARE_PROVIDER_SITE_OTHER): Payer: Commercial Managed Care - HMO | Admitting: Internal Medicine

## 2016-10-28 VITALS — BP 158/68 | HR 66 | Temp 97.8°F | Wt 213.4 lb

## 2016-10-28 DIAGNOSIS — F17210 Nicotine dependence, cigarettes, uncomplicated: Secondary | ICD-10-CM | POA: Diagnosis not present

## 2016-10-28 DIAGNOSIS — Z23 Encounter for immunization: Secondary | ICD-10-CM

## 2016-10-28 DIAGNOSIS — N401 Enlarged prostate with lower urinary tract symptoms: Secondary | ICD-10-CM

## 2016-10-28 DIAGNOSIS — I739 Peripheral vascular disease, unspecified: Secondary | ICD-10-CM

## 2016-10-28 DIAGNOSIS — D509 Iron deficiency anemia, unspecified: Secondary | ICD-10-CM | POA: Diagnosis not present

## 2016-10-28 DIAGNOSIS — N184 Chronic kidney disease, stage 4 (severe): Secondary | ICD-10-CM

## 2016-10-28 DIAGNOSIS — D508 Other iron deficiency anemias: Secondary | ICD-10-CM

## 2016-10-28 DIAGNOSIS — R351 Nocturia: Secondary | ICD-10-CM

## 2016-10-28 DIAGNOSIS — Z720 Tobacco use: Secondary | ICD-10-CM

## 2016-10-28 DIAGNOSIS — E113213 Type 2 diabetes mellitus with mild nonproliferative diabetic retinopathy with macular edema, bilateral: Secondary | ICD-10-CM

## 2016-10-28 DIAGNOSIS — E7849 Other hyperlipidemia: Secondary | ICD-10-CM

## 2016-10-28 DIAGNOSIS — I130 Hypertensive heart and chronic kidney disease with heart failure and stage 1 through stage 4 chronic kidney disease, or unspecified chronic kidney disease: Secondary | ICD-10-CM | POA: Diagnosis not present

## 2016-10-28 DIAGNOSIS — N320 Bladder-neck obstruction: Secondary | ICD-10-CM

## 2016-10-28 DIAGNOSIS — Z09 Encounter for follow-up examination after completed treatment for conditions other than malignant neoplasm: Secondary | ICD-10-CM

## 2016-10-28 DIAGNOSIS — R339 Retention of urine, unspecified: Secondary | ICD-10-CM

## 2016-10-28 DIAGNOSIS — I10 Essential (primary) hypertension: Secondary | ICD-10-CM

## 2016-10-28 DIAGNOSIS — I5022 Chronic systolic (congestive) heart failure: Secondary | ICD-10-CM | POA: Diagnosis not present

## 2016-10-28 DIAGNOSIS — E1122 Type 2 diabetes mellitus with diabetic chronic kidney disease: Secondary | ICD-10-CM

## 2016-10-28 DIAGNOSIS — F1010 Alcohol abuse, uncomplicated: Secondary | ICD-10-CM

## 2016-10-28 DIAGNOSIS — I25119 Atherosclerotic heart disease of native coronary artery with unspecified angina pectoris: Secondary | ICD-10-CM

## 2016-10-28 DIAGNOSIS — J189 Pneumonia, unspecified organism: Secondary | ICD-10-CM

## 2016-10-28 DIAGNOSIS — F1020 Alcohol dependence, uncomplicated: Secondary | ICD-10-CM

## 2016-10-28 DIAGNOSIS — Z8701 Personal history of pneumonia (recurrent): Secondary | ICD-10-CM

## 2016-10-28 DIAGNOSIS — E785 Hyperlipidemia, unspecified: Secondary | ICD-10-CM | POA: Diagnosis not present

## 2016-10-28 DIAGNOSIS — E1151 Type 2 diabetes mellitus with diabetic peripheral angiopathy without gangrene: Secondary | ICD-10-CM

## 2016-10-28 DIAGNOSIS — Z0000 Encounter for general adult medical examination without abnormal findings: Secondary | ICD-10-CM

## 2016-10-28 DIAGNOSIS — Z79899 Other long term (current) drug therapy: Secondary | ICD-10-CM

## 2016-10-28 DIAGNOSIS — I25719 Atherosclerosis of autologous vein coronary artery bypass graft(s) with unspecified angina pectoris: Secondary | ICD-10-CM

## 2016-10-28 DIAGNOSIS — J181 Lobar pneumonia, unspecified organism: Principal | ICD-10-CM

## 2016-10-28 HISTORY — DX: Type 2 diabetes mellitus with mild nonproliferative diabetic retinopathy with macular edema, bilateral: E11.3213

## 2016-10-28 MED ORDER — TERAZOSIN HCL 10 MG PO CAPS: 10.0000 mg | ORAL_CAPSULE | Freq: Every day | ORAL | 3 refills | Status: DC

## 2016-10-28 NOTE — Progress Notes (Signed)
   Subjective:    Patient ID: Kevin Castillo, male    DOB: 14-Mar-1946, 70 y.o.   MRN: [id number]  HPI  Kevin Castillo is here for hospital follow-up for a questionable right sided pneumonia, and assessment of his diabetes complicated by stage 4 chronic kidney disease, hypertension, and chronic systolic heart failure. Please see the A&P for the status of the pt's chronic medical problems.  Review of Systems  Constitutional: Negative for activity change, appetite change and unexpected weight change.  Respiratory: Negative for chest tightness, shortness of breath and wheezing.   Cardiovascular: Negative for chest pain, palpitations and leg swelling.  Gastrointestinal: Negative for abdominal distention, abdominal pain, constipation, diarrhea, nausea and vomiting.  Genitourinary: Positive for frequency and urgency.  Musculoskeletal: Negative for arthralgias, back pain, joint swelling and myalgias.  Skin: Negative for color change, rash and wound.  Neurological: Negative for dizziness and light-headedness.      Objective:   Physical Exam  Constitutional: He is oriented to person, place, and time. He appears well-developed and well-nourished. No distress.  HENT:  Head: Normocephalic and atraumatic.  Eyes: Conjunctivae are normal. Right eye exhibits no discharge. Left eye exhibits no discharge. No scleral icterus.  Cardiovascular: Normal rate, regular rhythm and normal heart sounds.  Exam reveals no gallop and no friction rub.   No murmur heard. Pulmonary/Chest: Effort normal. No respiratory distress. He has no wheezes. He has no rales.  Abdominal: He exhibits no distension. There is no tenderness. There is no rebound and no guarding.  Musculoskeletal: Normal range of motion. He exhibits no edema, tenderness or deformity.  Neurological: He is alert and oriented to person, place, and time. He exhibits normal muscle tone.  Skin: Skin is warm and dry. No rash noted. He is not diaphoretic. No  erythema.  Psychiatric: He has a normal mood and affect. His behavior is normal. Judgment and thought content normal.  Nursing note and vitals reviewed.     Assessment & Plan:   Please see problem oriented charting.

## 2016-10-28 NOTE — Assessment & Plan Note (Signed)
Assessment  He has not had any chest pain on his current antianginal regimen which includes amlodipine 10 mg by mouth daily and Toprol-XL 100 mg by mouth daily. We are currently holding the aspirin given the iron deficiency anemia although this may be restarted when the hemoglobin and ferritin results from this morning's blood draw.  Plan  We will continue the amlodipine at 10 mg by mouth daily and Toprol-XL at 100 mg by mouth daily. If he no longer has an iron deficiency anemia and his hemoglobin remains stable we will consider restarting the aspirin at 81 mg by mouth daily. We will reassess for evidence of angina at the follow-up visit.

## 2016-10-28 NOTE — Assessment & Plan Note (Signed)
Assessment  His chronic systolic heart failure seems well compensated at this time and is markedly improved from the previous visit. This is despite his ACE inhibitor being discontinued during the hospitalization, presumably because of his stage IV chronic kidney disease and his currently increased afterload. His current cardiomyopathy regimen includes Toprol-XL 100 mg by mouth daily and Lasix 80 mg by mouth daily.  Plan  We will continue the Toprol-XL and Lasix at the current doses and reassess for evidence of decompensation of his chronic systolic cardiomyopathy at the follow-up visit. His afterload is being addressed with a higher dose of alpha blocker therapy.

## 2016-10-28 NOTE — Assessment & Plan Note (Signed)
Assessment  [name] notes increased nocturia despite the terazosin 5 mg by mouth nightly. He was not interested in a rectal examination 1 I recommended we check the status of his prostate. We therefore decided to adjust his alpha blocker therapy, but if this failed to improve his nocturia I mentioned the importance of a rectal examination.  Plan  The  terazosin was increased to 10 mg by mouth daily. We will reassess the efficacy of this therapy in decreasing his nocturia from the bladder outlet obstruction with urinary retention associated with the presumed benign prostatic hypertrophy.

## 2016-10-28 NOTE — Assessment & Plan Note (Signed)
Assessment  He states that he is drinking much less as of late. He is unable to give me the specifics.  Plan  He was praised for his efforts to stop drinking heavily. We will reassess the success in this area at the follow-up visit.

## 2016-10-28 NOTE — Assessment & Plan Note (Signed)
Assessment  He is tolerating the atorvastatin 20 mg by mouth daily without myalgias.  Plan  We will continue this moderate to high intensity statin and reassess for evidence of intolerance is at the follow-up visit.

## 2016-10-28 NOTE — Assessment & Plan Note (Signed)
Assessment  Kevin Castillo is clinically improved after treatment of his possible right lung pneumonia. He states his breathing is back at baseline and a chest x-ray reveals resolution of the right sided infiltrate. He does have chronic right-sided scarring which is stable.  Plan  No further evaluation or therapy is required given the radiographic and clinical resolution of this presumed right-sided pneumonia.

## 2016-10-28 NOTE — Assessment & Plan Note (Signed)
Assessment  We spent more than 4 minutes discussing the importance of tobacco cessation given his cardiovascular status. He states that he is down to 2-3 cigarettes per day. He was offered pharmacologic therapy to assist at complete cessation but states he does not feel he needs it. He will continue to work on complete tobacco cessation.  Plan  We will reassess his success at complete tobacco cessation at the follow-up visit. He was praised for his continued efforts and progress in this area.

## 2016-10-28 NOTE — Assessment & Plan Note (Signed)
Assessment  His blood pressure today was elevated at 158/68. This is on amlodipine 10 mg by mouth daily, Toprol-XL 100 mg by mouth daily, terazosin 5 mg by mouth nightly, and furosemide 80 mg by mouth each morning.  Plan  As he is having increased nocturia we decided to increase the terazosin to 10 mg by mouth nightly. This should also address the hypertension. We are continuing the amlodipine at 10 mg by mouth daily, Toprol-XL 100 mg by mouth daily, and furosemide at 40 mg by mouth each morning. The importance of taking the furosemide early in the morning was stressed so he did not have worsening of his nocturia secondary to this diuretic when he went to bed at night.

## 2016-10-28 NOTE — Assessment & Plan Note (Signed)
Assessment  Kevin Castillo's diabetes is well controlled based upon a hemoglobin A1c obtained 1 month ago. At that time it was 6.5. This is on no hypoglycemic medications.  Plan  He was praised on the control of his diabetes with lifestyle modification alone. We will continue to monitor with a repeat hemoglobin A1c at the follow-up visit. With regards to his stage IV chronic kidney disease a basic metabolic panel was not checked at this time and he will continue to follow with Dr. Justin Mend his nephrologist.

## 2016-10-28 NOTE — Assessment & Plan Note (Signed)
He received the flu vaccination today. For his diabetic health maintenance he said that he was going to schedule an eye appointment for follow-up.

## 2016-10-28 NOTE — Assessment & Plan Note (Signed)
Assessment  We are reassessing his iron deficiency anemia with a CBC and ferritin today. The results of these labs are pending at the time of this dictation.  Plan  Further assessment of his recent iron deficiency anemia will be guided by the results of the CBC and ferritin drawn today, once resulted If his iron deficiency anemia has resolved we will restart his aspirin at 81 mg by mouth daily.

## 2016-10-28 NOTE — Patient Instructions (Signed)
It was good to see you again.  1) I checked some blood work today.  I will call you with the results early next week.  2) We got a chest X-ray today to make sure the pneumonia went away.  If there is anything concerning I will give you a call.  3) We gave you the flu shot today.  4) Keep working on quitting smoking and drinking.  I think this will be best for your heart.  5) I increased the terazosin to 10 mg each night.  This is to shrink your prostate so you urinate better and don't have to get up as much at night.  6)  Make sure you go to the eye examination when they schedule it.  7) Keep taking the other medications as you are.  I will see you back in 3 months, sooner if necessary.

## 2016-10-28 NOTE — Assessment & Plan Note (Signed)
Assessment  He continues to note right calf pain with walking which resolves after several minutes of rest. This is concerning for progression of his peripheral vascular occlusive disease.  Plan  We will continue aggressive cardiovascular risk factor modification that includes control of his hypertension, hyperlipidemia, and diabetes. We have also made a referral for ankle-brachial indices to see if it has progressed since the last study in 2015. We will reassess his claudication at the follow-up visit, but he is not a candidate for cilostazol given his underlying systolic heart failure.

## 2016-10-29 LAB — CBC
HEMOGLOBIN: 9.9 g/dL — AB (ref 12.6–17.7)
Hematocrit: 29.2 % — ABNORMAL LOW (ref 37.5–51.0)
MCH: 27.2 pg (ref 26.6–33.0)
MCHC: 33.9 g/dL (ref 31.5–35.7)
MCV: 80 fL (ref 79–97)
PLATELETS: 186 10*3/uL (ref 150–379)
RBC: 3.64 x10E6/uL — AB (ref 4.14–5.80)
RDW: 17.5 % — ABNORMAL HIGH (ref 12.3–15.4)
WBC: 3.6 10*3/uL (ref 3.4–10.8)

## 2016-10-29 LAB — FERRITIN: FERRITIN: 52 ng/mL (ref 30–400)

## 2016-11-08 NOTE — Progress Notes (Signed)
Patient ID: Kevin Castillo, male   DOB: 12-19-1946, 70 y.o.   MRN: [id number]  Hgb 9.9 Stable  Ferritin 52  We will restart the ASA 81 mg daily and discontinue the iron supplementation.  I called [name] with the results and recommendations and he is in agreement with the above.

## 2016-11-09 ENCOUNTER — Other Ambulatory Visit: Payer: Self-pay | Admitting: Internal Medicine

## 2016-11-09 MED ORDER — ASPIRIN EC 81 MG PO TBEC: 81.0000 mg | DELAYED_RELEASE_TABLET | Freq: Every day | ORAL | 3 refills | Status: DC

## 2016-11-16 DIAGNOSIS — N184 Chronic kidney disease, stage 4 (severe): Secondary | ICD-10-CM | POA: Diagnosis not present

## 2016-11-16 DIAGNOSIS — E1129 Type 2 diabetes mellitus with other diabetic kidney complication: Secondary | ICD-10-CM | POA: Diagnosis not present

## 2016-11-16 DIAGNOSIS — E785 Hyperlipidemia, unspecified: Secondary | ICD-10-CM | POA: Diagnosis not present

## 2016-11-16 DIAGNOSIS — Z6832 Body mass index (BMI) 32.0-32.9, adult: Secondary | ICD-10-CM | POA: Diagnosis not present

## 2016-11-16 DIAGNOSIS — D631 Anemia in chronic kidney disease: Secondary | ICD-10-CM | POA: Diagnosis not present

## 2016-11-16 DIAGNOSIS — I739 Peripheral vascular disease, unspecified: Secondary | ICD-10-CM | POA: Diagnosis not present

## 2016-11-16 DIAGNOSIS — N2581 Secondary hyperparathyroidism of renal origin: Secondary | ICD-10-CM | POA: Diagnosis not present

## 2016-12-15 DIAGNOSIS — I10 Essential (primary) hypertension: Secondary | ICD-10-CM | POA: Diagnosis not present

## 2016-12-15 DIAGNOSIS — E784 Other hyperlipidemia: Secondary | ICD-10-CM | POA: Diagnosis not present

## 2016-12-15 DIAGNOSIS — E1140 Type 2 diabetes mellitus with diabetic neuropathy, unspecified: Secondary | ICD-10-CM | POA: Diagnosis not present

## 2016-12-15 DIAGNOSIS — J208 Acute bronchitis due to other specified organisms: Secondary | ICD-10-CM | POA: Diagnosis not present

## 2016-12-15 DIAGNOSIS — D500 Iron deficiency anemia secondary to blood loss (chronic): Secondary | ICD-10-CM | POA: Diagnosis not present

## 2017-02-03 ENCOUNTER — Ambulatory Visit: Payer: Commercial Managed Care - HMO | Admitting: Internal Medicine

## 2017-02-04 NOTE — Progress Notes (Signed)
Mr. Reihl was called as it was very unusual for him to miss an appointment.  He states he simply forgot he had an appointment.  He has been rescheduled.

## 2017-02-06 ENCOUNTER — Ambulatory Visit (HOSPITAL_COMMUNITY): Admission: RE | Admit: 2017-02-06 | Payer: Medicare HMO | Source: Ambulatory Visit

## 2017-02-09 ENCOUNTER — Ambulatory Visit (HOSPITAL_COMMUNITY): Payer: Medicare HMO

## 2017-02-13 ENCOUNTER — Other Ambulatory Visit: Payer: Self-pay | Admitting: Internal Medicine

## 2017-02-13 ENCOUNTER — Ambulatory Visit (HOSPITAL_COMMUNITY)
Admission: RE | Admit: 2017-02-13 | Discharge: 2017-02-13 | Disposition: A | Discharge status: Home / Self Care | Payer: Medicare HMO | Source: Ambulatory Visit | Attending: Internal Medicine | Admitting: Internal Medicine

## 2017-02-13 ENCOUNTER — Telehealth: Payer: Self-pay

## 2017-02-13 ENCOUNTER — Ambulatory Visit (HOSPITAL_BASED_OUTPATIENT_CLINIC_OR_DEPARTMENT_OTHER)
Admission: RE | Admit: 2017-02-13 | Discharge: 2017-02-13 | Disposition: A | Discharge status: Home / Self Care | Payer: Medicare HMO | Source: Ambulatory Visit | Attending: Internal Medicine | Admitting: Internal Medicine

## 2017-02-13 DIAGNOSIS — I739 Peripheral vascular disease, unspecified: Secondary | ICD-10-CM

## 2017-02-13 DIAGNOSIS — I70203 Unspecified atherosclerosis of native arteries of extremities, bilateral legs: Secondary | ICD-10-CM | POA: Insufficient documentation

## 2017-02-13 DIAGNOSIS — R52 Pain, unspecified: Secondary | ICD-10-CM | POA: Diagnosis not present

## 2017-02-13 DIAGNOSIS — I10 Essential (primary) hypertension: Secondary | ICD-10-CM | POA: Diagnosis not present

## 2017-02-13 NOTE — Progress Notes (Signed)
VASCULAR LAB PRELIMINARY  ARTERIAL  Bilateral  Lower extremithy duplex revealed moderate to severe calcific and non calcific plaque bilaterally. Right -  There is an occlusion of the superficial femoral artery in the proximal to mid region continuing distally. Minimal attempt of collateralized noted. There is recannaliztion into the popliteal artery. The left Lower extremity duplex revealed a greater than 50% stenosis of the mid to distal superficial femoral artery and a near occluded distal superficial femoral artery.       Kyesha Balla, RVS 02/13/2017, 4:59 PM

## 2017-02-13 NOTE — Progress Notes (Signed)
VASCULAR LAB PRELIMINARY  ARTERIAL  ABI completed:    RIGHT    LEFT    PRESSURE WAVEFORM  PRESSURE WAVEFORM  BRACHIAL 209 Biphasic BRACHIAL 207 Biphasic  AT 69 Severely Dampened Monophasic AT 103 Dampened Monophasic  PT >255 Monophasic PT 136 Monophasic  GREAT TOE Flat NA GREAT TOE 45 NA    RIGHT LEFT  ABI / TBI N/A/ O 0.65 / 0.22   Right ABI could not be obtained due to non compressible vessels. Great toe pressure could not be obtained due to absence of waveform. Left - ABi indicates a moderate reduction in arterial flow at rest. The TBI on the left indicates abnormal flow.   Linsi Humann, [place], RVS 02/13/2017, 4:41 PM

## 2017-02-13 NOTE — Telephone Encounter (Signed)
Call from vascular lab patient's ABI abnormal need order to perform duplex study unable to reach Dr. Eppie Gibson verbal order given by Dr. Lynnae January for Duplex study.

## 2017-02-17 ENCOUNTER — Ambulatory Visit (INDEPENDENT_AMBULATORY_CARE_PROVIDER_SITE_OTHER): Payer: Medicare HMO | Admitting: Internal Medicine

## 2017-02-17 ENCOUNTER — Encounter: Payer: Self-pay | Admitting: Internal Medicine

## 2017-02-17 VITALS — BP 180/92 | HR 76 | Temp 97.6°F | Wt 210.7 lb

## 2017-02-17 DIAGNOSIS — E669 Obesity, unspecified: Secondary | ICD-10-CM | POA: Diagnosis not present

## 2017-02-17 DIAGNOSIS — Z720 Tobacco use: Secondary | ICD-10-CM

## 2017-02-17 DIAGNOSIS — Z79899 Other long term (current) drug therapy: Secondary | ICD-10-CM

## 2017-02-17 DIAGNOSIS — N401 Enlarged prostate with lower urinary tract symptoms: Secondary | ICD-10-CM

## 2017-02-17 DIAGNOSIS — I25719 Atherosclerosis of autologous vein coronary artery bypass graft(s) with unspecified angina pectoris: Secondary | ICD-10-CM

## 2017-02-17 DIAGNOSIS — I5022 Chronic systolic (congestive) heart failure: Secondary | ICD-10-CM | POA: Diagnosis not present

## 2017-02-17 DIAGNOSIS — E1151 Type 2 diabetes mellitus with diabetic peripheral angiopathy without gangrene: Secondary | ICD-10-CM

## 2017-02-17 DIAGNOSIS — I739 Peripheral vascular disease, unspecified: Secondary | ICD-10-CM

## 2017-02-17 DIAGNOSIS — E7849 Other hyperlipidemia: Secondary | ICD-10-CM

## 2017-02-17 DIAGNOSIS — N184 Chronic kidney disease, stage 4 (severe): Secondary | ICD-10-CM

## 2017-02-17 DIAGNOSIS — E1122 Type 2 diabetes mellitus with diabetic chronic kidney disease: Secondary | ICD-10-CM

## 2017-02-17 DIAGNOSIS — E785 Hyperlipidemia, unspecified: Secondary | ICD-10-CM

## 2017-02-17 DIAGNOSIS — J309 Allergic rhinitis, unspecified: Secondary | ICD-10-CM | POA: Diagnosis not present

## 2017-02-17 DIAGNOSIS — F17210 Nicotine dependence, cigarettes, uncomplicated: Secondary | ICD-10-CM | POA: Diagnosis not present

## 2017-02-17 DIAGNOSIS — I130 Hypertensive heart and chronic kidney disease with heart failure and stage 1 through stage 4 chronic kidney disease, or unspecified chronic kidney disease: Secondary | ICD-10-CM | POA: Diagnosis not present

## 2017-02-17 DIAGNOSIS — F1010 Alcohol abuse, uncomplicated: Secondary | ICD-10-CM

## 2017-02-17 DIAGNOSIS — I10 Essential (primary) hypertension: Secondary | ICD-10-CM

## 2017-02-17 DIAGNOSIS — R351 Nocturia: Secondary | ICD-10-CM

## 2017-02-17 DIAGNOSIS — Z6833 Body mass index (BMI) 33.0-33.9, adult: Secondary | ICD-10-CM

## 2017-02-17 LAB — VAS US LOWER EXTREMITY ARTERIAL DUPLEX
LATIBDISTSYS: 9 cm/s
LPOPDPSV: -89 cm/s
LPTIBDISTSYS: -75 cm/s
LSFPPSV: -87 cm/s
Left popliteal prox sys PSV: 63 cm/s
Left super femoral dist sys PSV: -250 cm/s
Left super femoral mid sys PSV: -85 cm/s
RIGHT ANT DIST TIBAL SYS PSV: 24 cm/s
RIGHT POST TIB DIST SYS: 110 cm/s
RPOPDPSV: -53 cm/s
RPOPPPSV: 54 cm/s
Right super femoral prox sys PSV: -47 cm/s

## 2017-02-17 LAB — GLUCOSE, CAPILLARY: Glucose-Capillary: 127 mg/dL — ABNORMAL HIGH (ref 65–99)

## 2017-02-17 LAB — POCT GLYCOSYLATED HEMOGLOBIN (HGB A1C): HEMOGLOBIN A1C: 5.5

## 2017-02-17 MED ORDER — GABAPENTIN 300 MG PO CAPS: 300.0000 mg | ORAL_CAPSULE | Freq: Every day | ORAL | 3 refills | Status: DC

## 2017-02-17 NOTE — Assessment & Plan Note (Signed)
Assessment  His hypertension is poorly controlled today and was measured at 180/92. His current regimen includes amlodipine 10 mg by mouth daily, Toprol-XL 100 mg by mouth daily, and terazosin 10 mg by mouth at night. He admits to not taking his medications today but states he is otherwise compliant.  Plan  Rather than adding an additional medication I've asked him to take his medications before his clinic appointment so I can assess the efficacy of this therapy. If his blood pressure is still above target at the follow-up visit we will add an additional agent. Unfortunately, I will likely have to add hydralazine given he is not a candidate for ace inhibition secondary to his stage IV chronic kidney disease.

## 2017-02-17 NOTE — Assessment & Plan Note (Signed)
Assessment  He continues to smoke and is not mentally ready to stop at this time. Therefore he remains in the pre-contemplative stage not interested in pharmacologic therapy. We spent at least 3 minutes discussing the importance of smoking cessation on his heart, legs, and lungs amongst other things.  Plan  We will reassess his readiness to quit smoking at the follow-up visit and provide pharmacologic intervention should he request it.

## 2017-02-17 NOTE — Assessment & Plan Note (Signed)
Assessment  His diet-controlled diabetes has markedly improved over the last 5 months with a hemoglobin A1c today of 5.5 down from 6.5 at the last visit. He states that during this time he is eating more vegetables and staying away from sugary drinks. His kidney function was not assessed at this visit.  Plan  He was praised on his outstanding diabetic control with his lifestyle modifications. He was encouraged to continue his slow weight loss and his dietary changes. We will assess his kidney function at the follow-up visit with a basic metabolic panel and his glycemic control at the follow-up visit with a hemoglobin A1c.

## 2017-02-17 NOTE — Assessment & Plan Note (Signed)
Assessment  He continues to qualify for stage I obesity with a BMI of 33. That being said his weight is down 3 pounds over the last 4 months. He was praised on the slow weight loss, likely related to some dietary changes he is made.  Plan  He was encouraged to continue with his dietary changes in hopes of continuing to slowly lose weight with a target within the next year of being less than 200 pounds. We will reassess the success of his dietary changes by comparing his weight at follow-up visit.

## 2017-02-17 NOTE — Patient Instructions (Signed)
It was good to see you again.  You have done an amazing job with your diabetes!  1) Keep taking the medications as you are.  I will try to get you a later morning appointment so you can take your blood pressure medications before the visit and we can see if they are working.  2) I prescribed gabapentin 300 mg at night for your diabetic nerve pain.  It may make you sleepy so take at night.  3) Try to get over the counter loratadine for allergies/cough.  This may help you cough. Also restart the nasal spray to see if that works as well.  4) I will see you in 3 months, sooner if necessary.

## 2017-02-17 NOTE — Assessment & Plan Note (Signed)
Assessment  He is stopped using his Flonase for his allergic rhinitis. This has resulted in intermittent, but chronic cough.  Plan  I encouraged him to restart his Flonase and asked him to purchase over-the-counter loratadine 10 mg by mouth daily as needed for allergies. We will reassess the control of his allergic rhinitis symptoms, and therefore his chronic cough, at the follow-up visit.

## 2017-02-17 NOTE — Assessment & Plan Note (Signed)
Assessment  Symptomatically his chronic systolic heart failure is well controlled and he denies dyspnea on exertion, paroxysmal nocturnal dyspnea, or orthopnea. That being said, claudication may be limiting his exercise tolerance. His current heart failure regimen includes Toprol-XL 100 mg by mouth daily and furosemide 80 mg by mouth daily.  Plan  We will continue the Toprol-XL at 100 mg by mouth daily and furosemide at 80 mg by mouth daily. We will reassess for symptoms at the follow-up visit on this regimen.

## 2017-02-17 NOTE — Progress Notes (Signed)
   Subjective:    Patient ID: Kevin Castillo, male    DOB: Aug 20, 1946, 71 y.o.   MRN: [id number]  HPI  Kevin Castillo is here for follow-up of his diabetes with stage IV chronic kidney disease, coronary artery disease with stable angina, peripheral vascular disease, chronic systolic heart failure, and allergic rhinitis. Please see the A&P for the status of the pt's chronic medical problems.  Review of Systems  Constitutional: Negative for unexpected weight change.  HENT: Positive for postnasal drip and rhinorrhea. Negative for sinus pressure.   Eyes: Positive for itching.  Respiratory: Positive for cough. Negative for chest tightness, shortness of breath and wheezing.   Cardiovascular: Negative for chest pain, palpitations and leg swelling.  Gastrointestinal: Negative for abdominal pain, constipation, diarrhea, nausea and vomiting.  Musculoskeletal: Negative for myalgias.  Skin: Negative for color change, rash and wound.      Objective:   Physical Exam  Constitutional: He appears well-developed and well-nourished. No distress.  HENT:  Head: Normocephalic and atraumatic.  Cardiovascular: Normal rate and regular rhythm.  Exam reveals no gallop and no friction rub.   Murmur heard. Pulmonary/Chest: Effort normal and breath sounds normal. No respiratory distress. He has no wheezes. He has no rales.  Abdominal: Soft. He exhibits no distension. There is no tenderness. There is no rebound and no guarding.  Musculoskeletal: Normal range of motion. He exhibits no edema, tenderness or deformity.  Skin: Skin is warm and dry. He is not diaphoretic. No erythema.  Psychiatric: He has a normal mood and affect. His behavior is normal. Judgment and thought content normal.  Nursing note and vitals reviewed.     Assessment & Plan:   Please see problem oriented charting.

## 2017-02-17 NOTE — Assessment & Plan Note (Signed)
Assessment  His nocturia is markedly improved with the increase in the terazosin dose to 10 mg by mouth daily. For some nights he will have no need to get up at all to urinate. For other nights, he notes that he may have to get up one or 2 times per night. Upon further history he states that he does drink a lot of water and this may be contributing to the nocturia on those nights when he has it.  Plan  We will continue the terazosin at 10 mg by mouth daily and I encouraged him to limit his fluid intake after dinner. We will reassess efficacy of this intervention on his nocturia at the follow-up visit.

## 2017-02-17 NOTE — Assessment & Plan Note (Signed)
Assessment  He is tolerating the atorvastatin 20 mg by mouth daily without myalgias.  Plan  We will continue the atorvastatin at 20 mg by mouth daily and reassess for intolerances at the follow-up visit.

## 2017-02-17 NOTE — Assessment & Plan Note (Signed)
Assessment  He denies any chest pain on his current antianginal regimen which includes Toprol-XL 100 mg by mouth daily and amlodipine 10 mg by mouth daily. He is tolerating the high intensity statin without myalgias.  Plan  Continue the Toprol-XL 100 mg by mouth daily and amlodipine 10 mg by mouth daily. We will continue the high intensity statin but continue to hold the aspirin given his stage IV chronic kidney disease. We will reassess the efficacy of this antianginal regimen at the follow-up visit.

## 2017-02-17 NOTE — Assessment & Plan Note (Signed)
Assessment  Kevin Castillo notes symptoms that are consistent with claudication in the right calf with ambulation. Recent ABIs were not obtainable in the right lower extremity secondary to noncompressible disease likely related to calcification. Doppler studies revealed decreased flow to the right lower extremity. I discussed these results with Kevin Castillo and he is willing to proceed with further evaluation by vascular surgery.  Plan  Kevin Castillo has symptomatic right lower extremity peripheral vascular disease manifesting as claudication. A referral was placed to vascular surgery to see if he is a candidate for some type of intervention whether it be percutaneous or open. We are continuing to treat his hypertension, hyperlipidemia, and diabetes. We are also encouraging him to quit smoking and are offering him pharmacologic therapy although he unfortunately is not interested at this time. He currently is not a candidate for cilostazol as he has chronic systolic failure.

## 2017-02-17 NOTE — Assessment & Plan Note (Signed)
Assessment  He states his drinking is reduced in the up until yesterday he had not had any alcohol for the last several days.  Plan  He was encouraged to continue to decrease his alcohol intake.

## 2017-03-03 DIAGNOSIS — Z961 Presence of intraocular lens: Secondary | ICD-10-CM | POA: Diagnosis not present

## 2017-03-03 DIAGNOSIS — H43812 Vitreous degeneration, left eye: Secondary | ICD-10-CM | POA: Diagnosis not present

## 2017-03-03 DIAGNOSIS — E119 Type 2 diabetes mellitus without complications: Secondary | ICD-10-CM | POA: Diagnosis not present

## 2017-03-03 LAB — HM DIABETES EYE EXAM

## 2017-03-06 ENCOUNTER — Other Ambulatory Visit: Payer: Self-pay | Admitting: Internal Medicine

## 2017-03-06 DIAGNOSIS — I10 Essential (primary) hypertension: Secondary | ICD-10-CM

## 2017-03-22 DIAGNOSIS — E784 Other hyperlipidemia: Secondary | ICD-10-CM | POA: Diagnosis not present

## 2017-03-22 DIAGNOSIS — E1140 Type 2 diabetes mellitus with diabetic neuropathy, unspecified: Secondary | ICD-10-CM | POA: Diagnosis not present

## 2017-03-22 DIAGNOSIS — D500 Iron deficiency anemia secondary to blood loss (chronic): Secondary | ICD-10-CM | POA: Diagnosis not present

## 2017-03-22 DIAGNOSIS — I10 Essential (primary) hypertension: Secondary | ICD-10-CM | POA: Diagnosis not present

## 2017-03-22 DIAGNOSIS — I5022 Chronic systolic (congestive) heart failure: Secondary | ICD-10-CM | POA: Diagnosis not present

## 2017-03-23 ENCOUNTER — Encounter: Payer: Self-pay | Admitting: Vascular Surgery

## 2017-03-31 ENCOUNTER — Encounter: Payer: Medicare HMO | Admitting: Vascular Surgery

## 2017-04-05 ENCOUNTER — Encounter: Payer: Self-pay | Admitting: *Deleted

## 2017-04-12 ENCOUNTER — Encounter: Payer: Self-pay | Admitting: Vascular Surgery

## 2017-04-21 ENCOUNTER — Encounter: Payer: Medicare HMO | Admitting: Vascular Surgery

## 2017-04-21 NOTE — Addendum Note (Signed)
Addended by: Hulan Fray on: 04/21/2017 07:31 PM   Modules accepted: Orders

## 2017-04-24 DIAGNOSIS — I10 Essential (primary) hypertension: Secondary | ICD-10-CM | POA: Diagnosis not present

## 2017-04-24 DIAGNOSIS — E1140 Type 2 diabetes mellitus with diabetic neuropathy, unspecified: Secondary | ICD-10-CM | POA: Diagnosis not present

## 2017-04-24 DIAGNOSIS — D500 Iron deficiency anemia secondary to blood loss (chronic): Secondary | ICD-10-CM | POA: Diagnosis not present

## 2017-04-24 DIAGNOSIS — E784 Other hyperlipidemia: Secondary | ICD-10-CM | POA: Diagnosis not present

## 2017-04-24 DIAGNOSIS — I5022 Chronic systolic (congestive) heart failure: Secondary | ICD-10-CM | POA: Diagnosis not present

## 2017-05-17 DIAGNOSIS — E1129 Type 2 diabetes mellitus with other diabetic kidney complication: Secondary | ICD-10-CM | POA: Diagnosis not present

## 2017-05-17 DIAGNOSIS — Z6832 Body mass index (BMI) 32.0-32.9, adult: Secondary | ICD-10-CM | POA: Diagnosis not present

## 2017-05-17 DIAGNOSIS — Z720 Tobacco use: Secondary | ICD-10-CM | POA: Diagnosis not present

## 2017-05-17 DIAGNOSIS — I739 Peripheral vascular disease, unspecified: Secondary | ICD-10-CM | POA: Diagnosis not present

## 2017-05-17 DIAGNOSIS — D631 Anemia in chronic kidney disease: Secondary | ICD-10-CM | POA: Diagnosis not present

## 2017-05-17 DIAGNOSIS — N184 Chronic kidney disease, stage 4 (severe): Secondary | ICD-10-CM | POA: Diagnosis not present

## 2017-05-17 DIAGNOSIS — N2581 Secondary hyperparathyroidism of renal origin: Secondary | ICD-10-CM | POA: Diagnosis not present

## 2017-05-17 DIAGNOSIS — E785 Hyperlipidemia, unspecified: Secondary | ICD-10-CM | POA: Diagnosis not present

## 2017-05-18 ENCOUNTER — Other Ambulatory Visit: Payer: Self-pay | Admitting: Internal Medicine

## 2017-05-18 DIAGNOSIS — I10 Essential (primary) hypertension: Secondary | ICD-10-CM

## 2017-05-18 DIAGNOSIS — E785 Hyperlipidemia, unspecified: Secondary | ICD-10-CM

## 2017-05-24 ENCOUNTER — Other Ambulatory Visit: Payer: Self-pay | Admitting: *Deleted

## 2017-05-24 DIAGNOSIS — N185 Chronic kidney disease, stage 5: Secondary | ICD-10-CM

## 2017-05-25 ENCOUNTER — Encounter: Payer: Self-pay | Admitting: Internal Medicine

## 2017-05-25 ENCOUNTER — Ambulatory Visit (INDEPENDENT_AMBULATORY_CARE_PROVIDER_SITE_OTHER): Payer: Medicare HMO | Admitting: Internal Medicine

## 2017-05-25 VITALS — BP 135/70 | HR 70 | Wt 211.7 lb

## 2017-05-25 DIAGNOSIS — N184 Chronic kidney disease, stage 4 (severe): Secondary | ICD-10-CM | POA: Diagnosis not present

## 2017-05-25 DIAGNOSIS — E1122 Type 2 diabetes mellitus with diabetic chronic kidney disease: Secondary | ICD-10-CM | POA: Diagnosis not present

## 2017-05-25 DIAGNOSIS — Z720 Tobacco use: Secondary | ICD-10-CM

## 2017-05-25 DIAGNOSIS — Z79899 Other long term (current) drug therapy: Secondary | ICD-10-CM

## 2017-05-25 DIAGNOSIS — I5022 Chronic systolic (congestive) heart failure: Secondary | ICD-10-CM | POA: Diagnosis not present

## 2017-05-25 DIAGNOSIS — F17210 Nicotine dependence, cigarettes, uncomplicated: Secondary | ICD-10-CM

## 2017-05-25 DIAGNOSIS — E7849 Other hyperlipidemia: Secondary | ICD-10-CM

## 2017-05-25 DIAGNOSIS — E785 Hyperlipidemia, unspecified: Secondary | ICD-10-CM

## 2017-05-25 DIAGNOSIS — Z0000 Encounter for general adult medical examination without abnormal findings: Secondary | ICD-10-CM

## 2017-05-25 DIAGNOSIS — N401 Enlarged prostate with lower urinary tract symptoms: Secondary | ICD-10-CM

## 2017-05-25 DIAGNOSIS — E669 Obesity, unspecified: Secondary | ICD-10-CM

## 2017-05-25 DIAGNOSIS — I361 Nonrheumatic tricuspid (valve) insufficiency: Secondary | ICD-10-CM | POA: Diagnosis not present

## 2017-05-25 DIAGNOSIS — I25719 Atherosclerosis of autologous vein coronary artery bypass graft(s) with unspecified angina pectoris: Secondary | ICD-10-CM

## 2017-05-25 DIAGNOSIS — R351 Nocturia: Secondary | ICD-10-CM

## 2017-05-25 DIAGNOSIS — Z6833 Body mass index (BMI) 33.0-33.9, adult: Secondary | ICD-10-CM | POA: Diagnosis not present

## 2017-05-25 DIAGNOSIS — Z716 Tobacco abuse counseling: Secondary | ICD-10-CM

## 2017-05-25 DIAGNOSIS — F1010 Alcohol abuse, uncomplicated: Secondary | ICD-10-CM | POA: Diagnosis not present

## 2017-05-25 DIAGNOSIS — I340 Nonrheumatic mitral (valve) insufficiency: Secondary | ICD-10-CM | POA: Diagnosis not present

## 2017-05-25 DIAGNOSIS — I130 Hypertensive heart and chronic kidney disease with heart failure and stage 1 through stage 4 chronic kidney disease, or unspecified chronic kidney disease: Secondary | ICD-10-CM | POA: Diagnosis not present

## 2017-05-25 DIAGNOSIS — I425 Other restrictive cardiomyopathy: Secondary | ICD-10-CM | POA: Diagnosis not present

## 2017-05-25 DIAGNOSIS — I10 Essential (primary) hypertension: Secondary | ICD-10-CM

## 2017-05-25 LAB — POCT GLYCOSYLATED HEMOGLOBIN (HGB A1C): Hemoglobin A1C: 5.7

## 2017-05-25 LAB — GLUCOSE, CAPILLARY: Glucose-Capillary: 135 mg/dL — ABNORMAL HIGH (ref 65–99)

## 2017-05-25 NOTE — Assessment & Plan Note (Signed)
Assessment  Although he remains in the obese range with regards to weight he is relatively stable having only increased by 1 pound in the last 3 months. Some of this may actually be water related to his heart failure.  Plan  He was praised on his ability to maintain his weight and encouraged to follow his current diet and caloric intake. We will reassess his weight at the follow-up visit.

## 2017-05-25 NOTE — Assessment & Plan Note (Signed)
Assessment  His diabetes is well controlled on diet alone with a hemoglobin A1c of 5.7 today. His chronic stage IV kidney disease was assessed with a basic metabolic panel which is pending at the time of this dictation.  Plan  He was praised on his excellent diabetic control with diet alone and was encouraged to continue to maintain this excellence. We will follow-up on the results of the basic metabolic panel when available.

## 2017-05-25 NOTE — Assessment & Plan Note (Signed)
Assessment  We spent at least 3 minutes discussing the importance of smoking cessation given his multiple chronic medical conditions. He remains in the pre-contemplative stage at this point.  Plan  He was asked to contact me should he change his mind on his desire to quit smoking. We will reassess his willingness to attempt smoking cessation at the follow-up visit, and if appropriate offer pharmacologic therapy.

## 2017-05-25 NOTE — Assessment & Plan Note (Signed)
Assessment  He denies any anginal symptoms since the last clinic visit. This is on an antianginal regimen that includes Toprol-XL 100 mg by mouth daily and amlodipine 10 mg by mouth daily.  Plan  We will continue the Toprol-XL and amlodipine at the current doses. We will also aggressively address smoking cessation at each visit and continue the moderate to high intensity statin. We will reassess for anginal symptoms at the follow-up visit.

## 2017-05-25 NOTE — Assessment & Plan Note (Signed)
Assessment  A basic metabolic panel was obtained to assess the stability of his stage IV chronic kidney disease and is pending at the time of this dictation.  Plan  We will follow-up on the results of the basic metabolic panel and aggressively manage cardiac risk factors including his hypertension, hyperlipidemia, diabetes, and tobacco abuse.

## 2017-05-25 NOTE — Assessment & Plan Note (Signed)
Assessment  He continues to have nocturia 3-4 per night. This is on an increased dose of alpha blockade, specifically terazosin 10 mg by mouth daily. He has not decreased his water intake after dinner and states he still drinks a considerable amount of water during this time.  Plan  We will continue the terazosin at 10 mg by mouth daily and I have encouraged him to limit any fluid intake after dinner to assess how well that controls his nocturia. We will quantify his nocturia at the follow-up visit if he is able to limit his water intake.

## 2017-05-25 NOTE — Assessment & Plan Note (Signed)
Assessment  He states that he continues to drink about a pint of gin on weekends and very rarely drinks during the week.  Plan  I am concerned this is an under reporting of his actual intake and encouraged him to continue to work on lowering his alcohol intake. We will reassess his alcohol intake at the follow-up visit.

## 2017-05-25 NOTE — Patient Instructions (Signed)
It was good to see you again.  1) Keep taking the medications as you are.  I think now that you are going to increase the water pill like the cardiologist wants it should help your leg swelling.  2) Keep your legs elevated when you can.  That should help the swelling.  3) I checked some blood work today.  I will call you when I get the results.  4) Let me know if you change your mind about trying to quit smoking again.  I may be able to help with medications.   5) Follow-up with the vein/artery doctors as you are planning.  6) Try to cut back on drinking all of that water after dinner.  This may help with your urination at night.  I will see you back in 3 months,. Sooner if necessary.

## 2017-05-25 NOTE — Assessment & Plan Note (Signed)
Assessment  He is tolerating the atorvastatin 20 mg by mouth daily without myalgias.  Plan  We will continue with this moderate to high intensity statin and reassess for intolerances at the follow-up visit.

## 2017-05-25 NOTE — Assessment & Plan Note (Signed)
Assessment  His blood pressure was within the target range at 135/70 on amlodipine 10 mg by mouth daily, terazosin 10 mg by mouth daily, and Toprol-XL 100 mg by mouth daily.  Plan  We will continue the amlodipine, terazosin, and Toprol-XL at the current doses and reassess his blood pressure on this regimen at the follow-up visit

## 2017-05-25 NOTE — Assessment & Plan Note (Signed)
Assessment  Symptomatically his chronic systolic heart failure is moderately well controlled on his current regimen which includes Toprol-XL 100 mg by mouth daily and Lasix. His cardiologist recently increased the Lasix dose to 80 mg by mouth every morning, but he has yet to begin this higher dose of diuretic.  Plan  He was encouraged to start the Lasix 80 mg by mouth every morning starting today. He was also asked to continue with the Toprol-XL at 100 mg by mouth daily. We will reassess his symptoms including the lower extremity edema with the increased dose of diuretics at the follow-up visit.

## 2017-05-25 NOTE — Assessment & Plan Note (Signed)
He is currently up-to-date on his health care maintenance. 

## 2017-05-25 NOTE — Progress Notes (Signed)
   Subjective:    Patient ID: Kevin Castillo, male    DOB: 03-08-1946, 71 y.o.   MRN: [id number]  HPI  Kert Shackett is here for follow-up of his coronary artery disease with angina, chronic systolic heart failure, essential hypertension, type 2 diabetes with stage 4 chronic kidney disease, alcohol abuse, obesity, and benign nocturia. Please see the A&P for the status of the pt's chronic medical problems.  Review of Systems  Constitutional: Negative for activity change, appetite change and unexpected weight change.  Respiratory: Positive for shortness of breath. Negative for chest tightness and wheezing.   Cardiovascular: Positive for leg swelling. Negative for chest pain and palpitations.  Gastrointestinal: Negative for abdominal pain, diarrhea, nausea and vomiting.      Objective:   Physical Exam  Constitutional: He is oriented to person, place, and time. He appears well-developed and well-nourished. No distress.  HENT:  Head: Normocephalic and atraumatic.  Eyes: Conjunctivae are normal. Right eye exhibits no discharge. Left eye exhibits no discharge. No scleral icterus.  Cardiovascular: Normal rate, regular rhythm and normal heart sounds.  Exam reveals no gallop and no friction rub.   No murmur heard. Pulmonary/Chest: Effort normal and breath sounds normal. No respiratory distress. He has no wheezes. He has no rales.  Abdominal: Soft. Bowel sounds are normal. He exhibits no distension. There is no tenderness. There is no rebound and no guarding.  Musculoskeletal: Normal range of motion. He exhibits edema. He exhibits no tenderness or deformity.  Neurological: He is alert and oriented to person, place, and time. He exhibits normal muscle tone.  Skin: Skin is warm and dry. No rash noted. He is not diaphoretic. No erythema.  Psychiatric: He has a normal mood and affect. His behavior is normal. Judgment and thought content normal.  Nursing note and vitals reviewed.     Assessment &  Plan:   Please see problem oriented charting.

## 2017-05-26 ENCOUNTER — Ambulatory Visit: Payer: Medicare HMO | Admitting: Internal Medicine

## 2017-05-26 LAB — BMP8+ANION GAP
ANION GAP: 13 mmol/L (ref 10.0–18.0)
BUN/Creatinine Ratio: 9 — ABNORMAL LOW (ref 10–24)
BUN: 37 mg/dL — AB (ref 8–27)
CALCIUM: 8.5 mg/dL — AB (ref 8.6–10.2)
CO2: 23 mmol/L (ref 18–29)
CREATININE: 4.04 mg/dL — AB (ref 0.76–1.27)
Chloride: 107 mmol/L — ABNORMAL HIGH (ref 96–106)
GFR calc Af Amer: 16 mL/min/{1.73_m2} — ABNORMAL LOW (ref >59)
GFR, EST NON AFRICAN AMERICAN: 14 mL/min/{1.73_m2} — AB (ref >59)
Glucose: 126 mg/dL — ABNORMAL HIGH (ref 65–99)
Potassium: 3.7 mmol/L (ref 3.5–5.2)
Sodium: 143 mmol/L (ref 134–144)

## 2017-05-26 NOTE — Progress Notes (Signed)
Patient ID: Kevin Castillo, male   DOB: 04/20/1946, 71 y.o.   MRN: [id number]  BMP: K 3.7, BUN 37, Cr 4.04, eGFR 16  I called [name] and discussed the results.  I also confirmed with him that he has a follow-up appointment with Nephrology and he told me it is for the middle of next month, which is about 4 weeks away.  Given the chronicity of his renal failure and the stability of his electrolytes I do not believe an urgent appointment is necessary.  His eGFR has drop 7cc/min, but I am concerned that a discussion about chronic dialysis will need to be further advanced as his CKD marches on.  Risk factor modification remains our continued medical regimen. 

## 2017-06-23 DIAGNOSIS — Z720 Tobacco use: Secondary | ICD-10-CM | POA: Diagnosis not present

## 2017-06-23 DIAGNOSIS — E1129 Type 2 diabetes mellitus with other diabetic kidney complication: Secondary | ICD-10-CM | POA: Diagnosis not present

## 2017-06-23 DIAGNOSIS — D631 Anemia in chronic kidney disease: Secondary | ICD-10-CM | POA: Diagnosis not present

## 2017-06-23 DIAGNOSIS — N184 Chronic kidney disease, stage 4 (severe): Secondary | ICD-10-CM | POA: Diagnosis not present

## 2017-06-23 DIAGNOSIS — I739 Peripheral vascular disease, unspecified: Secondary | ICD-10-CM | POA: Diagnosis not present

## 2017-06-23 DIAGNOSIS — E785 Hyperlipidemia, unspecified: Secondary | ICD-10-CM | POA: Diagnosis not present

## 2017-06-23 DIAGNOSIS — N2581 Secondary hyperparathyroidism of renal origin: Secondary | ICD-10-CM | POA: Diagnosis not present

## 2017-06-23 DIAGNOSIS — Z6832 Body mass index (BMI) 32.0-32.9, adult: Secondary | ICD-10-CM | POA: Diagnosis not present

## 2017-06-28 ENCOUNTER — Encounter: Payer: Self-pay | Admitting: Vascular Surgery

## 2017-07-06 ENCOUNTER — Ambulatory Visit (HOSPITAL_COMMUNITY)
Admission: RE | Admit: 2017-07-06 | Discharge: 2017-07-06 | Disposition: A | Discharge status: Home / Self Care | Payer: Medicare HMO | Source: Ambulatory Visit | Attending: Vascular Surgery | Admitting: Vascular Surgery

## 2017-07-06 ENCOUNTER — Encounter: Payer: Self-pay | Admitting: Vascular Surgery

## 2017-07-06 ENCOUNTER — Ambulatory Visit (INDEPENDENT_AMBULATORY_CARE_PROVIDER_SITE_OTHER): Payer: Medicare HMO | Admitting: Vascular Surgery

## 2017-07-06 VITALS — BP 178/88 | HR 73 | Temp 99.4°F | Resp 20 | Ht 67.0 in | Wt 210.0 lb

## 2017-07-06 DIAGNOSIS — N185 Chronic kidney disease, stage 5: Secondary | ICD-10-CM | POA: Diagnosis not present

## 2017-07-06 DIAGNOSIS — I82611 Acute embolism and thrombosis of superficial veins of right upper extremity: Secondary | ICD-10-CM | POA: Insufficient documentation

## 2017-07-06 NOTE — Progress Notes (Signed)
Referring Physician: Edrick Oh, MD  Patient name: Kevin Castillo MRN: [id number] DOB: 11/06/1946 Sex: male  REASON FOR CONSULT: Hemodialysis access  HPI: Kevin Castillo is a 71 y.o. male referred by Dr. Justin Mend for placement of long-term hemodialysis access. The patient currently is not on hemodialysis. He is renal failure is thought to be secondary to diabetes. He currently is classified as CK D5. Other medical problems include congestive heart failure, alcohol abuse, coronary artery disease and diabetes. These are all currently stable. Patient is right handed.  Past Medical History:  Diagnosis Date  . Acute left systolic heart failure ([place]) 01/06/2016  . Adenoma of left adrenal gland 06/16/2014   CT Abdomen (06/04/2014): Incidental, 20 mm, 8.33 HU suggesting benign adenoma   . Alcohol abuse 02/15/2007   Reports consuming 1 pint of gin on weekends, does not drink every day. Denies history of seizures, blackouts, or tremors.    . Allergic rhinitis 04/09/2014  . Anemia 11/12/2013   Unclear cause as of yet (? EtOH abuse)   . Benign prostatic hypertrophy 06/02/2014  . Chronic kidney disease, stage 3 11/12/2013  . Chronic systolic heart failure ([place]) 01/15/2016   Echo (01/07/2016): LVEF 35%  . Coronary artery disease 02/15/2007   Non-STEMI 07/2005, s/p CABG x 4 (08/01/2005): LIMA to LAD, SVG to OM, RCA, PCA   . DVT (deep venous thrombosis) ([place]) 2006   RLE "when I had OHS"  . Erectile dysfunction associated with type 2 diabetes mellitus ([place]) 02/15/2007  . Essential hypertension 02/15/2007  . Hyperlipidemia 02/15/2007  . Major depression, single episode 11/02/2015  . Obesity (BMI 30.0-34.9) 02/15/2007  . Peripheral vascular occlusive disease ([place]) 12/30/2013   ABI (01/03/2014): Right 0.64, Left 0.64   . Sigmoid diverticulosis 01/15/2016   Minimal per colonoscopy 01/11/2016  . Tobacco abuse 02/15/2007  . Tubular adenoma of colon 01/11/2016   Two 6 mm semi-pedunculated tubular adenomas excised  endoscopically 01/11/2016.  Repeat colonoscopy due 12/2020.  . Type 2 diabetes mellitus with mild nonproliferative diabetic retinopathy with macular edema, bilateral ([place]) 10/28/2016  . Type 2 diabetes mellitus with stage 3 chronic kidney disease ([place]) 02/15/2007  . Venous thromboembolism 11/11/2013   LE Dopplers (01/2011): RLE DVT. CTA with mild subacute to chronic right-sided pulmonary emboli.  Patient has elected to continue anticoagulation.    Past Surgical History:  Procedure Laterality Date  . CARDIAC CATHETERIZATION  07/2005  . CATARACT EXTRACTION W/ INTRAOCULAR LENS  IMPLANT, BILATERAL Bilateral 04/2014   Left 04/30/2014, Right 05/07/2014  . COLONOSCOPY WITH PROPOFOL N/A 01/11/2016   Procedure: COLONOSCOPY WITH PROPOFOL;  Surgeon: Ronald Lobo, MD;  Location: [facility];  Service: Endoscopy;  Laterality: N/A;  Needs MAC.  Marland Kitchen CORONARY ARTERY BYPASS GRAFT  08/01/2005   x 4, LIMA to LAD, SVG to OM, RCA, PCA  . ESOPHAGOGASTRODUODENOSCOPY N/A 01/08/2016   Procedure: ESOPHAGOGASTRODUODENOSCOPY (EGD);  Surgeon: Wilford Corner, MD;  Location: [facility] ENDOSCOPY;  Service: Endoscopy;  Laterality: N/A;  . GIVENS CAPSULE STUDY N/A 01/12/2016   Procedure: GIVENS CAPSULE STUDY;  Surgeon: Ronald Lobo, MD;  Location: [facility];  Service: Endoscopy;  Laterality: N/A;    Family History  Problem Relation Age of Onset  . Heart disease Father   . COPD Father   . Diabetes Mellitus II Mother   . Healthy Sister   . Healthy Brother   . Healthy Daughter   . Healthy Son   . Healthy Sister   . Healthy Sister   . Healthy Sister   . Healthy  Sister   . Healthy Daughter   . Healthy Son   . Healthy Son   . Healthy Son   . Healthy Son   . Healthy Son   . Healthy Son     SOCIAL HISTORY: Social History   Social History  . Marital status: Married    Spouse name: N/A  . Number of children: N/A  . Years of education: 10   Occupational History  . Retired Unemployed    used to drive a truck locally    Social History Main Topics  . Smoking status: Current Every Day Smoker    Packs/day: 0.20    Years: 55.00    Types: Cigarettes  . Smokeless tobacco: Former Systems developer    Types: Snuff, Chew     Comment: 1/2 pk per day  . Alcohol use 6.6 oz/week    11 Shots of liquor per week     Comment: 01/06/2016 Drinks 1 pint of gin over the weekend but does not drink everyday. Denies hx of blackouts, seizures, tremors.  . Drug use: No  . Sexual activity: Yes    Birth control/ protection: None   Other Topics Concern  . Not on file   Social History Narrative   Married, lives with his wife in [place]. 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 Only one son and one daughter live in [place]. No pets.    Allergies  Allergen Reactions  . Nicotine Transdermal System [Nicotine] Other (See Comments)    Vivid dreams    Current Outpatient Prescriptions  Medication Sig Dispense Refill  . albuterol (PROVENTIL HFA;VENTOLIN HFA) 108 (90 Base) MCG/ACT inhaler Inhale 2 puffs into the lungs every 6 (six) hours as needed for wheezing or shortness of breath. 1 Inhaler 3  . amLODipine (NORVASC) 10 MG tablet Take 1 tablet (10 mg total) by mouth daily. 90 tablet 3  . atorvastatin (LIPITOR) 20 MG tablet Take 1 tablet (20 mg total) by mouth daily. 90 tablet 3  . furosemide (LASIX) 80 MG tablet Take 1 tablet (80 mg total) by mouth daily. 30 tablet 3  . metoprolol succinate (TOPROL-XL) 100 MG 24 hr tablet TAKE 1 TABLET EVERY DAY 90 tablet 3  . pantoprazole (PROTONIX) 40 MG tablet Take 1 tablet (40 mg total) by mouth daily. 90 tablet 3  . terazosin (HYTRIN) 10 MG capsule Take 1 capsule (10 mg total) by mouth at bedtime. 90 capsule 3   No current facility-administered medications for this visit.     ROS:   General:  No weight loss, Fever, chills  HEENT: No recent headaches, no nasal bleeding, no visual changes, no sore throat  Neurologic: No dizziness, blackouts, seizures. No recent symptoms of  stroke or mini- stroke. No recent episodes of slurred speech, or temporary blindness.  Cardiac: No recent episodes of chest pain/pressure, no shortness of breath at rest.  + shortness of breath with exertion.  Denies history of atrial fibrillation or irregular heartbeat  Vascular: No history of rest pain in feet.  No history of claudication.  No history of non-healing ulcer, No history of DVT   Pulmonary: No home oxygen, no productive cough, no hemoptysis,  No asthma or wheezing  Musculoskeletal:  [X]  Arthritis, [ ]  Low back pain,  [ ]  Joint pain  Hematologic:No history of hypercoagulable state.  No history of easy bleeding.  + history of anemia  Gastrointestinal: No hematochezia or melena,  No gastroesophageal reflux, no trouble swallowing  Urinary: [X]  chronic Kidney  disease, [ ]  on HD - [ ]  MWF or [ ]  TTHS, [ ]  Burning with urination, [ ]  Frequent urination, [ ]  Difficulty urinating;   Skin: No rashes  Psychological: No history of anxiety,  No history of depression   Physical Examination  Vitals:   07/06/17 1148 07/06/17 1152  BP: (!) 180/89 (!) 178/88  Pulse: 73   Resp: 20   Temp: 99.4 F (37.4 C)   TempSrc: Oral   SpO2: 100%   Weight: 210 lb (95.3 kg)   Height: 5' 7"  (1.702 m)     Body mass index is 32.89 kg/m.  General:  Alert and oriented, no acute distress HEENT: Normal Neck: No bruit or JVD Pulmonary: Clear to auscultation bilaterally Cardiac: Regular Rate and Rhythm without murmur Abdomen: Soft, non-tender, non-distended, no mass Skin: No rash Extremity Pulses:  1+ radial, 2+ brachial pulses bilaterally Musculoskeletal: No deformity or edema  Neurologic: Upper and lower extremity motor 5/5 and symmetric  DATA:  Patient had upper extremity arterial and venous duplex exam today. He had a high brachial artery bifurcation on the left side. Cephalic vein was too small for creation of a fistula bilaterally. Basilic vein was also marginal  bilaterally.  ASSESSMENT:  Patient needs long-term hemodialysis access. However, he is probably not going to be a candidate for a fistula due to small veins. Also he will most likely need his access placed in his right arm since he has a high brachial bifurcation on the left side.   PLAN:  I spoke to Dr. Justin Mend by phone today. He will call me when he wishes to have a AV graft placed. Otherwise we will defer the patient's access placement for now until hemodialysis is more imminent. Risks benefits possible complications and procedure details of placing an AV graft were discussed with the patient and his family today including but not limited to bleeding infection graft thrombosis ischemic steal. He understands and agrees to proceed with Dr. Justin Mend wishes for [place] to proceed.   Ruta Hinds, MD [facility] Office: [phone] Pager: [phone]

## 2017-07-19 NOTE — Discharge Instructions (Signed)
Epoetin Alfa injection °What is this medicine? °EPOETIN ALFA (e POE e tin AL fa) helps your body make more red blood cells. This medicine is used to treat anemia caused by chronic kidney failure, cancer chemotherapy, or HIV-therapy. It may also be used before surgery if you have anemia. °This medicine may be used for other purposes; ask your health care provider or pharmacist if you have questions. °COMMON BRAND NAME(S): Epogen, Procrit °What should I tell my health care provider before I take this medicine? °They need to know if you have any of these conditions: °-blood clotting disorders °-cancer patient not on chemotherapy °-cystic fibrosis °-heart disease, such as angina or heart failure °-hemoglobin level of 12 g/dL or greater °-high blood pressure °-low levels of folate, iron, or vitamin B12 °-seizures °-an unusual or allergic reaction to erythropoietin, albumin, benzyl alcohol, hamster proteins, other medicines, foods, dyes, or preservatives °-pregnant or trying to get pregnant °-breast-feeding °How should I use this medicine? °This medicine is for injection into a vein or under the skin. It is usually given by a health care professional in a hospital or clinic setting. °If you get this medicine at home, you will be taught how to prepare and give this medicine. Use exactly as directed. Take your medicine at regular intervals. Do not take your medicine more often than directed. °It is important that you put your used needles and syringes in a special sharps container. Do not put them in a trash can. If you do not have a sharps container, call your pharmacist or healthcare provider to get one. °A special MedGuide will be given to you by the pharmacist with each prescription and refill. Be sure to read this information carefully each time. °Talk to your pediatrician regarding the use of this medicine in children. While this drug may be prescribed for selected conditions, precautions do apply. °Overdosage: If you  think you have taken too much of this medicine contact a poison control center or emergency room at once. °NOTE: This medicine is only for you. Do not share this medicine with others. °What if I miss a dose? °If you miss a dose, take it as soon as you can. If it is almost time for your next dose, take only that dose. Do not take double or extra doses. °What may interact with this medicine? °Do not take this medicine with any of the following medications: °-darbepoetin alfa °This list may not describe all possible interactions. Give your health care provider a list of all the medicines, herbs, non-prescription drugs, or dietary supplements you use. Also tell them if you smoke, drink alcohol, or use illegal drugs. Some items may interact with your medicine. °What should I watch for while using this medicine? °Your condition will be monitored carefully while you are receiving this medicine. °You may need blood work done while you are taking this medicine. °What side effects may I notice from receiving this medicine? °Side effects that you should report to your doctor or health care professional as soon as possible: °-allergic reactions like skin rash, itching or hives, swelling of the face, lips, or tongue °-breathing problems °-changes in vision °-chest pain °-confusion, trouble speaking or understanding °-feeling faint or lightheaded, falls °-high blood pressure °-muscle aches or pains °-pain, swelling, warmth in the leg °-rapid weight gain °-severe headaches °-sudden numbness or weakness of the face, arm or leg °-trouble walking, dizziness, loss of balance or coordination °-seizures (convulsions) °-swelling of the ankles, feet, hands °-unusually weak or tired °  Side effects that usually do not require medical attention (report to your doctor or health care professional if they continue or are bothersome): °-diarrhea °-fever, chills (flu-like symptoms) °-headaches °-nausea, vomiting °-redness, stinging, or swelling at  site where injected °This list may not describe all possible side effects. Call your doctor for medical advice about side effects. You may report side effects to FDA at 1-800-FDA-1088. °Where should I keep my medicine? °Keep out of the reach of children. °Store in a refrigerator between 2 and 8 degrees C (36 and 46 degrees F). Do not freeze or shake. Throw away any unused portion if using a single-dose vial. Multi-dose vials can be kept in the refrigerator for up to 21 days after the initial dose. Throw away unused medicine. °NOTE: This sheet is a summary. It may not cover all possible information. If you have questions about this medicine, talk to your doctor, pharmacist, or health care provider. °© 2018 Elsevier/Gold Standard (2016-07-25 19:42:31) ° °

## 2017-07-20 ENCOUNTER — Encounter (HOSPITAL_COMMUNITY)
Admission: RE | Admit: 2017-07-20 | Discharge: 2017-07-20 | Disposition: A | Discharge status: Home / Self Care | Payer: Medicare HMO | Source: Ambulatory Visit | Attending: Nephrology | Admitting: Nephrology

## 2017-07-20 DIAGNOSIS — D631 Anemia in chronic kidney disease: Secondary | ICD-10-CM | POA: Insufficient documentation

## 2017-07-20 DIAGNOSIS — N184 Chronic kidney disease, stage 4 (severe): Secondary | ICD-10-CM | POA: Diagnosis not present

## 2017-07-20 LAB — POCT HEMOGLOBIN-HEMACUE: HEMOGLOBIN: 9.7 g/dL — AB (ref 13.0–17.0)

## 2017-07-20 MED ORDER — EPOETIN ALFA 10000 UNIT/ML IJ SOLN
5000.0000 [IU] | INTRAMUSCULAR | Status: DC
  Administered 2017-07-20: 5000 [IU] via SUBCUTANEOUS

## 2017-07-20 MED ORDER — EPOETIN ALFA 10000 UNIT/ML IJ SOLN
INTRAMUSCULAR | Status: AC
  Administered 2017-07-20: 5000 [IU] via SUBCUTANEOUS
  Filled 2017-07-20: qty 1

## 2017-07-27 ENCOUNTER — Encounter (HOSPITAL_COMMUNITY)
Admission: RE | Admit: 2017-07-27 | Discharge: 2017-07-27 | Disposition: A | Discharge status: Home / Self Care | Payer: Medicare HMO | Source: Ambulatory Visit | Attending: Nephrology | Admitting: Nephrology

## 2017-07-27 DIAGNOSIS — D631 Anemia in chronic kidney disease: Secondary | ICD-10-CM | POA: Diagnosis not present

## 2017-07-27 DIAGNOSIS — N184 Chronic kidney disease, stage 4 (severe): Secondary | ICD-10-CM | POA: Diagnosis not present

## 2017-07-27 LAB — POCT HEMOGLOBIN-HEMACUE: HEMOGLOBIN: 10.7 g/dL — AB (ref 13.0–17.0)

## 2017-07-27 MED ORDER — EPOETIN ALFA 10000 UNIT/ML IJ SOLN
5000.0000 [IU] | INTRAMUSCULAR | Status: DC
  Administered 2017-07-27: 5000 [IU] via SUBCUTANEOUS

## 2017-07-27 MED ORDER — EPOETIN ALFA 10000 UNIT/ML IJ SOLN
INTRAMUSCULAR | Status: AC
  Filled 2017-07-27: qty 1

## 2017-07-28 ENCOUNTER — Encounter: Payer: Self-pay | Admitting: Internal Medicine

## 2017-07-28 DIAGNOSIS — D631 Anemia in chronic kidney disease: Secondary | ICD-10-CM

## 2017-07-28 DIAGNOSIS — N185 Chronic kidney disease, stage 5: Secondary | ICD-10-CM

## 2017-07-28 DIAGNOSIS — N2581 Secondary hyperparathyroidism of renal origin: Secondary | ICD-10-CM

## 2017-07-28 DIAGNOSIS — N184 Chronic kidney disease, stage 4 (severe): Secondary | ICD-10-CM

## 2017-07-28 HISTORY — DX: Anemia in chronic kidney disease: D63.1

## 2017-07-28 HISTORY — DX: Secondary hyperparathyroidism of renal origin: N25.81

## 2017-07-28 HISTORY — DX: Chronic kidney disease, stage 4 (severe): N18.4

## 2017-08-03 ENCOUNTER — Encounter (HOSPITAL_COMMUNITY)
Admission: RE | Admit: 2017-08-03 | Discharge: 2017-08-03 | Disposition: A | Discharge status: Home / Self Care | Payer: Medicare HMO | Source: Ambulatory Visit | Attending: Nephrology | Admitting: Nephrology

## 2017-08-03 DIAGNOSIS — N184 Chronic kidney disease, stage 4 (severe): Secondary | ICD-10-CM | POA: Diagnosis not present

## 2017-08-03 DIAGNOSIS — D631 Anemia in chronic kidney disease: Secondary | ICD-10-CM | POA: Diagnosis not present

## 2017-08-03 LAB — POCT HEMOGLOBIN-HEMACUE: Hemoglobin: 10 g/dL — ABNORMAL LOW (ref 13.0–17.0)

## 2017-08-03 MED ORDER — EPOETIN ALFA 10000 UNIT/ML IJ SOLN
INTRAMUSCULAR | Status: AC
  Filled 2017-08-03: qty 1

## 2017-08-03 MED ORDER — EPOETIN ALFA 10000 UNIT/ML IJ SOLN
5000.0000 [IU] | INTRAMUSCULAR | Status: DC
  Administered 2017-08-03: 5000 [IU] via SUBCUTANEOUS

## 2017-08-10 ENCOUNTER — Encounter (HOSPITAL_COMMUNITY)
Admission: RE | Admit: 2017-08-10 | Discharge: 2017-08-10 | Disposition: A | Discharge status: Home / Self Care | Payer: Medicare HMO | Source: Ambulatory Visit | Attending: Nephrology | Admitting: Nephrology

## 2017-08-10 DIAGNOSIS — N184 Chronic kidney disease, stage 4 (severe): Secondary | ICD-10-CM | POA: Diagnosis not present

## 2017-08-10 DIAGNOSIS — D631 Anemia in chronic kidney disease: Secondary | ICD-10-CM | POA: Diagnosis not present

## 2017-08-10 LAB — POCT HEMOGLOBIN-HEMACUE: Hemoglobin: 10.1 g/dL — ABNORMAL LOW (ref 13.0–17.0)

## 2017-08-10 MED ORDER — EPOETIN ALFA 10000 UNIT/ML IJ SOLN
5000.0000 [IU] | INTRAMUSCULAR | Status: DC
  Administered 2017-08-10: 5000 [IU] via SUBCUTANEOUS

## 2017-08-10 MED ORDER — EPOETIN ALFA 10000 UNIT/ML IJ SOLN
INTRAMUSCULAR | Status: AC
  Administered 2017-08-10: 14:00:00 5000 [IU] via SUBCUTANEOUS
  Filled 2017-08-10: qty 1

## 2017-08-17 ENCOUNTER — Encounter (HOSPITAL_COMMUNITY)
Admission: RE | Admit: 2017-08-17 | Discharge: 2017-08-17 | Disposition: A | Discharge status: Home / Self Care | Payer: Medicare HMO | Source: Ambulatory Visit | Attending: Nephrology | Admitting: Nephrology

## 2017-08-17 DIAGNOSIS — N184 Chronic kidney disease, stage 4 (severe): Secondary | ICD-10-CM | POA: Diagnosis not present

## 2017-08-17 DIAGNOSIS — D631 Anemia in chronic kidney disease: Secondary | ICD-10-CM | POA: Diagnosis not present

## 2017-08-17 LAB — IRON AND TIBC
Iron: 49 ug/dL (ref 45–182)
Saturation Ratios: 13 % — ABNORMAL LOW (ref 17.9–39.5)
TIBC: 384 ug/dL (ref 250–450)
UIBC: 335 ug/dL

## 2017-08-17 LAB — POCT HEMOGLOBIN-HEMACUE: Hemoglobin: 10.3 g/dL — ABNORMAL LOW (ref 13.0–17.0)

## 2017-08-17 LAB — FERRITIN: Ferritin: 20 ng/mL — ABNORMAL LOW (ref 24–336)

## 2017-08-17 MED ORDER — EPOETIN ALFA 10000 UNIT/ML IJ SOLN
5000.0000 [IU] | INTRAMUSCULAR | Status: DC
Start: 2017-08-17 — End: 2017-08-18
  Administered 2017-08-17: 5000 [IU] via SUBCUTANEOUS

## 2017-08-17 MED ORDER — EPOETIN ALFA 10000 UNIT/ML IJ SOLN
INTRAMUSCULAR | Status: AC
Start: 2017-08-17 — End: 2017-08-17
  Administered 2017-08-17: 5000 [IU] via SUBCUTANEOUS
  Filled 2017-08-17: qty 1

## 2017-08-23 ENCOUNTER — Encounter (HOSPITAL_COMMUNITY)
Admission: RE | Admit: 2017-08-23 | Discharge: 2017-08-23 | Disposition: A | Discharge status: Home / Self Care | Payer: Medicare HMO | Source: Ambulatory Visit | Attending: Nephrology | Admitting: Nephrology

## 2017-08-23 DIAGNOSIS — N184 Chronic kidney disease, stage 4 (severe): Secondary | ICD-10-CM | POA: Diagnosis not present

## 2017-08-23 DIAGNOSIS — D631 Anemia in chronic kidney disease: Secondary | ICD-10-CM | POA: Diagnosis not present

## 2017-08-23 LAB — POCT HEMOGLOBIN-HEMACUE: Hemoglobin: 10 g/dL — ABNORMAL LOW (ref 13.0–17.0)

## 2017-08-23 MED ORDER — EPOETIN ALFA 10000 UNIT/ML IJ SOLN
5000.0000 [IU] | INTRAMUSCULAR | Status: DC
  Administered 2017-08-23: 10:00:00 5000 [IU] via SUBCUTANEOUS

## 2017-08-23 MED ORDER — EPOETIN ALFA 10000 UNIT/ML IJ SOLN
INTRAMUSCULAR | Status: AC
  Filled 2017-08-23: qty 1

## 2017-08-31 ENCOUNTER — Encounter (HOSPITAL_COMMUNITY): Payer: Medicare HMO

## 2017-09-14 ENCOUNTER — Encounter (HOSPITAL_COMMUNITY)
Admission: RE | Admit: 2017-09-14 | Discharge: 2017-09-14 | Disposition: A | Discharge status: Home / Self Care | Payer: Medicare HMO | Source: Ambulatory Visit | Attending: Nephrology | Admitting: Nephrology

## 2017-09-14 DIAGNOSIS — D631 Anemia in chronic kidney disease: Secondary | ICD-10-CM | POA: Diagnosis not present

## 2017-09-14 DIAGNOSIS — N184 Chronic kidney disease, stage 4 (severe): Secondary | ICD-10-CM | POA: Diagnosis not present

## 2017-09-14 LAB — FERRITIN: Ferritin: 26 ng/mL (ref 24–336)

## 2017-09-14 LAB — IRON AND TIBC
Iron: 42 ug/dL — ABNORMAL LOW (ref 45–182)
SATURATION RATIOS: 11 % — AB (ref 17.9–39.5)
TIBC: 382 ug/dL (ref 250–450)
UIBC: 340 ug/dL

## 2017-09-14 LAB — POCT HEMOGLOBIN-HEMACUE: HEMOGLOBIN: 10 g/dL — AB (ref 13.0–17.0)

## 2017-09-14 MED ORDER — EPOETIN ALFA 10000 UNIT/ML IJ SOLN
INTRAMUSCULAR | Status: AC
  Filled 2017-09-14: qty 1

## 2017-09-14 MED ORDER — EPOETIN ALFA 10000 UNIT/ML IJ SOLN
5000.0000 [IU] | INTRAMUSCULAR | Status: DC
  Administered 2017-09-14: 5000 [IU] via SUBCUTANEOUS

## 2017-09-21 ENCOUNTER — Encounter (HOSPITAL_COMMUNITY): Payer: Medicare HMO

## 2017-09-22 ENCOUNTER — Other Ambulatory Visit (HOSPITAL_COMMUNITY): Payer: Self-pay | Admitting: *Deleted

## 2017-09-22 DIAGNOSIS — N184 Chronic kidney disease, stage 4 (severe): Secondary | ICD-10-CM | POA: Diagnosis not present

## 2017-09-22 DIAGNOSIS — N2581 Secondary hyperparathyroidism of renal origin: Secondary | ICD-10-CM | POA: Diagnosis not present

## 2017-09-22 DIAGNOSIS — Z720 Tobacco use: Secondary | ICD-10-CM | POA: Diagnosis not present

## 2017-09-22 DIAGNOSIS — E785 Hyperlipidemia, unspecified: Secondary | ICD-10-CM | POA: Diagnosis not present

## 2017-09-22 DIAGNOSIS — E1129 Type 2 diabetes mellitus with other diabetic kidney complication: Secondary | ICD-10-CM | POA: Diagnosis not present

## 2017-09-22 DIAGNOSIS — Z6832 Body mass index (BMI) 32.0-32.9, adult: Secondary | ICD-10-CM | POA: Diagnosis not present

## 2017-09-22 DIAGNOSIS — D631 Anemia in chronic kidney disease: Secondary | ICD-10-CM | POA: Diagnosis not present

## 2017-09-22 DIAGNOSIS — I739 Peripheral vascular disease, unspecified: Secondary | ICD-10-CM | POA: Diagnosis not present

## 2017-09-25 ENCOUNTER — Encounter (HOSPITAL_COMMUNITY): Payer: Medicare HMO

## 2017-10-27 ENCOUNTER — Other Ambulatory Visit: Payer: Self-pay

## 2017-10-27 ENCOUNTER — Encounter: Payer: Self-pay | Admitting: Internal Medicine

## 2017-10-27 ENCOUNTER — Ambulatory Visit (INDEPENDENT_AMBULATORY_CARE_PROVIDER_SITE_OTHER): Payer: Medicare HMO | Admitting: Internal Medicine

## 2017-10-27 VITALS — BP 120/58 | HR 66 | Temp 97.4°F | Wt 205.2 lb

## 2017-10-27 DIAGNOSIS — Z23 Encounter for immunization: Secondary | ICD-10-CM | POA: Diagnosis not present

## 2017-10-27 DIAGNOSIS — E1122 Type 2 diabetes mellitus with diabetic chronic kidney disease: Secondary | ICD-10-CM | POA: Diagnosis not present

## 2017-10-27 DIAGNOSIS — F1010 Alcohol abuse, uncomplicated: Secondary | ICD-10-CM

## 2017-10-27 DIAGNOSIS — I130 Hypertensive heart and chronic kidney disease with heart failure and stage 1 through stage 4 chronic kidney disease, or unspecified chronic kidney disease: Secondary | ICD-10-CM | POA: Diagnosis not present

## 2017-10-27 DIAGNOSIS — I10 Essential (primary) hypertension: Secondary | ICD-10-CM

## 2017-10-27 DIAGNOSIS — I5022 Chronic systolic (congestive) heart failure: Secondary | ICD-10-CM

## 2017-10-27 DIAGNOSIS — E669 Obesity, unspecified: Secondary | ICD-10-CM

## 2017-10-27 DIAGNOSIS — N184 Chronic kidney disease, stage 4 (severe): Secondary | ICD-10-CM | POA: Diagnosis not present

## 2017-10-27 DIAGNOSIS — I25719 Atherosclerosis of autologous vein coronary artery bypass graft(s) with unspecified angina pectoris: Secondary | ICD-10-CM

## 2017-10-27 DIAGNOSIS — I739 Peripheral vascular disease, unspecified: Secondary | ICD-10-CM

## 2017-10-27 DIAGNOSIS — N401 Enlarged prostate with lower urinary tract symptoms: Secondary | ICD-10-CM

## 2017-10-27 DIAGNOSIS — F17210 Nicotine dependence, cigarettes, uncomplicated: Secondary | ICD-10-CM

## 2017-10-27 DIAGNOSIS — E7800 Pure hypercholesterolemia, unspecified: Secondary | ICD-10-CM | POA: Diagnosis not present

## 2017-10-27 DIAGNOSIS — D631 Anemia in chronic kidney disease: Secondary | ICD-10-CM

## 2017-10-27 DIAGNOSIS — R351 Nocturia: Secondary | ICD-10-CM

## 2017-10-27 DIAGNOSIS — Z720 Tobacco use: Secondary | ICD-10-CM

## 2017-10-27 LAB — POCT GLYCOSYLATED HEMOGLOBIN (HGB A1C): Hemoglobin A1C: 6.1

## 2017-10-27 LAB — GLUCOSE, CAPILLARY: Glucose-Capillary: 115 mg/dL — ABNORMAL HIGH (ref 65–99)

## 2017-10-27 MED ORDER — FINASTERIDE 5 MG PO TABS: 5.0000 mg | ORAL_TABLET | Freq: Every day | ORAL | 3 refills | Status: DC

## 2017-10-27 NOTE — Assessment & Plan Note (Signed)
Assessment  He has been followed closely by Dr. Justin Mend of nephrology. He is not felt to require initiation of hemodialysis at this time although he is progressing. He has required erythropoietin therapy for anemia associated with chronic kidney disease.  Plan  He will continue to follow closely with Dr. Justin Mend of nephrology for assessment of the need to initiate hemodialysis moving forward.

## 2017-10-27 NOTE — Assessment & Plan Note (Signed)
Assessment  His blood pressure today was 120/58. This is well within target. His current regimen includes amlodipine 10 mg by mouth daily, terazosin 10 mg by mouth daily, Toprol-XL 100 mg by mouth daily, and Lasix 80 mg by mouth daily given his chronic renal failure.  Plan  We will continue with the amlodipine 10 mg by mouth daily, terazosin 10 mg by mouth daily, Toprol-XL 100 mg by mouth daily, and Lasix 80 mg by mouth daily. We will reassess his blood pressure control at the follow-up visit.

## 2017-10-27 NOTE — Assessment & Plan Note (Signed)
Assessment  He has not had any claudication of his lower extremities with exertion. There also has been no evidence of skin changes suggesting clinically significant decrements in blood flow.  Plan  We will continue risk factor modification by controlling his diabetes, hyperlipidemia, and hypertension. We will reassess his antiplatelet regimen at the follow-up visit.

## 2017-10-27 NOTE — Assessment & Plan Note (Signed)
Assessment  He continues to smoke and states he is not mentally ready to give cessation another go.  Plan  We spent at least 4 minutes discussing the importance of tobacco cessation given his cardiovascular disease. He is well aware of the risks and knows that he should stop. He will let me know if he gets to the point of wanting to make another run at cessation. We will reassess his tobacco use at the follow-up visit.

## 2017-10-27 NOTE — Patient Instructions (Signed)
It was good to see you again.  1) Keep taking the medications as you are.  2) We are checking your urine today.  If there is anything we need to do we will give you a call.  3) We gave you the flu shot today.  4) I sent a referral to the [facility] foot doctors to trim your nails and assess why you are getting the right foot pain.  5) I started finasteride 5 mg by mouth daily.  This should help shrink your prostate so you are not getting up as frequently at night.  6) Call me if you want some medications to help with smoking.  I will see you back in 3 months, sooner if necessary.

## 2017-10-27 NOTE — Assessment & Plan Note (Signed)
Assessment  His chronic systolic heart failure is clinically well controlled on the Toprol-XL 100 mg by mouth daily and Lasix 80 mg by mouth daily. He denies orthopnea, paroxysmal nocturnal dyspnea, or lower extremity edema.  Plan  We will continue with the Toprol-XL 100 mg by mouth daily and Lasix 80 mg by mouth daily and reassess for evidence of decompensation of his chronic systolic heart failure at the follow-up visit.

## 2017-10-27 NOTE — Assessment & Plan Note (Signed)
Assessment  He lost another 7 pounds since the last visit. He has worked very hard with portion control and his cut out all beer.  Plan  We discussed the importance of continued weight loss and he has set a goal of 200 pounds to be achieved over the next 3 months. This is less than 2 pounds a month. He believes that with his continued work on portion size control this goal should be met.

## 2017-10-27 NOTE — Assessment & Plan Note (Signed)
Assessment  Despite the terazosin 10 mg by mouth daily he continues to have significant nocturia. I offered a rectal examination to assess for asymmetry of his prostate but he deferred at this time. He was interested in the initiation of finasteride to the terazosin.  Plan  We will continue the terazosin 10 mg by mouth daily and start finasteride at 5 mg by mouth daily. He was reminded the finasteride will take several months in order to hit peak effect and he should not give up on this therapy if he is not symptomatically improved within the next few weeks. We will reassess the efficacy of the finasteride in decreasing his nocturia and bladder outlet obstruction symptoms at the follow-up visit.

## 2017-10-27 NOTE — Assessment & Plan Note (Signed)
Assessment  Although he has cut out beer altogether he did admit to drinking a pint of gin on weekends. This is a slight retreat back to his old habits.  Plan  The importance of maintaining reasonable our call intake was again discussed. We will reassess his alcohol intake at the follow-up visit.

## 2017-10-27 NOTE — Assessment & Plan Note (Signed)
Assessment  His hemoglobin A1c today was 6.1. This is likely an overestimation as to the degree of glycemic control he has since his anemia results in a faster than normal turnover of red blood cells. That said, his fasting blood sugars in clinic over the last year have been between 115 and 135. In addition, he has worked very hard on weight loss which undoubtedly assists in management of his hyperglycemia. He does not check his blood sugars at home. Currently, his diabetes is managed with diet alone. That said, he does have stage IV chronic kidney disease associated with the diabetes and is being followed closely by Dr. Justin Mend of nephrology. He does not have signs or symptoms that would suggest he requires dialysis initiation at this time although he has already been evaluated by vascular surgery in anticipation of the need for vascular access placement.  Plan  We will continue with the dietary management of his diabetes and he will continue to work on continued weight loss. I do not believe that he needs to check his sugars at home given the efforts he's made in weight loss and the fact that all of his fasting sugars in our clinic have been excellent over the past year. He will continue to follow closely with Dr. Justin Mend in nephrology so a decision can be made as to when is the appropriate time to initiate hemodialysis. We will reassess his diabetes at the follow-up clinic appointment with a repeat fasting blood sugar and hemoglobin A1c. We have completed the foot exam today and a urine for microalbumin is pending at the time of this dictation. He is otherwise up-to-date on his diabetic health care maintenance.

## 2017-10-27 NOTE — Assessment & Plan Note (Signed)
Assessment  He continues to require erythropoietin therapy for his anemia of chronic kidney disease.  Plan  He will continue to receive erythropoietin as required via the nephrology service.

## 2017-10-27 NOTE — Assessment & Plan Note (Signed)
Assessment  He denies any chest pain on his current antianginal regimen. This includes amlodipine 10 mg by mouth daily and Toprol-XL 100 mg by mouth daily. I am unsure if he is taking an aspirin a day.  Plan  We will continue the amlodipine 10 mg daily and Toprol-XL 100 mg by mouth daily. At the follow-up visit I will document his aspirin use. We will also reassess for evidence of angina at that visit.

## 2017-10-27 NOTE — Assessment & Plan Note (Signed)
He received the flu vaccination today. He is otherwise up-to-date and his health care maintenance.

## 2017-10-27 NOTE — Assessment & Plan Note (Signed)
Assessment  He is tolerating the atorvastatin 20 mg by mouth daily without myalgias.  Plan  We will continue with this moderate intensity statin and reassess for intolerances at the follow-up visit.

## 2017-10-27 NOTE — Progress Notes (Signed)
   Subjective:    Patient ID: Kevin Castillo, male    DOB: [birth year], 71 y.o.   MRN: [id number]  HPI  Kevin Castillo is here for follow-up of his type 2 diabetes complicated by stage 4 chronic kidney disease, essential hypertension, hyperlipidemia, obesity, tobacco and alcohol abuse, coronary artery disease, peripheral vascular disease, chronic systolic heart failure,  and benign prostatic hypertrophy. Please see the A&P for the status of the pt's chronic medical problems.  Review of Systems  Constitutional: Negative for activity change, appetite change, fatigue and unexpected weight change.  Respiratory: Negative for chest tightness, shortness of breath and wheezing.   Cardiovascular: Negative for chest pain, palpitations and leg swelling.  Gastrointestinal: Negative for abdominal distention, abdominal pain, constipation, diarrhea, nausea and vomiting.  Genitourinary: Positive for frequency.  Musculoskeletal: Negative for arthralgias, back pain, gait problem, joint swelling and myalgias.  Skin: Negative for rash and wound.  Neurological: Negative for light-headedness.      Objective:   Physical Exam  Constitutional: He is oriented to person, place, and time. He appears well-developed and well-nourished. No distress.  HENT:  Head: Normocephalic and atraumatic.  Eyes: Conjunctivae are normal. Right eye exhibits no discharge. Left eye exhibits no discharge. No scleral icterus.  Cardiovascular: Normal rate, regular rhythm and normal heart sounds. Exam reveals no gallop and no friction rub.  No murmur heard. Pulmonary/Chest: Effort normal and breath sounds normal. No respiratory distress. He has no wheezes. He has no rales.  Abdominal: Soft. Bowel sounds are normal. He exhibits no distension. There is no tenderness. There is no rebound and no guarding.  Musculoskeletal: Normal range of motion. He exhibits no edema, tenderness or deformity.  Neurological: He is alert and oriented to person,  place, and time. He exhibits normal muscle tone.  Skin: Skin is warm and dry. No rash noted. He is not diaphoretic. No erythema.  Psychiatric: He has a normal mood and affect. His behavior is normal. Judgment and thought content normal.  Nursing note and vitals reviewed.     Assessment & Plan:   Please see problem oriented charting.

## 2017-10-28 LAB — MICROALBUMIN / CREATININE URINE RATIO
Creatinine, Urine: 36.9 mg/dL
Microalb/Creat Ratio: 1672.1 mg/g creat — ABNORMAL HIGH (ref 0.0–30.0)
Microalbumin, Urine: 617 ug/mL

## 2017-11-08 ENCOUNTER — Encounter: Payer: Self-pay | Admitting: Nephrology

## 2017-11-08 DIAGNOSIS — L600 Ingrowing nail: Secondary | ICD-10-CM | POA: Diagnosis not present

## 2017-11-08 DIAGNOSIS — M21611 Bunion of right foot: Secondary | ICD-10-CM | POA: Diagnosis not present

## 2017-11-08 DIAGNOSIS — M71571 Other bursitis, not elsewhere classified, right ankle and foot: Secondary | ICD-10-CM | POA: Diagnosis not present

## 2017-11-13 ENCOUNTER — Inpatient Hospital Stay (HOSPITAL_COMMUNITY)
Admission: RE | Admit: 2017-11-13 | Discharge: 2017-11-13 | Disposition: A | Discharge status: Home / Self Care | Payer: Medicare HMO | Source: Ambulatory Visit | Attending: Nephrology | Admitting: Nephrology

## 2017-11-13 NOTE — Progress Notes (Signed)
Pt was a no show for first procrit injection

## 2017-12-28 DIAGNOSIS — E1142 Type 2 diabetes mellitus with diabetic polyneuropathy: Secondary | ICD-10-CM | POA: Diagnosis not present

## 2018-01-19 DIAGNOSIS — N184 Chronic kidney disease, stage 4 (severe): Secondary | ICD-10-CM | POA: Diagnosis not present

## 2018-02-05 ENCOUNTER — Other Ambulatory Visit: Payer: Self-pay | Admitting: Internal Medicine

## 2018-02-05 DIAGNOSIS — R351 Nocturia: Principal | ICD-10-CM

## 2018-02-05 DIAGNOSIS — N401 Enlarged prostate with lower urinary tract symptoms: Secondary | ICD-10-CM

## 2018-02-07 DIAGNOSIS — N184 Chronic kidney disease, stage 4 (severe): Secondary | ICD-10-CM | POA: Diagnosis not present

## 2018-02-07 DIAGNOSIS — Z720 Tobacco use: Secondary | ICD-10-CM | POA: Diagnosis not present

## 2018-02-07 DIAGNOSIS — Z6832 Body mass index (BMI) 32.0-32.9, adult: Secondary | ICD-10-CM | POA: Diagnosis not present

## 2018-02-07 DIAGNOSIS — I129 Hypertensive chronic kidney disease with stage 1 through stage 4 chronic kidney disease, or unspecified chronic kidney disease: Secondary | ICD-10-CM | POA: Diagnosis not present

## 2018-02-07 DIAGNOSIS — I739 Peripheral vascular disease, unspecified: Secondary | ICD-10-CM | POA: Diagnosis not present

## 2018-02-07 DIAGNOSIS — E785 Hyperlipidemia, unspecified: Secondary | ICD-10-CM | POA: Diagnosis not present

## 2018-02-07 DIAGNOSIS — E1122 Type 2 diabetes mellitus with diabetic chronic kidney disease: Secondary | ICD-10-CM | POA: Diagnosis not present

## 2018-02-07 DIAGNOSIS — N2581 Secondary hyperparathyroidism of renal origin: Secondary | ICD-10-CM | POA: Diagnosis not present

## 2018-02-07 DIAGNOSIS — D631 Anemia in chronic kidney disease: Secondary | ICD-10-CM | POA: Diagnosis not present

## 2018-02-08 DIAGNOSIS — D631 Anemia in chronic kidney disease: Secondary | ICD-10-CM | POA: Diagnosis not present

## 2018-02-16 ENCOUNTER — Other Ambulatory Visit (HOSPITAL_COMMUNITY): Payer: Self-pay | Admitting: *Deleted

## 2018-02-19 ENCOUNTER — Ambulatory Visit (HOSPITAL_COMMUNITY): Admission: RE | Admit: 2018-02-19 | Payer: Medicare HMO | Source: Ambulatory Visit

## 2018-02-26 ENCOUNTER — Encounter (HOSPITAL_COMMUNITY): Payer: Medicare HMO

## 2018-02-27 ENCOUNTER — Ambulatory Visit (HOSPITAL_COMMUNITY)
Admission: RE | Admit: 2018-02-27 | Discharge: 2018-02-27 | Disposition: A | Discharge status: Home / Self Care | Payer: Medicare HMO | Source: Ambulatory Visit | Attending: Nephrology | Admitting: Nephrology

## 2018-02-27 DIAGNOSIS — N184 Chronic kidney disease, stage 4 (severe): Secondary | ICD-10-CM | POA: Insufficient documentation

## 2018-02-27 DIAGNOSIS — D631 Anemia in chronic kidney disease: Secondary | ICD-10-CM | POA: Diagnosis not present

## 2018-02-27 MED ORDER — SODIUM CHLORIDE 0.9 % IV SOLN
510.0000 mg | INTRAVENOUS | Status: DC
  Administered 2018-02-27: 10:00:00 510 mg via INTRAVENOUS
  Filled 2018-02-27: qty 17

## 2018-03-02 ENCOUNTER — Encounter: Payer: Self-pay | Admitting: Internal Medicine

## 2018-03-02 ENCOUNTER — Ambulatory Visit (INDEPENDENT_AMBULATORY_CARE_PROVIDER_SITE_OTHER): Payer: Medicare HMO | Admitting: Internal Medicine

## 2018-03-02 VITALS — BP 154/65 | HR 58 | Temp 97.5°F | Wt 202.0 lb

## 2018-03-02 DIAGNOSIS — N2581 Secondary hyperparathyroidism of renal origin: Secondary | ICD-10-CM | POA: Diagnosis not present

## 2018-03-02 DIAGNOSIS — I130 Hypertensive heart and chronic kidney disease with heart failure and stage 1 through stage 4 chronic kidney disease, or unspecified chronic kidney disease: Secondary | ICD-10-CM

## 2018-03-02 DIAGNOSIS — E1122 Type 2 diabetes mellitus with diabetic chronic kidney disease: Secondary | ICD-10-CM | POA: Diagnosis not present

## 2018-03-02 DIAGNOSIS — D631 Anemia in chronic kidney disease: Secondary | ICD-10-CM

## 2018-03-02 DIAGNOSIS — F1010 Alcohol abuse, uncomplicated: Secondary | ICD-10-CM

## 2018-03-02 DIAGNOSIS — E785 Hyperlipidemia, unspecified: Secondary | ICD-10-CM | POA: Diagnosis not present

## 2018-03-02 DIAGNOSIS — F17200 Nicotine dependence, unspecified, uncomplicated: Secondary | ICD-10-CM

## 2018-03-02 DIAGNOSIS — I25719 Atherosclerosis of autologous vein coronary artery bypass graft(s) with unspecified angina pectoris: Secondary | ICD-10-CM

## 2018-03-02 DIAGNOSIS — N401 Enlarged prostate with lower urinary tract symptoms: Secondary | ICD-10-CM

## 2018-03-02 DIAGNOSIS — I5022 Chronic systolic (congestive) heart failure: Secondary | ICD-10-CM

## 2018-03-02 DIAGNOSIS — N400 Enlarged prostate without lower urinary tract symptoms: Secondary | ICD-10-CM

## 2018-03-02 DIAGNOSIS — E669 Obesity, unspecified: Secondary | ICD-10-CM

## 2018-03-02 DIAGNOSIS — I739 Peripheral vascular disease, unspecified: Secondary | ICD-10-CM

## 2018-03-02 DIAGNOSIS — I70219 Atherosclerosis of native arteries of extremities with intermittent claudication, unspecified extremity: Secondary | ICD-10-CM | POA: Diagnosis not present

## 2018-03-02 DIAGNOSIS — E7800 Pure hypercholesterolemia, unspecified: Secondary | ICD-10-CM

## 2018-03-02 DIAGNOSIS — Z79899 Other long term (current) drug therapy: Secondary | ICD-10-CM

## 2018-03-02 DIAGNOSIS — E1151 Type 2 diabetes mellitus with diabetic peripheral angiopathy without gangrene: Secondary | ICD-10-CM | POA: Diagnosis not present

## 2018-03-02 DIAGNOSIS — Z6831 Body mass index (BMI) 31.0-31.9, adult: Secondary | ICD-10-CM

## 2018-03-02 DIAGNOSIS — R351 Nocturia: Secondary | ICD-10-CM

## 2018-03-02 DIAGNOSIS — I10 Essential (primary) hypertension: Secondary | ICD-10-CM

## 2018-03-02 DIAGNOSIS — I25718 Atherosclerosis of autologous vein coronary artery bypass graft(s) with other forms of angina pectoris: Secondary | ICD-10-CM

## 2018-03-02 DIAGNOSIS — F1011 Alcohol abuse, in remission: Secondary | ICD-10-CM

## 2018-03-02 DIAGNOSIS — Z720 Tobacco use: Secondary | ICD-10-CM

## 2018-03-02 DIAGNOSIS — N184 Chronic kidney disease, stage 4 (severe): Secondary | ICD-10-CM | POA: Diagnosis not present

## 2018-03-02 LAB — GLUCOSE, CAPILLARY: Glucose-Capillary: 112 mg/dL — ABNORMAL HIGH (ref 65–99)

## 2018-03-02 LAB — POCT GLYCOSYLATED HEMOGLOBIN (HGB A1C): HEMOGLOBIN A1C: 5.8

## 2018-03-02 MED ORDER — TERAZOSIN HCL 10 MG PO CAPS: 10.0000 mg | ORAL_CAPSULE | Freq: Every day | ORAL | 3 refills | Status: DC

## 2018-03-02 MED ORDER — FUROSEMIDE 80 MG PO TABS: 80.0000 mg | ORAL_TABLET | Freq: Every day | ORAL | 3 refills | Status: DC

## 2018-03-02 MED ORDER — MULTIVITAMIN ADULT PO TABS: 1.0000 | ORAL_TABLET | Freq: Every morning | ORAL | 3 refills | Status: DC

## 2018-03-02 MED ORDER — FINASTERIDE 5 MG PO TABS: 5.0000 mg | ORAL_TABLET | Freq: Every day | ORAL | 3 refills | Status: DC

## 2018-03-02 MED ORDER — METOPROLOL SUCCINATE ER 100 MG PO TB24: 100.0000 mg | ORAL_TABLET | Freq: Every day | ORAL | 3 refills | Status: DC

## 2018-03-02 MED ORDER — ATORVASTATIN CALCIUM 20 MG PO TABS: 20.0000 mg | ORAL_TABLET | Freq: Every day | ORAL | 3 refills | Status: AC

## 2018-03-02 MED ORDER — AMLODIPINE BESYLATE 10 MG PO TABS
10.0000 mg | ORAL_TABLET | Freq: Every day | ORAL | 3 refills | Status: DC
Start: 2018-03-02 — End: 2018-07-26

## 2018-03-02 NOTE — Assessment & Plan Note (Signed)
Assessment  He is tolerating the atorvastatin 20 mg by mouth daily without myalgias.  Plan  We will continue with this high intensity statin and reassess for intolerances at the follow-up visit.

## 2018-03-02 NOTE — Assessment & Plan Note (Signed)
Assessment  His most recent hemoglobin was just above 8 approximately 2 weeks ago. He was therefore restarted on erythropoietin and Feraheme.  Plan  He will continue with the erythropoietin and Feraheme as per nephrology. I suspect they will be checking a repeat hemoglobin at the follow-up visit to assure it is no higher than 10.

## 2018-03-02 NOTE — Assessment & Plan Note (Signed)
Assessment  His weight today is 202 pounds this is down 3 pounds from his most recent visit. Although the target was 200 pounds for this visit he has made progress and was praised.  Plan  He will continue to watch his diet and portion size with a new goal of losing 3 pounds over the next 3 months to get under 200 pounds.

## 2018-03-02 NOTE — Assessment & Plan Note (Signed)
Assessment  He states the finasteride has helped his nocturia. That said, there seems to be some confusion regarding the terazosin and I am concerned he may not be taking this.  Plan  We will continue the finasteride at 5 mg by mouth daily and I encouraged him to take the terazosin at 10 mg by mouth each night. He is to bring his bottles in at the next visit so I can confirm what he is and is not taking.

## 2018-03-02 NOTE — Assessment & Plan Note (Signed)
Assessment  He notes bilateral calf tightness consistent with mild claudication after ambulation of about 150 yards.He does not have any pain in the lower extremities when at rest. When he develops this calf tightness it resolves with rest.  Plan  We will continue aggressive control of his diabetes, hypertension, and hyperlipidemia as described elsewhere. We are holding off on antiplatelet therapy acutely given his extremely low eGFR.  We will reassess for progression of his claudication symptoms at the follow-up visit.

## 2018-03-02 NOTE — Assessment & Plan Note (Signed)
Assessment  Kevin Castillo states he is taking vitamin D per the nephrology department. He brings in their medication list and it is confirmed he is taking vitamin D.  Plan  He is to continue the vitamin D that is prescribed by nephrology. I suspect they will be rechecking a PTH at the follow-up visit to assure he is making progress in this area on the vitamin D supplementation.

## 2018-03-02 NOTE — Assessment & Plan Note (Signed)
Assessment  He states over the last 2 months he has not had any alcohol. This is an improvement from the last visit where he would have a pint of gin on the weekends. He was praised for his new found alcohol abstinence which I believe will help with his blood pressure and weight.  Plan  He was encouraged to remain alcohol free and we will reassess his alcohol use at the follow-up visit.

## 2018-03-02 NOTE — Assessment & Plan Note (Signed)
Assessment  He denies any angina on his current antianginal regimen which includes Toprol-XL 100 mg by mouth daily, amlodipine 10 mg by mouth daily,and atorvastatin 20 mg by mouth daily.  Plan  We will continue the Toprol-XL at 100 mg by mouth daily, amlodipine at 10 mg by mouth daily, and atorvastatin at 20 mg by mouth daily. We are avoiding aspirin at this time given the concern for its potential effect on decreasing the glomerular filtration rate even lower in the setting of what now appears to be stage V chronic kidney disease.

## 2018-03-02 NOTE — Assessment & Plan Note (Signed)
Assessment  He denies any orthopnea, paroxysmal nocturnal dyspnea, or lower extremity edema on his current regimen of furosemide 80 mg by mouth daily and Toprol-XL 100 mg by mouth daily. ACE inhibition is contraindicated given he is nearing stage V chronic kidney disease.  Plan  We will continue with the furosemide at 80 mg by mouth daily and Toprol-XL at 100 mg by mouth daily. We will reassess for evidence of decompensation in his chronic systolic heart failure at the follow-up visit.

## 2018-03-02 NOTE — Progress Notes (Signed)
   Subjective:    Patient ID: Kevin Castillo, male    DOB: [birth year], 72 y.o.   MRN: [id number]  HPI  [name] is here for follow-up of his type 2 diabetes, K by stage IV chronic kidney disease, coronary artery disease with stable angina, peripheral vascular occlusive disease, chronic systolic heart failure, essential hypertension, hyperlipidemia, alcohol and tobacco abuse,anemia of chronic kidney failure stage IV, and secondary hyperparathyroidism of renal origin.Marland Kitchen Please see the A&P for the status of the pt's chronic medical problems.  Review of Systems  Constitutional: Positive for appetite change. Negative for activity change, fatigue and unexpected weight change.  Respiratory: Negative for chest tightness, shortness of breath and wheezing.   Cardiovascular: Negative for chest pain, palpitations and leg swelling.  Gastrointestinal: Negative for abdominal distention, abdominal pain, diarrhea, nausea and vomiting.  Genitourinary: Negative for difficulty urinating, frequency and urgency.  Musculoskeletal: Negative for arthralgias, back pain and myalgias.  Skin: Negative for rash and wound.      Objective:   Physical Exam  Constitutional: He is oriented to person, place, and time. He appears well-developed and well-nourished. No distress.  HENT:  Head: Normocephalic and atraumatic.  Eyes: Conjunctivae are normal. Right eye exhibits no discharge. Left eye exhibits no discharge. No scleral icterus.  Musculoskeletal: Normal range of motion. He exhibits no edema, tenderness or deformity.  Neurological: He is alert and oriented to person, place, and time. He exhibits normal muscle tone.  Skin: Skin is warm and dry. No rash noted. He is not diaphoretic. No erythema.  Psychiatric: He has a normal mood and affect. His behavior is normal. Judgment and thought content normal.  Nursing note and vitals reviewed.     Assessment & Plan:   Please see problem oriented charting.

## 2018-03-02 NOTE — Progress Notes (Signed)
58

## 2018-03-02 NOTE — Assessment & Plan Note (Signed)
Assessment  Unfortunately he continues to smoke. He remains in the pre-contemplative stage, fully understanding the importance of smoking cessation given his vascular disease.  At this point, he is not interested in bupropion therapy. He does not tolerate the nicotine patches well and would not want to start this therapy once he gets to the contemplative stage. Given his renal disease he is not a candidate for Chantix.  Plan  I asked that he contact me should he change his mind and be mentally ready to quit smoking, especially if he was interested in trying bupropion. We will reassess his thoughts on smoking cessation and readiness for an attempt at the follow-up visit.

## 2018-03-02 NOTE — Assessment & Plan Note (Signed)
Assessment  His most recent eGFR was 11 putting him at the stage V chronic kidney disease level. This is an isolated reading and would need to be repeated to confirm that he is now progress to stage V chronic kidney disease. He is being followed closely by nephrology and they will likely repeat this measurement at the follow-up visit. At this time, his most recent potassium remains less than 4.5, he denies any nausea or vomiting, but does state he has had a decrease in his appetite. He also denies any lower extremity swelling, orthopnea, or paroxysmal nocturnal dyspnea to suggest volume overload.  Plan  He will continue to follow with nephrology as an outpatient. They will decide when to pull the trigger on establishing a vascular access site in anticipation of future hemodialysis. At this point, he continues to lack an indication for hemodialysis.

## 2018-03-02 NOTE — Patient Instructions (Signed)
It was good to see you.  You are doing a good job with your health.  1) Keep taking the medications as you are.  2) Follow-up with the eye and kidney doctors as scheduled.  3) Please bring in all of your medications at the next visit so we can figure out which one you may not be taking.  4) Let me know if you would like a medication to help quit smoking.  I will see you back in 3 months, sooner if necessary.

## 2018-03-02 NOTE — Assessment & Plan Note (Signed)
Assessment  His blood pressure remains above target today at 154/65. The antihypertensive regimen he is prescribed includes amlodipine 10 mg by mouth daily,Toprol-XL 100 mg by mouth daily, terazosin 10 mg by mouth each night, and furosemide 80 mg by mouth daily.  There seems to be some confusion as to what his antihypertensive should be and I am concerned he may not be taking the terazosin.  Plan  I went over the 4 antihypertensive he should be taking and provided him with a list in which to use as a guide. He will continue to take these medications as prescribed, but I asked that he bring in all the bottles at the next visit so I can confirm which medications he was taking. Again, I remain concerned he may not be taking the terazosin. Therefore, I have not added an additional medication until I confirm that he is, in fact, taking the 4 medications as prescribed. We will make this decision at the follow-up visit when he brings his medication bottles in and we reassess his blood pressure at that time.

## 2018-03-02 NOTE — Assessment & Plan Note (Signed)
Assessment  His glycemic control is excellent with hemoglobin A1c today of 5.8. I realize that some of this is may be related to his anemia and quick cell turnover. That said, his fasting glucose is 112 today and has not been any higher than the mid 130s recently. Thus, I tend to believe that his hyperglycemia still remains at target even if the 5.8 is an over estimation of control. This is with lifestyle modifications only and he was praised at his continued work with his diet and activity. With regards to his stage IV chronic kidney disease, the last time his renal function was checked was in mid February by his nephrologist. At that time his eGFR was 11.Thus, if he were to have another reading less than 15 he would need to be upgraded to stage V chronic kidney disease.  Plan  He will continue with his lifestyle modification to control his hyperglycemia. He is awaiting an appointment from his ophthalmologist as his eye exam is due. We will reassess his glycemic control at the follow-up visit with a repeat hemoglobin A1c and fasting glucose. With regards to his chronic kidney disease he is following closely with nephrology.

## 2018-03-02 NOTE — Assessment & Plan Note (Signed)
He is up-to-date on his health care maintenance.

## 2018-03-06 ENCOUNTER — Encounter (HOSPITAL_COMMUNITY)
Admission: RE | Admit: 2018-03-06 | Discharge: 2018-03-06 | Disposition: A | Discharge status: Home / Self Care | Payer: Medicare HMO | Source: Ambulatory Visit | Attending: Nephrology | Admitting: Nephrology

## 2018-03-06 VITALS — BP 171/79 | HR 73 | Temp 98.1°F | Resp 20 | Ht 67.0 in | Wt 222.0 lb

## 2018-03-06 DIAGNOSIS — N184 Chronic kidney disease, stage 4 (severe): Secondary | ICD-10-CM | POA: Diagnosis not present

## 2018-03-06 DIAGNOSIS — Z79899 Other long term (current) drug therapy: Secondary | ICD-10-CM | POA: Diagnosis not present

## 2018-03-06 DIAGNOSIS — D631 Anemia in chronic kidney disease: Secondary | ICD-10-CM | POA: Insufficient documentation

## 2018-03-06 DIAGNOSIS — Z5181 Encounter for therapeutic drug level monitoring: Secondary | ICD-10-CM | POA: Insufficient documentation

## 2018-03-06 LAB — POCT HEMOGLOBIN-HEMACUE: HEMOGLOBIN: 8.9 g/dL — AB (ref 13.0–17.0)

## 2018-03-06 MED ORDER — EPOETIN ALFA 10000 UNIT/ML IJ SOLN
5000.0000 [IU] | INTRAMUSCULAR | Status: DC
  Administered 2018-03-06: 10:00:00 5000 [IU] via SUBCUTANEOUS

## 2018-03-06 MED ORDER — EPOETIN ALFA 10000 UNIT/ML IJ SOLN
INTRAMUSCULAR | Status: AC
  Filled 2018-03-06: qty 1

## 2018-03-06 MED ORDER — SODIUM CHLORIDE 0.9 % IV SOLN
510.0000 mg | INTRAVENOUS | Status: DC
  Administered 2018-03-06: 10:00:00 510 mg via INTRAVENOUS
  Filled 2018-03-06: qty 17

## 2018-03-13 ENCOUNTER — Inpatient Hospital Stay (HOSPITAL_COMMUNITY): Admission: RE | Admit: 2018-03-13 | Payer: Medicare HMO | Source: Ambulatory Visit

## 2018-03-19 ENCOUNTER — Encounter (HOSPITAL_COMMUNITY): Payer: Medicare HMO

## 2018-03-20 ENCOUNTER — Encounter (HOSPITAL_COMMUNITY): Payer: Medicare HMO

## 2018-03-21 ENCOUNTER — Encounter (HOSPITAL_COMMUNITY)
Admission: RE | Admit: 2018-03-21 | Discharge: 2018-03-21 | Disposition: A | Discharge status: Home / Self Care | Payer: Medicare HMO | Source: Ambulatory Visit | Attending: Nephrology | Admitting: Nephrology

## 2018-03-21 VITALS — BP 153/78 | HR 79 | Temp 98.5°F | Resp 20

## 2018-03-21 DIAGNOSIS — Z79899 Other long term (current) drug therapy: Secondary | ICD-10-CM | POA: Insufficient documentation

## 2018-03-21 DIAGNOSIS — N184 Chronic kidney disease, stage 4 (severe): Secondary | ICD-10-CM | POA: Insufficient documentation

## 2018-03-21 DIAGNOSIS — Z5181 Encounter for therapeutic drug level monitoring: Secondary | ICD-10-CM | POA: Insufficient documentation

## 2018-03-21 DIAGNOSIS — D631 Anemia in chronic kidney disease: Secondary | ICD-10-CM | POA: Insufficient documentation

## 2018-03-21 LAB — IRON AND TIBC
IRON: 45 ug/dL (ref 45–182)
SATURATION RATIOS: 15 % — AB (ref 17.9–39.5)
TIBC: 294 ug/dL (ref 250–450)
UIBC: 249 ug/dL

## 2018-03-21 LAB — FERRITIN: Ferritin: 186 ng/mL (ref 24–336)

## 2018-03-21 LAB — POCT HEMOGLOBIN-HEMACUE: Hemoglobin: 8.3 g/dL — ABNORMAL LOW (ref 13.0–17.0)

## 2018-03-21 MED ORDER — EPOETIN ALFA 10000 UNIT/ML IJ SOLN
INTRAMUSCULAR | Status: AC
  Filled 2018-03-21: qty 1

## 2018-03-21 MED ORDER — EPOETIN ALFA 10000 UNIT/ML IJ SOLN
5000.0000 [IU] | INTRAMUSCULAR | Status: DC
  Administered 2018-03-21: 5000 [IU] via SUBCUTANEOUS

## 2018-03-21 MED ORDER — EPOETIN ALFA 10000 UNIT/ML IJ SOLN
INTRAMUSCULAR | Status: AC
  Administered 2018-03-21: 5000 [IU] via SUBCUTANEOUS
  Filled 2018-03-21: qty 1

## 2018-03-27 ENCOUNTER — Encounter (HOSPITAL_COMMUNITY): Payer: Medicare HMO

## 2018-03-28 ENCOUNTER — Encounter (HOSPITAL_COMMUNITY)
Admission: RE | Admit: 2018-03-28 | Discharge: 2018-03-28 | Disposition: A | Discharge status: Home / Self Care | Payer: Medicare HMO | Source: Ambulatory Visit | Attending: Nephrology | Admitting: Nephrology

## 2018-03-28 VITALS — BP 172/70 | HR 79 | Temp 97.5°F | Resp 20

## 2018-03-28 DIAGNOSIS — N184 Chronic kidney disease, stage 4 (severe): Secondary | ICD-10-CM

## 2018-03-28 DIAGNOSIS — Z79899 Other long term (current) drug therapy: Secondary | ICD-10-CM | POA: Diagnosis not present

## 2018-03-28 DIAGNOSIS — D631 Anemia in chronic kidney disease: Secondary | ICD-10-CM | POA: Diagnosis not present

## 2018-03-28 DIAGNOSIS — Z5181 Encounter for therapeutic drug level monitoring: Secondary | ICD-10-CM | POA: Diagnosis not present

## 2018-03-28 LAB — POCT HEMOGLOBIN-HEMACUE: HEMOGLOBIN: 9.4 g/dL — AB (ref 13.0–17.0)

## 2018-03-28 MED ORDER — EPOETIN ALFA 10000 UNIT/ML IJ SOLN
5000.0000 [IU] | INTRAMUSCULAR | Status: DC
  Administered 2018-03-28: 12:00:00 5000 [IU] via SUBCUTANEOUS

## 2018-03-28 MED ORDER — EPOETIN ALFA 10000 UNIT/ML IJ SOLN
INTRAMUSCULAR | Status: AC
  Filled 2018-03-28: qty 1

## 2018-04-04 ENCOUNTER — Encounter (HOSPITAL_COMMUNITY): Payer: Medicare HMO

## 2018-04-09 ENCOUNTER — Encounter (HOSPITAL_COMMUNITY): Payer: Medicare HMO

## 2018-04-11 ENCOUNTER — Encounter (HOSPITAL_COMMUNITY): Payer: Medicare HMO

## 2018-04-13 ENCOUNTER — Encounter (HOSPITAL_COMMUNITY)
Admission: RE | Admit: 2018-04-13 | Discharge: 2018-04-13 | Disposition: A | Discharge status: Home / Self Care | Payer: Medicare HMO | Source: Ambulatory Visit | Attending: Nephrology | Admitting: Nephrology

## 2018-04-13 VITALS — BP 157/66 | HR 73 | Temp 97.8°F | Resp 20

## 2018-04-13 DIAGNOSIS — N184 Chronic kidney disease, stage 4 (severe): Secondary | ICD-10-CM

## 2018-04-13 DIAGNOSIS — Z79899 Other long term (current) drug therapy: Secondary | ICD-10-CM | POA: Diagnosis not present

## 2018-04-13 DIAGNOSIS — D631 Anemia in chronic kidney disease: Secondary | ICD-10-CM | POA: Diagnosis not present

## 2018-04-13 DIAGNOSIS — Z5181 Encounter for therapeutic drug level monitoring: Secondary | ICD-10-CM | POA: Diagnosis not present

## 2018-04-13 LAB — POCT HEMOGLOBIN-HEMACUE: HEMOGLOBIN: 9.8 g/dL — AB (ref 13.0–17.0)

## 2018-04-13 MED ORDER — EPOETIN ALFA 10000 UNIT/ML IJ SOLN
5000.0000 [IU] | INTRAMUSCULAR | Status: DC
  Administered 2018-04-13: 5000 [IU] via SUBCUTANEOUS

## 2018-04-13 MED ORDER — EPOETIN ALFA 10000 UNIT/ML IJ SOLN
INTRAMUSCULAR | Status: AC
  Filled 2018-04-13: qty 1

## 2018-04-16 ENCOUNTER — Encounter (HOSPITAL_COMMUNITY): Payer: Medicare HMO

## 2018-04-20 ENCOUNTER — Encounter (HOSPITAL_COMMUNITY): Payer: Medicare HMO

## 2018-04-27 ENCOUNTER — Ambulatory Visit (HOSPITAL_COMMUNITY)
Admission: RE | Admit: 2018-04-27 | Discharge: 2018-04-27 | Disposition: A | Discharge status: Home / Self Care | Payer: Medicare HMO | Source: Ambulatory Visit | Attending: Nephrology | Admitting: Nephrology

## 2018-04-27 VITALS — BP 163/83 | HR 72 | Temp 97.9°F | Resp 20

## 2018-04-27 DIAGNOSIS — N184 Chronic kidney disease, stage 4 (severe): Secondary | ICD-10-CM

## 2018-04-27 DIAGNOSIS — D631 Anemia in chronic kidney disease: Secondary | ICD-10-CM | POA: Diagnosis not present

## 2018-04-27 DIAGNOSIS — N189 Chronic kidney disease, unspecified: Secondary | ICD-10-CM | POA: Diagnosis not present

## 2018-04-27 LAB — IRON AND TIBC
Iron: 63 ug/dL (ref 45–182)
SATURATION RATIOS: 21 % (ref 17.9–39.5)
TIBC: 301 ug/dL (ref 250–450)
UIBC: 238 ug/dL

## 2018-04-27 LAB — FERRITIN: FERRITIN: 84 ng/mL (ref 24–336)

## 2018-04-27 LAB — POCT HEMOGLOBIN-HEMACUE: HEMOGLOBIN: 10.4 g/dL — AB (ref 13.0–17.0)

## 2018-04-27 MED ORDER — EPOETIN ALFA 10000 UNIT/ML IJ SOLN
INTRAMUSCULAR | Status: AC
  Administered 2018-04-27: 5000 [IU] via SUBCUTANEOUS
  Filled 2018-04-27: qty 1

## 2018-04-27 MED ORDER — EPOETIN ALFA 10000 UNIT/ML IJ SOLN
5000.0000 [IU] | INTRAMUSCULAR | Status: DC
  Administered 2018-04-27: 5000 [IU] via SUBCUTANEOUS

## 2018-05-03 ENCOUNTER — Encounter: Payer: Self-pay | Admitting: *Deleted

## 2018-05-04 ENCOUNTER — Encounter (HOSPITAL_COMMUNITY): Payer: Medicare HMO

## 2018-05-09 DIAGNOSIS — E1122 Type 2 diabetes mellitus with diabetic chronic kidney disease: Secondary | ICD-10-CM | POA: Diagnosis not present

## 2018-05-09 DIAGNOSIS — Z6832 Body mass index (BMI) 32.0-32.9, adult: Secondary | ICD-10-CM | POA: Diagnosis not present

## 2018-05-09 DIAGNOSIS — N185 Chronic kidney disease, stage 5: Secondary | ICD-10-CM | POA: Diagnosis not present

## 2018-05-09 DIAGNOSIS — E875 Hyperkalemia: Secondary | ICD-10-CM | POA: Diagnosis not present

## 2018-05-09 DIAGNOSIS — Z720 Tobacco use: Secondary | ICD-10-CM | POA: Diagnosis not present

## 2018-05-09 DIAGNOSIS — I25810 Atherosclerosis of coronary artery bypass graft(s) without angina pectoris: Secondary | ICD-10-CM | POA: Diagnosis not present

## 2018-05-09 DIAGNOSIS — N2581 Secondary hyperparathyroidism of renal origin: Secondary | ICD-10-CM | POA: Diagnosis not present

## 2018-05-09 DIAGNOSIS — D631 Anemia in chronic kidney disease: Secondary | ICD-10-CM | POA: Diagnosis not present

## 2018-05-09 DIAGNOSIS — I739 Peripheral vascular disease, unspecified: Secondary | ICD-10-CM | POA: Diagnosis not present

## 2018-05-18 ENCOUNTER — Other Ambulatory Visit: Payer: Self-pay | Admitting: Internal Medicine

## 2018-05-18 DIAGNOSIS — N2581 Secondary hyperparathyroidism of renal origin: Secondary | ICD-10-CM

## 2018-05-18 MED ORDER — CALCITRIOL 0.25 MCG PO CAPS: 0.2500 ug | ORAL_CAPSULE | Freq: Every day | ORAL | 3 refills | Status: DC

## 2018-06-01 ENCOUNTER — Encounter (INDEPENDENT_AMBULATORY_CARE_PROVIDER_SITE_OTHER): Payer: Self-pay

## 2018-06-01 ENCOUNTER — Other Ambulatory Visit: Payer: Self-pay

## 2018-06-01 ENCOUNTER — Ambulatory Visit (INDEPENDENT_AMBULATORY_CARE_PROVIDER_SITE_OTHER): Payer: Medicare HMO | Admitting: Internal Medicine

## 2018-06-01 ENCOUNTER — Encounter: Payer: Self-pay | Admitting: Internal Medicine

## 2018-06-01 VITALS — BP 174/65 | HR 69 | Temp 98.2°F | Ht 67.0 in | Wt 200.6 lb

## 2018-06-01 DIAGNOSIS — I25719 Atherosclerosis of autologous vein coronary artery bypass graft(s) with unspecified angina pectoris: Secondary | ICD-10-CM

## 2018-06-01 DIAGNOSIS — R351 Nocturia: Secondary | ICD-10-CM

## 2018-06-01 DIAGNOSIS — N401 Enlarged prostate with lower urinary tract symptoms: Secondary | ICD-10-CM

## 2018-06-01 DIAGNOSIS — E1122 Type 2 diabetes mellitus with diabetic chronic kidney disease: Secondary | ICD-10-CM

## 2018-06-01 DIAGNOSIS — I5022 Chronic systolic (congestive) heart failure: Secondary | ICD-10-CM | POA: Diagnosis not present

## 2018-06-01 DIAGNOSIS — E7800 Pure hypercholesterolemia, unspecified: Secondary | ICD-10-CM

## 2018-06-01 DIAGNOSIS — I739 Peripheral vascular disease, unspecified: Secondary | ICD-10-CM | POA: Diagnosis not present

## 2018-06-01 DIAGNOSIS — E66811 Obesity, class 1: Secondary | ICD-10-CM

## 2018-06-01 DIAGNOSIS — N185 Chronic kidney disease, stage 5: Secondary | ICD-10-CM

## 2018-06-01 DIAGNOSIS — N2581 Secondary hyperparathyroidism of renal origin: Secondary | ICD-10-CM

## 2018-06-01 DIAGNOSIS — Z720 Tobacco use: Secondary | ICD-10-CM | POA: Diagnosis not present

## 2018-06-01 DIAGNOSIS — E669 Obesity, unspecified: Secondary | ICD-10-CM | POA: Diagnosis not present

## 2018-06-01 DIAGNOSIS — D631 Anemia in chronic kidney disease: Secondary | ICD-10-CM

## 2018-06-01 DIAGNOSIS — I10 Essential (primary) hypertension: Secondary | ICD-10-CM | POA: Diagnosis not present

## 2018-06-01 LAB — GLUCOSE, CAPILLARY: Glucose-Capillary: 104 mg/dL — ABNORMAL HIGH (ref 65–99)

## 2018-06-01 LAB — POCT GLYCOSYLATED HEMOGLOBIN (HGB A1C): Hemoglobin A1C: 5.8 % — AB (ref 4.0–5.6)

## 2018-06-01 MED ORDER — FINASTERIDE 5 MG PO TABS: 5.0000 mg | ORAL_TABLET | Freq: Every day | ORAL | 11 refills | Status: AC

## 2018-06-01 NOTE — Patient Instructions (Signed)
It was good to see you.  I am glad your kidneys are stable but you are still having trouble with your urine.  1) I sent the finasteride prescription back in.  This is for your urine.  I hope this works for your urination.  If so, we may be able to be more aggressive with your fluid pill.  2) Take the new medication at night (Calcitriol) if it is making you sleepy.  3) Take the rest of your medications as you are.  Please do not stop any of them when you get the finasteride.  I will see you back in 3 months, sooner if necessary.

## 2018-06-01 NOTE — Progress Notes (Signed)
   Subjective:    Patient ID: Kevin Castillo, male    DOB: 02/02/1946, 72 y.o.   MRN: [id number]  HPI  Kevin Castillo is here for follow-up of type 2 diabetes complicated by stage 5 chronic kidney disease, hyperlipidemia, essential hypertension, coronary artery disease with stable angina, peripheral vascular disease, tobacco abuse, benign prostatic hypertrophy with bladder outlet obstruction symptoms, chronic systolic heart failure, anemic of stage 5 chronic renal failure, and secondary hyperparathyroidism of renal origin. Please see the A&P for the status of the pt's chronic medical problems.  Review of Systems  Constitutional: Positive for appetite change and fever. Negative for activity change and unexpected weight change.       Decreased appetitie  HENT: Positive for drooling.   Respiratory: Positive for cough and shortness of breath. Negative for chest tightness.   Cardiovascular: Positive for leg swelling. Negative for chest pain and palpitations.  Gastrointestinal: Negative for abdominal pain, constipation, diarrhea, nausea and vomiting.  Genitourinary: Positive for difficulty urinating, frequency and urgency. Negative for dysuria.  Musculoskeletal: Negative for arthralgias, gait problem and myalgias.  Skin: Negative for rash and wound.  Neurological: Negative for syncope and light-headedness.  Psychiatric/Behavioral: Positive for sleep disturbance.      Objective:   Physical Exam  Constitutional: He is oriented to person, place, and time. He appears well-developed and well-nourished. No distress.  HENT:  Head: Normocephalic and atraumatic.  Eyes: Conjunctivae are normal. Right eye exhibits no discharge. Left eye exhibits no discharge. No scleral icterus.  Cardiovascular: Normal rate, regular rhythm and normal heart sounds. Exam reveals no gallop and no friction rub.  No murmur heard. Pulmonary/Chest: Effort normal and breath sounds normal. No respiratory distress. He has no  wheezes. He has no rales.  Abdominal: Soft. Bowel sounds are normal. He exhibits no distension. There is no tenderness. There is no rebound and no guarding.  Musculoskeletal: He exhibits edema. He exhibits no tenderness or deformity.  Neurological: He is alert and oriented to person, place, and time. He exhibits normal muscle tone.  Skin: Skin is warm and dry. No rash noted. He is not diaphoretic. No erythema.  Psychiatric: He has a normal mood and affect. His behavior is normal. Judgment and thought content normal.  Nursing note and vitals reviewed.     Assessment & Plan:   Please see problem oriented charting.

## 2018-06-01 NOTE — Assessment & Plan Note (Signed)
Assessment  His stage V chronic kidney disease has remained stable over the last several months. He has continued to be closely followed by nephrology. He does not have any indications to begin hemodialysis at this time.  His anemia of stage V chronic kidney disease and secondary hyperparathyroidism are being managed appropriately as noted elsewhere.  Plan  He will continue to follow closely with nephrology to determine when hemodialysis may be required.

## 2018-06-01 NOTE — Assessment & Plan Note (Signed)
Assessment  He is tolerating the atorvastatin 20 mg by mouth daily without myalgias.  Plan  We will continue with this moderate intensity statin and reassess for intolerances at the follow-up visit.

## 2018-06-01 NOTE — Assessment & Plan Note (Signed)
Assessment  Despite evidence of mild volume overload he remains asymptomatic without orthopnea or paroxysmal nocturnal dyspnea. He would benefit from improved afterload reduction although, given his stage V chronic kidney disease, management of his volume status may achieve the same outcome.  Plan  We are working on his bladder outlet obstruction regimen so that we may be more aggressive with his loop diuretics and increase the dose of furosemide to 80 mg by mouth twice daily at the follow-up visit.  We will reassess whether or not he was able to obtain both medications for his benign prostatic hypertrophy as ordered once again.

## 2018-06-01 NOTE — Assessment & Plan Note (Signed)
We will discuss the need for repeat retinal examination given his retinopathy at the follow-up visit. He is otherwise up-to-date on his health care maintenance.

## 2018-06-01 NOTE — Assessment & Plan Note (Signed)
Assessment  He continues to have mild claudication symptoms when he walks longer distances of at least 300 feet. These have been unchanged over the last several months.  Plan  We are continuing to aggressively manage his cardiovascular risk factors including his essential hypertension, diabetes, tobacco abuse, and hyperlipidemia. Unfortunately, we are struggling with managing his hypertension as noted above although we have a plan in place. He also remains in the pre-contemplative stage for the management of his tobacco abuse. We will reassess for progression of his claudication symptoms at the follow-up visit.

## 2018-06-01 NOTE — Assessment & Plan Note (Signed)
Assessment  We have been struggling to get him both finasteride and Terazosin to be taken at the same time in order to more aggressively manage his benign prostatic hypertrophy with bladder outlet obstructive symptoms. Even though I rewrote for both the Terazosin and the finasteride at the last clinic visit, his mail order pharmacy only sent him Terazosin. He continues to have nocturia every 2-3 hours with frequency and urgency. This symptom must be improved if we are to better control his hypertension by aggressively managing his volume, given his stage V chronic kidney disease.  Plan  We will continue Terazosin 10 mg by mouth daily, and I have rewritten the finasteride at 5 mg by mouth daily in hopes that the mail order pharmacy will send it at this time. He was encouraged to take the finasteride when it arrives, in addition to all of the medications he currently has in possession including the Terazosin 10 mg by mouth daily.

## 2018-06-01 NOTE — Assessment & Plan Note (Signed)
Assessment  He has not had any symptoms of angina while on his current regimen. This includes amlodipine 10 mg by mouth daily, Toprol-XL100 mg by mouth daily, and atorvastatin 20 mg by mouth at night. He is currently not on aspirin given his stage V chronic kidney disease.  Plan  We will continue the amlodipine, Toprol-XL, and atorvastatin at the current doses and reassess for evidence of angina at the follow-up visit.

## 2018-06-01 NOTE — Assessment & Plan Note (Signed)
Assessment  He continues to slowly lose weight and has lost 2 pounds since the last clinic visit just over 2 months ago. His current weight is now 200 pounds. This has been accomplished by decreasing his portions.  He no longer feels he needs to eat as much and this has been easy to accomplish.  Plan  He was encouraged to continue with dietary modifications and limit his portion sizes. We will reassess his continued success at slow weight loss at the follow-up visit with a repeat weight.

## 2018-06-01 NOTE — Assessment & Plan Note (Signed)
Assessment  He continues to smoke and remains in the pre-contemplative stage not mentally ready to quit as of yet.  Plan  We will reassess his readiness for tobacco cessation at the follow-up visit. We will continue to educate him on the importance of smoking cessation with regards to its impact on his other chronic medical problems.

## 2018-06-01 NOTE — Assessment & Plan Note (Signed)
Assessment  His diabetes remains well controlled today with a hemoglobin A1c of 5.8. As noted during previous visits, this is likely an underestimation of his true hemoglobin A1c given his stage V chronic kidney disease with anemia, but his fasting sugars have also been well controlled during this time. This is currently on diet therapy alone.  He continues to follow closely with nephrology and is not felt to be a candidate for hemodialysis as of yet. Nonetheless, his anemia of chronic kidney disease and secondary hyperparathyroidism are being managed appropriately as noted elsewhere.  Plan  He was praised on his excellent control of his diabetes with diet alone. He was encouraged to continue to slowly lose weight and eat healthy, like he has been recently. We will reassess his diabetic control with a repeat hemoglobin A1c at the follow-up visit.  He will continue to follow closely with nephrology.

## 2018-06-01 NOTE — Assessment & Plan Note (Signed)
Assessment  He was recently started on calcitriol 0.25 g by mouth daily per nephrology. He states he does not feel well on this medication, but is unable to be more specific other than some increased somnolence.  Plan  I encouraged him to continue the calcitriol 0.25 g by mouth but change it to nightly since it makes him tired. We will reassess the efficacy of making this change with regards to its overall tolerance at the follow-up visit.

## 2018-06-01 NOTE — Assessment & Plan Note (Signed)
Assessment  His blood pressure remains elevated at 174/65.  This is on amlodipine 10 mg by mouth daily, Toprol-XL 100 mg by mouth daily,Terazosin 10 mg by mouth at night, and furosemide 80 mg by mouth daily.  Given his stage V chronic kidney disease I believe that much of this is volume related and he could benefit from increase in his furosemide to twice daily dosing at least. Unfortunately, this has been limited by our difficulty in getting him the appropriate regimen for his benign prostatic hypertrophy with bladder outlet obstructive symptoms. At the last visit, it was noted he was on finasteride, but was not taking the terazosin as prescribed. This was reordered and he has received it. At that visit, he was encouraged to continue to take the finasteride despite re-adding the Terazosin. Unfortunately, he apparently has not received the finasteride from his mail order pharmacy despite it being re-ordered at the last visit. He has been taking the Terazosin instead, but has not had any improvement in his symptoms. If we are to be more aggressive with his volume control, and this likely more successful in managing his hypertension given his stage V chronic kidney disease, we will need to be aggressively managing his bladder outlet obstructive symptoms and he will need to take both the Terazosin 10 mg by mouth daily and finasteride 5 mg by mouth daily.  Plan  I again reordered the finasteride 5 mg by mouth daily in hopes of having it sent to him this time. We are continuing the Terazosin at 10 mg by mouth daily. If we can get improvement in his symptoms on this dual regimen then we will increase his Lasix to 80 mg by mouth twice daily at the follow-up visit in hopes of improving his volume control, and thus blood pressure control.

## 2018-06-01 NOTE — Assessment & Plan Note (Signed)
Assessment  He is currently receiving EPO and Feraheme for his anemia of chronic kidney disease.  Plan  We will continue the epo and Feraheme as per nephrology in hopes of achieving a target hemoglobin of approximately 10.

## 2018-06-04 ENCOUNTER — Ambulatory Visit (HOSPITAL_COMMUNITY)
Admission: RE | Admit: 2018-06-04 | Discharge: 2018-06-04 | Disposition: A | Discharge status: Home / Self Care | Payer: Medicare HMO | Source: Ambulatory Visit | Attending: Nephrology | Admitting: Nephrology

## 2018-06-04 DIAGNOSIS — D631 Anemia in chronic kidney disease: Secondary | ICD-10-CM | POA: Diagnosis not present

## 2018-06-04 DIAGNOSIS — N184 Chronic kidney disease, stage 4 (severe): Secondary | ICD-10-CM | POA: Diagnosis not present

## 2018-06-04 DIAGNOSIS — N185 Chronic kidney disease, stage 5: Secondary | ICD-10-CM

## 2018-06-04 LAB — IRON AND TIBC
Iron: 35 ug/dL — ABNORMAL LOW (ref 45–182)
SATURATION RATIOS: 12 % — AB (ref 17.9–39.5)
TIBC: 291 ug/dL (ref 250–450)
UIBC: 256 ug/dL

## 2018-06-04 LAB — POCT HEMOGLOBIN-HEMACUE: HEMOGLOBIN: 9.6 g/dL — AB (ref 13.0–17.0)

## 2018-06-04 LAB — FERRITIN: Ferritin: 123 ng/mL (ref 24–336)

## 2018-06-04 MED ORDER — EPOETIN ALFA 10000 UNIT/ML IJ SOLN
INTRAMUSCULAR | Status: AC
  Filled 2018-06-04: qty 1

## 2018-06-04 MED ORDER — EPOETIN ALFA 10000 UNIT/ML IJ SOLN
5000.0000 [IU] | INTRAMUSCULAR | Status: DC
  Administered 2018-06-04: 5000 [IU] via SUBCUTANEOUS

## 2018-06-08 ENCOUNTER — Ambulatory Visit: Payer: Medicare HMO | Admitting: Internal Medicine

## 2018-06-11 ENCOUNTER — Inpatient Hospital Stay (HOSPITAL_COMMUNITY)
Admission: RE | Admit: 2018-06-11 | Discharge: 2018-06-11 | Disposition: A | Discharge status: Home / Self Care | Payer: Medicare HMO | Source: Ambulatory Visit | Attending: Nephrology | Admitting: Nephrology

## 2018-06-18 ENCOUNTER — Encounter (HOSPITAL_COMMUNITY): Payer: Medicare HMO

## 2018-06-18 DIAGNOSIS — N2581 Secondary hyperparathyroidism of renal origin: Secondary | ICD-10-CM | POA: Diagnosis not present

## 2018-06-22 DIAGNOSIS — R609 Edema, unspecified: Secondary | ICD-10-CM | POA: Diagnosis not present

## 2018-06-22 DIAGNOSIS — I120 Hypertensive chronic kidney disease with stage 5 chronic kidney disease or end stage renal disease: Secondary | ICD-10-CM | POA: Diagnosis not present

## 2018-06-22 DIAGNOSIS — N2581 Secondary hyperparathyroidism of renal origin: Secondary | ICD-10-CM | POA: Diagnosis not present

## 2018-06-22 DIAGNOSIS — D631 Anemia in chronic kidney disease: Secondary | ICD-10-CM | POA: Diagnosis not present

## 2018-06-22 DIAGNOSIS — N185 Chronic kidney disease, stage 5: Secondary | ICD-10-CM | POA: Diagnosis not present

## 2018-06-22 DIAGNOSIS — E785 Hyperlipidemia, unspecified: Secondary | ICD-10-CM | POA: Diagnosis not present

## 2018-06-22 DIAGNOSIS — Z720 Tobacco use: Secondary | ICD-10-CM | POA: Diagnosis not present

## 2018-06-22 DIAGNOSIS — E1122 Type 2 diabetes mellitus with diabetic chronic kidney disease: Secondary | ICD-10-CM | POA: Diagnosis not present

## 2018-06-22 DIAGNOSIS — Z6832 Body mass index (BMI) 32.0-32.9, adult: Secondary | ICD-10-CM | POA: Diagnosis not present

## 2018-06-25 ENCOUNTER — Telehealth: Payer: Self-pay | Admitting: Vascular Surgery

## 2018-06-25 NOTE — Telephone Encounter (Signed)
sch app lvm 07/05/18 330pm f/u MD

## 2018-07-05 ENCOUNTER — Other Ambulatory Visit: Payer: Self-pay | Admitting: *Deleted

## 2018-07-05 ENCOUNTER — Ambulatory Visit (INDEPENDENT_AMBULATORY_CARE_PROVIDER_SITE_OTHER): Payer: Medicare HMO | Admitting: Vascular Surgery

## 2018-07-05 ENCOUNTER — Encounter: Payer: Self-pay | Admitting: *Deleted

## 2018-07-05 ENCOUNTER — Encounter: Payer: Self-pay | Admitting: Vascular Surgery

## 2018-07-05 ENCOUNTER — Other Ambulatory Visit: Payer: Self-pay

## 2018-07-05 VITALS — BP 172/83 | HR 74 | Temp 98.8°F | Resp 20 | Ht 67.0 in | Wt 201.0 lb

## 2018-07-05 DIAGNOSIS — N184 Chronic kidney disease, stage 4 (severe): Secondary | ICD-10-CM

## 2018-07-05 NOTE — Progress Notes (Signed)
Patient is a 72 year old male who presents for placement of a long-term hemodialysis access area and we are requested at this point to place an AV graft in the dialysis catheter by Dr. Justin Mend.  The patient was previously seen 1 year ago July 2018 at that point he had an adequate vein for placement of a fistula.  He was not quite ready for dialysis.  He now is approaching that.  Plan at that point was to place a graft.  Dr. Justin Mend was also requested that we place a catheter at this time.  Review of systems: He denies shortness of breath.  He denies chest pain.  He does have shortness of breath with exertion.  He also has chronic leg swelling.  Physical exam:  Vitals:   07/05/18 1532  BP: (!) 172/83  Pulse: 74  Resp: 20  Temp: 98.8 F (37.1 C)  SpO2: 98%  Weight: 201 lb (91.2 kg)  Height: 5\' 7"  (1.702 m)    Extremities: 1+ radial pulses bilaterally no obvious visible veins on the skin surface  Data: I reviewed the patient's previous upper extremity arterial duplex which showed calcified arteries bilaterally.  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  Brachial artery anatomy was more normal on the right side with a reasonable caliber brachial artery.  Veins again were too small to use for a fistula.  Assessment: Patient needs long-term hemodialysis access.  Plan: Patient will be scheduled for placement of her right upper arm AV graft as well as a tunneled dialysis catheter on July 23, 2018.  Risk benefits possible complications of procedure details including but not limited to bleeding infection graft thrombosis requirement for other procedures ischemic steal were all explained to the patient today.  He understands and agrees to proceed.  Ruta Hinds, MD [facility] Office: [phone] Pager: [phone]

## 2018-07-06 ENCOUNTER — Encounter: Payer: Self-pay | Admitting: Nephrology

## 2018-07-11 ENCOUNTER — Other Ambulatory Visit: Payer: Self-pay | Admitting: Oncology

## 2018-07-11 ENCOUNTER — Other Ambulatory Visit: Payer: Self-pay

## 2018-07-11 ENCOUNTER — Ambulatory Visit (HOSPITAL_COMMUNITY)
Admission: RE | Admit: 2018-07-11 | Discharge: 2018-07-11 | Disposition: A | Discharge status: Home / Self Care | Payer: Medicare HMO | Source: Ambulatory Visit | Attending: Student in an Organized Health Care Education/Training Program | Admitting: Student in an Organized Health Care Education/Training Program

## 2018-07-11 ENCOUNTER — Ambulatory Visit (INDEPENDENT_AMBULATORY_CARE_PROVIDER_SITE_OTHER): Payer: Medicare HMO | Admitting: Internal Medicine

## 2018-07-11 VITALS — BP 152/70 | HR 64 | Temp 98.5°F | Ht 67.0 in | Wt 202.8 lb

## 2018-07-11 DIAGNOSIS — D631 Anemia in chronic kidney disease: Secondary | ICD-10-CM

## 2018-07-11 DIAGNOSIS — D61818 Other pancytopenia: Secondary | ICD-10-CM | POA: Diagnosis not present

## 2018-07-11 DIAGNOSIS — I5022 Chronic systolic (congestive) heart failure: Secondary | ICD-10-CM | POA: Diagnosis not present

## 2018-07-11 DIAGNOSIS — M48061 Spinal stenosis, lumbar region without neurogenic claudication: Secondary | ICD-10-CM | POA: Diagnosis not present

## 2018-07-11 DIAGNOSIS — R0781 Pleurodynia: Secondary | ICD-10-CM | POA: Diagnosis not present

## 2018-07-11 DIAGNOSIS — Z951 Presence of aortocoronary bypass graft: Secondary | ICD-10-CM

## 2018-07-11 DIAGNOSIS — D696 Thrombocytopenia, unspecified: Secondary | ICD-10-CM

## 2018-07-11 DIAGNOSIS — I2510 Atherosclerotic heart disease of native coronary artery without angina pectoris: Secondary | ICD-10-CM | POA: Diagnosis not present

## 2018-07-11 DIAGNOSIS — F1010 Alcohol abuse, uncomplicated: Secondary | ICD-10-CM

## 2018-07-11 DIAGNOSIS — R160 Hepatomegaly, not elsewhere classified: Secondary | ICD-10-CM | POA: Diagnosis not present

## 2018-07-11 DIAGNOSIS — I10 Essential (primary) hypertension: Secondary | ICD-10-CM | POA: Diagnosis not present

## 2018-07-11 DIAGNOSIS — N185 Chronic kidney disease, stage 5: Secondary | ICD-10-CM | POA: Diagnosis not present

## 2018-07-11 DIAGNOSIS — R0789 Other chest pain: Secondary | ICD-10-CM

## 2018-07-11 DIAGNOSIS — E1122 Type 2 diabetes mellitus with diabetic chronic kidney disease: Secondary | ICD-10-CM

## 2018-07-11 DIAGNOSIS — D72819 Decreased white blood cell count, unspecified: Secondary | ICD-10-CM

## 2018-07-11 DIAGNOSIS — M5136 Other intervertebral disc degeneration, lumbar region: Secondary | ICD-10-CM | POA: Diagnosis not present

## 2018-07-11 DIAGNOSIS — M545 Low back pain: Secondary | ICD-10-CM | POA: Diagnosis not present

## 2018-07-11 DIAGNOSIS — F1011 Alcohol abuse, in remission: Secondary | ICD-10-CM

## 2018-07-11 MED ORDER — HYDROCODONE-ACETAMINOPHEN 5-325 MG PO TABS: 1.0000 | ORAL_TABLET | Freq: Four times a day (QID) | ORAL | 0 refills | Status: DC | PRN

## 2018-07-11 MED ORDER — OXYCODONE-ACETAMINOPHEN 5-325 MG PO TABS
2.0000 | ORAL_TABLET | Freq: Once | ORAL | Status: AC
  Administered 2018-07-11: 2 via ORAL

## 2018-07-11 NOTE — Patient Instructions (Addendum)
Kevin Castillo,  We saw that you are having pain on your left lower back. We want to see if you are having pain in your rib vs pain due to kidney stones. We are sending you for an X-ray of your back and ribs since you are also complaining of left sided lower leg weakness. We are also sending some pain medication and muscle relaxant to help alleviate your pain. Please let us know if you have trouble filling these scripts. We will give you a call once those test results are back and let you know how to proceed. Thank you for visiting the clinic.   Back Pain, Adult Many adults have back pain from time to time. Common causes of back pain include:  A strained muscle or ligament.  Wear and tear (degeneration) of the spinal disks.  Arthritis.  A hit to the back.  Back pain can be short-lived (acute) or last a long time (chronic). A physical exam, lab tests, and imaging studies may be done to find the cause of your pain. Follow these instructions at home: Managing pain and stiffness  Take over-the-counter and prescription medicines only as told by your health care provider.  If directed, apply heat to the affected area as often as told by your health care provider. Use the heat source that your health care provider recommends, such as a moist heat pack or a heating pad. ? Place a towel between your skin and the heat source. ? Leave the heat on for 20-30 minutes. ? Remove the heat if your skin turns bright red. This is especially important if you are unable to feel pain, heat, or cold. You have a greater risk of getting burned.  If directed, apply ice to the injured area: ? Put ice in a plastic bag. ? Place a towel between your skin and the bag. ? Leave the ice on for 20 minutes, 2-3 times a day for the first 2-3 days. Activity  Do not stay in bed. Resting more than 1-2 days can delay your recovery.  Take short walks on even surfaces as soon as you are able. Try to increase the length of time  you walk each day.  Do not sit, drive, or stand in one place for more than 30 minutes at a time. Sitting or standing for long periods of time can put stress on your back.  Use proper lifting techniques. When you bend and lift, use positions that put less stress on your back: ? Hutton your knees. ? Keep the load close to your body. ? Avoid twisting.  Exercise regularly as told by your health care provider. Exercising will help your back heal faster. This also helps prevent back injuries by keeping muscles strong and flexible.  Your health care provider may recommend that you see a physical therapist. This person can help you come up with a safe exercise program. Do any exercises as told by your physical therapist. Lifestyle  Maintain a healthy weight. Extra weight puts stress on your back and makes it difficult to have good posture.  Avoid activities or situations that make you feel anxious or stressed. Learn ways to manage anxiety and stress. One way to manage stress is through exercise. Stress and anxiety increase muscle tension and can make back pain worse. General instructions  Sleep on a firm mattress in a comfortable position. Try lying on your side with your knees slightly bent. If you lie on your back, put a pillow under your knees.  Follow your treatment plan as told by your health care provider. This may include: ? Cognitive or behavioral therapy. ? Acupuncture or massage therapy. ? Meditation or yoga. Contact a health care provider if:  You have pain that is not relieved with rest or medicine.  You have increasing pain going down into your legs or buttocks.  Your pain does not improve in 2 weeks.  You have pain at night.  You lose weight.  You have a fever or chills. Get help right away if:  You develop new bowel or bladder control problems.  You have unusual weakness or numbness in your arms or legs.  You develop nausea or vomiting.  You develop abdominal  pain.  You feel faint. Summary  Many adults have back pain from time to time. A physical exam, lab tests, and imaging studies may be done to find the cause of your pain.  Use proper lifting techniques. When you bend and lift, use positions that put less stress on your back.  Take over-the-counter and prescription medicines and apply heat or ice as directed by your health care provider. This information is not intended to replace advice given to you by your health care provider. Make sure you discuss any questions you have with your health care provider. Document Released: 12/05/2005 Document Revised: 01/09/2017 Document Reviewed: 01/09/2017 Elsevier Interactive Patient Education  [employer].

## 2018-07-11 NOTE — Progress Notes (Signed)
CC: Left lower back pain  HPI: [name] is a 72 y.o.M w/ PMH of CKD stage 4 due for graft for dialysis, T2DM, Anemia and Chronic systolic heart failure (EF 35%), and CAD s/p CABG x4 presenting to the clinic with complaints of acute back pain. He states the pain began about a week and a half ago without any inciting event or trauma and have been present since. He states the pain is worse with walking up and down the stairs or turning his torso and better with rest. He describes the pain as 8/10 sharp throbbing pain that is constant and is severe enough to interfere with ambulation and sleep. He states he does not recall ever having such pain before. Denies any urinary frequency, urgency, dysuria or frank blood in urine.  Past Medical History:  Diagnosis Date  . Acute left systolic heart failure ([place]) 01/06/2016  . Adenoma of left adrenal gland 06/16/2014   CT Abdomen (06/04/2014): Incidental, 20 mm, 8.33 HU suggesting benign adenoma   . Alcohol abuse 02/15/2007   Reports consuming 1 pint of gin on weekends, does not drink every day. Denies history of seizures, blackouts, or tremors.    . Allergic rhinitis 04/09/2014  . Anemia 11/12/2013   Unclear cause as of yet (? EtOH abuse)   . Anemia of chronic kidney failure, stage 4 (severe) ([place]) 07/28/2017  . Benign prostatic hypertrophy 06/02/2014  . Chronic kidney disease, stage 3 ([place]) 11/12/2013  . Chronic systolic heart failure ([place]) 01/15/2016   Echo (01/07/2016): LVEF 35%  . Coronary artery disease 02/15/2007   Non-STEMI 07/2005, s/p CABG x 4 (08/01/2005): LIMA to LAD, SVG to OM, RCA, PCA   . DVT (deep venous thrombosis) ([place]) 2006   RLE "when I had OHS"  . Erectile dysfunction associated with type 2 diabetes mellitus ([place]) 02/15/2007  . Essential hypertension 02/15/2007  . Hyperlipidemia 02/15/2007  . Major depression, single episode 11/02/2015  . Obesity (BMI 30.0-34.9) 02/15/2007  . Peripheral vascular occlusive disease ([facility]) 12/30/2013    ABI (01/03/2014): Right 0.64, Left 0.64   . Secondary hyperparathyroidism of renal origin ([place]) 07/28/2017  . Sigmoid diverticulosis 01/15/2016   Minimal per colonoscopy 01/11/2016  . Tobacco abuse 02/15/2007  . Tubular adenoma of colon 01/11/2016   Two 6 mm semi-pedunculated tubular adenomas excised endoscopically 01/11/2016.  Repeat colonoscopy due 12/2020.  . Type 2 diabetes mellitus with mild nonproliferative diabetic retinopathy with macular edema, bilateral ([place]) 10/28/2016  . Type 2 diabetes mellitus with stage 3 chronic kidney disease ([place]) 02/15/2007  . Venous thromboembolism 11/11/2013   LE Dopplers (01/2011): RLE DVT. CTA with mild subacute to chronic right-sided pulmonary emboli.  Patient has elected to continue anticoagulation.    Review of Systems: Review of Systems  Constitutional: Positive for malaise/fatigue. Negative for chills, fever and weight loss.  Gastrointestinal: Negative for constipation, diarrhea, nausea and vomiting.  Genitourinary: Positive for flank pain. Negative for dysuria, hematuria and urgency.  Musculoskeletal: Positive for back pain. Negative for falls, joint pain and neck pain.  Neurological: Negative for dizziness, tingling, sensory change, focal weakness and headaches.     Physical Exam: Vitals:   07/11/18 1500  BP: (!) 152/70  Pulse: 64  Temp: 98.5 F (36.9 C)  TempSrc: Oral  SpO2: 100%  Weight: 202 lb 12.8 oz (92 kg)  Height: 5\' 7"  (1.702 m)    Physical Exam  Constitutional: He is oriented to person, place, and time. He appears well-developed and well-nourished. He appears distressed.  Cardiovascular: Normal rate, regular rhythm, normal heart sounds and intact distal pulses.  Respiratory: Effort normal and breath sounds normal. No respiratory distress. He has no wheezes.  GI: Soft. Bowel sounds are normal. He exhibits distension. There is no tenderness. There is no guarding.  + hepatomegaly, negative splenomegaly  Musculoskeletal: Normal  range of motion. He exhibits tenderness (Tenderness to palpation of lower posterior ribs on left side. No spinal tenderness. ?CVA tenderness. No ecchymosis, erythema, or warmth). He exhibits no edema or deformity.  Neurological: He is alert and oriented to person, place, and time. He has normal reflexes. No cranial nerve deficit. Coordination normal.  Psychiatric: His behavior is normal. Judgment and thought content normal.  Flat affect    Assessment & Plan:   See Encounters Tab for problem based charting.  Patient seen with Dr. Beryle Beams   -Gilberto Better, PGY1

## 2018-07-12 ENCOUNTER — Telehealth: Payer: Self-pay | Admitting: Internal Medicine

## 2018-07-12 ENCOUNTER — Other Ambulatory Visit: Payer: Self-pay | Admitting: Internal Medicine

## 2018-07-12 ENCOUNTER — Encounter: Payer: Self-pay | Admitting: Internal Medicine

## 2018-07-12 ENCOUNTER — Telehealth: Payer: Self-pay | Admitting: *Deleted

## 2018-07-12 DIAGNOSIS — R0781 Pleurodynia: Secondary | ICD-10-CM | POA: Insufficient documentation

## 2018-07-12 LAB — CMP14 + ANION GAP
ALK PHOS: 109 IU/L (ref 39–117)
ALT: 28 IU/L (ref 0–44)
AST: 20 IU/L (ref 0–40)
Albumin/Globulin Ratio: 1.4 (ref 1.2–2.2)
Albumin: 4.3 g/dL (ref 3.5–4.8)
Anion Gap: 17 mmol/L (ref 10.0–18.0)
BILIRUBIN TOTAL: 0.4 mg/dL (ref 0.0–1.2)
BUN/Creatinine Ratio: 9 — ABNORMAL LOW (ref 10–24)
BUN: 56 mg/dL — AB (ref 8–27)
CHLORIDE: 106 mmol/L (ref 96–106)
CO2: 21 mmol/L (ref 20–29)
Calcium: 8.9 mg/dL (ref 8.6–10.2)
Creatinine, Ser: 6.31 mg/dL — ABNORMAL HIGH (ref 0.76–1.27)
GFR calc Af Amer: 9 mL/min/{1.73_m2} — ABNORMAL LOW (ref >59)
GFR calc non Af Amer: 8 mL/min/{1.73_m2} — ABNORMAL LOW (ref >59)
GLUCOSE: 98 mg/dL (ref 65–99)
Globulin, Total: 3 g/dL (ref 1.5–4.5)
Potassium: 4.3 mmol/L (ref 3.5–5.2)
Sodium: 144 mmol/L (ref 134–144)
Total Protein: 7.3 g/dL (ref 6.0–8.5)

## 2018-07-12 LAB — CBC
Hematocrit: 27.8 % — ABNORMAL LOW (ref 37.5–51.0)
Hemoglobin: 8.9 g/dL — ABNORMAL LOW (ref 13.0–17.7)
MCH: 28.2 pg (ref 26.6–33.0)
MCHC: 32 g/dL (ref 31.5–35.7)
MCV: 88 fL (ref 79–97)
Platelets: 122 10*3/uL — ABNORMAL LOW (ref 150–450)
RBC: 3.16 x10E6/uL — ABNORMAL LOW (ref 4.14–5.80)
RDW: 18.2 % — AB (ref 12.3–15.4)
WBC: 2.9 10*3/uL — ABNORMAL LOW (ref 3.4–10.8)

## 2018-07-12 LAB — URINALYSIS, ROUTINE W REFLEX MICROSCOPIC
BILIRUBIN UA: NEGATIVE
GLUCOSE, UA: NEGATIVE
KETONES UA: NEGATIVE
Leukocytes, UA: NEGATIVE
Nitrite, UA: NEGATIVE
Specific Gravity, UA: 1.009 (ref 1.005–1.030)
Urobilinogen, Ur: 0.2 mg/dL (ref 0.2–1.0)
pH, UA: 5.5 (ref 5.0–7.5)

## 2018-07-12 LAB — MICROSCOPIC EXAMINATION
Casts: NONE SEEN /lpf
Epithelial Cells (non renal): NONE SEEN /hpf (ref 0–10)

## 2018-07-12 MED ORDER — CYCLOBENZAPRINE HCL 5 MG PO TABS: 5.0000 mg | ORAL_TABLET | Freq: Three times a day (TID) | ORAL | 0 refills | Status: DC | PRN

## 2018-07-12 NOTE — Assessment & Plan Note (Addendum)
-   Patient states he was on EPO and Faraheme for his anemia 2/2 kidney failure - Baseline hemoglobin 10.0 - Patient states he have not been getting his EPO shots because his flank pain is preventing him from walking to the clinic  - Will repeat CBC today for monthly hgb check

## 2018-07-12 NOTE — Telephone Encounter (Signed)
Spoke with patient about results of his lab and X-rays. Patient states his pain is slightly improved with pain medication but it has not disappeared. Informed the patient about coming back in to the clinic for work-up for his pan-cytopenia, which he states he will do when he has time.

## 2018-07-12 NOTE — Telephone Encounter (Signed)
Per note from [place] yesterday Rx for muscle relaxer was to be sent to pharmacy. Wife called in stating it's not there. Requesting Rx be sent to [facility] on [address]. Hubbard Hartshorn, RN, BSN

## 2018-07-12 NOTE — Assessment & Plan Note (Addendum)
-   Patient with history of heavy alcohol use  - He states he has not had any drinks since 3 months ago - Physical exam finding include hepatomegaly  - Will repeat CMP today to assess liver function

## 2018-07-12 NOTE — Telephone Encounter (Signed)
Rx for flexeril has been sent by provider who saw patient yesterday. Message left on identified VM that Rx has been sent. Hubbard Hartshorn, RN, BSN

## 2018-07-12 NOTE — Assessment & Plan Note (Addendum)
-   Patient with history of stage 5 chronic kidney disease with anemia (baseline hgb of 10) - Scheduled for AV graft surgery on 07/23/18 for dialysis access - Currently on calcitriol for calcium supplement - Last renal panel on 06/28/18 by nephrologist show K,Ca,Phos in normal ranges  - Will repeat CMP today in light of acute musculoskeletal pain possibly 2/2 electrolyte imbalance

## 2018-07-12 NOTE — Progress Notes (Signed)
Received call about Flexeril being sent to wrong pharmacy. New order put in to local pharmacy.

## 2018-07-12 NOTE — Assessment & Plan Note (Signed)
-   Patient with severe, debilitating pain on his left lower back - No obvious inciting event or signs of trauma - Worsening with exertion and movement - Tenderness to palpation of posterior ribs on physical exam - Denies any urinary symptoms, but recounts 1 episode of left lower extremity weakness  Left lower back pain 2/2 musculoskeletal pain vs nephrolithiasis: Physical exam findings support MSK source for pain - X-rays of lumbar spine and ribs - Urinanalysis to r/o kidney stones - Start Flexeril 5mg  daily and short course of hydromorphone/acetaminophen for pain relief

## 2018-07-13 ENCOUNTER — Encounter: Payer: Self-pay | Admitting: Oncology

## 2018-07-13 ENCOUNTER — Ambulatory Visit: Payer: Medicare HMO

## 2018-07-13 ENCOUNTER — Telehealth: Payer: Self-pay | Admitting: Oncology

## 2018-07-13 NOTE — Telephone Encounter (Signed)
New referral received from Dr. Justin Mend at [facility] for dx of thrombocytopenia. Pt has been scheduled to see Mikey Bussing on 8/19 at 1pm. Pt aware to arrive 30 minutes early. Letter and directions mailed.

## 2018-07-13 NOTE — Progress Notes (Signed)
Medicine attending: I personally interviewed and briefly examined this patient on the day of the patient visit and reviewed pertinent clinical ,laboratory, and radiographic data  with resident physician Dr. Gilberto Better and we discussed a management plan.Likely paraspinal musculoskeletal pain. Tender on palpation over lower, posterior, left ribs. No hx of trauma or increased physical activity. We will get LS spine films & post rib films. Rx symptomatically w analgesics/anti-spasmodics. Large liver on exam. I could not feel an enlarged spleen. Hx of ETOH use. Liver function tests normal but unexplained pancytopenia w chronic anmia, worsening leukopenia, new thrombocytopenia C/W 2017 valuse. Needs further eval. Will start w repeat CBC with WBC differential & smear; US abdomen to assess for splenomegaly/portal HTN.

## 2018-07-18 ENCOUNTER — Encounter (HOSPITAL_COMMUNITY): Payer: Self-pay | Admitting: General Practice

## 2018-07-18 ENCOUNTER — Inpatient Hospital Stay (HOSPITAL_COMMUNITY): Payer: Medicare HMO

## 2018-07-18 ENCOUNTER — Other Ambulatory Visit: Payer: Self-pay

## 2018-07-18 ENCOUNTER — Inpatient Hospital Stay (HOSPITAL_COMMUNITY)
Admission: AD | Admit: 2018-07-18 | Discharge: 2018-07-26 | DRG: 264 | Disposition: A | Discharge status: Home / Self Care | Payer: Medicare HMO | Source: Ambulatory Visit | Attending: Cardiovascular Disease | Admitting: Cardiovascular Disease

## 2018-07-18 ENCOUNTER — Ambulatory Visit: Payer: Medicare HMO

## 2018-07-18 DIAGNOSIS — Z7951 Long term (current) use of inhaled steroids: Secondary | ICD-10-CM

## 2018-07-18 DIAGNOSIS — E785 Hyperlipidemia, unspecified: Secondary | ICD-10-CM | POA: Diagnosis present

## 2018-07-18 DIAGNOSIS — I361 Nonrheumatic tricuspid (valve) insufficiency: Secondary | ICD-10-CM | POA: Diagnosis not present

## 2018-07-18 DIAGNOSIS — Z9841 Cataract extraction status, right eye: Secondary | ICD-10-CM | POA: Diagnosis not present

## 2018-07-18 DIAGNOSIS — E790 Hyperuricemia without signs of inflammatory arthritis and tophaceous disease: Secondary | ICD-10-CM | POA: Diagnosis not present

## 2018-07-18 DIAGNOSIS — Z833 Family history of diabetes mellitus: Secondary | ICD-10-CM | POA: Diagnosis not present

## 2018-07-18 DIAGNOSIS — E8770 Fluid overload, unspecified: Secondary | ICD-10-CM | POA: Diagnosis not present

## 2018-07-18 DIAGNOSIS — Z86718 Personal history of other venous thrombosis and embolism: Secondary | ICD-10-CM

## 2018-07-18 DIAGNOSIS — J309 Allergic rhinitis, unspecified: Secondary | ICD-10-CM | POA: Diagnosis present

## 2018-07-18 DIAGNOSIS — E113219 Type 2 diabetes mellitus with mild nonproliferative diabetic retinopathy with macular edema, unspecified eye: Secondary | ICD-10-CM | POA: Diagnosis present

## 2018-07-18 DIAGNOSIS — R0602 Shortness of breath: Secondary | ICD-10-CM | POA: Diagnosis present

## 2018-07-18 DIAGNOSIS — K5900 Constipation, unspecified: Secondary | ICD-10-CM | POA: Diagnosis not present

## 2018-07-18 DIAGNOSIS — J449 Chronic obstructive pulmonary disease, unspecified: Secondary | ICD-10-CM | POA: Diagnosis not present

## 2018-07-18 DIAGNOSIS — K5730 Diverticulosis of large intestine without perforation or abscess without bleeding: Secondary | ICD-10-CM | POA: Diagnosis present

## 2018-07-18 DIAGNOSIS — Z992 Dependence on renal dialysis: Secondary | ICD-10-CM

## 2018-07-18 DIAGNOSIS — I5023 Acute on chronic systolic (congestive) heart failure: Secondary | ICD-10-CM | POA: Diagnosis not present

## 2018-07-18 DIAGNOSIS — D631 Anemia in chronic kidney disease: Secondary | ICD-10-CM | POA: Diagnosis not present

## 2018-07-18 DIAGNOSIS — E1140 Type 2 diabetes mellitus with diabetic neuropathy, unspecified: Secondary | ICD-10-CM | POA: Diagnosis not present

## 2018-07-18 DIAGNOSIS — E66811 Obesity, class 1: Secondary | ICD-10-CM | POA: Diagnosis present

## 2018-07-18 DIAGNOSIS — Z419 Encounter for procedure for purposes other than remedying health state, unspecified: Secondary | ICD-10-CM

## 2018-07-18 DIAGNOSIS — N186 End stage renal disease: Secondary | ICD-10-CM | POA: Diagnosis not present

## 2018-07-18 DIAGNOSIS — E669 Obesity, unspecified: Secondary | ICD-10-CM | POA: Diagnosis present

## 2018-07-18 DIAGNOSIS — Z888 Allergy status to other drugs, medicaments and biological substances status: Secondary | ICD-10-CM | POA: Diagnosis not present

## 2018-07-18 DIAGNOSIS — Z9842 Cataract extraction status, left eye: Secondary | ICD-10-CM | POA: Diagnosis not present

## 2018-07-18 DIAGNOSIS — Z961 Presence of intraocular lens: Secondary | ICD-10-CM | POA: Diagnosis present

## 2018-07-18 DIAGNOSIS — Z8249 Family history of ischemic heart disease and other diseases of the circulatory system: Secondary | ICD-10-CM

## 2018-07-18 DIAGNOSIS — F17210 Nicotine dependence, cigarettes, uncomplicated: Secondary | ICD-10-CM | POA: Diagnosis present

## 2018-07-18 DIAGNOSIS — Z825 Family history of asthma and other chronic lower respiratory diseases: Secondary | ICD-10-CM

## 2018-07-18 DIAGNOSIS — D500 Iron deficiency anemia secondary to blood loss (chronic): Secondary | ICD-10-CM | POA: Diagnosis present

## 2018-07-18 DIAGNOSIS — N185 Chronic kidney disease, stage 5: Secondary | ICD-10-CM

## 2018-07-18 DIAGNOSIS — I5022 Chronic systolic (congestive) heart failure: Secondary | ICD-10-CM | POA: Diagnosis present

## 2018-07-18 DIAGNOSIS — Z951 Presence of aortocoronary bypass graft: Secondary | ICD-10-CM | POA: Diagnosis not present

## 2018-07-18 DIAGNOSIS — I132 Hypertensive heart and chronic kidney disease with heart failure and with stage 5 chronic kidney disease, or end stage renal disease: Secondary | ICD-10-CM | POA: Diagnosis not present

## 2018-07-18 DIAGNOSIS — I340 Nonrheumatic mitral (valve) insufficiency: Secondary | ICD-10-CM | POA: Diagnosis not present

## 2018-07-18 DIAGNOSIS — I2510 Atherosclerotic heart disease of native coronary artery without angina pectoris: Secondary | ICD-10-CM | POA: Diagnosis present

## 2018-07-18 DIAGNOSIS — I252 Old myocardial infarction: Secondary | ICD-10-CM | POA: Diagnosis not present

## 2018-07-18 DIAGNOSIS — I70208 Unspecified atherosclerosis of native arteries of extremities, other extremity: Secondary | ICD-10-CM | POA: Diagnosis present

## 2018-07-18 DIAGNOSIS — R109 Unspecified abdominal pain: Secondary | ICD-10-CM

## 2018-07-18 DIAGNOSIS — I509 Heart failure, unspecified: Secondary | ICD-10-CM | POA: Diagnosis not present

## 2018-07-18 DIAGNOSIS — F1010 Alcohol abuse, uncomplicated: Secondary | ICD-10-CM | POA: Diagnosis present

## 2018-07-18 DIAGNOSIS — Z720 Tobacco use: Secondary | ICD-10-CM | POA: Diagnosis present

## 2018-07-18 DIAGNOSIS — E1122 Type 2 diabetes mellitus with diabetic chronic kidney disease: Secondary | ICD-10-CM | POA: Diagnosis not present

## 2018-07-18 DIAGNOSIS — I517 Cardiomegaly: Secondary | ICD-10-CM | POA: Diagnosis not present

## 2018-07-18 DIAGNOSIS — E113213 Type 2 diabetes mellitus with mild nonproliferative diabetic retinopathy with macular edema, bilateral: Secondary | ICD-10-CM | POA: Diagnosis present

## 2018-07-18 DIAGNOSIS — N2581 Secondary hyperparathyroidism of renal origin: Secondary | ICD-10-CM | POA: Diagnosis present

## 2018-07-18 DIAGNOSIS — I10 Essential (primary) hypertension: Secondary | ICD-10-CM | POA: Diagnosis present

## 2018-07-18 HISTORY — DX: Chronic obstructive pulmonary disease, unspecified: J44.9

## 2018-07-18 HISTORY — DX: Pneumonia, unspecified organism: J18.9

## 2018-07-18 LAB — COMPREHENSIVE METABOLIC PANEL
ALBUMIN: 3.8 g/dL (ref 3.5–5.0)
ALK PHOS: 104 U/L (ref 38–126)
ALT: 21 U/L (ref 0–44)
ANION GAP: 12 (ref 5–15)
AST: 20 U/L (ref 15–41)
BILIRUBIN TOTAL: 0.7 mg/dL (ref 0.3–1.2)
BUN: 77 mg/dL — ABNORMAL HIGH (ref 8–23)
CALCIUM: 8.8 mg/dL — AB (ref 8.9–10.3)
CO2: 23 mmol/L (ref 22–32)
Chloride: 105 mmol/L (ref 98–111)
Creatinine, Ser: 7.41 mg/dL — ABNORMAL HIGH (ref 0.61–1.24)
GFR calc non Af Amer: 6 mL/min — ABNORMAL LOW (ref >60)
GFR, EST AFRICAN AMERICAN: 8 mL/min — AB (ref >60)
GLUCOSE: 96 mg/dL (ref 70–99)
POTASSIUM: 4.4 mmol/L (ref 3.5–5.1)
SODIUM: 140 mmol/L (ref 135–145)
TOTAL PROTEIN: 7.9 g/dL (ref 6.5–8.1)

## 2018-07-18 LAB — CBC WITH DIFFERENTIAL/PLATELET
Abs Immature Granulocytes: 0 10*3/uL (ref 0.0–0.1)
BASOS ABS: 0 10*3/uL (ref 0.0–0.1)
BASOS PCT: 0 %
Eosinophils Absolute: 0 10*3/uL (ref 0.0–0.7)
Eosinophils Relative: 1 %
HCT: 28.4 % — ABNORMAL LOW (ref 39.0–52.0)
Hemoglobin: 8.6 g/dL — ABNORMAL LOW (ref 13.0–17.0)
IMMATURE GRANULOCYTES: 0 %
LYMPHS PCT: 29 %
Lymphs Abs: 0.9 10*3/uL (ref 0.7–4.0)
MCH: 28.1 pg (ref 26.0–34.0)
MCHC: 30.3 g/dL (ref 30.0–36.0)
MCV: 92.8 fL (ref 78.0–100.0)
MONO ABS: 0.3 10*3/uL (ref 0.1–1.0)
Monocytes Relative: 8 %
NEUTROS ABS: 2 10*3/uL (ref 1.7–7.7)
NEUTROS PCT: 62 %
Platelets: 154 10*3/uL (ref 150–400)
RBC: 3.06 MIL/uL — AB (ref 4.22–5.81)
RDW: 16.8 % — ABNORMAL HIGH (ref 11.5–15.5)
WBC: 3.2 10*3/uL — ABNORMAL LOW (ref 4.0–10.5)

## 2018-07-18 LAB — BRAIN NATRIURETIC PEPTIDE: B Natriuretic Peptide: 960.9 pg/mL — ABNORMAL HIGH (ref 0.0–100.0)

## 2018-07-18 LAB — TSH: TSH: 2.379 u[IU]/mL (ref 0.350–4.500)

## 2018-07-18 LAB — HEMOGLOBIN A1C
HEMOGLOBIN A1C: 5.7 % — AB (ref 4.8–5.6)
Mean Plasma Glucose: 116.89 mg/dL

## 2018-07-18 LAB — TROPONIN I: Troponin I: 0.03 ng/mL (ref <0.03)

## 2018-07-18 MED ORDER — ATORVASTATIN CALCIUM 20 MG PO TABS
20.0000 mg | ORAL_TABLET | Freq: Every day | ORAL | Status: DC
  Administered 2018-07-19 – 2018-07-26 (×8): 20 mg via ORAL
  Filled 2018-07-18 (×9): qty 1

## 2018-07-18 MED ORDER — CYCLOBENZAPRINE HCL 10 MG PO TABS: 5.0000 mg | ORAL_TABLET | Freq: Three times a day (TID) | ORAL | Status: DC | PRN

## 2018-07-18 MED ORDER — ALBUTEROL SULFATE (2.5 MG/3ML) 0.083% IN NEBU: 3.0000 mL | INHALATION_SOLUTION | Freq: Four times a day (QID) | RESPIRATORY_TRACT | Status: DC | PRN

## 2018-07-18 MED ORDER — HEPARIN SODIUM (PORCINE) 5000 UNIT/ML IJ SOLN
5000.0000 [IU] | Freq: Three times a day (TID) | INTRAMUSCULAR | Status: DC
  Administered 2018-07-18 – 2018-07-26 (×21): 5000 [IU] via SUBCUTANEOUS
  Filled 2018-07-18 (×21): qty 1

## 2018-07-18 MED ORDER — LABETALOL HCL 200 MG PO TABS
200.0000 mg | ORAL_TABLET | Freq: Two times a day (BID) | ORAL | Status: DC
  Administered 2018-07-18 – 2018-07-25 (×15): 200 mg via ORAL
  Filled 2018-07-18 (×18): qty 1

## 2018-07-18 MED ORDER — FUROSEMIDE 10 MG/ML IJ SOLN
80.0000 mg | Freq: Two times a day (BID) | INTRAMUSCULAR | Status: DC
  Administered 2018-07-18 – 2018-07-20 (×5): 80 mg via INTRAVENOUS
  Filled 2018-07-18 (×5): qty 8

## 2018-07-18 MED ORDER — TERAZOSIN HCL 5 MG PO CAPS
10.0000 mg | ORAL_CAPSULE | Freq: Every day | ORAL | Status: DC
Start: 2018-07-18 — End: 2018-07-26
  Administered 2018-07-18 – 2018-07-25 (×8): 10 mg via ORAL
  Filled 2018-07-18 (×9): qty 2

## 2018-07-18 MED ORDER — SODIUM CHLORIDE 0.9% FLUSH
3.0000 mL | Freq: Two times a day (BID) | INTRAVENOUS | Status: DC
  Administered 2018-07-18 – 2018-07-19 (×3): 3 mL via INTRAVENOUS
  Administered 2018-07-20: 10 mL via INTRAVENOUS
  Administered 2018-07-20 – 2018-07-26 (×10): 3 mL via INTRAVENOUS

## 2018-07-18 MED ORDER — SODIUM CHLORIDE 0.9 % IV SOLN: 250.0000 mL | INTRAVENOUS | Status: DC | PRN

## 2018-07-18 MED ORDER — FLUTICASONE PROPIONATE 50 MCG/ACT NA SUSP
1.0000 | NASAL | Status: DC | PRN
Start: 2018-07-18 — End: 2018-07-26
  Filled 2018-07-18: qty 16

## 2018-07-18 MED ORDER — HYDROCODONE-ACETAMINOPHEN 5-325 MG PO TABS
1.0000 | ORAL_TABLET | Freq: Four times a day (QID) | ORAL | Status: DC | PRN
  Administered 2018-07-18 – 2018-07-19 (×3): 2 via ORAL
  Administered 2018-07-20: 1 via ORAL
  Administered 2018-07-20 – 2018-07-26 (×13): 2 via ORAL
  Filled 2018-07-18 (×12): qty 2
  Filled 2018-07-18: qty 1
  Filled 2018-07-18 (×4): qty 2

## 2018-07-18 MED ORDER — FINASTERIDE 5 MG PO TABS
5.0000 mg | ORAL_TABLET | Freq: Every day | ORAL | Status: DC
  Administered 2018-07-19 – 2018-07-26 (×8): 5 mg via ORAL
  Filled 2018-07-18 (×9): qty 1

## 2018-07-18 MED ORDER — SODIUM CHLORIDE 0.9% FLUSH: 3.0000 mL | INTRAVENOUS | Status: DC | PRN

## 2018-07-18 MED ORDER — ADULT MULTIVITAMIN W/MINERALS CH
1.0000 | ORAL_TABLET | Freq: Every morning | ORAL | Status: DC
  Administered 2018-07-19 – 2018-07-26 (×8): 1 via ORAL
  Filled 2018-07-18 (×10): qty 1

## 2018-07-18 MED ORDER — CALCITRIOL 0.5 MCG PO CAPS
0.5000 ug | ORAL_CAPSULE | Freq: Every day | ORAL | Status: DC
Start: 2018-07-18 — End: 2018-07-26
  Administered 2018-07-19 – 2018-07-26 (×8): 0.5 ug via ORAL
  Filled 2018-07-18 (×9): qty 1

## 2018-07-18 NOTE — Progress Notes (Signed)
Dr. Doylene Canard notified about patient's arrival. Patient in NAD, VS stable and patient free from pain. Wife at bedside.

## 2018-07-18 NOTE — H&P (Signed)
Referring Physician:  [name] is an 72 y.o. male.                       Chief Complaint: Shortness of breath and leg edema.  HPI: 72 year old male with 3 month history of progressive worsening of swelling of both feet with high dose amlodipine use has shortness of breath on activity, He has PMH of chronic systolic left heart failure, Anemia of GI blood loss and chronic disease, CKD, stage IV, COPD, CAD, CABG, hypertension, Type 2 DM, tobacco use disorder, alcohol use disorder and obesity.   Past Medical History:  Diagnosis Date  . Acute left systolic heart failure ([place]) 01/06/2016  . Adenoma of left adrenal gland 06/16/2014   CT Abdomen (06/04/2014): Incidental, 20 mm, 8.33 HU suggesting benign adenoma   . Alcohol abuse 02/15/2007   Reports consuming 1 pint of gin on weekends, does not drink every day. Denies history of seizures, blackouts, or tremors.    . Allergic rhinitis 04/09/2014  . Anemia 11/12/2013   Unclear cause as of yet (? EtOH abuse)   . Anemia of chronic kidney failure, stage 4 (severe) ([place]) 07/28/2017  . Benign prostatic hypertrophy 06/02/2014  . Chronic kidney disease, stage 3 ([place]) 11/12/2013  . Chronic systolic heart failure ([place]) 01/15/2016   Echo (01/07/2016): LVEF 35%  . COPD (chronic obstructive pulmonary disease) ([place])   . Coronary artery disease 02/15/2007   Non-STEMI 07/2005, s/p CABG x 4 (08/01/2005): LIMA to LAD, SVG to OM, RCA, PCA   . DVT (deep venous thrombosis) ([place]) 2006   RLE "when I had OHS"  . Erectile dysfunction associated with type 2 diabetes mellitus ([place]) 02/15/2007  . Essential hypertension 02/15/2007  . Hyperlipidemia 02/15/2007  . Major depression, single episode 11/02/2015  . Obesity (BMI 30.0-34.9) 02/15/2007  . Peripheral vascular occlusive disease ([place]) 12/30/2013   ABI (01/03/2014): Right 0.64, Left 0.64   . Pneumonia X 1  . Secondary hyperparathyroidism of renal origin ([place]) 07/28/2017  . Sigmoid diverticulosis 01/15/2016   Minimal per  colonoscopy 01/11/2016  . Tobacco abuse 02/15/2007  . Tubular adenoma of colon 01/11/2016   Two 6 mm semi-pedunculated tubular adenomas excised endoscopically 01/11/2016.  Repeat colonoscopy due 12/2020.  . Type 2 diabetes mellitus with mild nonproliferative diabetic retinopathy with macular edema, bilateral ([place]) 10/28/2016  . Type 2 diabetes mellitus with stage 3 chronic kidney disease ([place]) 02/15/2007  . Venous thromboembolism 11/11/2013   LE Dopplers (01/2011): RLE DVT. CTA with mild subacute to chronic right-sided pulmonary emboli.  Patient has elected to continue anticoagulation.       Past Surgical History:  Procedure Laterality Date  . CARDIAC CATHETERIZATION  07/2005  . CATARACT EXTRACTION W/ INTRAOCULAR LENS  IMPLANT, BILATERAL Bilateral 04/2014   Left 04/30/2014, Right 05/07/2014  . COLONOSCOPY WITH PROPOFOL N/A 01/11/2016   Procedure: COLONOSCOPY WITH PROPOFOL;  Surgeon: Ronald Lobo, MD;  Location: [facility];  Service: Endoscopy;  Laterality: N/A;  Needs MAC.  Marland Kitchen CORONARY ARTERY BYPASS GRAFT  08/01/2005   x 4, LIMA to LAD, SVG to OM, RCA, PCA  . ESOPHAGOGASTRODUODENOSCOPY N/A 01/08/2016   Procedure: ESOPHAGOGASTRODUODENOSCOPY (EGD);  Surgeon: Wilford Corner, MD;  Location[facility] ENDOSCOPY;  Service: Endoscopy;  Laterality: N/A;  . GIVENS CAPSULE STUDY N/A 01/12/2016   Procedure: GIVENS CAPSULE STUDY;  Surgeon: Ronald Lobo, MD;  Location: [facility];  Service: Endoscopy;  Laterality: N/A;    Family History  Problem Relation Age of Onset  .  Heart disease Father   . COPD Father   . Diabetes Mellitus II Mother   . Healthy Sister   . Healthy Brother   . Healthy Daughter   . Healthy Son   . Healthy Sister   . Healthy Sister   . Healthy Sister   . Healthy Sister   . Healthy Daughter   . Healthy Son   . Healthy Son   . Healthy Son   . Healthy Son   . Healthy Son   . Healthy Son    Social History:  reports that he has been smoking cigarettes.  He has a 18.00 pack-year  smoking history. He has quit using smokeless tobacco. His smokeless tobacco use included snuff and chew. He reports that he drinks about 3.0 oz of alcohol per week. He reports that he does not use drugs.  Allergies:  Allergies  Allergen Reactions  . Nicotine Transdermal System [Nicotine] Other (See Comments)    Vivid dreams    Medications Prior to Admission  Medication Sig Dispense Refill  . albuterol (PROVENTIL HFA;VENTOLIN HFA) 108 (90 Base) MCG/ACT inhaler Inhale 1 puff into the lungs every 6 (six) hours as needed for wheezing or shortness of breath.    Marland Kitchen amLODipine (NORVASC) 10 MG tablet Take 1 tablet (10 mg total) by mouth daily. 90 tablet 3  . atorvastatin (LIPITOR) 20 MG tablet Take 1 tablet (20 mg total) by mouth daily. 90 tablet 3  . calcitRIOL (ROCALTROL) 0.5 MCG capsule Take 0.5 mcg by mouth daily.    . cyclobenzaprine (FLEXERIL) 5 MG tablet Take 1 tablet (5 mg total) by mouth 3 (three) times daily as needed for muscle spasms. 30 tablet 0  . finasteride (PROSCAR) 5 MG tablet Take 1 tablet (5 mg total) by mouth daily. 30 tablet 11  . fluticasone (FLONASE) 50 MCG/ACT nasal spray Place 1 spray into both nostrils as needed for allergies or rhinitis.    . furosemide (LASIX) 80 MG tablet Take 1 tablet (80 mg total) by mouth daily. (Patient taking differently: Take 160 mg by mouth 2 (two) times daily. ) 30 tablet 3  . HYDROcodone-acetaminophen (NORCO/VICODIN) 5-325 MG tablet Take 1-2 tablets by mouth every 6 (six) hours as needed for up to 7 days for moderate pain. 30 tablet 0  . labetalol (NORMODYNE) 200 MG tablet Take 200 mg by mouth 2 (two) times daily.    . Multiple Vitamins-Minerals (MULTIVITAMIN ADULT) TABS Take 1 tablet by mouth every morning. 90 tablet 3  . terazosin (HYTRIN) 10 MG capsule Take 1 capsule (10 mg total) by mouth at bedtime. 90 capsule 3    No results found for this or any previous visit (from the past 48 hour(s)). No results found.  Review Of  Systems Constitutional: No fever, chills, Positive weight gain. Eyes: No vision change, wears glasses. No discharge or pain. Ears: No hearing loss, No tinnitus. Respiratory: No asthma, positive COPD, pneumonias, shortness of breath. No hemoptysis. Cardiovascular: Positive chest pain, palpitation, leg edema. Gastrointestinal: No nausea, vomiting, diarrhea, constipation. Positive GI bleed. No hepatitis. Genitourinary: No dysuria, hematuria, kidney stone. No incontinance. Neurological: No headache, stroke, seizures.  Psychiatry: No psych facility admission for anxiety, depression, suicide. No detox. Skin: No rash. Musculoskeletal: Positive joint pain, no fibromyalgia. Positive neck pain, back pain. Lymphadenopathy: No lymphadenopathy. Hematology: Positive anemia. No easy bruising.   Blood pressure (!) 171/80, temperature 97.6 F (36.4 C), temperature source Oral, resp. rate 18, height _0  (1.702 m), weight 90.5 kg (199 lb 8  oz), SpO2 98 %. Body mass index is 31.25 kg/m. General appearance: alert, cooperative, appears stated age and mild respiratory distress Head: Normocephalic, atraumatic. Eyes: Brown eyes, pink conjunctiva, corneas clear. PERRL, EOM's intact. Neck: No adenopathy, no carotid bruit, positive JVD, supple, symmetrical, trachea midline and thyroid not enlarged. Resp: Crackles to auscultation bilaterally. Cardio: Regular rate and rhythm, S1, S2 normal, III/VI systolic murmur, no click, rub or gallop GI: Soft, non-tender; bowel sounds normal; no organomegaly. Extremities: 4 + edema both lower legs, no cyanosis or clubbing. Skin: Warm and dry.  Neurologic: Alert and oriented X 3, normal strength. Normal coordination and slow gait.  Assessment/Plan Acute on chronic left heart systolic failure CAD CABG COPD Type 2 DM CKD, IV Chronic anemia, blood loss, (GI, chronic) and chronic disease Tobacco use disorder Alcohol use disorder  Admit. IV lasix R/O MI Blood  work.  Birdie Riddle, MD  07/18/2018, 5:15 PM

## 2018-07-19 ENCOUNTER — Inpatient Hospital Stay (HOSPITAL_COMMUNITY): Payer: Medicare HMO

## 2018-07-19 LAB — BASIC METABOLIC PANEL
Anion gap: 12 (ref 5–15)
BUN: 77 mg/dL — AB (ref 8–23)
CO2: 25 mmol/L (ref 22–32)
Calcium: 8.6 mg/dL — ABNORMAL LOW (ref 8.9–10.3)
Chloride: 105 mmol/L (ref 98–111)
Creatinine, Ser: 7.41 mg/dL — ABNORMAL HIGH (ref 0.61–1.24)
GFR calc Af Amer: 8 mL/min — ABNORMAL LOW (ref >60)
GFR calc non Af Amer: 6 mL/min — ABNORMAL LOW (ref >60)
GLUCOSE: 100 mg/dL — AB (ref 70–99)
POTASSIUM: 4.8 mmol/L (ref 3.5–5.1)
Sodium: 142 mmol/L (ref 135–145)

## 2018-07-19 LAB — CBC
HEMATOCRIT: 25.5 % — AB (ref 39.0–52.0)
Hemoglobin: 7.8 g/dL — ABNORMAL LOW (ref 13.0–17.0)
MCH: 28.7 pg (ref 26.0–34.0)
MCHC: 30.6 g/dL (ref 30.0–36.0)
MCV: 93.8 fL (ref 78.0–100.0)
Platelets: 156 10*3/uL (ref 150–400)
RBC: 2.72 MIL/uL — ABNORMAL LOW (ref 4.22–5.81)
RDW: 16.9 % — AB (ref 11.5–15.5)
WBC: 3 10*3/uL — ABNORMAL LOW (ref 4.0–10.5)

## 2018-07-19 LAB — IRON AND TIBC
IRON: 56 ug/dL (ref 45–182)
SATURATION RATIOS: 18 % (ref 17.9–39.5)
TIBC: 315 ug/dL (ref 250–450)
UIBC: 259 ug/dL

## 2018-07-19 LAB — TROPONIN I
Troponin I: 0.03 ng/mL (ref <0.03)
Troponin I: 0.03 ng/mL (ref <0.03)

## 2018-07-19 LAB — ECHOCARDIOGRAM COMPLETE
Height: 67 in
WEIGHTICAEL: 3188.8 [oz_av]

## 2018-07-19 LAB — PROTIME-INR
INR: 1.18
Prothrombin Time: 14.9 seconds (ref 11.4–15.2)

## 2018-07-19 LAB — FERRITIN: FERRITIN: 159 ng/mL (ref 24–336)

## 2018-07-19 NOTE — Progress Notes (Signed)
Ref: Oval Linsey, MD   Subjective:  Shortness of breath continues.  Echocardiogram shows moderate LV systolic dysfunction, moderate MR, TR and moderate to severely dilated LA and RA. Also has mild AS with moderate calcification.  Troponin I minimally elevated. BNP was 960.9 pg/mL.  Objective:  Vital Signs in the last 24 hours: Temp:  [97.4 F (36.3 C)-97.7 F (36.5 C)] 97.4 F (36.3 C) (08/01 1357) Pulse Rate:  [60-71] 60 (08/01 1357) Cardiac Rhythm: Normal sinus rhythm;Heart block (08/01 0700) Resp:  [17-21] 21 (08/01 0542) BP: (128-163)/(68-85) 162/69 (08/01 1725) SpO2:  [85 %-100 %] 100 % (08/01 1357) Weight:  [90.4 kg (199 lb 4.8 oz)] 90.4 kg (199 lb 4.8 oz) (08/01 0542)  Physical Exam: BP Readings from Last 1 Encounters:  07/19/18 (!) 162/69     Wt Readings from Last 1 Encounters:  07/19/18 90.4 kg (199 lb 4.8 oz)    Weight change:  Body mass index is 31.21 kg/m. HEENT: [place]/AT, Eyes-Brown, PERL, EOMI, Conjunctiva-Pink, Sclera-Non-icteric Neck: + JVD, No bruit, Trachea midline. Lungs:  Clearing, Bilateral. Cardiac:  Regular rhythm, normal S1 and S2, no S3. III/VI systolic murmur. Abdomen:  Soft, non-tender. BS present. Extremities:  2 + edema present. No cyanosis. No clubbing. CNS: AxOx3, Cranial nerves grossly intact, moves all 4 extremities.  Skin: Warm and dry.   Intake/Output from previous day: 07/31 0701 - 08/01 0700 In: 240 [P.O.:240] Out: 1100 [Urine:1100]    Lab Results: BMET    Component Value Date/Time   NA 142 07/19/2018 0443   NA 140 07/18/2018 1720   NA 144 07/11/2018 1637   NA 143 05/25/2017 1205   NA 137 09/21/2016 0936   NA 142 08/05/2016 1115   K 4.8 07/19/2018 0443   K 4.4 07/18/2018 1720   K 4.3 07/11/2018 1637   CL 105 07/19/2018 0443   CL 105 07/18/2018 1720   CL 106 07/11/2018 1637   CO2 25 07/19/2018 0443   CO2 23 07/18/2018 1720   CO2 21 07/11/2018 1637   GLUCOSE 100 (H) 07/19/2018 0443   GLUCOSE 96 07/18/2018 1720   GLUCOSE 98 07/11/2018 1637   GLUCOSE 126 (H) 05/25/2017 1205   GLUCOSE 203 (H) 09/21/2016 0936   BUN 77 (H) 07/19/2018 0443   BUN 77 (H) 07/18/2018 1720   BUN 56 (H) 07/11/2018 1637   BUN 37 (H) 05/25/2017 1205   BUN 30 (H) 09/21/2016 0936   BUN 25 08/05/2016 1115   CREATININE 7.41 (H) 07/19/2018 0443   CREATININE 7.41 (H) 07/18/2018 1720   CREATININE 6.31 (H) 07/11/2018 1637   CREATININE 1.54 (H) 05/28/2014 1046   CREATININE 1.43 (H) 03/05/2014 1532   CREATININE 1.47 (H) 12/30/2013 1532   CALCIUM 8.6 (L) 07/19/2018 0443   CALCIUM 8.8 (L) 07/18/2018 1720   CALCIUM 8.9 07/11/2018 1637   GFRNONAA 6 (L) 07/19/2018 0443   GFRNONAA 6 (L) 07/18/2018 1720   GFRNONAA 8 (L) 07/11/2018 1637   GFRNONAA 46 (L) 05/28/2014 1046   GFRNONAA 50 (L) 03/05/2014 1532   GFRNONAA 55 (L) 11/18/2013 1101   GFRAA 8 (L) 07/19/2018 0443   GFRAA 8 (L) 07/18/2018 1720   GFRAA 9 (L) 07/11/2018 1637   GFRAA 53 (L) 05/28/2014 1046   GFRAA 58 (L) 03/05/2014 1532   GFRAA 64 11/18/2013 1101   CBC    Component Value Date/Time   WBC 3.0 (L) 07/19/2018 0443   RBC 2.72 (L) 07/19/2018 0443   HGB 7.8 (L) 07/19/2018 0443   HGB 8.9 (L)  07/11/2018 1637   HCT 25.5 (L) 07/19/2018 0443   HCT 27.8 (L) 07/11/2018 1637   PLT 156 07/19/2018 0443   PLT 122 (L) 07/11/2018 1637   MCV 93.8 07/19/2018 0443   MCV 88 07/11/2018 1637   MCH 28.7 07/19/2018 0443   MCHC 30.6 07/19/2018 0443   RDW 16.9 (H) 07/19/2018 0443   RDW 18.2 (H) 07/11/2018 1637   LYMPHSABS 0.9 07/18/2018 1720   LYMPHSABS 1.4 08/05/2016 1115   MONOABS 0.3 07/18/2018 1720   EOSABS 0.0 07/18/2018 1720   EOSABS 0.0 08/05/2016 1115   BASOSABS 0.0 07/18/2018 1720   BASOSABS 0.0 08/05/2016 1115   HEPATIC Function Panel Recent Labs    07/11/18 1637 07/18/18 1720  PROT 7.3 7.9   HEMOGLOBIN A1C No components found for: HGA1C,  MPG CARDIAC ENZYMES Lab Results  Component Value Date   CKTOTAL 64 05/26/2009   CKMB 0.7 05/26/2009   TROPONINI 0.03  (HH) 07/19/2018   TROPONINI 0.03 (HH) 07/18/2018   TROPONINI 0.03 (HH) 07/18/2018   BNP No results for input(s): PROBNP in the last 8760 hours. TSH Recent Labs    07/18/18 1720  TSH 2.379   CHOLESTEROL No results for input(s): CHOL in the last 8760 hours.  Scheduled Meds: . atorvastatin  20 mg Oral Daily  . calcitRIOL  0.5 mcg Oral Daily  . finasteride  5 mg Oral Daily  . furosemide  80 mg Intravenous Q12H  . heparin  5,000 Units Subcutaneous Q8H  . labetalol  200 mg Oral BID  . multivitamin with minerals  1 tablet Oral q morning - 10a  . sodium chloride flush  3 mL Intravenous Q12H  . terazosin  10 mg Oral QHS   Continuous Infusions: . sodium chloride     PRN Meds:.sodium chloride, albuterol, cyclobenzaprine, fluticasone, HYDROcodone-acetaminophen, sodium chloride flush  Assessment/Plan: Acute on chronic left heart systolic failure CAD CABG COPD Type 2 DM ESRD Chronic anemia, chronic blood loss and chronic disease Tobacco use disorder Alcohol use disorder  Continue diuresis.   LOS: 1 day    Dixie Dials  MD  07/19/2018, 6:39 PM

## 2018-07-19 NOTE — Progress Notes (Signed)
  Echocardiogram 2D Echocardiogram has been performed.  Kevin Castillo 07/19/2018, 8:51 AM

## 2018-07-20 ENCOUNTER — Encounter (HOSPITAL_COMMUNITY): Payer: Self-pay | Admitting: *Deleted

## 2018-07-20 ENCOUNTER — Other Ambulatory Visit: Payer: Self-pay | Admitting: Internal Medicine

## 2018-07-20 DIAGNOSIS — N185 Chronic kidney disease, stage 5: Secondary | ICD-10-CM

## 2018-07-20 DIAGNOSIS — I5022 Chronic systolic (congestive) heart failure: Secondary | ICD-10-CM

## 2018-07-20 LAB — LIPID PANEL
CHOL/HDL RATIO: 2.5 ratio
CHOLESTEROL: 155 mg/dL (ref 0–200)
HDL: 61 mg/dL (ref >40)
LDL Cholesterol: 88 mg/dL (ref 0–99)
TRIGLYCERIDES: 31 mg/dL (ref <150)
VLDL: 6 mg/dL (ref 0–40)

## 2018-07-20 LAB — BASIC METABOLIC PANEL
Anion gap: 10 (ref 5–15)
BUN: 85 mg/dL — ABNORMAL HIGH (ref 8–23)
CHLORIDE: 103 mmol/L (ref 98–111)
CO2: 27 mmol/L (ref 22–32)
CREATININE: 7.56 mg/dL — AB (ref 0.61–1.24)
Calcium: 8.6 mg/dL — ABNORMAL LOW (ref 8.9–10.3)
GFR calc Af Amer: 7 mL/min — ABNORMAL LOW (ref >60)
GFR, EST NON AFRICAN AMERICAN: 6 mL/min — AB (ref >60)
GLUCOSE: 111 mg/dL — AB (ref 70–99)
Potassium: 4.4 mmol/L (ref 3.5–5.1)
Sodium: 140 mmol/L (ref 135–145)

## 2018-07-20 LAB — CBC
HEMATOCRIT: 27.2 % — AB (ref 39.0–52.0)
HEMOGLOBIN: 8.3 g/dL — AB (ref 13.0–17.0)
MCH: 28.3 pg (ref 26.0–34.0)
MCHC: 30.5 g/dL (ref 30.0–36.0)
MCV: 92.8 fL (ref 78.0–100.0)
Platelets: 150 10*3/uL (ref 150–400)
RBC: 2.93 MIL/uL — AB (ref 4.22–5.81)
RDW: 16.4 % — ABNORMAL HIGH (ref 11.5–15.5)
WBC: 3.1 10*3/uL — ABNORMAL LOW (ref 4.0–10.5)

## 2018-07-20 LAB — URIC ACID: URIC ACID, SERUM: 9.5 mg/dL — AB (ref 3.7–8.6)

## 2018-07-20 MED ORDER — FUROSEMIDE 10 MG/ML IJ SOLN
80.0000 mg | Freq: Three times a day (TID) | INTRAMUSCULAR | Status: DC
  Administered 2018-07-20 – 2018-07-26 (×16): 80 mg via INTRAVENOUS
  Filled 2018-07-20 (×16): qty 8

## 2018-07-20 MED ORDER — FUROSEMIDE 80 MG PO TABS: 160.0000 mg | ORAL_TABLET | Freq: Two times a day (BID) | ORAL | 3 refills | Status: DC

## 2018-07-20 MED ORDER — TIZANIDINE HCL 2 MG PO TABS
2.0000 mg | ORAL_TABLET | Freq: Three times a day (TID) | ORAL | Status: DC
  Administered 2018-07-20 – 2018-07-26 (×18): 2 mg via ORAL
  Filled 2018-07-20 (×21): qty 1

## 2018-07-20 NOTE — Progress Notes (Signed)
10 beats of nonsustained Vtach noted. Patient had longer beats on 31st.

## 2018-07-20 NOTE — Progress Notes (Signed)
Ref: Oval Linsey, MD   Subjective:  Improving breathing. Leg edema persist. Also c/o left lumbar pain.  X-ray abdomen was unremarkable.  Echocardiogram shows moderate MR, TR and decreased LV and RV systolic function with EF 40-45 %. He has elevated BUN and Creatinine and uric acid level.  Objective:  Vital Signs in the last 24 hours: Temp:  [97.5 F (36.4 C)-97.7 F (36.5 C)] 97.5 F (36.4 C) (08/02 0527) Pulse Rate:  [64-71] 64 (08/02 0527) Cardiac Rhythm: Normal sinus rhythm (08/02 0930) Resp:  [19] 19 (08/02 0527) BP: (150-162)/(69-76) 151/76 (08/02 0527) SpO2:  [92 %-98 %] 98 % (08/02 0527) Weight:  [89.7 kg (197 lb 12.8 oz)] 89.7 kg (197 lb 12.8 oz) (08/02 0527)  Physical Exam: BP Readings from Last 1 Encounters:  07/20/18 (!) 151/76     Wt Readings from Last 1 Encounters:  07/20/18 89.7 kg (197 lb 12.8 oz)    Weight change: -0.771 kg (-1 lb 11.2 oz) Body mass index is 30.98 kg/m. HEENT: [place]/AT, Eyes-Brown, Conjunctiva-Pale, Sclera-Non-icteric Neck: + JVD, No bruit, Trachea midline. Lungs:  Clearing, Bilateral. Cardiac:  Regular rhythm, normal S1 and S2, no S3. III/VI systolic murmur. Abdomen:  Soft, non-tender. BS present. Extremities:  2 + edema present. No cyanosis. No clubbing. CNS: AxOx3, Cranial nerves grossly intact, moves all 4 extremities.  Skin: Warm and dry.   Intake/Output from previous day: 08/01 0701 - 08/02 0700 In: 922 [P.O.:922] Out: 1150 [Urine:1150]    Lab Results: BMET    Component Value Date/Time   NA 140 07/20/2018 0426   NA 142 07/19/2018 0443   NA 140 07/18/2018 1720   NA 144 07/11/2018 1637   NA 143 05/25/2017 1205   NA 142 08/05/2016 1115   K 4.4 07/20/2018 0426   K 4.8 07/19/2018 0443   K 4.4 07/18/2018 1720   CL 103 07/20/2018 0426   CL 105 07/19/2018 0443   CL 105 07/18/2018 1720   CO2 27 07/20/2018 0426   CO2 25 07/19/2018 0443   CO2 23 07/18/2018 1720   GLUCOSE 111 (H) 07/20/2018 0426   GLUCOSE 100 (H)  07/19/2018 0443   GLUCOSE 96 07/18/2018 1720   BUN 85 (H) 07/20/2018 0426   BUN 77 (H) 07/19/2018 0443   BUN 77 (H) 07/18/2018 1720   BUN 56 (H) 07/11/2018 1637   BUN 37 (H) 05/25/2017 1205   BUN 25 08/05/2016 1115   CREATININE 7.56 (H) 07/20/2018 0426   CREATININE 7.41 (H) 07/19/2018 0443   CREATININE 7.41 (H) 07/18/2018 1720   CREATININE 1.54 (H) 05/28/2014 1046   CREATININE 1.43 (H) 03/05/2014 1532   CREATININE 1.47 (H) 12/30/2013 1532   CALCIUM 8.6 (L) 07/20/2018 0426   CALCIUM 8.6 (L) 07/19/2018 0443   CALCIUM 8.8 (L) 07/18/2018 1720   GFRNONAA 6 (L) 07/20/2018 0426   GFRNONAA 6 (L) 07/19/2018 0443   GFRNONAA 6 (L) 07/18/2018 1720   GFRNONAA 46 (L) 05/28/2014 1046   GFRNONAA 50 (L) 03/05/2014 1532   GFRNONAA 55 (L) 11/18/2013 1101   GFRAA 7 (L) 07/20/2018 0426   GFRAA 8 (L) 07/19/2018 0443   GFRAA 8 (L) 07/18/2018 1720   GFRAA 53 (L) 05/28/2014 1046   GFRAA 58 (L) 03/05/2014 1532   GFRAA 64 11/18/2013 1101   CBC    Component Value Date/Time   WBC 3.1 (L) 07/20/2018 0426   RBC 2.93 (L) 07/20/2018 0426   HGB 8.3 (L) 07/20/2018 0426   HGB 8.9 (L) 07/11/2018 1637   HCT  27.2 (L) 07/20/2018 0426   HCT 27.8 (L) 07/11/2018 1637   PLT 150 07/20/2018 0426   PLT 122 (L) 07/11/2018 1637   MCV 92.8 07/20/2018 0426   MCV 88 07/11/2018 1637   MCH 28.3 07/20/2018 0426   MCHC 30.5 07/20/2018 0426   RDW 16.4 (H) 07/20/2018 0426   RDW 18.2 (H) 07/11/2018 1637   LYMPHSABS 0.9 07/18/2018 1720   LYMPHSABS 1.4 08/05/2016 1115   MONOABS 0.3 07/18/2018 1720   EOSABS 0.0 07/18/2018 1720   EOSABS 0.0 08/05/2016 1115   BASOSABS 0.0 07/18/2018 1720   BASOSABS 0.0 08/05/2016 1115   HEPATIC Function Panel Recent Labs    07/11/18 1637 07/18/18 1720  PROT 7.3 7.9   HEMOGLOBIN A1C No components found for: HGA1C,  MPG CARDIAC ENZYMES Lab Results  Component Value Date   CKTOTAL 64 05/26/2009   CKMB 0.7 05/26/2009   TROPONINI 0.03 (HH) 07/19/2018   TROPONINI 0.03 (HH) 07/18/2018    TROPONINI 0.03 (HH) 07/18/2018   BNP No results for input(s): PROBNP in the last 8760 hours. TSH Recent Labs    07/18/18 1720  TSH 2.379   CHOLESTEROL Recent Labs    07/20/18 0426  CHOL 155    Scheduled Meds: . atorvastatin  20 mg Oral Daily  . calcitRIOL  0.5 mcg Oral Daily  . finasteride  5 mg Oral Daily  . furosemide  80 mg Intravenous Q8H  . heparin  5,000 Units Subcutaneous Q8H  . labetalol  200 mg Oral BID  . multivitamin with minerals  1 tablet Oral q morning - 10a  . sodium chloride flush  3 mL Intravenous Q12H  . terazosin  10 mg Oral QHS  . tiZANidine  2 mg Oral TID   Continuous Infusions: . sodium chloride     PRN Meds:.sodium chloride, albuterol, fluticasone, HYDROcodone-acetaminophen, sodium chloride flush  Assessment/Plan: Acute on chronic systolic left heart failure CAD CABG COPD Type 2 DM ESRD Anemia of chronic disease Tobacco use disorder Alcohol use disorder  Increase lasix dose. Change flexeril to Tizanidine.   LOS: 2 days    Dixie Dials  MD  07/20/2018, 4:25 PM

## 2018-07-21 MED ORDER — METOLAZONE 5 MG PO TABS
5.0000 mg | ORAL_TABLET | Freq: Every day | ORAL | Status: DC
  Administered 2018-07-21 – 2018-07-25 (×5): 5 mg via ORAL
  Filled 2018-07-21 (×6): qty 1

## 2018-07-21 MED ORDER — LIDOCAINE 5 % EX PTCH
1.0000 | MEDICATED_PATCH | Freq: Every day | CUTANEOUS | Status: DC
  Administered 2018-07-21 – 2018-07-26 (×6): 1 via TRANSDERMAL
  Filled 2018-07-21 (×6): qty 1

## 2018-07-21 NOTE — Progress Notes (Signed)
Ref: Oval Linsey, MD   Subjective:  Awake. Asking for lidocaine patch for back pain. VS stable. Leg edema persist.  Objective:  Vital Signs in the last 24 hours: Temp:  [97.9 F (36.6 C)-98 F (36.7 C)] 98 F (36.7 C) (08/03 0534) Pulse Rate:  [63-64] 63 (08/03 0534) Cardiac Rhythm: Normal sinus rhythm (08/03 0715) Resp:  [18] 18 (08/03 0534) BP: (144-151)/(58-77) 144/77 (08/03 0534) SpO2:  [96 %] 96 % (08/03 0534) Weight:  [89.3 kg (196 lb 14.4 oz)] 89.3 kg (196 lb 14.4 oz) (08/03 0534)  Physical Exam: BP Readings from Last 1 Encounters:  07/21/18 (!) 144/77     Wt Readings from Last 1 Encounters:  07/21/18 89.3 kg (196 lb 14.4 oz)    Weight change: -0.408 kg (-14.4 oz) Body mass index is 30.84 kg/m. HEENT: [place]/AT, Eyes-Brown, Conjunctiva-[place], Sclera-Non-icteric Neck: No JVD, No bruit, Trachea midline. Lungs:  Clear, Bilateral. Cardiac:  Regular rhythm, normal S1 and S2, no S3. II/VI systolic murmur. Abdomen:  Soft, non-tender. BS present. Extremities:  2 + edema present. No cyanosis. No clubbing. CNS: AxOx3, Cranial nerves grossly intact, moves all 4 extremities.  Skin: Warm and dry.   Intake/Output from previous day: 08/02 0701 - 08/03 0700 In: 1182 [P.O.:1182] Out: 2595 [Urine:1275]    Lab Results: BMET    Component Value Date/Time   NA 140 07/20/2018 0426   NA 142 07/19/2018 0443   NA 140 07/18/2018 1720   NA 144 07/11/2018 1637   NA 143 05/25/2017 1205   NA 142 08/05/2016 1115   K 4.4 07/20/2018 0426   K 4.8 07/19/2018 0443   K 4.4 07/18/2018 1720   CL 103 07/20/2018 0426   CL 105 07/19/2018 0443   CL 105 07/18/2018 1720   CO2 27 07/20/2018 0426   CO2 25 07/19/2018 0443   CO2 23 07/18/2018 1720   GLUCOSE 111 (H) 07/20/2018 0426   GLUCOSE 100 (H) 07/19/2018 0443   GLUCOSE 96 07/18/2018 1720   BUN 85 (H) 07/20/2018 0426   BUN 77 (H) 07/19/2018 0443   BUN 77 (H) 07/18/2018 1720   BUN 56 (H) 07/11/2018 1637   BUN 37 (H) 05/25/2017 1205    BUN 25 08/05/2016 1115   CREATININE 7.56 (H) 07/20/2018 0426   CREATININE 7.41 (H) 07/19/2018 0443   CREATININE 7.41 (H) 07/18/2018 1720   CREATININE 1.54 (H) 05/28/2014 1046   CREATININE 1.43 (H) 03/05/2014 1532   CREATININE 1.47 (H) 12/30/2013 1532   CALCIUM 8.6 (L) 07/20/2018 0426   CALCIUM 8.6 (L) 07/19/2018 0443   CALCIUM 8.8 (L) 07/18/2018 1720   GFRNONAA 6 (L) 07/20/2018 0426   GFRNONAA 6 (L) 07/19/2018 0443   GFRNONAA 6 (L) 07/18/2018 1720   GFRNONAA 46 (L) 05/28/2014 1046   GFRNONAA 50 (L) 03/05/2014 1532   GFRNONAA 55 (L) 11/18/2013 1101   GFRAA 7 (L) 07/20/2018 0426   GFRAA 8 (L) 07/19/2018 0443   GFRAA 8 (L) 07/18/2018 1720   GFRAA 53 (L) 05/28/2014 1046   GFRAA 58 (L) 03/05/2014 1532   GFRAA 64 11/18/2013 1101   CBC    Component Value Date/Time   WBC 3.1 (L) 07/20/2018 0426   RBC 2.93 (L) 07/20/2018 0426   HGB 8.3 (L) 07/20/2018 0426   HGB 8.9 (L) 07/11/2018 1637   HCT 27.2 (L) 07/20/2018 0426   HCT 27.8 (L) 07/11/2018 1637   PLT 150 07/20/2018 0426   PLT 122 (L) 07/11/2018 1637   MCV 92.8 07/20/2018 0426  MCV 88 07/11/2018 1637   MCH 28.3 07/20/2018 0426   MCHC 30.5 07/20/2018 0426   RDW 16.4 (H) 07/20/2018 0426   RDW 18.2 (H) 07/11/2018 1637   LYMPHSABS 0.9 07/18/2018 1720   LYMPHSABS 1.4 08/05/2016 1115   MONOABS 0.3 07/18/2018 1720   EOSABS 0.0 07/18/2018 1720   EOSABS 0.0 08/05/2016 1115   BASOSABS 0.0 07/18/2018 1720   BASOSABS 0.0 08/05/2016 1115   HEPATIC Function Panel Recent Labs    07/11/18 1637 07/18/18 1720  PROT 7.3 7.9   HEMOGLOBIN A1C No components found for: HGA1C,  MPG CARDIAC ENZYMES Lab Results  Component Value Date   CKTOTAL 64 05/26/2009   CKMB 0.7 05/26/2009   TROPONINI 0.03 (HH) 07/19/2018   TROPONINI 0.03 (HH) 07/18/2018   TROPONINI 0.03 (HH) 07/18/2018   BNP No results for input(s): PROBNP in the last 8760 hours. TSH Recent Labs    07/18/18 1720  TSH 2.379   CHOLESTEROL Recent Labs    07/20/18 0426   CHOL 155    Scheduled Meds: . atorvastatin  20 mg Oral Daily  . calcitRIOL  0.5 mcg Oral Daily  . finasteride  5 mg Oral Daily  . furosemide  80 mg Intravenous Q8H  . heparin  5,000 Units Subcutaneous Q8H  . labetalol  200 mg Oral BID  . metolazone  5 mg Oral Daily  . multivitamin with minerals  1 tablet Oral q morning - 10a  . sodium chloride flush  3 mL Intravenous Q12H  . terazosin  10 mg Oral QHS  . tiZANidine  2 mg Oral TID   Continuous Infusions: . sodium chloride     PRN Meds:.sodium chloride, albuterol, fluticasone, HYDROcodone-acetaminophen, sodium chloride flush  Assessment/Plan: Acute on chronic systolic left heart failure CAD CABG COPD Type 2 DM ESRD Anemia of chronic disease Tobacco use disorder Alcohol use disorder  Apply [facility]. Add Zaroxolyn Increase activity. Lidocaine patch   LOS: 3 days    Dixie Dials  MD  07/21/2018, 9:58 AM

## 2018-07-22 LAB — BASIC METABOLIC PANEL
Anion gap: 13 (ref 5–15)
BUN: 83 mg/dL — AB (ref 8–23)
CO2: 27 mmol/L (ref 22–32)
CREATININE: 7.79 mg/dL — AB (ref 0.61–1.24)
Calcium: 9.2 mg/dL (ref 8.9–10.3)
Chloride: 97 mmol/L — ABNORMAL LOW (ref 98–111)
GFR calc Af Amer: 7 mL/min — ABNORMAL LOW (ref >60)
GFR calc non Af Amer: 6 mL/min — ABNORMAL LOW (ref >60)
GLUCOSE: 138 mg/dL — AB (ref 70–99)
Potassium: 4.4 mmol/L (ref 3.5–5.1)
Sodium: 137 mmol/L (ref 135–145)

## 2018-07-22 MED ORDER — CHLORHEXIDINE GLUCONATE 4 % EX LIQD
60.0000 mL | Freq: Once | CUTANEOUS | Status: AC
  Administered 2018-07-23: 4 via TOPICAL
  Filled 2018-07-22: qty 15

## 2018-07-22 MED ORDER — CHLORHEXIDINE GLUCONATE 4 % EX LIQD
60.0000 mL | Freq: Once | CUTANEOUS | Status: AC
  Administered 2018-07-22: 4 via TOPICAL
  Filled 2018-07-22: qty 15

## 2018-07-22 MED ORDER — SODIUM CHLORIDE 0.9 % IV SOLN
INTRAVENOUS | Status: DC
  Administered 2018-07-23: via INTRAVENOUS

## 2018-07-22 MED ORDER — CEFAZOLIN SODIUM-DEXTROSE 2-4 GM/100ML-% IV SOLN
2.0000 g | INTRAVENOUS | Status: AC
  Administered 2018-07-23: 2 g via INTRAVENOUS
  Filled 2018-07-22 (×2): qty 100

## 2018-07-22 NOTE — Progress Notes (Signed)
Ref: Oval Linsey, MD   Subjective:  Awake and afebrile. Had visit by Dr. Ruta Hinds, vascular surgeon.   Objective:  Vital Signs in the last 24 hours: Temp:  [97.5 F (36.4 C)-98.7 F (37.1 C)] 97.5 F (36.4 C) (08/04 0509) Pulse Rate:  [60-85] 85 (08/04 0509) Cardiac Rhythm: Normal sinus rhythm (08/04 0900) Resp:  [16] 16 (08/04 0509) BP: (134-166)/(61-80) 162/73 (08/04 0509) SpO2:  [96 %-98 %] 96 % (08/04 0509) Weight:  [88.3 kg (194 lb 9.6 oz)] 88.3 kg (194 lb 9.6 oz) (08/04 0509)  Physical Exam: BP Readings from Last 1 Encounters:  07/22/18 (!) 162/73     Wt Readings from Last 1 Encounters:  07/22/18 88.3 kg (194 lb 9.6 oz)    Weight change: -1.043 kg (-2 lb 4.8 oz) Body mass index is 30.48 kg/m. HEENT: [place]/AT, Eyes-Brown, Conjunctiva-Pale, Sclera-Non-icteric Neck: No JVD, No bruit, Trachea midline. Lungs:  Clear, Bilateral. Cardiac:  Regular rhythm, normal S1 and S2, no S3. II/VI systolic murmur. Abdomen:  Soft, non-tender. BS present. Extremities:  2 + edema present. No cyanosis. No clubbing. CNS: AxOx3, Cranial nerves grossly intact, moves all 4 extremities.  Skin: Warm and dry.   Intake/Output from previous day: 08/03 0701 - 08/04 0700 In: 1363 [P.O.:1360; I.V.:3] Out: 2175 [Urine:2175]    Lab Results: BMET    Component Value Date/Time   NA 137 07/22/2018 1019   NA 140 07/20/2018 0426   NA 142 07/19/2018 0443   NA 144 07/11/2018 1637   NA 143 05/25/2017 1205   NA 142 08/05/2016 1115   K 4.4 07/22/2018 1019   K 4.4 07/20/2018 0426   K 4.8 07/19/2018 0443   CL 97 (L) 07/22/2018 1019   CL 103 07/20/2018 0426   CL 105 07/19/2018 0443   CO2 27 07/22/2018 1019   CO2 27 07/20/2018 0426   CO2 25 07/19/2018 0443   GLUCOSE 138 (H) 07/22/2018 1019   GLUCOSE 111 (H) 07/20/2018 0426   GLUCOSE 100 (H) 07/19/2018 0443   BUN 83 (H) 07/22/2018 1019   BUN 85 (H) 07/20/2018 0426   BUN 77 (H) 07/19/2018 0443   BUN 56 (H) 07/11/2018 1637   BUN 37 (H)  05/25/2017 1205   BUN 25 08/05/2016 1115   CREATININE 7.79 (H) 07/22/2018 1019   CREATININE 7.56 (H) 07/20/2018 0426   CREATININE 7.41 (H) 07/19/2018 0443   CREATININE 1.54 (H) 05/28/2014 1046   CREATININE 1.43 (H) 03/05/2014 1532   CREATININE 1.47 (H) 12/30/2013 1532   CALCIUM 9.2 07/22/2018 1019   CALCIUM 8.6 (L) 07/20/2018 0426   CALCIUM 8.6 (L) 07/19/2018 0443   GFRNONAA 6 (L) 07/22/2018 1019   GFRNONAA 6 (L) 07/20/2018 0426   GFRNONAA 6 (L) 07/19/2018 0443   GFRNONAA 46 (L) 05/28/2014 1046   GFRNONAA 50 (L) 03/05/2014 1532   GFRNONAA 55 (L) 11/18/2013 1101   GFRAA 7 (L) 07/22/2018 1019   GFRAA 7 (L) 07/20/2018 0426   GFRAA 8 (L) 07/19/2018 0443   GFRAA 53 (L) 05/28/2014 1046   GFRAA 58 (L) 03/05/2014 1532   GFRAA 64 11/18/2013 1101   CBC    Component Value Date/Time   WBC 3.1 (L) 07/20/2018 0426   RBC 2.93 (L) 07/20/2018 0426   HGB 8.3 (L) 07/20/2018 0426   HGB 8.9 (L) 07/11/2018 1637   HCT 27.2 (L) 07/20/2018 0426   HCT 27.8 (L) 07/11/2018 1637   PLT 150 07/20/2018 0426   PLT 122 (L) 07/11/2018 1637   MCV 92.8  07/20/2018 0426   MCV 88 07/11/2018 1637   MCH 28.3 07/20/2018 0426   MCHC 30.5 07/20/2018 0426   RDW 16.4 (H) 07/20/2018 0426   RDW 18.2 (H) 07/11/2018 1637   LYMPHSABS 0.9 07/18/2018 1720   LYMPHSABS 1.4 08/05/2016 1115   MONOABS 0.3 07/18/2018 1720   EOSABS 0.0 07/18/2018 1720   EOSABS 0.0 08/05/2016 1115   BASOSABS 0.0 07/18/2018 1720   BASOSABS 0.0 08/05/2016 1115   HEPATIC Function Panel Recent Labs    07/11/18 1637 07/18/18 1720  PROT 7.3 7.9   HEMOGLOBIN A1C No components found for: HGA1C,  MPG CARDIAC ENZYMES Lab Results  Component Value Date   CKTOTAL 64 05/26/2009   CKMB 0.7 05/26/2009   TROPONINI 0.03 (HH) 07/19/2018   TROPONINI 0.03 (HH) 07/18/2018   TROPONINI 0.03 (HH) 07/18/2018   BNP No results for input(s): PROBNP in the last 8760 hours. TSH Recent Labs    07/18/18 1720  TSH 2.379   CHOLESTEROL Recent Labs     07/20/18 0426  CHOL 155    Scheduled Meds: . atorvastatin  20 mg Oral Daily  . calcitRIOL  0.5 mcg Oral Daily  . finasteride  5 mg Oral Daily  . furosemide  80 mg Intravenous Q8H  . heparin  5,000 Units Subcutaneous Q8H  . labetalol  200 mg Oral BID  . lidocaine  1 patch Transdermal Daily  . metolazone  5 mg Oral Daily  . multivitamin with minerals  1 tablet Oral q morning - 10a  . sodium chloride flush  3 mL Intravenous Q12H  . terazosin  10 mg Oral QHS  . tiZANidine  2 mg Oral TID   Continuous Infusions: . sodium chloride     PRN Meds:.sodium chloride, albuterol, fluticasone, HYDROcodone-acetaminophen, sodium chloride flush  Assessment/Plan: Acute on chronic systolic left heart failure CAD CABG COPD ESRD Type 2 DM Anemia of chronic disease Tobacco use disorder Alcohol use disorder  Continue zaroxolyn. Awaiting Palindrome catheter and right upper arm AV fistula graft placement by Dr. Bridgett Larsson in AM.   LOS: 4 days    Dixie Dials  MD  07/22/2018, 11:58 AM

## 2018-07-22 NOTE — Progress Notes (Signed)
Pt subjectively better oxygenation.  He seems reasonable enough to undergo access procedure tomorrow.    Will plan palindrome catheter and right upper arm graft tomorrow with Dr Bridgett Larsson unless we hear otherwise  Npo Consent Discussed with pt  Ruta Hinds, MD [facility] Office: [phone] Pager: [phone]

## 2018-07-23 ENCOUNTER — Inpatient Hospital Stay (HOSPITAL_COMMUNITY): Payer: Medicare HMO

## 2018-07-23 ENCOUNTER — Encounter (HOSPITAL_COMMUNITY): Payer: Self-pay

## 2018-07-23 ENCOUNTER — Inpatient Hospital Stay (HOSPITAL_COMMUNITY): Payer: Medicare HMO | Admitting: Anesthesiology

## 2018-07-23 ENCOUNTER — Encounter (HOSPITAL_COMMUNITY)
Admission: AD | Disposition: A | Discharge status: Home / Self Care | Payer: Self-pay | Source: Ambulatory Visit | Attending: Cardiovascular Disease

## 2018-07-23 ENCOUNTER — Ambulatory Visit (HOSPITAL_COMMUNITY): Admission: RE | Admit: 2018-07-23 | Payer: Medicare HMO | Source: Ambulatory Visit | Admitting: Vascular Surgery

## 2018-07-23 DIAGNOSIS — N185 Chronic kidney disease, stage 5: Secondary | ICD-10-CM

## 2018-07-23 HISTORY — PX: AV FISTULA PLACEMENT: SHX1204

## 2018-07-23 HISTORY — PX: INSERTION OF DIALYSIS CATHETER: SHX1324

## 2018-07-23 LAB — GLUCOSE, CAPILLARY
GLUCOSE-CAPILLARY: 118 mg/dL — AB (ref 70–99)
Glucose-Capillary: 110 mg/dL — ABNORMAL HIGH (ref 70–99)
Glucose-Capillary: 104 mg/dL — ABNORMAL HIGH (ref 70–99)

## 2018-07-23 LAB — BASIC METABOLIC PANEL
Anion gap: 14 (ref 5–15)
BUN: 87 mg/dL — AB (ref 8–23)
CALCIUM: 9.2 mg/dL (ref 8.9–10.3)
CO2: 25 mmol/L (ref 22–32)
Chloride: 98 mmol/L (ref 98–111)
Creatinine, Ser: 7.84 mg/dL — ABNORMAL HIGH (ref 0.61–1.24)
GFR calc Af Amer: 7 mL/min — ABNORMAL LOW (ref >60)
GFR, EST NON AFRICAN AMERICAN: 6 mL/min — AB (ref >60)
GLUCOSE: 115 mg/dL — AB (ref 70–99)
Potassium: 4.6 mmol/L (ref 3.5–5.1)
Sodium: 137 mmol/L (ref 135–145)

## 2018-07-23 LAB — CBC
HCT: 28.6 % — ABNORMAL LOW (ref 39.0–52.0)
Hemoglobin: 8.8 g/dL — ABNORMAL LOW (ref 13.0–17.0)
MCH: 28.5 pg (ref 26.0–34.0)
MCHC: 30.8 g/dL (ref 30.0–36.0)
MCV: 92.6 fL (ref 78.0–100.0)
Platelets: 154 10*3/uL (ref 150–400)
RBC: 3.09 MIL/uL — ABNORMAL LOW (ref 4.22–5.81)
RDW: 16.4 % — AB (ref 11.5–15.5)
WBC: 3.1 10*3/uL — ABNORMAL LOW (ref 4.0–10.5)

## 2018-07-23 SURGERY — INSERTION OF DIALYSIS CATHETER
Anesthesia: Monitor Anesthesia Care | Site: Chest | Laterality: Right

## 2018-07-23 MED ORDER — LIDOCAINE-EPINEPHRINE (PF) 1 %-1:200000 IJ SOLN
INTRAMUSCULAR | Status: AC
  Filled 2018-07-23: qty 30

## 2018-07-23 MED ORDER — MIDAZOLAM HCL 2 MG/2ML IJ SOLN
INTRAMUSCULAR | Status: DC | PRN
  Administered 2018-07-23 (×2): .5 mg via INTRAVENOUS

## 2018-07-23 MED ORDER — LIDOCAINE HCL (CARDIAC) PF 100 MG/5ML IV SOSY
PREFILLED_SYRINGE | INTRAVENOUS | Status: DC | PRN
  Administered 2018-07-23: 60 mg via INTRAVENOUS

## 2018-07-23 MED ORDER — ALLOPURINOL 100 MG PO TABS
100.0000 mg | ORAL_TABLET | Freq: Every day | ORAL | Status: DC
  Administered 2018-07-24 – 2018-07-26 (×3): 100 mg via ORAL
  Filled 2018-07-23 (×3): qty 1

## 2018-07-23 MED ORDER — SODIUM CHLORIDE 0.9 % IV SOLN
INTRAVENOUS | Status: DC | PRN
  Administered 2018-07-23: 13:00:00

## 2018-07-23 MED ORDER — LIDOCAINE HCL (PF) 1 % IJ SOLN
INTRAMUSCULAR | Status: DC | PRN
  Administered 2018-07-23: 30 mL

## 2018-07-23 MED ORDER — PROPOFOL 10 MG/ML IV BOLUS
INTRAVENOUS | Status: DC | PRN
  Administered 2018-07-23: 10 mg via INTRAVENOUS
  Administered 2018-07-23: 40 mg via INTRAVENOUS

## 2018-07-23 MED ORDER — LIDOCAINE HCL (PF) 1 % IJ SOLN
INTRAMUSCULAR | Status: AC
  Filled 2018-07-23: qty 30

## 2018-07-23 MED ORDER — PHENYLEPHRINE HCL 10 MG/ML IJ SOLN
INTRAMUSCULAR | Status: DC | PRN
  Administered 2018-07-23: 80 ug via INTRAVENOUS

## 2018-07-23 MED ORDER — FENTANYL CITRATE (PF) 100 MCG/2ML IJ SOLN
25.0000 ug | INTRAMUSCULAR | Status: DC | PRN
  Administered 2018-07-23: 50 ug via INTRAVENOUS

## 2018-07-23 MED ORDER — SODIUM CHLORIDE 0.9 % IV SOLN
INTRAVENOUS | Status: DC
  Administered 2018-07-23: 11:00:00 via INTRAVENOUS

## 2018-07-23 MED ORDER — FENTANYL CITRATE (PF) 100 MCG/2ML IJ SOLN
INTRAMUSCULAR | Status: AC
  Filled 2018-07-23: qty 2

## 2018-07-23 MED ORDER — PROPOFOL 500 MG/50ML IV EMUL
INTRAVENOUS | Status: DC | PRN
  Administered 2018-07-23: 40 ug/kg/min via INTRAVENOUS

## 2018-07-23 MED ORDER — HEPARIN SODIUM (PORCINE) 1000 UNIT/ML IJ SOLN
INTRAMUSCULAR | Status: DC | PRN
  Administered 2018-07-23: 1000 [IU]

## 2018-07-23 MED ORDER — PROMETHAZINE HCL 25 MG/ML IJ SOLN
6.2500 mg | INTRAMUSCULAR | Status: DC | PRN
Start: 2018-07-23 — End: 2018-07-23

## 2018-07-23 MED ORDER — MICROFIBRILLAR COLL HEMOSTAT EX PADS
MEDICATED_PAD | CUTANEOUS | Status: DC | PRN
  Administered 2018-07-23: 1 via TOPICAL

## 2018-07-23 MED ORDER — HEPARIN SODIUM (PORCINE) 1000 UNIT/ML IJ SOLN
INTRAMUSCULAR | Status: AC
Start: 2018-07-23 — End: ?
  Filled 2018-07-23: qty 1

## 2018-07-23 MED ORDER — 0.9 % SODIUM CHLORIDE (POUR BTL) OPTIME
TOPICAL | Status: DC | PRN
  Administered 2018-07-23: 1000 mL

## 2018-07-23 SURGICAL SUPPLY — 67 items
ADH SKN CLS APL DERMABOND .7 (GAUZE/BANDAGES/DRESSINGS) ×6
AGENT HMST SPONGE THK3/8 (HEMOSTASIS) ×3
ARMBAND PINK RESTRICT EXTREMIT (MISCELLANEOUS) ×10 IMPLANT
BAG DECANTER FOR FLEXI CONT (MISCELLANEOUS) ×5 IMPLANT
BIOPATCH RED 1 DISK 7.0 (GAUZE/BANDAGES/DRESSINGS) ×4 IMPLANT
BIOPATCH RED 1IN DISK 7.0MM (GAUZE/BANDAGES/DRESSINGS) ×1
CANISTER SUCT 3000ML PPV (MISCELLANEOUS) ×5 IMPLANT
CATH PALINDROME RT-P 15FX19CM (CATHETERS) IMPLANT
CATH PALINDROME RT-P 15FX23CM (CATHETERS) ×3 IMPLANT
CATH PALINDROME RT-P 15FX28CM (CATHETERS) IMPLANT
CATH PALINDROME RT-P 15FX55CM (CATHETERS) IMPLANT
CATH STRAIGHT 5FR 65CM (CATHETERS) IMPLANT
CLIP VESOCCLUDE MED 6/CT (CLIP) ×5 IMPLANT
CLIP VESOCCLUDE SM WIDE 6/CT (CLIP) ×5 IMPLANT
COVER PROBE W GEL 5X96 (DRAPES) ×8 IMPLANT
COVER SURGICAL LIGHT HANDLE (MISCELLANEOUS) ×5 IMPLANT
DECANTER SPIKE VIAL GLASS SM (MISCELLANEOUS) ×5 IMPLANT
DERMABOND ADVANCED (GAUZE/BANDAGES/DRESSINGS) ×4
DERMABOND ADVANCED .7 DNX12 (GAUZE/BANDAGES/DRESSINGS) ×4 IMPLANT
DRAPE C-ARM 42X72 X-RAY (DRAPES) ×5 IMPLANT
DRAPE CHEST BREAST 15X10 FENES (DRAPES) ×5 IMPLANT
ELECT REM PT RETURN 9FT ADLT (ELECTROSURGICAL) ×5
ELECTRODE REM PT RTRN 9FT ADLT (ELECTROSURGICAL) ×3 IMPLANT
GAUZE SPONGE 4X4 16PLY XRAY LF (GAUZE/BANDAGES/DRESSINGS) ×5 IMPLANT
GLOVE BIO SURGEON STRL SZ 6.5 (GLOVE) ×12 IMPLANT
GLOVE BIO SURGEON STRL SZ7 (GLOVE) ×11 IMPLANT
GLOVE BIO SURGEONS STRL SZ 6.5 (GLOVE) ×6
GLOVE BIOGEL PI IND STRL 7.0 (GLOVE) ×2 IMPLANT
GLOVE BIOGEL PI IND STRL 7.5 (GLOVE) ×4 IMPLANT
GLOVE BIOGEL PI INDICATOR 7.0 (GLOVE) ×4
GLOVE BIOGEL PI INDICATOR 7.5 (GLOVE) ×4
GLOVE SURG SS PI 6.5 STRL IVOR (GLOVE) ×3 IMPLANT
GOWN STRL REUS W/ TWL LRG LVL3 (GOWN DISPOSABLE) ×11 IMPLANT
GOWN STRL REUS W/TWL LRG LVL3 (GOWN DISPOSABLE) ×25
HEMOSTAT SPONGE AVITENE ULTRA (HEMOSTASIS) ×3 IMPLANT
KIT BASIN OR (CUSTOM PROCEDURE TRAY) ×5 IMPLANT
KIT TURNOVER KIT B (KITS) ×5 IMPLANT
NDL 18GX1X1/2 (RX/OR ONLY) (NEEDLE) ×2 IMPLANT
NDL HYPO 25GX1X1/2 BEV (NEEDLE) ×2 IMPLANT
NEEDLE 18GX1X1/2 (RX/OR ONLY) (NEEDLE) ×10 IMPLANT
NEEDLE HYPO 25GX1X1/2 BEV (NEEDLE) ×5 IMPLANT
NS IRRIG 1000ML POUR BTL (IV SOLUTION) ×5 IMPLANT
PACK CV ACCESS (CUSTOM PROCEDURE TRAY) ×5 IMPLANT
PACK SURGICAL SETUP 50X90 (CUSTOM PROCEDURE TRAY) ×5 IMPLANT
PAD ARMBOARD 7.5X6 YLW CONV (MISCELLANEOUS) ×10 IMPLANT
SET MICROPUNCTURE 5F STIFF (MISCELLANEOUS) IMPLANT
SOAP 2 % CHG 4 OZ (WOUND CARE) ×5 IMPLANT
SUT ETHILON 3 0 PS 1 (SUTURE) ×5 IMPLANT
SUT GORETEX 6.0 TT13 (SUTURE) IMPLANT
SUT MNCRL AB 4-0 PS2 18 (SUTURE) ×8 IMPLANT
SUT PROLENE 6 0 BV (SUTURE) ×2 IMPLANT
SUT PROLENE 6 0 CC (SUTURE) ×9 IMPLANT
SUT PROLENE 7 0 BV 1 (SUTURE) IMPLANT
SUT SILK 2 0 PERMA HAND 18 BK (SUTURE) IMPLANT
SUT VIC AB 3-0 SH 27 (SUTURE) ×10
SUT VIC AB 3-0 SH 27X BRD (SUTURE) ×6 IMPLANT
SYR 10ML LL (SYRINGE) ×5 IMPLANT
SYR 20CC LL (SYRINGE) ×10 IMPLANT
SYR 3ML LL SCALE MARK (SYRINGE) ×8 IMPLANT
SYR 5ML LL (SYRINGE) ×5 IMPLANT
SYR CONTROL 10ML LL (SYRINGE) ×5 IMPLANT
SYR TOOMEY 50ML (SYRINGE) IMPLANT
TOWEL GREEN STERILE (TOWEL DISPOSABLE) ×5 IMPLANT
TOWEL GREEN STERILE FF (TOWEL DISPOSABLE) ×5 IMPLANT
UNDERPAD 30X30 (UNDERPADS AND DIAPERS) ×5 IMPLANT
WATER STERILE IRR 1000ML POUR (IV SOLUTION) ×5 IMPLANT
WIRE AMPLATZ SS-J .035X180CM (WIRE) IMPLANT

## 2018-07-23 NOTE — H&P (Signed)
Brief History and Physical  History of Present Illness   Kevin Zanni Sr. is a 72 y.o. male who presents with chief complaint: ERSD.  The patient presents today for [facility] placement, RUA AVG.   This patient was seen by Dr. Oneida Alar over one year ago.  At that time, he had recommended RUA AVG due to somewhat aberrant anatomy in left arm.  Past Medical History:  Diagnosis Date  . Acute left systolic heart failure ([place]) 01/06/2016  . Adenoma of left adrenal gland 06/16/2014   CT Abdomen (06/04/2014): Incidental, 20 mm, 8.33 HU suggesting benign adenoma   . Alcohol abuse 02/15/2007   Reports consuming 1 pint of gin on weekends, does not drink every day. Denies history of seizures, blackouts, or tremors.    . Allergic rhinitis 04/09/2014  . Anemia 11/12/2013   Unclear cause as of yet (? EtOH abuse)   . Anemia of chronic kidney failure, stage 4 (severe) ([place]) 07/28/2017  . Benign prostatic hypertrophy 06/02/2014  . Chronic kidney disease, stage 3 ([place]) 11/12/2013  . Chronic systolic heart failure ([place]) 01/15/2016   Echo (01/07/2016): LVEF 35%  . COPD (chronic obstructive pulmonary disease) ([place])   . Coronary artery disease 02/15/2007   Non-STEMI 07/2005, s/p CABG x 4 (08/01/2005): LIMA to LAD, SVG to OM, RCA, PCA   . DVT (deep venous thrombosis) ([place]) 2006   RLE "when I had OHS"  . Erectile dysfunction associated with type 2 diabetes mellitus ([place]) 02/15/2007  . Essential hypertension 02/15/2007  . Hyperlipidemia 02/15/2007  . Major depression, single episode 11/02/2015  . Obesity (BMI 30.0-34.9) 02/15/2007  . Peripheral vascular occlusive disease ([place]) 12/30/2013   ABI (01/03/2014): Right 0.64, Left 0.64   . Pneumonia X 1  . Secondary hyperparathyroidism of renal origin ([place]) 07/28/2017  . Sigmoid diverticulosis 01/15/2016   Minimal per colonoscopy 01/11/2016  . Tobacco abuse 02/15/2007  . Tubular adenoma of colon 01/11/2016   Two 6 mm semi-pedunculated tubular adenomas excised endoscopically  01/11/2016.  Repeat colonoscopy due 12/2020.  . Type 2 diabetes mellitus with mild nonproliferative diabetic retinopathy with macular edema, bilateral ([place]) 10/28/2016  . Type 2 diabetes mellitus with stage 3 chronic kidney disease ([place]) 02/15/2007  . Venous thromboembolism 11/11/2013   LE Dopplers (01/2011): RLE DVT. CTA with mild subacute to chronic right-sided pulmonary emboli.  Patient has elected to continue anticoagulation.     Past Surgical History:  Procedure Laterality Date  . CARDIAC CATHETERIZATION  07/2005  . CATARACT EXTRACTION W/ INTRAOCULAR LENS  IMPLANT, BILATERAL Bilateral 04/2014   Left 04/30/2014, Right 05/07/2014  . COLONOSCOPY WITH PROPOFOL N/A 01/11/2016   Procedure: COLONOSCOPY WITH PROPOFOL;  Surgeon: Ronald Lobo, MD;  Location: [facility];  Service: Endoscopy;  Laterality: N/A;  Needs MAC.  Marland Kitchen CORONARY ARTERY BYPASS GRAFT  08/01/2005   x 4, LIMA to LAD, SVG to OM, RCA, PCA  . ESOPHAGOGASTRODUODENOSCOPY N/A 01/08/2016   Procedure: ESOPHAGOGASTRODUODENOSCOPY (EGD);  Surgeon: Wilford Corner, MD;  Location: [facility] ENDOSCOPY;  Service: Endoscopy;  Laterality: N/A;  . GIVENS CAPSULE STUDY N/A 01/12/2016   Procedure: GIVENS CAPSULE STUDY;  Surgeon: Ronald Lobo, MD;  Location: [facility];  Service: Endoscopy;  Laterality: N/A;    Social History   Socioeconomic History  . Marital status: Married    Spouse name: Not on file  . Number of children: Not on file  . Years of education: 95  . Highest education level: Not on file  Occupational History  . 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 Financial resource strain: Not on file  . Food insecurity:    Worry: Not on file    Inability: Not on file  . Transportation needs:    Medical: Not on file    Non-medical: Not on file  Tobacco Use  . Smoking status: Current Every Day Smoker    Packs/day: 0.30    Years: 60.00    Pack years: 18.00    Types: Cigarettes   . Smokeless tobacco: Former Systems developer    Types: Snuff, Chew  Substance and Sexual Activity  . Alcohol use: Yes    Alcohol/week: 3.0 oz    Types: 5 Shots of liquor per week    Comment: 07/18/2018 Drinks 1/2 pint of gin over the weekend but does not drink everyday. Denies hx of blackouts, seizures, tremors.  . Drug use: No  . Sexual activity: Not Currently    Birth control/protection: None  Lifestyle  . Physical activity:    Days per week: Not on file    Minutes per session: Not on file  . Stress: Not on file  Relationships  . Social connections:    Talks on phone: Not on file    Gets together: Not on file    Attends religious service: Not on file    Active member of club or organization: Not on file    Attends meetings of clubs or organizations: Not on file    Relationship status: Not on file  . Intimate partner violence:    Fear of current or ex partner: Not on file    Emotionally abused: Not on file    Physically abused: Not on file    Forced sexual activity: Not on file  Other Topics Concern  . Not on file  Social History Narrative   Married, lives with his wife in [place]. 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 Only one son and one daughter live in [place]. No pets.    Family History  Problem Relation Age of Onset  . Heart disease Father   . COPD Father   . Diabetes Mellitus II Mother   . Healthy Sister   . Healthy Brother   . Healthy Daughter   . Healthy Son   . Healthy Sister   . Healthy Sister   . Healthy Sister   . Healthy Sister   . Healthy Daughter   . Healthy Son   . Healthy Son   . Healthy Son   . Healthy Son   . Healthy Son   . Healthy Son     Current Facility-Administered Medications  Medication Dose Route Frequency Provider Last Rate Last Dose  . [MAR Hold] 0.9 %  sodium chloride infusion  250 mL Intravenous PRN Dixie Dials, MD      . 0.9 %  sodium chloride infusion   Intravenous Continuous Elam Dutch, MD 10 mL/hr at  07/23/18 0001    . 0.9 %  sodium chloride infusion   Intravenous Continuous Myrtie Soman, MD 10 mL/hr at 07/23/18 1039    . [MAR Hold] albuterol (PROVENTIL) (2.5 MG/3ML) 0.083% nebulizer solution 3 mL  3 mL Inhalation Q6H PRN Dixie Dials, MD      . Doug Sou Hold] allopurinol (ZYLOPRIM) tablet 100 mg  100 mg Oral Daily Dixie Dials, MD      . Doug Sou Hold] atorvastatin (LIPITOR) tablet 20 mg  20 mg Oral Daily Kadakia,  Vicenta Aly, MD   20 mg at 07/23/18 0941  . [MAR Hold] calcitRIOL (ROCALTROL) capsule 0.5 mcg  0.5 mcg Oral Daily Dixie Dials, MD   0.5 mcg at 07/23/18 0937  . ceFAZolin (ANCEF) IVPB 2g/100 mL premix  2 g Intravenous 30 min Pre-Op Elam Dutch, MD      . Doug Sou Hold] finasteride (PROSCAR) tablet 5 mg  5 mg Oral Daily Dixie Dials, MD   5 mg at 07/23/18 0937  . [MAR Hold] fluticasone (FLONASE) 50 MCG/ACT nasal spray 1 spray  1 spray Each Nare PRN Dixie Dials, MD      . Doug Sou Hold] furosemide (LASIX) injection 80 mg  80 mg Intravenous Q8H Dixie Dials, MD   80 mg at 07/23/18 0541  . [MAR Hold] heparin injection 5,000 Units  5,000 Units Subcutaneous Q8H Dixie Dials, MD   Stopped at 07/22/18 2146  . [MAR Hold] HYDROcodone-acetaminophen (NORCO/VICODIN) 5-325 MG per tablet 1-2 tablet  1-2 tablet Oral Q6H PRN Dixie Dials, MD   2 tablet at 07/23/18 0003  . [MAR Hold] labetalol (NORMODYNE) tablet 200 mg  200 mg Oral BID Dixie Dials, MD   200 mg at 07/23/18 0937  . [MAR Hold] lidocaine (LIDODERM) 5 % 1 patch  1 patch Transdermal Daily Dixie Dials, MD   1 patch at 07/23/18 0934  . [MAR Hold] metolazone (ZAROXOLYN) tablet 5 mg  5 mg Oral Daily Dixie Dials, MD   5 mg at 07/22/18 0904  . [MAR Hold] multivitamin with minerals tablet 1 tablet  1 tablet Oral q morning - 10a Dixie Dials, MD   1 tablet at 07/23/18 0937  . [MAR Hold] sodium chloride flush (NS) 0.9 % injection 3 mL  3 mL Intravenous Q12H Dixie Dials, MD   3 mL at 07/22/18 2258  . [MAR Hold] sodium chloride flush (NS) 0.9 %  injection 3 mL  3 mL Intravenous PRN Dixie Dials, MD      . Doug Sou Hold] terazosin (HYTRIN) capsule 10 mg  10 mg Oral QHS Dixie Dials, MD   10 mg at 07/22/18 2203  . [MAR Hold] tiZANidine (ZANAFLEX) tablet 2 mg  2 mg Oral TID Dixie Dials, MD   2 mg at 07/23/18 0941    Allergies  Allergen Reactions  . Nicotine Transdermal System [Nicotine] Other (See Comments)    Vivid dreams    Review of Systems: As listed above, otherwise negative.   Physical Examination   Vitals:   07/22/18 1443 07/22/18 2050 07/23/18 0504 07/23/18 0937  BP: (!) 153/75 (!) 156/60 (!) 160/62 (!) 180/88  Pulse: 61 70 68 70  Resp:  20 20   Temp: 97.6 F (36.4 C) 98.5 F (36.9 C) 97.7 F (36.5 C)   TempSrc: Oral Oral Oral   SpO2: 98% 96% 96%   Weight:   191 lb 9.6 oz (86.9 kg)   Height:       Body mass index is 30.01 kg/m.  General Alert, O x 3, WD, NAD  Pulmonary Sym exp, good B air movt, CTA B  Cardiac RRR, Nl S1, S2, no Murmurs, No rubs, No S3,S4  Musculo- skeletal No graft or fistula in either arm, motor intact   Neurologic Pain and light touch intact in extremities,    Laboratory   CBC CBC Latest Ref Rng & Units 07/23/2018 07/20/2018 07/19/2018  WBC 4.0 - 10.5 K/uL 3.1(L) 3.1(L) 3.0(L)  Hemoglobin 13.0 - 17.0 g/dL 8.8(L) 8.3(L) 7.8(L)  Hematocrit 39.0 - 52.0 % 28.6(L) 27.2(L) 25.5(L)  Platelets 150 - 400 K/uL 154 150 156    BMP BMP Latest Ref Rng & Units 07/23/2018 07/22/2018 07/20/2018  Glucose 70 - 99 mg/dL 115(H) 138(H) 111(H)  BUN 8 - 23 mg/dL 87(H) 83(H) 85(H)  Creatinine 0.61 - 1.24 mg/dL 7.84(H) 7.79(H) 7.56(H)  BUN/Creat Ratio 10 - 24 - - -  Sodium 135 - 145 mmol/L 137 137 140  Potassium 3.5 - 5.1 mmol/L 4.6 4.4 4.4  Chloride 98 - 111 mmol/L 98 97(L) 103  CO2 22 - 32 mmol/L 25 27 27   Calcium 8.9 - 10.3 mg/dL 9.2 9.2 8.6(L)    Coagulation Lab Results  Component Value Date   INR 1.18 07/19/2018   INR 1.0 03/09/2015   INR 2.00 06/23/2014   No results found for: PTT  Lipids      Component Value Date/Time   CHOL 155 07/20/2018 0426   TRIG 31 07/20/2018 0426   HDL 61 07/20/2018 0426   CHOLHDL 2.5 07/20/2018 0426   VLDL 6 07/20/2018 0426   LDLCALC 88 07/20/2018 0426    Medical Decision Making   Kevin Sinning Sr. is a 72 y.o. male who presents with: end stage renal disease .   The patient is scheduled for: tunneled dialysis catheter placement, right upper arm arteriovenous graft placement.  Risk, benefits, and alternatives to access surgery were discussed.  The patient is aware the risks include but are not limited to: bleeding, infection, steal syndrome, nerve damage, ischemic monomelic neuropathy, failure to mature, and need for additional procedures. The patient is aware the risks of tunneled dialysis catheter placement include but are not limited to: bleeding, infection, central venous injury, pneumothorax, possible venous stenosis, possible malpositioning in the venous system, and possible infections related to long-term catheter presence.   The patient is aware of the risks and agrees to proceed.   Adele Barthel, MD, FACS [facility] Office: [phone] Pager: [phone]  07/23/2018, 11:29 AM

## 2018-07-23 NOTE — Op Note (Signed)
OPERATIVE NOTE  PROCEDURE: 1.  Right internal jugular vein tunneled dialysis catheter placement 2.  Right internal jugular vein cannulation under ultrasound guidance 3.  Right brachiocephalic arteriovenous fistula   PRE-OPERATIVE DIAGNOSIS: end-stage renal failure  POST-OPERATIVE DIAGNOSIS: same as above  SURGEON: Adele Barthel, MD  ANESTHESIA: local and MAC  ESTIMATED BLOOD LOSS: 30 cc  FINDING(S): 1.  Tips of the catheter in the right atrium on fluoroscopy 2.  No obvious pneumothorax on fluoroscopy 3.  Faintly palpable thrill at end of the case 4.  Dopplerable radial artery signal at end of case   SPECIMEN(S):  none  INDICATIONS:   Kevin Rushlow Sr. is a 72 y.o. male who presents with hemodialysis dependence.  The patient presents for tunneled dialysis catheter placement and right arm arteriovenous graft placement.  The patient is aware the risks of tunneled dialysis catheter placement include but are not limited to: bleeding, infection, central venous injury, pneumothorax, possible venous stenosis, possible malpositioning in the venous system, and possible infections related to long-term catheter presence.  Risk, benefits, and alternatives to access surgery were discussed.  The patient is aware the risks include but are not limited to: bleeding, infection, steal syndrome, nerve damage, ischemic monomelic neuropathy, thrombosis, failure to mature, complications related to venous hypertension, need for additional procedures, death and stroke.  The patient agrees to proceed forward with the procedure.   DESCRIPTION: After written full informed consent was obtained from the patient, the patient was taken back to the operating room.  Prior to induction, the patient was given IV antibiotics.  After obtaining adequate sedation, the patient was prepped and draped in the standard fashion for a chest or neck tunneled dialysis catheter placement.     The cannulation site, the catheter  exit site, and tract for the subcutaneous tunnel were then anesthestized with a total of 20 cc of 1% lidocaine without epinephrine.  Under ultrasound guidance, the right internal jugular vein was cannulated with the 18 gauge needle.  A J-wire was then placed down into the inferior vena cava under fluoroscopic guidance.  The wire was then secured in place with a clamp to the drapes.  The tapered wire guide was left occluding the lumen of the needle to avoid air embolism.    I then made stab incisions at the neck and exit sites.   I dissected from the exit site to the cannulation site with a tunneler.   The wire was then unclamped and I removed the needle.  The skin tract and venotomy was dilated serially with graduated dilators, passed under fluoroscopic guidance.   Finally, the dilator-sheath was placed under fluoroscopic guidance into the superior vena cava.  The central dilator and wire were removed.  A 23 cm Palindrome catheter was placed under fluoroscopic guidance down into the right atrium.    The sheath was broken and peeled away while holding the catheter cuff at the level of the skin.  The catheter was clamped with the plastic clamp and the back end of this catheter was transected.  One lumen was docked onto the tunneler.  The catheter was delivered through the subcutaneous tunnel by pulling the tunneler out the exit site.  The catheter was reclamped and was transected a second time, revealing the two lumens of this catheter.  The catheter collar was loaded over the back end of the catheter.  The two  ports were docked onto these two lumens.  The catheter collar was then snapped into place, securing  the two ports.  Each port was tested by aspirating and flushing.  No resistance was noted.  Each port was then thoroughly flushed with heparinized saline.  The catheter was secured in placed with two interrupted stitches of 3-0 Nylon tied to the catheter.  The neck incision was closed with a U-stitch of 4-0  Monocryl.  The neck and chest incision were cleaned.  The closed neck incision was reinforced with Dermabond and sterile bandages applied to the catheter exit site.  Each port was then loaded with concentrated heparin (1000 Units/mL) at the manufacturer recommended volumes to each port.  Sterile caps were applied to each port.    On completion fluoroscopy, the tips of the catheter were in the right atrium, and there was no evidence of pneumothorax.  At this point, the drapes were taken down.  The patient was repositioned, reprepped and redraped for a right arm access procedure.  I turned my attention first to the antecubitum.  Under ultrasound guidance, I identified the location of the brachial artery and marked it on the skin.  I then examined the bicipital groove and identified the high brachial vein and marked it on the skin.  I also examined the cephalic vein at the antecubitum.  It appeared adequate for use for a fistula.    At this point, I injected local anesthetic to obtain a field block of the antecubitum.  In total, I injected about 5 mL of 1% lidocaine without epinephrine.  I made a transverse incision at the level of the antecubitum and dissected through the subcutaneous tissue and fascia to gain exposure of the brachial artery.  This was noted to be 4.5 mm in diameter externally with obvious calcific atherosclerotic disease.  This was dissected out proximally and distally and controlled with vessel loops .  I then dissected out the cephalic vein.  This was noted to be 3 mm in diameter externally.  The distal segment of the vein was ligated with a  2-0 silk, and the vein was transected.  The proximal segment was interrogated with serial dilators.  The vein accepted up to a 4 mm dilator without any difficulty.  I then instilled the heparinized saline into the vein and clamped it.  At this point, I reset my exposure of the brachial artery and placed the artery under tension proximally and distally.   I made an arteriotomy with a #11 blade, and then I extended the arteriotomy with a Potts scissor.   This brachial artery had 1-2 mm of calcific atherosclerosis in its anterior wall.   I injected heparinized saline proximal and distal to this arteriotomy.  The vein was then sewn to the artery in an end-to-side configuration with a running stitch of 6-0 Prolene.  Prior to completing this anastomosis, I allowed the vein and artery to backbleed.  There was no evidence of clot from any vessels.  I completed the anastomosis in the usual fashion and then released all vessel loops and clamps.    There was a faintly palpable thrill in the venous outflow, and there was a dopplerable radial signal.  At this point, I irrigated out the surgical wound.  I controlled bleeding points with electrocautery and Avitene.  There was no further active bleeding.  The subcutaneous tissue was reapproximated with a running stitch of 3-0 Vicryl.  The skin was then reapproximated with a running subcuticular stitch of 4-0 Vicryl.  The skin was then cleaned, dried, and reinforced with Dermabond.  The patient tolerated  this procedure well.    COMPLICATIONS: none   CONDITION: stable   Adele Barthel, MD, [facility] [place] Office: [phone] Pager: [phone]  07/23/2018, 1:09 PM

## 2018-07-23 NOTE — Progress Notes (Signed)
Ref: Oval Linsey, MD   Subjective:  Awake. Breathing is improving. Fair diuresis. 2 + edema of lower leg persist but skin is not as tight as on admission.  Objective:  Vital Signs in the last 24 hours: Temp:  [97.6 F (36.4 C)-98.5 F (36.9 C)] 97.7 F (36.5 C) (08/05 0504) Pulse Rate:  [61-70] 70 (08/05 0937) Cardiac Rhythm: Normal sinus rhythm (08/05 0709) Resp:  [20] 20 (08/05 0504) BP: (153-180)/(60-88) 180/88 (08/05 0937) SpO2:  [96 %-98 %] 96 % (08/05 0504) Weight:  [86.9 kg (191 lb 9.6 oz)] 86.9 kg (191 lb 9.6 oz) (08/05 0504)  Physical Exam: BP Readings from Last 1 Encounters:  07/23/18 (!) 180/88     Wt Readings from Last 1 Encounters:  07/23/18 86.9 kg (191 lb 9.6 oz)    Weight change: -1.361 kg (-3 lb) Body mass index is 30.01 kg/m. HEENT: [place]/AT, Eyes-Brown, PERL, EOMI, Conjunctiva-Pale, Sclera-Non-icteric Neck: No JVD, No bruit, Trachea midline. Lungs:  Clear, Bilateral. Cardiac:  Regular rhythm, normal S1 and S2, no S3. II/VI systolic murmur. Abdomen:  Soft, non-tender. BS present. Extremities:  2 + edema present. No cyanosis. No clubbing. CNS: AxOx3, Cranial nerves grossly intact, moves all 4 extremities.  Skin: Warm and dry.   Intake/Output from previous day: 08/04 0701 - 08/05 0700 In: 311.6 [P.O.:240; I.V.:71.6] Out: 2000 [Urine:2000]    Lab Results: BMET    Component Value Date/Time   NA 137 07/23/2018 0524   NA 137 07/22/2018 1019   NA 140 07/20/2018 0426   NA 144 07/11/2018 1637   NA 143 05/25/2017 1205   NA 142 08/05/2016 1115   K 4.6 07/23/2018 0524   K 4.4 07/22/2018 1019   K 4.4 07/20/2018 0426   CL 98 07/23/2018 0524   CL 97 (L) 07/22/2018 1019   CL 103 07/20/2018 0426   CO2 25 07/23/2018 0524   CO2 27 07/22/2018 1019   CO2 27 07/20/2018 0426   GLUCOSE 115 (H) 07/23/2018 0524   GLUCOSE 138 (H) 07/22/2018 1019   GLUCOSE 111 (H) 07/20/2018 0426   BUN 87 (H) 07/23/2018 0524   BUN 83 (H) 07/22/2018 1019   BUN 85 (H)  07/20/2018 0426   BUN 56 (H) 07/11/2018 1637   BUN 37 (H) 05/25/2017 1205   BUN 25 08/05/2016 1115   CREATININE 7.84 (H) 07/23/2018 0524   CREATININE 7.79 (H) 07/22/2018 1019   CREATININE 7.56 (H) 07/20/2018 0426   CREATININE 1.54 (H) 05/28/2014 1046   CREATININE 1.43 (H) 03/05/2014 1532   CREATININE 1.47 (H) 12/30/2013 1532   CALCIUM 9.2 07/23/2018 0524   CALCIUM 9.2 07/22/2018 1019   CALCIUM 8.6 (L) 07/20/2018 0426   GFRNONAA 6 (L) 07/23/2018 0524   GFRNONAA 6 (L) 07/22/2018 1019   GFRNONAA 6 (L) 07/20/2018 0426   GFRNONAA 46 (L) 05/28/2014 1046   GFRNONAA 50 (L) 03/05/2014 1532   GFRNONAA 55 (L) 11/18/2013 1101   GFRAA 7 (L) 07/23/2018 0524   GFRAA 7 (L) 07/22/2018 1019   GFRAA 7 (L) 07/20/2018 0426   GFRAA 53 (L) 05/28/2014 1046   GFRAA 58 (L) 03/05/2014 1532   GFRAA 64 11/18/2013 1101   CBC    Component Value Date/Time   WBC 3.1 (L) 07/23/2018 0524   RBC 3.09 (L) 07/23/2018 0524   HGB 8.8 (L) 07/23/2018 0524   HGB 8.9 (L) 07/11/2018 1637   HCT 28.6 (L) 07/23/2018 0524   HCT 27.8 (L) 07/11/2018 1637   PLT 154 07/23/2018 0524  PLT 122 (L) 07/11/2018 1637   MCV 92.6 07/23/2018 0524   MCV 88 07/11/2018 1637   MCH 28.5 07/23/2018 0524   MCHC 30.8 07/23/2018 0524   RDW 16.4 (H) 07/23/2018 0524   RDW 18.2 (H) 07/11/2018 1637   LYMPHSABS 0.9 07/18/2018 1720   LYMPHSABS 1.4 08/05/2016 1115   MONOABS 0.3 07/18/2018 1720   EOSABS 0.0 07/18/2018 1720   EOSABS 0.0 08/05/2016 1115   BASOSABS 0.0 07/18/2018 1720   BASOSABS 0.0 08/05/2016 1115   HEPATIC Function Panel Recent Labs    07/11/18 1637 07/18/18 1720  PROT 7.3 7.9   HEMOGLOBIN A1C No components found for: HGA1C,  MPG CARDIAC ENZYMES Lab Results  Component Value Date   CKTOTAL 64 05/26/2009   CKMB 0.7 05/26/2009   TROPONINI 0.03 (HH) 07/19/2018   TROPONINI 0.03 (HH) 07/18/2018   TROPONINI 0.03 (HH) 07/18/2018   BNP No results for input(s): PROBNP in the last 8760 hours. TSH Recent Labs     07/18/18 1720  TSH 2.379   CHOLESTEROL Recent Labs    07/20/18 0426  CHOL 155    Scheduled Meds: . atorvastatin  20 mg Oral Daily  . calcitRIOL  0.5 mcg Oral Daily  . finasteride  5 mg Oral Daily  . furosemide  80 mg Intravenous Q8H  . heparin  5,000 Units Subcutaneous Q8H  . labetalol  200 mg Oral BID  . lidocaine  1 patch Transdermal Daily  . metolazone  5 mg Oral Daily  . multivitamin with minerals  1 tablet Oral q morning - 10a  . sodium chloride flush  3 mL Intravenous Q12H  . terazosin  10 mg Oral QHS  . tiZANidine  2 mg Oral TID   Continuous Infusions: . sodium chloride    . sodium chloride 10 mL/hr at 07/23/18 0001  .  ceFAZolin (ANCEF) IV     PRN Meds:.sodium chloride, albuterol, fluticasone, HYDROcodone-acetaminophen, sodium chloride flush  Assessment/Plan: Acute on chronic systolic left heart failure CAD CABG COPD ESRD Type 2 DM Anemia of chronic disease Tobacco use disorder Alcohol use disorder Hyperuricemia  Appreciate vascular surgery treatment and follow up. Add allopurinol   LOS: 5 days    Dixie Dials  MD  07/23/2018, 9:54 AM

## 2018-07-23 NOTE — Anesthesia Preprocedure Evaluation (Signed)
Anesthesia Evaluation    Airway Mallampati: II  TM Distance: >3 FB Neck ROM: Full    Dental no notable dental hx.    Pulmonary Current Smoker,    Pulmonary exam normal breath sounds clear to auscultation       Cardiovascular hypertension, + CAD, + CABG, + Peripheral Vascular Disease and +CHF   Rhythm:Regular Rate:Normal + Systolic murmurs Left ventricle: The cavity size was mildly dilated. Systolic   function was mildly to moderately reduced. The estimated ejection   fraction was in the range of 40% to 45%. There is mild   hypokinesis of the entireanterior and inferior myocardium. - Aortic valve: Transvalvular velocity was increased, due to   stenosis. There was mild stenosis. - Mitral valve: There was moderate regurgitation. - Left atrium: The atrium was moderately to severely dilated. - Right ventricle: Systolic function was mildly to moderately   reduced. - Right atrium: The atrium was moderately dilated. - Tricuspid valve: There was moderate regurgitation   Neuro/Psych    GI/Hepatic (+)     substance abuse  alcohol use,   Endo/Other  diabetes  Renal/GU DialysisRenal disease     Musculoskeletal   Abdominal   Peds  Hematology   Anesthesia Other Findings   Reproductive/Obstetrics                             Anesthesia Physical Anesthesia Plan  ASA: IV  Anesthesia Plan: MAC   Post-op Pain Management:    Induction: Intravenous  PONV Risk Score and Plan: 0  Airway Management Planned: Simple Face Mask  Additional Equipment:   Intra-op Plan:   Post-operative Plan:   Informed Consent: I have reviewed the patients History and Physical, chart, labs and discussed the procedure including the risks, benefits and alternatives for the proposed anesthesia with the patient or authorized representative who has indicated his/her understanding and acceptance.   Dental advisory  given  Plan Discussed with: CRNA and Surgeon  Anesthesia Plan Comments:         Anesthesia Quick Evaluation

## 2018-07-23 NOTE — Transfer of Care (Signed)
Immediate Anesthesia Transfer of Care Note  Patient: Mychael Smock Sr.  Procedure(s) Performed: INSERTION OF  DIALYSIS CATHETER - RIGHT INTERNAL JUGULAR PLACEMENT (Right Chest) RIGHT UPPER ARM ARTERIOVENOUS (AV) BRACHIOCEPHALIC FISTULA CREATION (Arm Upper)  Patient Location: PACU  Anesthesia Type:MAC  Level of Consciousness: awake  Airway & Oxygen Therapy: Patient Spontanous Breathing and Patient connected to face mask oxygen  Post-op Assessment: Report given to RN, Post -op Vital signs reviewed and stable and Patient moving all extremities  Post vital signs: Reviewed and stable  Last Vitals:  Vitals Value Taken Time  BP 146/66 07/23/2018  2:26 PM  Temp 36.4 C 07/23/2018  2:25 PM  Pulse 57 07/23/2018  2:29 PM  Resp 20 07/23/2018  2:29 PM  SpO2 100 % 07/23/2018  2:29 PM  Vitals shown include unvalidated device data.  Last Pain:  Vitals:   07/23/18 1425  TempSrc:   PainSc: (P) Asleep      Patients Stated Pain Goal: 2 (82/50/03 7048)  Complications: No apparent anesthesia complications

## 2018-07-24 ENCOUNTER — Telehealth: Payer: Self-pay | Admitting: Vascular Surgery

## 2018-07-24 MED ORDER — BISACODYL 10 MG RE SUPP
10.0000 mg | Freq: Once | RECTAL | Status: DC
  Filled 2018-07-24: qty 1

## 2018-07-24 NOTE — Anesthesia Postprocedure Evaluation (Signed)
Anesthesia Post Note  Patient: Kevin Fredericks Sr.  Procedure(s) Performed: INSERTION OF  DIALYSIS CATHETER - RIGHT INTERNAL JUGULAR PLACEMENT (Right Chest) RIGHT UPPER ARM ARTERIOVENOUS (AV) BRACHIOCEPHALIC FISTULA CREATION (Arm Upper)     Patient location during evaluation: PACU Anesthesia Type: MAC Level of consciousness: awake and alert Pain management: pain level controlled Vital Signs Assessment: post-procedure vital signs reviewed and stable Respiratory status: spontaneous breathing, nonlabored ventilation, respiratory function stable and patient connected to nasal cannula oxygen Cardiovascular status: stable and blood pressure returned to baseline Postop Assessment: no apparent nausea or vomiting Anesthetic complications: no    Last Vitals:  Vitals:   07/24/18 0605 07/24/18 0954  BP: (!) 162/66 (!) 144/56  Pulse: 64 68  Resp: (!) 21   Temp: 36.4 C   SpO2: 100%     Last Pain:  Vitals:   07/24/18 1005  TempSrc:   PainSc: 8                  Melissa Pulido S

## 2018-07-24 NOTE — Progress Notes (Signed)
Ref: Oval Linsey, MD   Subjective:  Had [facility] placed today. Also has AVF in Right BC area. Fair diuresis last 2 days. Further decrease in leg edema. Afebrile.  Objective:  Vital Signs in the last 24 hours: Temp:  [97.6 F (36.4 C)-97.8 F (36.6 C)] 97.7 F (36.5 C) (08/06 1406) Pulse Rate:  [64-70] 70 (08/06 1406) Cardiac Rhythm: Normal sinus rhythm (08/06 1005) Resp:  [17-21] 18 (08/06 1406) BP: (144-168)/(56-84) 168/84 (08/06 1406) SpO2:  [100 %] 100 % (08/06 1406) Weight:  [86.3 kg (190 lb 3.2 oz)] 86.3 kg (190 lb 3.2 oz) (08/06 1856)  Physical Exam: BP Readings from Last 1 Encounters:  07/24/18 (!) 168/84     Wt Readings from Last 1 Encounters:  07/24/18 86.3 kg (190 lb 3.2 oz)    Weight change: -0.635 kg (-1 lb 6.4 oz) Body mass index is 29.79 kg/m. HEENT: [place]/AT, Eyes-Brown, PERL, EOMI, Conjunctiva-Pale, Sclera-Non-icteric Neck: No JVD, No bruit, Trachea midline. Lungs:  Clear, Bilateral. Cardiac:  Regular rhythm, normal S1 and S2, no S3. II/VI systolic murmur. Abdomen:  Soft, non-tender. BS present. Extremities:  2 + edema present. No cyanosis. No clubbing. CNS: AxOx3, Cranial nerves grossly intact, moves all 4 extremities.  Skin: Warm and dry.   Intake/Output from previous day: 08/05 0701 - 08/06 0700 In: 0  Out: 1110 [Urine:1100; Blood:10]    Lab Results: BMET    Component Value Date/Time   NA 137 07/23/2018 0524   NA 137 07/22/2018 1019   NA 140 07/20/2018 0426   NA 144 07/11/2018 1637   NA 143 05/25/2017 1205   NA 142 08/05/2016 1115   K 4.6 07/23/2018 0524   K 4.4 07/22/2018 1019   K 4.4 07/20/2018 0426   CL 98 07/23/2018 0524   CL 97 (L) 07/22/2018 1019   CL 103 07/20/2018 0426   CO2 25 07/23/2018 0524   CO2 27 07/22/2018 1019   CO2 27 07/20/2018 0426   GLUCOSE 115 (H) 07/23/2018 0524   GLUCOSE 138 (H) 07/22/2018 1019   GLUCOSE 111 (H) 07/20/2018 0426   BUN 87 (H) 07/23/2018 0524   BUN 83 (H) 07/22/2018 1019   BUN 85 (H) 07/20/2018  0426   BUN 56 (H) 07/11/2018 1637   BUN 37 (H) 05/25/2017 1205   BUN 25 08/05/2016 1115   CREATININE 7.84 (H) 07/23/2018 0524   CREATININE 7.79 (H) 07/22/2018 1019   CREATININE 7.56 (H) 07/20/2018 0426   CREATININE 1.54 (H) 05/28/2014 1046   CREATININE 1.43 (H) 03/05/2014 1532   CREATININE 1.47 (H) 12/30/2013 1532   CALCIUM 9.2 07/23/2018 0524   CALCIUM 9.2 07/22/2018 1019   CALCIUM 8.6 (L) 07/20/2018 0426   GFRNONAA 6 (L) 07/23/2018 0524   GFRNONAA 6 (L) 07/22/2018 1019   GFRNONAA 6 (L) 07/20/2018 0426   GFRNONAA 46 (L) 05/28/2014 1046   GFRNONAA 50 (L) 03/05/2014 1532   GFRNONAA 55 (L) 11/18/2013 1101   GFRAA 7 (L) 07/23/2018 0524   GFRAA 7 (L) 07/22/2018 1019   GFRAA 7 (L) 07/20/2018 0426   GFRAA 53 (L) 05/28/2014 1046   GFRAA 58 (L) 03/05/2014 1532   GFRAA 64 11/18/2013 1101   CBC    Component Value Date/Time   WBC 3.1 (L) 07/23/2018 0524   RBC 3.09 (L) 07/23/2018 0524   HGB 8.8 (L) 07/23/2018 0524   HGB 8.9 (L) 07/11/2018 1637   HCT 28.6 (L) 07/23/2018 0524   HCT 27.8 (L) 07/11/2018 1637   PLT 154 07/23/2018 0524  PLT 122 (L) 07/11/2018 1637   MCV 92.6 07/23/2018 0524   MCV 88 07/11/2018 1637   MCH 28.5 07/23/2018 0524   MCHC 30.8 07/23/2018 0524   RDW 16.4 (H) 07/23/2018 0524   RDW 18.2 (H) 07/11/2018 1637   LYMPHSABS 0.9 07/18/2018 1720   LYMPHSABS 1.4 08/05/2016 1115   MONOABS 0.3 07/18/2018 1720   EOSABS 0.0 07/18/2018 1720   EOSABS 0.0 08/05/2016 1115   BASOSABS 0.0 07/18/2018 1720   BASOSABS 0.0 08/05/2016 1115   HEPATIC Function Panel Recent Labs    07/11/18 1637 07/18/18 1720  PROT 7.3 7.9   HEMOGLOBIN A1C No components found for: HGA1C,  MPG CARDIAC ENZYMES Lab Results  Component Value Date   CKTOTAL 64 05/26/2009   CKMB 0.7 05/26/2009   TROPONINI 0.03 (HH) 07/19/2018   TROPONINI 0.03 (HH) 07/18/2018   TROPONINI 0.03 (HH) 07/18/2018   BNP No results for input(s): PROBNP in the last 8760 hours. TSH Recent Labs    07/18/18 1720   TSH 2.379   CHOLESTEROL Recent Labs    07/20/18 0426  CHOL 155    Scheduled Meds: . allopurinol  100 mg Oral Daily  . atorvastatin  20 mg Oral Daily  . bisacodyl  10 mg Rectal Once  . calcitRIOL  0.5 mcg Oral Daily  . finasteride  5 mg Oral Daily  . furosemide  80 mg Intravenous Q8H  . heparin  5,000 Units Subcutaneous Q8H  . labetalol  200 mg Oral BID  . lidocaine  1 patch Transdermal Daily  . metolazone  5 mg Oral Daily  . multivitamin with minerals  1 tablet Oral q morning - 10a  . sodium chloride flush  3 mL Intravenous Q12H  . terazosin  10 mg Oral QHS  . tiZANidine  2 mg Oral TID   Continuous Infusions: . sodium chloride    . sodium chloride 10 mL/hr at 07/23/18 1039   PRN Meds:.sodium chloride, albuterol, fluticasone, HYDROcodone-acetaminophen, sodium chloride flush  Assessment/Plan: Acute on chronic systolic left heart failure CAD CABG COPD Type 2 DM ESRD Anemia of chronic disease Tobacco use disorder Alcohol use disorder Hyperuricemia  Increase activity.   LOS: 6 days    Dixie Dials  MD  07/24/2018, 6:02 PM

## 2018-07-24 NOTE — Care Management Important Message (Signed)
Important Message  Patient Details  Name: Kevin Mangel Sr. MRN: [id number] Date of Birth: 1946/10/08   Medicare Important Message Given:  Yes    Erenest Rasher, RN 07/24/2018, 2:39 PM

## 2018-07-24 NOTE — Telephone Encounter (Signed)
Spoke with pt. Mailed letter  09/04/18 2pm dialysis duplex 3pm p/o PA

## 2018-07-24 NOTE — Progress Notes (Signed)
   Daily Progress Note  No steal sx.  Intact motor in R hand.  +bruit, +faint thrill.    Radiology     Dg Chest Port 1 View  Result Date: 07/23/2018 CLINICAL DATA:  ESRD (end stage renal disease) on dialysis tdc placement EXAM: PORTABLE CHEST 1 VIEW COMPARISON:  Chest x-ray dated 07/18/2018. FINDINGS: New RIGHT-sided central catheter is in place with tip appropriately positioned at the expected level of the cavoatrial junction. Stable cardiomegaly. Stable patchy opacities at the RIGHT lung base, previously described as a chronic scarring, with small pleural effusion versus pleural thickening at the adjacent costophrenic angle. No new lung findings. No pneumothorax. IMPRESSION: 1. New central catheter appears well positioned with tip at the level of the RIGHT atrium. No pneumothorax. 2. Stable cardiomegaly. Electronically Signed   By: Franki Cabot M.D.   On: 07/23/2018 15:27   Dg Fluoro Guide Cv Line-no Report  Result Date: 07/23/2018 Fluoroscopy was utilized by the requesting physician.  No radiographic interpretation.     TDC in appropriate position without PTX  No steal s/p R BC AVF.  Pt has significant atherosclerosis in R brachial artery  HD per Renal  F/U in 6 weeks  Adele Barthel, MD, FACS [facility] Office: [phone] Pager: [phone]  07/24/2018, 7:27 AM

## 2018-07-25 ENCOUNTER — Encounter (HOSPITAL_COMMUNITY): Payer: Self-pay | Admitting: Vascular Surgery

## 2018-07-25 ENCOUNTER — Other Ambulatory Visit: Payer: Self-pay

## 2018-07-25 DIAGNOSIS — I739 Peripheral vascular disease, unspecified: Secondary | ICD-10-CM

## 2018-07-25 DIAGNOSIS — Z48812 Encounter for surgical aftercare following surgery on the circulatory system: Secondary | ICD-10-CM

## 2018-07-25 DIAGNOSIS — N185 Chronic kidney disease, stage 5: Secondary | ICD-10-CM

## 2018-07-25 LAB — CBC
HEMATOCRIT: 26 % — AB (ref 39.0–52.0)
HEMOGLOBIN: 8 g/dL — AB (ref 13.0–17.0)
MCH: 28.6 pg (ref 26.0–34.0)
MCHC: 30.8 g/dL (ref 30.0–36.0)
MCV: 92.9 fL (ref 78.0–100.0)
Platelets: 164 10*3/uL (ref 150–400)
RBC: 2.8 MIL/uL — ABNORMAL LOW (ref 4.22–5.81)
RDW: 16.3 % — AB (ref 11.5–15.5)
WBC: 6.8 10*3/uL (ref 4.0–10.5)

## 2018-07-25 LAB — RENAL FUNCTION PANEL
ANION GAP: 13 (ref 5–15)
Albumin: 3.5 g/dL (ref 3.5–5.0)
BUN: 94 mg/dL — AB (ref 8–23)
CHLORIDE: 93 mmol/L — AB (ref 98–111)
CO2: 28 mmol/L (ref 22–32)
Calcium: 8.8 mg/dL — ABNORMAL LOW (ref 8.9–10.3)
Creatinine, Ser: 7.82 mg/dL — ABNORMAL HIGH (ref 0.61–1.24)
GFR calc Af Amer: 7 mL/min — ABNORMAL LOW (ref >60)
GFR calc non Af Amer: 6 mL/min — ABNORMAL LOW (ref >60)
GLUCOSE: 229 mg/dL — AB (ref 70–99)
PHOSPHORUS: 5.8 mg/dL — AB (ref 2.5–4.6)
POTASSIUM: 4.4 mmol/L (ref 3.5–5.1)
Sodium: 134 mmol/L — ABNORMAL LOW (ref 135–145)

## 2018-07-25 MED ORDER — LIDOCAINE-PRILOCAINE 2.5-2.5 % EX CREA: 1.0000 "application " | TOPICAL_CREAM | CUTANEOUS | Status: DC | PRN

## 2018-07-25 MED ORDER — HEPARIN SODIUM (PORCINE) 1000 UNIT/ML DIALYSIS: 1000.0000 [IU] | INTRAMUSCULAR | Status: DC | PRN

## 2018-07-25 MED ORDER — CHLORHEXIDINE GLUCONATE 4 % EX LIQD
60.0000 mL | Freq: Every day | CUTANEOUS | Status: DC
  Filled 2018-07-25: qty 15

## 2018-07-25 MED ORDER — PENTAFLUOROPROP-TETRAFLUOROETH EX AERO: 1.0000 "application " | INHALATION_SPRAY | CUTANEOUS | Status: DC | PRN

## 2018-07-25 MED ORDER — LIDOCAINE HCL (PF) 1 % IJ SOLN: 5.0000 mL | INTRAMUSCULAR | Status: DC | PRN

## 2018-07-25 MED ORDER — SODIUM CHLORIDE 0.9 % IV SOLN: 100.0000 mL | INTRAVENOUS | Status: DC | PRN

## 2018-07-25 MED ORDER — ALTEPLASE 2 MG IJ SOLR: 2.0000 mg | Freq: Once | INTRAMUSCULAR | Status: DC | PRN

## 2018-07-25 MED ORDER — CHLORHEXIDINE GLUCONATE CLOTH 2 % EX PADS
6.0000 | MEDICATED_PAD | Freq: Every day | CUTANEOUS | Status: DC
  Administered 2018-07-25 – 2018-07-26 (×2): 6 via TOPICAL

## 2018-07-25 MED ORDER — NA FERRIC GLUC CPLX IN SUCROSE 12.5 MG/ML IV SOLN: 250.0000 mg | INTRAVENOUS | Status: DC

## 2018-07-25 NOTE — Consult Note (Signed)
Kevin Sinning Sr. Admit Date: 07/18/2018 07/25/2018 Rexene Agent Requesting Physician:  Terrence Dupont  Reason for Consult:  Hypervolemia, CKD20  HPI:  72 year old male in the hospital on 7/31  with progressive lower extremity edema and exertional dyspnea.  Past history includes chronic systolic heart failure, stage V CKD, COPD with ongoing tobacco use, atherosclerotic cardiovascular disease with history of CABG, hypertension, type 2 diabetes.    He follows with Dr. Justin Mend in our office and was last seen recently, per patient's recall.  At that time he was referred to vascular surgery evaluation of permanent access and need for tunneled dialysis catheter.  He tells me he was not intending to start dialysis right away, I do not have outpatient notes for review at the current time.  On 8/5 the patient underwent right IJ tunneled dialysis catheter and right brachiocephalic arteriovenous fistula.  His creatinine was 7.4 with a BUN of 77 at admission and now 7.8 and a BUN of 87 when last checked on 8/5.  No nausea, vomiting, dysgeusia, hiccups.  Continues to have peripheral edema but with high-dose Lasix and metolazone has improved his weights by 4 kg.  Patient has anemia with hemoglobin of 8.8.  Recent iron saturation was 18% with a ferritin of 159.  Looking through old notes he does have a history of proteinuria.  I discussed with the patient that renal function has shown some worsening with diuresis and urine output appears to have fallen off despite high-dose diuretics.  I recommended initiation of hemodialysis and transitioning to outpatient therapy.  Patient was in agreement.   Creat (mg/dL)  Date Value  05/28/2014 1.54 (H)  03/05/2014 1.43 (H)  12/30/2013 1.47 (H)  11/18/2013 1.32  11/11/2013 1.51 (H)   Creatinine, Ser (mg/dL)  Date Value  07/23/2018 7.84 (H)  07/22/2018 7.79 (H)  07/20/2018 7.56 (H)  07/19/2018 7.41 (H)  07/18/2018 7.41 (H)  07/11/2018 6.31 (H)  05/25/2017 4.04 (H)   09/21/2016 2.96 (H)  09/18/2016 3.30 (H)  09/17/2016 3.38 (H)  ] I/Os: I/O last 3 completed shifts: In: 46 [P.O.:770] Out: 1250 [Urine:1250]   ROS NSAIDS: no use IV Contrast no exposure TMP/SMX no exposure Hypotension no exposure Balance of 12 systems is negative w/ exceptions as above  PMH  Past Medical History:  Diagnosis Date  . Acute left systolic heart failure ([place]) 01/06/2016  . Adenoma of left adrenal gland 06/16/2014   CT Abdomen (06/04/2014): Incidental, 20 mm, 8.33 HU suggesting benign adenoma   . Alcohol abuse 02/15/2007   Reports consuming 1 pint of gin on weekends, does not drink every day. Denies history of seizures, blackouts, or tremors.    . Allergic rhinitis 04/09/2014  . Anemia 11/12/2013   Unclear cause as of yet (? EtOH abuse)   . Anemia of chronic kidney failure, stage 4 (severe) ([place]) 07/28/2017  . Benign prostatic hypertrophy 06/02/2014  . Chronic kidney disease, stage 3 ([place]) 11/12/2013  . Chronic systolic heart failure ([place]) 01/15/2016   Echo (01/07/2016): LVEF 35%  . COPD (chronic obstructive pulmonary disease) ([place])   . Coronary artery disease 02/15/2007   Non-STEMI 07/2005, s/p CABG x 4 (08/01/2005): LIMA to LAD, SVG to OM, RCA, PCA   . DVT (deep venous thrombosis) ([place]) 2006   RLE "when I had OHS"  . Erectile dysfunction associated with type 2 diabetes mellitus ([place]) 02/15/2007  . Essential hypertension 02/15/2007  . Hyperlipidemia 02/15/2007  . Major depression, single episode 11/02/2015  . Obesity (BMI 30.0-34.9) 02/15/2007  . Peripheral  vascular occlusive disease ([place]) 12/30/2013   ABI (01/03/2014): Right 0.64, Left 0.64   . Pneumonia X 1  . Secondary hyperparathyroidism of renal origin ([place]) 07/28/2017  . Sigmoid diverticulosis 01/15/2016   Minimal per colonoscopy 01/11/2016  . Tobacco abuse 02/15/2007  . Tubular adenoma of colon 01/11/2016   Two 6 mm semi-pedunculated tubular adenomas excised endoscopically 01/11/2016.  Repeat colonoscopy due 12/2020.   . Type 2 diabetes mellitus with mild nonproliferative diabetic retinopathy with macular edema, bilateral ([place]) 10/28/2016  . Type 2 diabetes mellitus with stage 3 chronic kidney disease ([place]) 02/15/2007  . Venous thromboembolism 11/11/2013   LE Dopplers (01/2011): RLE DVT. CTA with mild subacute to chronic right-sided pulmonary emboli.  Patient has elected to continue anticoagulation.    PSH  Past Surgical History:  Procedure Laterality Date  . AV FISTULA PLACEMENT  07/23/2018   Procedure: RIGHT UPPER ARM ARTERIOVENOUS (AV) BRACHIOCEPHALIC FISTULA CREATION;  Surgeon: Conrad Watkinsville, MD;  Location: [facility];  Service: Vascular;;  . CARDIAC CATHETERIZATION  07/2005  . CATARACT EXTRACTION W/ INTRAOCULAR LENS  IMPLANT, BILATERAL Bilateral 04/2014   Left 04/30/2014, Right 05/07/2014  . COLONOSCOPY WITH PROPOFOL N/A 01/11/2016   Procedure: COLONOSCOPY WITH PROPOFOL;  Surgeon: Ronald Lobo, MD;  Location: [facility];  Service: Endoscopy;  Laterality: N/A;  Needs MAC.  Marland Kitchen CORONARY ARTERY BYPASS GRAFT  08/01/2005   x 4, LIMA to LAD, SVG to OM, RCA, PCA  . ESOPHAGOGASTRODUODENOSCOPY N/A 01/08/2016   Procedure: ESOPHAGOGASTRODUODENOSCOPY (EGD);  Surgeon: Wilford Corner, MD;  Location[facility] ENDOSCOPY;  Service: Endoscopy;  Laterality: N/A;  . GIVENS CAPSULE STUDY N/A 01/12/2016   Procedure: GIVENS CAPSULE STUDY;  Surgeon: Ronald Lobo, MD;  Location: [facility];  Service: Endoscopy;  Laterality: N/A;  . INSERTION OF DIALYSIS CATHETER Right 07/23/2018   Procedure: INSERTION OF  DIALYSIS CATHETER - RIGHT INTERNAL JUGULAR PLACEMENT;  Surgeon: Conrad [place]ville, MD;  [facility];  Service: Vascular;  Laterality: Right;   FH  Family History  Problem Relation Age of Onset  . Heart disease Father   . COPD Father   . Diabetes Mellitus II Mother   . Healthy Sister   . Healthy Brother   . Healthy Daughter   . Healthy Son   . Healthy Sister   . Healthy Sister   . Healthy Sister   . Healthy Sister   . Healthy  Daughter   . Healthy Son   . Healthy Son   . Healthy Son   . Healthy Son   . Healthy Son   . Healthy Son    Kevin Castillo  reports that he has been smoking cigarettes.  He has a 18.00 pack-year smoking history. He has quit using smokeless tobacco. His smokeless tobacco use included snuff and chew. He reports that he drinks about 3.0 oz of alcohol per week. He reports that he does not use drugs. Allergies  Allergies  Allergen Reactions  . Nicotine Transdermal System [Nicotine] Other (See Comments)    Vivid dreams   Home medications Prior to Admission medications   Medication Sig Start Date End Date Taking? Authorizing Provider  albuterol (PROVENTIL HFA;VENTOLIN HFA) 108 (90 Base) MCG/ACT inhaler Inhale 1 puff into the lungs every 6 (six) hours as needed for wheezing or shortness of breath.   Yes [provider]  amLODipine (NORVASC) 10 MG tablet Take 1 tablet (10 mg total) by mouth daily. 03/02/18  Yes Oval Linsey, MD  atorvastatin (LIPITOR) 20 MG tablet Take 1 tablet (20 mg total)  by mouth daily. 03/02/18  Yes Oval Linsey, MD  calcitRIOL (ROCALTROL) 0.5 MCG capsule Take 0.5 mcg by mouth daily.   Yes [provider]  cyclobenzaprine (FLEXERIL) 5 MG tablet Take 1 tablet (5 mg total) by mouth 3 (three) times daily as needed for muscle spasms. 07/12/18  Yes Mosetta Anis, MD  finasteride (PROSCAR) 5 MG tablet Take 1 tablet (5 mg total) by mouth daily. 06/01/18  Yes Oval Linsey, MD  fluticasone (FLONASE) 50 MCG/ACT nasal spray Place 1 spray into both nostrils as needed for allergies or rhinitis.   Yes [provider]  labetalol (NORMODYNE) 200 MG tablet Take 200 mg by mouth 2 (two) times daily.   Yes [provider]  Multiple Vitamins-Minerals (MULTIVITAMIN ADULT) TABS Take 1 tablet by mouth every morning. 03/02/18  Yes Oval Linsey, MD  terazosin (HYTRIN) 10 MG capsule Take 1 capsule (10 mg total) by mouth at bedtime. 03/02/18  Yes Oval Linsey, MD   furosemide (LASIX) 80 MG tablet Take 2 tablets (160 mg total) by mouth 2 (two) times daily. 07/20/18   Oval Linsey, MD    Current Medications Scheduled Meds: . allopurinol  100 mg Oral Daily  . atorvastatin  20 mg Oral Daily  . bisacodyl  10 mg Rectal Once  . calcitRIOL  0.5 mcg Oral Daily  . Chlorhexidine Gluconate Cloth  6 each Topical Q0600  . finasteride  5 mg Oral Daily  . furosemide  80 mg Intravenous Q8H  . heparin  5,000 Units Subcutaneous Q8H  . labetalol  200 mg Oral BID  . lidocaine  1 patch Transdermal Daily  . metolazone  5 mg Oral Daily  . multivitamin with minerals  1 tablet Oral q morning - 10a  . sodium chloride flush  3 mL Intravenous Q12H  . terazosin  10 mg Oral QHS  . tiZANidine  2 mg Oral TID   Continuous Infusions: . sodium chloride    . sodium chloride 10 mL/hr at 07/23/18 1039  . [START ON 07/27/2018] ferric gluconate (FERRLECIT/NULECIT) IV     PRN Meds:.sodium chloride, albuterol, fluticasone, HYDROcodone-acetaminophen, sodium chloride flush  CBC Recent Labs  Lab 07/18/18 1720 07/19/18 0443 07/20/18 0426 07/23/18 0524  WBC 3.2* 3.0* 3.1* 3.1*  NEUTROABS 2.0  --   --   --   HGB 8.6* 7.8* 8.3* 8.8*  HCT 28.4* 25.5* 27.2* 28.6*  MCV 92.8 93.8 92.8 92.6  PLT 154 156 150 998   Basic Metabolic Panel Recent Labs  Lab 07/18/18 1720 07/19/18 0443 07/20/18 0426 07/22/18 1019 07/23/18 0524  NA 140 142 140 137 137  K 4.4 4.8 4.4 4.4 4.6  CL 105 105 103 97* 98  CO2 23 25 27 27 25   GLUCOSE 96 100* 111* 138* 115*  BUN 77* 77* 85* 83* 87*  CREATININE 7.41* 7.41* 7.56* 7.79* 7.84*  CALCIUM 8.8* 8.6* 8.6* 9.2 9.2    Physical Exam  Blood pressure (!) 140/55, pulse 69, temperature (!) 97.5 F (36.4 C), temperature source Oral, resp. rate 16, height 5' 7"  (1.702 m), weight 86.3 kg (190 lb 3.2 oz), SpO2 100 %. GEN: No acute distress, well-developed and well-appearing, lying flat in the bed with no supplemental oxygen, breathing comfortably. ENT:  NCAT EYES: EOMI CV: RRR, normal S1 and S2, no rub PULM: Crackles in the bases, wheezes throughout, normal work of breathing ABD: Soft, nontender SKIN: Right IJ tunneled dialysis catheter bandaged; surgical site of AV fistula intact EXT: Strong bruit and thrill with AV  fistula.  2+ pitting edema into the proximal legs   Assessment 62M new ESRD, hypervolemia, AoC sCHF exacerbation, Anemia of CKD with IDA.  1. New ESRD 1. S/p R IJ TDC and R BC AVF 07/23/18 with VVS 2. Hypervolemic and worseing renal function with diuretics 2. AoC sCHF exacerbation; improved somewhat 3. Anemia, TSAT 18%, needs Fe and ESA 4. HTN, stable BPs at current time 5. DM2 6. CAD, hx/o CABG  Plan 1. Start HD today: 2k, 2.5 Ca, use TDC, 1L UF 2. Check Labs 3. CLIP Pt 4. Check PTH 5. Start Fe Load 6. Start ESA 7. HD#2 tomorrow: staert 3K, 2L UF, 2.5h, Qb 300, use TDC 8. Daily weights, Daily Renal Panel, Strict I/Os, Avoid nephrotoxins (NSAIDs, judicious IV Contrast)    Pearson Grippe MD [phone] pgr 07/25/2018, 1:51 PM

## 2018-07-25 NOTE — Progress Notes (Signed)
Patient off floor to dialysis

## 2018-07-25 NOTE — Procedures (Signed)
I was present at this dialysis session. I have reviewed the session itself and made appropriate changes.   Tol HD. Qb 250. 1L UF goal, should achieve. CLIP started. Next HD tomrorow.  Filed Weights   07/23/18 0504 07/24/18 0605 07/25/18 1500  Weight: 86.9 kg (191 lb 9.6 oz) 86.3 kg (190 lb 3.2 oz) (P) 86.6 kg (190 lb 14.7 oz)    Recent Labs  Lab 07/23/18 0524  NA 137  K 4.6  CL 98  CO2 25  GLUCOSE 115*  BUN 87*  CREATININE 7.84*  CALCIUM 9.2    Recent Labs  Lab 07/18/18 1720  07/20/18 0426 07/23/18 0524 07/25/18 1525  WBC 3.2*   < > 3.1* 3.1* 6.8  NEUTROABS 2.0  --   --   --   --   HGB 8.6*   < > 8.3* 8.8* 8.0*  HCT 28.4*   < > 27.2* 28.6* 26.0*  MCV 92.8   < > 92.8 92.6 92.9  PLT 154   < > 150 154 164   < > = values in this interval not displayed.    Scheduled Meds: . allopurinol  100 mg Oral Daily  . atorvastatin  20 mg Oral Daily  . bisacodyl  10 mg Rectal Once  . calcitRIOL  0.5 mcg Oral Daily  . Chlorhexidine Gluconate Cloth  6 each Topical Q0600  . finasteride  5 mg Oral Daily  . furosemide  80 mg Intravenous Q8H  . heparin  5,000 Units Subcutaneous Q8H  . labetalol  200 mg Oral BID  . lidocaine  1 patch Transdermal Daily  . metolazone  5 mg Oral Daily  . multivitamin with minerals  1 tablet Oral q morning - 10a  . sodium chloride flush  3 mL Intravenous Q12H  . terazosin  10 mg Oral QHS  . tiZANidine  2 mg Oral TID   Continuous Infusions: . sodium chloride    . sodium chloride 10 mL/hr at 07/23/18 1039  . sodium chloride    . sodium chloride    . [START ON 07/27/2018] ferric gluconate (FERRLECIT/NULECIT) IV     PRN Meds:.sodium chloride, sodium chloride, sodium chloride, albuterol, alteplase, fluticasone, heparin, HYDROcodone-acetaminophen, lidocaine (PF), lidocaine-prilocaine, pentafluoroprop-tetrafluoroeth, sodium chloride flush   Pearson Grippe  MD 07/25/2018, 4:09 PM

## 2018-07-25 NOTE — Plan of Care (Signed)
  Problem: Education: Goal: Knowledge of General Education information will improve Description: Including pain rating scale, medication(s)/side effects and non-pharmacologic comfort measures Outcome: Progressing   Problem: Health Behavior/Discharge Planning: Goal: Ability to manage health-related needs will improve Outcome: Progressing   Problem: Clinical Measurements: Goal: Ability to maintain clinical measurements within normal limits will improve Outcome: Progressing Goal: Will remain free from infection Outcome: Progressing Goal: Diagnostic test results will improve Outcome: Progressing Goal: Respiratory complications will improve Outcome: Progressing Goal: Cardiovascular complication will be avoided Outcome: Progressing   Problem: Activity: Goal: Risk for activity intolerance will decrease Outcome: Progressing   Problem: Nutrition: Goal: Adequate nutrition will be maintained Outcome: Progressing   Problem: Coping: Goal: Level of anxiety will decrease Outcome: Progressing   Problem: Elimination: Goal: Will not experience complications related to bowel motility Outcome: Progressing Goal: Will not experience complications related to urinary retention Outcome: Progressing   Problem: Safety: Goal: Ability to remain free from injury will improve Outcome: Progressing   Problem: Skin Integrity: Goal: Risk for impaired skin integrity will decrease Outcome: Progressing   Problem: Education: Goal: Ability to demonstrate management of disease process will improve Outcome: Progressing Goal: Ability to verbalize understanding of medication therapies will improve Outcome: Progressing Goal: Individualized Educational Video(s) Outcome: Progressing   Problem: Activity: Goal: Capacity to carry out activities will improve Outcome: Progressing   Problem: Cardiac: Goal: Ability to achieve and maintain adequate cardiopulmonary perfusion will improve Outcome: Progressing    

## 2018-07-25 NOTE — Care Management Note (Signed)
Case Management Note  Patient Details  Name: Kevin Millea Sr. MRN: [id number] Date of Birth: 01-21-1946  Subjective/Objective:  ESRD-new HD, CHF, HTN, DM, started HD 8/7                   Action/Plan: NCM spoke to pt and wife. Pt was independent prior to hospital stay. He states he will pick up cane at [facility]. Did not want to use his DME benefit for cane. His wife will be available to assist at home as needed. Waiting CLIP completion for outpt Dialysis Center.   Expected Discharge Date:                  Expected Discharge Plan:  Home/Self Care  In-House Referral:  NA  Discharge planning Services  CM Consult  Post Acute Care Choice:  NA Choice offered to:  NA  DME Arranged:  N/A DME Agency:  NA  HH Arranged:  NA HH Agency:  NA  Status of Service:  Completed, signed off  If discussed at [place] of Stay Meetings, dates discussed:    Additional Comments:  Erenest Rasher, RN 07/25/2018, 5:14 PM

## 2018-07-26 LAB — HEPATITIS B SURFACE ANTIBODY,QUALITATIVE: Hep B S Ab: NONREACTIVE

## 2018-07-26 LAB — HEPATITIS B CORE ANTIBODY, TOTAL: Hep B Core Total Ab: NEGATIVE

## 2018-07-26 LAB — HEPATITIS B SURFACE ANTIGEN: Hepatitis B Surface Ag: NEGATIVE

## 2018-07-26 MED ORDER — SODIUM CHLORIDE 0.9 % IV SOLN: 250.0000 mL | INTRAVENOUS | 0 refills | Status: DC | PRN

## 2018-07-26 MED ORDER — SODIUM CHLORIDE 0.9 % IV SOLN: 100.0000 mL | INTRAVENOUS | 0 refills | Status: DC | PRN

## 2018-07-26 MED ORDER — MAGNESIUM CITRATE PO SOLN: 300.0000 mL | Freq: Once | ORAL | Status: DC

## 2018-07-26 MED ORDER — DARBEPOETIN ALFA 100 MCG/0.5ML IJ SOSY: 100.0000 ug | PREFILLED_SYRINGE | INTRAMUSCULAR | Status: DC

## 2018-07-26 MED ORDER — DARBEPOETIN ALFA 100 MCG/0.5ML IJ SOSY: 100.0000 ug | PREFILLED_SYRINGE | INTRAMUSCULAR | Status: AC

## 2018-07-26 MED ORDER — SODIUM CHLORIDE 0.9 % IV SOLN: 250.0000 mg | INTRAVENOUS | Status: AC

## 2018-07-26 MED ORDER — ALLOPURINOL 100 MG PO TABS: 100.0000 mg | ORAL_TABLET | Freq: Every day | ORAL | 3 refills | Status: AC

## 2018-07-26 MED ORDER — TIZANIDINE HCL 2 MG PO TABS: 2.0000 mg | ORAL_TABLET | Freq: Every day | ORAL | 0 refills | Status: DC

## 2018-07-26 MED ORDER — ALTEPLASE 2 MG IJ SOLR: 2.0000 mg | Freq: Once | INTRAMUSCULAR | Status: DC | PRN

## 2018-07-26 MED ORDER — DARBEPOETIN ALFA 100 MCG/0.5ML IJ SOSY
100.0000 ug | PREFILLED_SYRINGE | INTRAMUSCULAR | Status: DC
  Administered 2018-07-26: 100 ug via INTRAVENOUS
  Filled 2018-07-26: qty 0.5

## 2018-07-26 MED ORDER — LIDOCAINE-PRILOCAINE 2.5-2.5 % EX CREA: 1.0000 "application " | TOPICAL_CREAM | CUTANEOUS | Status: AC | PRN

## 2018-07-26 MED ORDER — PENTAFLUOROPROP-TETRAFLUOROETH EX AERO: 1.0000 "application " | INHALATION_SPRAY | CUTANEOUS | 0 refills | Status: AC | PRN

## 2018-07-26 MED ORDER — HEPARIN SODIUM (PORCINE) 1000 UNIT/ML DIALYSIS: 1000.0000 [IU] | INTRAMUSCULAR | Status: AC | PRN

## 2018-07-26 MED ORDER — DARBEPOETIN ALFA 100 MCG/0.5ML IJ SOSY
PREFILLED_SYRINGE | INTRAMUSCULAR | Status: AC
  Filled 2018-07-26: qty 0.5

## 2018-07-26 NOTE — Consult Note (Signed)
[facility] CM Primary Care Navigator  07/26/2018  Kevin Castillo Sr. 1946-02-06 [id number]   Met with patient and wife Posey Pronto) at the bedsideto identify possible discharge needs. Patientreports having "swelling to both legs, shortness of breath, pain to left side and back" that resulted to this admission. (acute on chronic left heart systolic failure, new ESRD- end stage renal disease, started on dialysis).  Maida Sale with [facility] ashisprimary [facility] provider.   [facility] [address] to obtain[facility]ications without any problem.   Patientstatesthat he has been managinghismedications at [facility] "pill box" system filled once a week.  Patient reports that he has been driving prior to admission but hiswife will be able to providetransportation tohisdoctors' appointmentsafter discharge.  [employer] transportation benefits discussed with patient and wife.  Patientliveswithwife and daughter Macky Lower) who will serve as hisprimary caregiver at home.   Anticipated discharge plan ishome per patient.  Patientand wife voiced understanding to call primary care provider's office whenhereturns home for a post discharge follow-up appointment within1- 2 weeksor sooner if needs arise.Patient letter (with PCP's contact number) was provided astheir reminder.  Explained topatientand wife regardingTHN CM services available for health management and resourcesat home.  Patient had previously received services from [facility] for HF management and reports still having the folder of educational materials at home. He admits to not regularly checking weights nor recording but encouraged to do so.  Patient had slow and steady diuresis with 80 mg of IV Lasix given 3 times a day and improved diuresis with zaroxolyn use and finally hemodialysis. His leg edema had improved.   Patient  would only opt and verbally agree with EMMI HF callsto follow-up with hisrecovery at home. Referral was made for EM[facility] HFcalls after discharge.  Patient and wife expressed understanding to seekreferral to [facility] care managementfrom primary care provider if deemed necessary forfurtherservicesin the future.   [facility] care management information provided for future needs that may arise.  Primary care provider's office is listed as providing transition of care (TOC) follow-up.    For additional questions please contact:  Edwena Felty A. Willson Lipa, BSN, RN-BC [facility] PRIMARY CARE Navigator Cell: [phone]

## 2018-07-26 NOTE — Discharge Instructions (Signed)
Dialysis Diet Dialysis is a treatment that cleans your blood. It is used when the kidneys are damaged. When you need dialysis, you should watch your diet. This is because some nutrients can build up in your blood between treatments and make you sick. These nutrients are:  Potassium.  Phosphorus.  Sodium.  Your doctor or dietitian will:  Tell you how much of these you can have.  Tell you if you need to look out for other nutrients too.  Help you plan meals.  Tell you how much to drink each day.  What do I need to know about this diet?  Limit potassium. Potassium is in milk, fruits, and vegetables.  Limit phosphorus. Phosphorus is in milk, cheese, beans, nuts, and carbonated beverages.  Limit salt (sodium). Foods that have a lot sodium include processed and cured meats, ready-made frozen meals, canned vegetables, and salty snack foods.  Do not use salt substitutes.  Try not to eat whole-grain foods and foods that have a lot of fiber.  Follow your doctor's instructions about how much to drink. You may be told to: ? Write down what you drink. ? Write down foods you eat that are made mostly from water, such as gelatin and soups. ? Drink from small cups.  Ask your doctor if you should take a medicine that binds phosphorus.  Take vitamin and mineral supplements only as told by your doctor.  Eat meat, poultry, fish, and eggs. Limit nuts and beans.  Before you cook potatoes, cut them into small pieces. Then boil them in unsalted water.  Drain all fluid from cooked vegetables and canned fruits before you eat them. What foods can I eat? Grains White bread. White rice. Cooked cereal. Unsalted popcorn. Tortillas. Pasta. Vegetables Fresh or frozen broccoli, carrots, and green beans. Cabbage. Cauliflower. Celery. Cucumbers. Eggplant. Radishes. Zucchini. Fruits Apples. Fresh or frozen berries. Fresh or canned pears, peaches, and pineapple. Grapes. Plums. Meats and Other Protein  Sources Fresh or frozen beef, pork, chicken, and fish. Eggs. Low-sodium canned tuna or salmon. Dairy Cream cheese. Heavy cream. Ricotta cheese. Beverages Apple cider. Cranberry juice. Grape juice. Lemonade. Black coffee. Condiments Herbs. Spices. Jam and jelly. Honey. Sweets and Desserts Sherbet. Cakes. Cookies. Fats and Oils Olive oil, canola oil, and safflower oil. Other Non-dairy creamer. Non-dairy whipped topping. Homemade broth without salt. The items listed above may not be a complete list of recommended foods or beverages. Contact your dietitian for more options. What foods are not recommended? Grains Whole-grain bread. Whole-grain pasta. High-fiber cereal. Vegetables Potatoes. Beets. Tomatoes. Winter squash and pumpkin. Asparagus. Spinach. Parsnips. Fruits Star fruit. Bananas. Oranges. Kiwi. Nectarines. Prunes. Melon. Dried fruit. Avocado. Meats and Other Protein Sources Canned, smoked, and cured meats. Soil scientist. Sardines. Nuts and seeds. Peanut butter. Beans and legumes. Dairy Milk. Buttermilk. Yogurt. Cheese and cottage cheese. Processed cheese spreads. Beverages Orange juice. Prune juice. Carbonated soft drinks. Condiments Salt. Salt substitutes. Soy sauce. Sweets and Desserts Ice cream. Chocolate. Candied nuts. Fats and Oils Butter. Margarine. Other Ready-made frozen meals. Canned soups. The items listed above may not be a complete list of foods and beverages to avoid. Contact your dietitian for more information. This information is not intended to replace advice given to you by your health care provider. Make sure you discuss any questions you have with your health care provider. Document Released: 06/05/2012 Document Revised: 05/12/2016 Document Reviewed: 07/08/2014 Elsevier Interactive Patient Education  2018 [place].    Heart Failure Heart failure means your heart  has trouble pumping blood. This makes it hard for your body to work well.  Heart failure is usually a long-term (chronic) condition. You must take good care of yourself and follow your doctor's treatment plan. Follow these instructions at home:  Take your heart medicine as told by your doctor. ? Do not stop taking medicine unless your doctor tells you to. ? Do not skip any dose of medicine. ? Refill your medicines before they run out. ? Take other medicines only as told by your doctor or pharmacist.  Stay active if told by your doctor. The elderly and people with severe heart failure should talk with a doctor about physical activity.  Eat heart-healthy foods. Choose foods that are without trans fat and are low in saturated fat, cholesterol, and salt (sodium). This includes fresh or frozen fruits and vegetables, fish, lean meats, fat-free or low-fat dairy foods, whole grains, and high-fiber foods. Lentils and dried peas and beans (legumes) are also good choices.  Limit salt if told by your doctor.  Cook in a healthy way. Roast, grill, broil, bake, poach, steam, or stir-fry foods.  Limit fluids as told by your doctor.  Weigh yourself every morning. Do this after you pee (urinate) and before you eat breakfast. Write down your weight to give to your doctor.  Take your blood pressure and write it down if your doctor tells you to.  Ask your doctor how to check your pulse. Check your pulse as told.  Lose weight if told by your doctor.  Stop smoking or chewing tobacco. Do not use gum or patches that help you quit without your doctor's approval.  Schedule and go to doctor visits as told.  Nonpregnant women should have no more than 1 drink a day. Men should have no more than 2 drinks a day. Talk to your doctor about drinking alcohol.  Stop illegal drug use.  Stay current with shots (immunizations).  Manage your health conditions as told by your doctor.  Learn to manage your stress.  Rest when you are tired.  If it is really hot outside: ? Avoid intense  activities. ? Use air conditioning or fans, or get in a cooler place. ? Avoid caffeine and alcohol. ? Wear loose-fitting, lightweight, and light-colored clothing.  If it is really cold outside: ? Avoid intense activities. ? Layer your clothing. ? Wear mittens or gloves, a hat, and a scarf when going outside. ? Avoid alcohol.  Learn about heart failure and get support as needed.  Get help to maintain or improve your quality of life and your ability to care for yourself as needed. Contact a doctor if:  You gain weight quickly.  You are more short of breath than usual.  You cannot do your normal activities.  You tire easily.  You cough more than normal, especially with activity.  You have any or more puffiness (swelling) in areas such as your hands, feet, ankles, or belly (abdomen).  You cannot sleep because it is hard to breathe.  You feel like your heart is beating fast (palpitations).  You get dizzy or light-headed when you stand up. Get help right away if:  You have trouble breathing.  There is a change in mental status, such as becoming less alert or not being able to focus.  You have chest pain or discomfort.  You faint. This information is not intended to replace advice given to you by your health care provider. Make sure you discuss any questions you have with  your health care provider. Document Released: 09/13/2008 Document Revised: 05/12/2016 Document Reviewed: 01/21/2013 Elsevier Interactive Patient Education  2017 [place].  Dialysis Dialysis is a procedure that replaces some of the work healthy kidneys do. It is done when you lose about 85-90% of your kidney function. It may also be done earlier if your symptoms may be improved by dialysis. During dialysis, wastes, salt, and extra water are removed from the blood, and the levels of certain chemicals in the blood (such as potassium) are maintained. Dialysis is done in sessions. Dialysis sessions are  continued until the kidneys get better. If the kidneys cannot get better, such as in end-stage kidney disease, dialysis is continued for life or until you receive a new kidney (kidney transplant). There are two types of dialysis: hemodialysis and peritoneal dialysis. What is hemodialysis? Hemodialysis is a type of dialysis in which a machine called a dialyzer is used to filter the blood. Before beginning hemodialysis, you will have surgery to create a site where blood can be removed from the body and returned to the body (vascular access). There are three types of vascular accesses:  Arteriovenous fistula. To create this type of access, an artery is connected to a vein (usually in the arm). A fistula takes 1-6 months to develop after surgery. If it develops properly, it usually lasts longer than the other types of vascular accesses. It is also less likely to become infected and cause blood clots.  Arteriovenous graft. To create this type of access, an artery and a vein in the arm are connected with a tube. A graft may be used within 2-3 weeks of surgery.  A venous catheter. To create this type of access, a thin, flexible tube (catheter) is placed in a large vein in your neck, chest, or groin. A catheter may be used right away. It is usually used as a temporary access when dialysis needs to begin immediately.  During hemodialysis, blood leaves the body through your access. It travels through a tube to the dialyzer, where it is filtered. The blood then returns to your body through another tube. Hemodialysis is usually performed by a health care provider at a hospital or dialysis center three times a week. Visits last about 3-4 hours. It may also be performed with the help of another person at home with training. What is peritoneal dialysis? Peritoneal dialysis is a type of dialysis in which the thin lining of the abdomen (peritoneum) is used as a filter. Before beginning peritoneal dialysis, you will have  surgery to place a catheter in your abdomen. The catheter will be used to transfer a fluid called dialysate to and from your abdomen. At the start of a session, your abdomen is filled with dialysate. During the session, wastes, salt, and extra water in the blood pass through the peritoneum and into the dialysate. The dialysate is drained from the body at the end of the session. The process of filling and draining the dialysate is called an exchange. Exchanges are repeated until you have used up all the dialysate for the day. Peritoneal dialysis may be performed by you at home or at almost any other location. It is done every day. You may need up to five exchanges a day. The amount of time the dialysate is in your body between exchanges is called a dwell. The dwell depends on the number of exchanges needed and the characteristics of the peritoneum. It usually varies from 1.5-3 hours. You may go about your day  normally between exchanges. Alternately, the exchanges may be done at night while you sleep, using a machine called a cycler. Which type of dialysis should I choose? Both hemodialysis and peritoneal dialysis have advantages and disadvantages. Talk to your health care provider about which type of dialysis would be best for you. Your lifestyle and preferences should be considered along with your medical condition. In some cases, only one type of dialysis may be an option. Advantages of hemodialysis  It is done less often than peritoneal dialysis.  Someone else can do the dialysis for you.  If you go to a dialysis center, your health care provider will be able to recognize any problems right away.  If you go to a dialysis center, you can interact with others who are having dialysis. This can provide you with emotional support.  Disadvantages of hemodialysis  Hemodialysis may cause cramps and low blood pressure. It may leave you feeling tired on the days you have the treatment.  If you go to a  dialysis center, you will need to make weekly appointments and work around the centers schedule.  You will need to take extra care when traveling. If you go to a dialysis center, you will need to make special arrangements to visit a dialysis center near your destination. If you are having treatments at home, you will need to take the dialyzer with you to your destination.  You will need to avoid more foods than you would need to avoid on peritoneal dialysis.  Advantages of peritoneal dialysis  It is less likely than hemodialysis to cause cramps and low blood pressure.  You may do exchanges on your own wherever you are, including when you travel.  You do not need to avoid as many foods as you do on hemodialysis.  Disadvantages of peritoneal dialysis  It is done more often than hemodialysis.  Performing peritoneal dialysis requires you to have dexterity of the hands. You must also be able to lift bags.  You will have to learn sterilization techniques. You will need to practice them every day to reduce the risk of infection.  What changes will I need to make to my diet during dialysis? Both hemodialysis and peritoneal dialysis require you to make some changes to your diet. For example, you will need to limit your intake of foods high in the minerals phosphorus and potassium. You will also need to limit your fluid intake. Your dietitian can help you plan meals. A good meal plan can improve your dialysis and your health. What should I expect when beginning dialysis? Adjusting to the dialysis treatment, schedule, and diet can take some time. You may need to stop working and may not be able to do some of the things you normally do. You may feel anxious or depressed when beginning dialysis. Eventually, many people feel better overall because of dialysis. Some people are able to return to work after making some changes, such as reducing work intensity. Where can I find more information?  [place]: www.kidney.org  American Association of Kidney Patients: BombTimer.gl  [employer]: www.kidneyfund.org This information is not intended to replace advice given to you by your health care provider. Make sure you discuss any questions you have with your health care provider. Document Released: 02/25/2003 Document Revised: 05/12/2016 Document Reviewed: 01/29/2013 Elsevier Interactive Patient Education  2017 [employer].

## 2018-07-26 NOTE — Final Progress Note (Signed)
Pt's IV taken out and pt taken off of telemetry. Discharge information has been reviewed and given to pt. Pt will be taken down by this nurse and wife will take patient home. Will discontinue any further care.

## 2018-07-26 NOTE — Progress Notes (Signed)
Ref: Oval Linsey, MD   Subjective:  Had hemodialysis. Overall feels little better.  Objective:  Vital Signs in the last 24 hours: Temp:  [98.1 F (36.7 C)-98.9 F (37.2 C)] 98.2 F (36.8 C) (08/08 1728) Pulse Rate:  [59-71] 70 (08/08 1728) Cardiac Rhythm: Normal sinus rhythm;Heart block (08/08 1740) Resp:  [16-27] 19 (08/08 1728) BP: (114-183)/(57-82) 172/81 (08/08 1728) SpO2:  [91 %-96 %] 93 % (08/08 1728) Weight:  [83.2 kg-84.1 kg] 84.1 kg (08/08 1728)  Physical Exam: BP Readings from Last 1 Encounters:  07/26/18 (!) 172/81     Wt Readings from Last 1 Encounters:  07/26/18 84.1 kg    Weight change:  Body mass index is 29.04 kg/m. HEENT: [place]/AT, Eyes-Brown, Conjunctiva-Pale, Sclera-Non-icteric Neck: No JVD, No bruit, Trachea midline. Lungs:  Clear, Bilateral. Cardiac:  Regular rhythm, normal S1 and S2, no S3. II/VI systolic murmur. Abdomen:  Soft, non-tender. BS present. Extremities:  2 + edema present. No cyanosis. No clubbing. CNS: AxOx3, Cranial nerves grossly intact, moves all 4 extremities.  Skin: Warm and dry.   Intake/Output from previous day: 08/07 0701 - 08/08 0700 In: 720 [P.O.:720] Out: 2225 [Urine:1225]    Lab Results: BMET    Component Value Date/Time   NA 134 (L) 07/25/2018 1525   NA 137 07/23/2018 0524   NA 137 07/22/2018 1019   NA 144 07/11/2018 1637   NA 143 05/25/2017 1205   NA 142 08/05/2016 1115   K 4.4 07/25/2018 1525   K 4.6 07/23/2018 0524   K 4.4 07/22/2018 1019   CL 93 (L) 07/25/2018 1525   CL 98 07/23/2018 0524   CL 97 (L) 07/22/2018 1019   CO2 28 07/25/2018 1525   CO2 25 07/23/2018 0524   CO2 27 07/22/2018 1019   GLUCOSE 229 (H) 07/25/2018 1525   GLUCOSE 115 (H) 07/23/2018 0524   GLUCOSE 138 (H) 07/22/2018 1019   BUN 94 (H) 07/25/2018 1525   BUN 87 (H) 07/23/2018 0524   BUN 83 (H) 07/22/2018 1019   BUN 56 (H) 07/11/2018 1637   BUN 37 (H) 05/25/2017 1205   BUN 25 08/05/2016 1115   CREATININE 7.82 (H) 07/25/2018 1525    CREATININE 7.84 (H) 07/23/2018 0524   CREATININE 7.79 (H) 07/22/2018 1019   CREATININE 1.54 (H) 05/28/2014 1046   CREATININE 1.43 (H) 03/05/2014 1532   CREATININE 1.47 (H) 12/30/2013 1532   CALCIUM 8.8 (L) 07/25/2018 1525   CALCIUM 9.2 07/23/2018 0524   CALCIUM 9.2 07/22/2018 1019   GFRNONAA 6 (L) 07/25/2018 1525   GFRNONAA 6 (L) 07/23/2018 0524   GFRNONAA 6 (L) 07/22/2018 1019   GFRNONAA 46 (L) 05/28/2014 1046   GFRNONAA 50 (L) 03/05/2014 1532   GFRNONAA 55 (L) 11/18/2013 1101   GFRAA 7 (L) 07/25/2018 1525   GFRAA 7 (L) 07/23/2018 0524   GFRAA 7 (L) 07/22/2018 1019   GFRAA 53 (L) 05/28/2014 1046   GFRAA 58 (L) 03/05/2014 1532   GFRAA 64 11/18/2013 1101   CBC    Component Value Date/Time   WBC 6.8 07/25/2018 1525   RBC 2.80 (L) 07/25/2018 1525   HGB 8.0 (L) 07/25/2018 1525   HGB 8.9 (L) 07/11/2018 1637   HCT 26.0 (L) 07/25/2018 1525   HCT 27.8 (L) 07/11/2018 1637   PLT 164 07/25/2018 1525   PLT 122 (L) 07/11/2018 1637   MCV 92.9 07/25/2018 1525   MCV 88 07/11/2018 1637   MCH 28.6 07/25/2018 1525   MCHC 30.8 07/25/2018 1525  RDW 16.3 (H) 07/25/2018 1525   RDW 18.2 (H) 07/11/2018 1637   LYMPHSABS 0.9 07/18/2018 1720   LYMPHSABS 1.4 08/05/2016 1115   MONOABS 0.3 07/18/2018 1720   EOSABS 0.0 07/18/2018 1720   EOSABS 0.0 08/05/2016 1115   BASOSABS 0.0 07/18/2018 1720   BASOSABS 0.0 08/05/2016 1115   HEPATIC Function Panel Recent Labs    07/11/18 1637 07/18/18 1720  PROT 7.3 7.9   HEMOGLOBIN A1C No components found for: HGA1C,  MPG CARDIAC ENZYMES Lab Results  Component Value Date   CKTOTAL 64 05/26/2009   CKMB 0.7 05/26/2009   TROPONINI 0.03 (HH) 07/19/2018   TROPONINI 0.03 (HH) 07/18/2018   TROPONINI 0.03 (HH) 07/18/2018   BNP No results for input(s): PROBNP in the last 8760 hours. TSH Recent Labs    07/18/18 1720  TSH 2.379   CHOLESTEROL Recent Labs    07/20/18 0426  CHOL 155    Scheduled Meds: . allopurinol  100 mg Oral Daily  .  atorvastatin  20 mg Oral Daily  . bisacodyl  10 mg Rectal Once  . calcitRIOL  0.5 mcg Oral Daily  . Chlorhexidine Gluconate Cloth  6 each Topical Q0600  . Darbepoetin Alfa      . darbepoetin (ARANESP) injection - DIALYSIS  100 mcg Intravenous Q Thu-HD  . finasteride  5 mg Oral Daily  . heparin  5,000 Units Subcutaneous Q8H  . labetalol  200 mg Oral BID  . lidocaine  1 patch Transdermal Daily  . multivitamin with minerals  1 tablet Oral q morning - 10a  . sodium chloride flush  3 mL Intravenous Q12H  . terazosin  10 mg Oral QHS  . tiZANidine  2 mg Oral TID   Continuous Infusions: . sodium chloride    . sodium chloride 10 mL/hr at 07/23/18 1039  . sodium chloride    . sodium chloride    . [START ON 07/27/2018] ferric gluconate (FERRLECIT/NULECIT) IV     PRN Meds:.sodium chloride, sodium chloride, sodium chloride, albuterol, alteplase, fluticasone, heparin, HYDROcodone-acetaminophen, lidocaine (PF), lidocaine-prilocaine, pentafluoroprop-tetrafluoroeth, sodium chloride flush  Assessment/Plan: Acute on chronic systolic left heart failure CAD CABG Type 2 DM ESRD Anemia of chronic disease Tobacco use disorder Alcohol use disorder Hyperuricemia  Increase activity.   LOS: 7 days   Dixie Dials  MD  07/26/2018, 7:53 PM

## 2018-07-26 NOTE — Discharge Summary (Signed)
Physician Discharge Summary  Patient ID: Kevin Ortwein Sr. MRN: [id number] DOB/AGE: 72-Mar-1947 72 y.o.  Admit date: 07/18/2018 Discharge date: 07/26/2018  Admission Diagnoses: Acute on chronic left heart systolic failure CAD CABG COPD Type 2 DM CKD, IV Chronic anemia, blood loss, GI bleed, chronic, Chronic disease Tobacco use disorder Alcohol use disorder  Discharge Diagnoses:  Principal Problem:   Acute on chronic systolic left heart failure (HCC) Active Problems:   Essential hypertension   Type 2 diabetes mellitus with stage 5 chronic kidney disease (HCC)   Hyperlipidemia   Obesity (BMI 30.0-34.9)   Alcohol abuse   Tobacco abuse   COPD   Anemia of chronic kidney failure, stage 5 (HCC)   Secondary hyperparathyroidism of renal origin ([place])   Hyperuricemia   Back pain  Discharged Condition: fair  Hospital Course: 72 year old male with 3 month history of progressive worsening of leg edema had shortness of breath with activity. He has PMH of chronic systolic left heart failure, Anemia of GI blood loss and chronic disease, CKD stage IV. COPD, CAD, CABG, Hypertension, Type 2 DM, Tobacco use disorder, alcohol use disorder and obesity. He had slow and steady diuresis with 80 mg. of IV Lasix given as 3 times a day and improved diuresis with zaroxolyn use and finally hemodialysis post [facility] catheter placed by Vascular surgeon, Dr. Bridgett Larsson. He also had Right BC AV fistula.  His leg edema decreased 70 to 80 % at the time of discharge.  His left lumbar pain improved post dialysis. He understood to give up extra fluid intake like he used to do prior to hospital admission and quit smoking and alcohol intake ASAP/ He will see me in 1 week, primary care doctor in 1 month and vascular doctor in 6 weeks.  Consults: cardiology, nephrology and vascular surgery  Significant Diagnostic Studies: labs: Low Hgb of 8.6, WBC count of 3.2 and normal platelets count. BMET near normal except elevated BUN of  77 and creatinine of 7.41. Lipid panel was normal with LDL cholesterol of 88 mg.  Uric acid level was 9.5. Iron level was normal. Troponin I was 0.03 ng. BNP was 960.9 pg. Hgb A1C was 5.7 %. TSH was normal at 2.379 mIU.  CXR: Cardiomegaly.  X-ray abdomen: Unremarkable.  Echocardiogram on 07/19/2018: Mild to moderate LV and RV systolic dysfunction  LV EF 40-45 %. Moderate MR and TR. MILD AS  Treatments: IV lasix and dialysis: Hemodialysis. TDC catheter placement with right BC AV fistula.  Discharge Exam: Blood pressure (!) 172/81, pulse 70, temperature 98.2 F (36.8 C), temperature source Oral, resp. rate 19, height 5\' 7"  (1.702 m), weight 84.1 kg, SpO2 93 %. General appearance: alert, cooperative and appears stated age. Head: Normocephalic, atraumatic. Eyes: Brown eyes, pale conjunctiva, corneas clear. PERRL, EOM's intact.  Neck: No adenopathy, no carotid bruit, no JVD, supple, symmetrical, trachea midline and thyroid not enlarged. Resp: Clear to auscultation bilaterally. Cardio: Regular rate and rhythm, S1, S2 normal, II/VI systolic murmur, no click, rub or gallop. GI: Soft, non-tender; bowel sounds normal; no organomegaly. Extremities: 2 + lower leg edema, cyanosis or clubbing. Skin: Warm and dry.  Neurologic: Alert and oriented X 3, normal strength and tone. Normal coordination and gait.  Disposition: Discharge disposition: 01-Home or Self Care        Allergies as of 07/26/2018      Reactions   Nicotine Transdermal System [nicotine] Other (See Comments)   Vivid dreams      Medication List  STOP taking these medications   amLODipine 10 MG tablet Commonly known as:  NORVASC   cyclobenzaprine 5 MG tablet Commonly known as:  FLEXERIL   furosemide 80 MG tablet Commonly known as:  LASIX   HYDROcodone-acetaminophen 5-325 MG tablet Commonly known as:  NORCO/VICODIN     TAKE these medications   albuterol 108 (90 Base) MCG/ACT inhaler Commonly known as:  PROVENTIL  HFA;VENTOLIN HFA Inhale 1 puff into the lungs every 6 (six) hours as needed for wheezing or shortness of breath.   allopurinol 100 MG tablet Commonly known as:  ZYLOPRIM Take 1 tablet (100 mg total) by mouth daily. Start taking on:  07/27/2018   alteplase 2 MG injection Commonly known as:  CATHFLO ACTIVASE 2 mg by Intracatheter route once as needed for open catheter.   atorvastatin 20 MG tablet Commonly known as:  LIPITOR Take 1 tablet (20 mg total) by mouth daily.   calcitRIOL 0.5 MCG capsule Commonly known as:  ROCALTROL Take 0.5 mcg by mouth daily.   Darbepoetin Alfa 100 MCG/0.5ML Sosy injection Commonly known as:  ARANESP Inject 0.5 mLs (100 mcg total) into the vein every Thursday with hemodialysis. Start taking on:  08/02/2018   ferric gluconate 250 mg in sodium chloride 0.9 % 100 mL Inject 250 mg into the vein every Monday, Wednesday, and Friday with hemodialysis. Start taking on:  07/27/2018   finasteride 5 MG tablet Commonly known as:  PROSCAR Take 1 tablet (5 mg total) by mouth daily.   fluticasone 50 MCG/ACT nasal spray Commonly known as:  FLONASE Place 1 spray into both nostrils as needed for allergies or rhinitis.   heparin 1000 unit/mL Soln injection 1 mL (1,000 Units total) by Dialysis route as needed (in dialysis).   labetalol 200 MG tablet Commonly known as:  NORMODYNE Take 200 mg by mouth 2 (two) times daily.   lidocaine-prilocaine cream Commonly known as:  EMLA Apply 1 application topically as needed (topical anesthesia for hemodialysis if Gebauers and Lidocaine injection are ineffective.).   MULTIVITAMIN ADULT Tabs Take 1 tablet by mouth every morning.   pentafluoroprop-tetrafluoroeth Aero Commonly known as:  GEBAUERS Apply 1 application topically as needed (topical anesthesia for hemodialysis).   sodium chloride 0.9 % infusion Inject 250 mLs into the vein as needed (for IV line care(Saline / Heparin Lock)).   sodium chloride 0.9 %  infusion Inject 100 mLs into the vein as needed (symptomatic hypotension or decrease in SBP < 90 mmHg).   sodium chloride 0.9 % infusion Inject 100 mLs into the vein as needed (severe cramping).   terazosin 10 MG capsule Commonly known as:  HYTRIN Take 1 capsule (10 mg total) by mouth at bedtime.   tiZANidine 2 MG tablet Commonly known as:  ZANAFLEX Take 1 tablet (2 mg total) by mouth at bedtime.      Follow-up Information    Oval Linsey, MD. Schedule an appointment as soon as possible for a visit in 1 month(s).   Specialty:  Internal Medicine Contact information: [address] [place] [address] [phone]        Dixie Dials, MD. Schedule an appointment as soon as possible for a visit in 1 week(s).   Specialty:  Cardiology Contact information: [place] [address] [phone]           Signed: Birdie Riddle 07/26/2018, 6:48 PM

## 2018-07-26 NOTE — Progress Notes (Signed)
Admit: 07/18/2018 LOS: 8  22M new ESRD, hypervolemia, AoC sCHF exacerbation, Anemia of CKD with IDA.  Subjective:  . HD#1 yesterday, 1L UF, tol well w/o issues . No new events . For HD#2 today . CLIP in progress   08/07 0701 - 08/08 0700 In: 720 [P.O.:720] Out: 2225 [Urine:1225]  Filed Weights   07/25/18 1500 07/25/18 1700 07/26/18 0612  Weight: 86.6 kg 85.5 kg 83.2 kg    Scheduled Meds: . allopurinol  100 mg Oral Daily  . atorvastatin  20 mg Oral Daily  . bisacodyl  10 mg Rectal Once  . calcitRIOL  0.5 mcg Oral Daily  . Chlorhexidine Gluconate Cloth  6 each Topical Q0600  . finasteride  5 mg Oral Daily  . heparin  5,000 Units Subcutaneous Q8H  . labetalol  200 mg Oral BID  . lidocaine  1 patch Transdermal Daily  . multivitamin with minerals  1 tablet Oral q morning - 10a  . sodium chloride flush  3 mL Intravenous Q12H  . terazosin  10 mg Oral QHS  . tiZANidine  2 mg Oral TID   Continuous Infusions: . sodium chloride    . sodium chloride 10 mL/hr at 07/23/18 1039  . sodium chloride    . sodium chloride    . [START ON 07/27/2018] ferric gluconate (FERRLECIT/NULECIT) IV     PRN Meds:.sodium chloride, sodium chloride, sodium chloride, albuterol, alteplase, fluticasone, heparin, HYDROcodone-acetaminophen, lidocaine (PF), lidocaine-prilocaine, pentafluoroprop-tetrafluoroeth, sodium chloride flush  Current Labs: reviewed    Physical Exam:  Blood pressure (!) 141/57, pulse 71, temperature 98.9 F (37.2 C), temperature source Oral, resp. rate 16, height 5\' 7"  (1.702 m), weight 83.2 kg, SpO2 91 %. GEN: No acute distress, well-developed and well-appearing, lying flat in the bed with no supplemental oxygen, breathing comfortably. ENT: NCAT EYES: EOMI CV: RRR, normal S1 and S2, no rub PULM: Crackles in the bases, wheezes throughout, normal work of breathing ABD: Soft, nontender SKIN: Right IJ tunneled dialysis catheter bandaged; surgical site of AV fistula intact EXT: Strong  bruit and thrill with AV fistula.  1+ pitting edema into the proximal legs   A 1. New ESRD 1. S/p R IJ TDC and R BC AVF 07/23/18 with VVS 2. Hypervolemic and worseing renal function with diuretics 3. HD start 07/25/18 2. AoC sCHF exacerbation; improved somewhat 3. Anemia, TSAT 18%, started Fe and ESA 4. HTN, stable BPs at current time 5. DM2 6. CAD, hx/o CABG 7. CKD-BMD: P close will follow binder can be started as outpt, do not continue home calcitriol upon discharge will be given at HD unit, PTH pending  P . HD#2 today, #3 tomorrow: 2L UF, 3h, 2K bath, TDC, no heparin . F/u on CLIP . Medication Issues; o Preferred narcotic agents for pain control are hydromorphone, fentanyl, and methadone. Morphine should not be used.  o Baclofen should be avoided o Avoid oral sodium phosphate and magnesium citrate based laxatives / bowel preps    Pearson Grippe MD 07/26/2018, 12:22 PM  Recent Labs  Lab 07/22/18 1019 07/23/18 0524 07/25/18 1525  NA 137 137 134*  K 4.4 4.6 4.4  CL 97* 98 93*  CO2 27 25 28   GLUCOSE 138* 115* 229*  BUN 83* 87* 94*  CREATININE 7.79* 7.84* 7.82*  CALCIUM 9.2 9.2 8.8*  PHOS  --   --  5.8*   Recent Labs  Lab 07/20/18 0426 07/23/18 0524 07/25/18 1525  WBC 3.1* 3.1* 6.8  HGB 8.3* 8.8* 8.0*  HCT 27.2*  28.6* 26.0*  MCV 92.8 92.6 92.9  PLT 150 154 164

## 2018-07-26 NOTE — Progress Notes (Signed)
Accepted at [facility] [address] .1st treatment is : Friday August ,09,2019 at 11:00 am .Schedule WL:[id number] .Chair time at 11:40am .2nd shift

## 2018-07-26 NOTE — Plan of Care (Signed)
  Problem: Education: Goal: Knowledge of General Education information will improve Description Including pain rating scale, medication(s)/side effects and non-pharmacologic comfort measures Outcome: Progressing   Problem: Health Behavior/Discharge Planning: Goal: Ability to manage health-related needs will improve Outcome: Progressing   Problem: Clinical Measurements: Goal: Ability to maintain clinical measurements within normal limits will improve Outcome: Progressing Goal: Will remain free from infection Outcome: Progressing Goal: Diagnostic test results will improve Outcome: Progressing Goal: Respiratory complications will improve Outcome: Progressing Goal: Cardiovascular complication will be avoided Outcome: Progressing   Problem: Activity: Goal: Risk for activity intolerance will decrease Outcome: Progressing   Problem: Nutrition: Goal: Adequate nutrition will be maintained Outcome: Progressing   Problem: Coping: Goal: Level of anxiety will decrease Outcome: Progressing   Problem: Elimination: Goal: Will not experience complications related to bowel motility Outcome: Progressing Goal: Will not experience complications related to urinary retention Outcome: Progressing   Problem: Safety: Goal: Ability to remain free from injury will improve Outcome: Progressing   Problem: Skin Integrity: Goal: Risk for impaired skin integrity will decrease Outcome: Progressing   Problem: Education: Goal: Ability to demonstrate management of disease process will improve Outcome: Progressing Goal: Ability to verbalize understanding of medication therapies will improve Outcome: Progressing Goal: Individualized Educational Video(s) Outcome: Progressing   Problem: Activity: Goal: Capacity to carry out activities will improve Outcome: Progressing   Problem: Cardiac: Goal: Ability to achieve and maintain adequate cardiopulmonary perfusion will improve Outcome: Progressing    Problem: Education: Goal: Knowledge of disease and its progression will improve Outcome: Progressing Goal: Individualized Educational Video(s) Outcome: Progressing   Problem: Fluid Volume: Goal: Compliance with measures to maintain balanced fluid volume will improve Outcome: Progressing   Problem: Health Behavior/Discharge Planning: Goal: Ability to manage health-related needs will improve Outcome: Progressing   Problem: Nutritional: Goal: Ability to make healthy dietary choices will improve Outcome: Progressing   Problem: Clinical Measurements: Goal: Complications related to the disease process, condition or treatment will be avoided or minimized Outcome: Progressing

## 2018-07-27 DIAGNOSIS — N2581 Secondary hyperparathyroidism of renal origin: Secondary | ICD-10-CM | POA: Diagnosis not present

## 2018-07-27 DIAGNOSIS — N186 End stage renal disease: Secondary | ICD-10-CM | POA: Diagnosis not present

## 2018-07-30 DIAGNOSIS — N186 End stage renal disease: Secondary | ICD-10-CM | POA: Diagnosis not present

## 2018-07-30 DIAGNOSIS — N2581 Secondary hyperparathyroidism of renal origin: Secondary | ICD-10-CM | POA: Diagnosis not present

## 2018-08-01 DIAGNOSIS — N186 End stage renal disease: Secondary | ICD-10-CM | POA: Diagnosis not present

## 2018-08-01 DIAGNOSIS — N2581 Secondary hyperparathyroidism of renal origin: Secondary | ICD-10-CM | POA: Diagnosis not present

## 2018-08-03 ENCOUNTER — Encounter (HOSPITAL_COMMUNITY): Payer: Self-pay | Admitting: Vascular Surgery

## 2018-08-03 ENCOUNTER — Telehealth: Payer: Self-pay | Admitting: Hematology

## 2018-08-03 DIAGNOSIS — N2581 Secondary hyperparathyroidism of renal origin: Secondary | ICD-10-CM | POA: Diagnosis not present

## 2018-08-03 DIAGNOSIS — N186 End stage renal disease: Secondary | ICD-10-CM | POA: Diagnosis not present

## 2018-08-03 NOTE — Telephone Encounter (Signed)
Pt's wife called to reschedule appt due to dialysis schedule. Pt has been rescheduled to see Dr. Irene Limbo on 9/5 at [place] to arrive 30 minutes early.

## 2018-08-06 ENCOUNTER — Inpatient Hospital Stay: Payer: Medicare HMO | Admitting: Oncology

## 2018-08-06 DIAGNOSIS — N186 End stage renal disease: Secondary | ICD-10-CM | POA: Diagnosis not present

## 2018-08-06 DIAGNOSIS — N2581 Secondary hyperparathyroidism of renal origin: Secondary | ICD-10-CM | POA: Diagnosis not present

## 2018-08-08 DIAGNOSIS — N186 End stage renal disease: Secondary | ICD-10-CM | POA: Diagnosis not present

## 2018-08-08 DIAGNOSIS — N2581 Secondary hyperparathyroidism of renal origin: Secondary | ICD-10-CM | POA: Diagnosis not present

## 2018-08-09 ENCOUNTER — Other Ambulatory Visit: Payer: Self-pay

## 2018-08-09 NOTE — Patient Outreach (Signed)
[facility]) Care Management  08/09/2018  Keevin Panebianco Sr. 1946/01/03 [id number]     EMMI-HF RED ON EMMI ALERT Day # 8 Date: 08/08/18 Red Alert Reason: " Went to follow up appt? No"   Outreach attempt # 1 to patient. Spoke with patient who voices he is doing pretty good. He denies any acute needs or concerns at this time. Reviewed and addressed red alert with patient. He voices that he goes to see cardiologist on next week. PCP appt is 09/04/18 per d/c instructions of one month follow up. He denies any issues with transportation. Patient voices that he is going to HD and tolerating treatments. He has scale in the home but only weighs on non dialysis days. He denies any issues with swelling and/or edema at this time. Patient confirmed that he has all his meds and no issues or concerns regarding them. Dicussed EMMI automated calls with patient. Patient does not wish to continue 45 day of calls as he states he does not weigh at home daily. He is aware of s/s of worsening condition and when to seek medical attention. He denies any RN CM needs or concerns at this time but was appreciative of call.       Plan: RN CM will close case at this time. RN CM will notify [facility] administrative assistant to deactivate automated calls.    Enzo Montgomery, RN,BSN,CCM [facility] Telephonic Care Management Coordinator Direct Phone: [phone] Toll Free: [phone] Fax: [phone]

## 2018-08-10 DIAGNOSIS — N2581 Secondary hyperparathyroidism of renal origin: Secondary | ICD-10-CM | POA: Diagnosis not present

## 2018-08-10 DIAGNOSIS — N186 End stage renal disease: Secondary | ICD-10-CM | POA: Diagnosis not present

## 2018-08-13 DIAGNOSIS — N186 End stage renal disease: Secondary | ICD-10-CM | POA: Diagnosis not present

## 2018-08-13 DIAGNOSIS — N2581 Secondary hyperparathyroidism of renal origin: Secondary | ICD-10-CM | POA: Diagnosis not present

## 2018-08-14 DIAGNOSIS — E7849 Other hyperlipidemia: Secondary | ICD-10-CM | POA: Diagnosis not present

## 2018-08-14 DIAGNOSIS — E1140 Type 2 diabetes mellitus with diabetic neuropathy, unspecified: Secondary | ICD-10-CM | POA: Diagnosis not present

## 2018-08-14 DIAGNOSIS — I5022 Chronic systolic (congestive) heart failure: Secondary | ICD-10-CM | POA: Diagnosis not present

## 2018-08-14 DIAGNOSIS — I10 Essential (primary) hypertension: Secondary | ICD-10-CM | POA: Diagnosis not present

## 2018-08-14 DIAGNOSIS — D500 Iron deficiency anemia secondary to blood loss (chronic): Secondary | ICD-10-CM | POA: Diagnosis not present

## 2018-08-15 DIAGNOSIS — N186 End stage renal disease: Secondary | ICD-10-CM | POA: Diagnosis not present

## 2018-08-15 DIAGNOSIS — N2581 Secondary hyperparathyroidism of renal origin: Secondary | ICD-10-CM | POA: Diagnosis not present

## 2018-08-17 DIAGNOSIS — N186 End stage renal disease: Secondary | ICD-10-CM | POA: Diagnosis not present

## 2018-08-17 DIAGNOSIS — N2581 Secondary hyperparathyroidism of renal origin: Secondary | ICD-10-CM | POA: Diagnosis not present

## 2018-08-18 DIAGNOSIS — Z992 Dependence on renal dialysis: Secondary | ICD-10-CM | POA: Diagnosis not present

## 2018-08-18 DIAGNOSIS — N186 End stage renal disease: Secondary | ICD-10-CM | POA: Diagnosis not present

## 2018-08-18 DIAGNOSIS — I509 Heart failure, unspecified: Secondary | ICD-10-CM | POA: Diagnosis not present

## 2018-08-19 ENCOUNTER — Emergency Department (HOSPITAL_COMMUNITY)
Admission: EM | Admit: 2018-08-19 | Discharge: 2018-08-19 | Disposition: A | Discharge status: Home / Self Care | Payer: Medicare HMO | Attending: Emergency Medicine | Admitting: Emergency Medicine

## 2018-08-19 ENCOUNTER — Encounter (HOSPITAL_COMMUNITY): Payer: Self-pay | Admitting: Radiology

## 2018-08-19 ENCOUNTER — Emergency Department (HOSPITAL_COMMUNITY): Payer: Medicare HMO

## 2018-08-19 ENCOUNTER — Other Ambulatory Visit: Payer: Self-pay

## 2018-08-19 DIAGNOSIS — N50811 Right testicular pain: Secondary | ICD-10-CM | POA: Diagnosis present

## 2018-08-19 DIAGNOSIS — Z79899 Other long term (current) drug therapy: Secondary | ICD-10-CM | POA: Insufficient documentation

## 2018-08-19 DIAGNOSIS — F17210 Nicotine dependence, cigarettes, uncomplicated: Secondary | ICD-10-CM | POA: Insufficient documentation

## 2018-08-19 DIAGNOSIS — R1031 Right lower quadrant pain: Secondary | ICD-10-CM | POA: Insufficient documentation

## 2018-08-19 DIAGNOSIS — I5022 Chronic systolic (congestive) heart failure: Secondary | ICD-10-CM | POA: Insufficient documentation

## 2018-08-19 DIAGNOSIS — E1122 Type 2 diabetes mellitus with diabetic chronic kidney disease: Secondary | ICD-10-CM | POA: Insufficient documentation

## 2018-08-19 DIAGNOSIS — J449 Chronic obstructive pulmonary disease, unspecified: Secondary | ICD-10-CM | POA: Insufficient documentation

## 2018-08-19 DIAGNOSIS — N183 Chronic kidney disease, stage 3 (moderate): Secondary | ICD-10-CM | POA: Insufficient documentation

## 2018-08-19 DIAGNOSIS — R1030 Lower abdominal pain, unspecified: Secondary | ICD-10-CM | POA: Diagnosis not present

## 2018-08-19 DIAGNOSIS — Z794 Long term (current) use of insulin: Secondary | ICD-10-CM | POA: Insufficient documentation

## 2018-08-19 DIAGNOSIS — Z951 Presence of aortocoronary bypass graft: Secondary | ICD-10-CM | POA: Diagnosis not present

## 2018-08-19 DIAGNOSIS — I130 Hypertensive heart and chronic kidney disease with heart failure and stage 1 through stage 4 chronic kidney disease, or unspecified chronic kidney disease: Secondary | ICD-10-CM | POA: Diagnosis not present

## 2018-08-19 LAB — CBC WITH DIFFERENTIAL/PLATELET
Abs Immature Granulocytes: 0 10*3/uL (ref 0.0–0.1)
Basophils Absolute: 0 10*3/uL (ref 0.0–0.1)
Basophils Relative: 1 %
Eosinophils Absolute: 0 10*3/uL (ref 0.0–0.7)
Eosinophils Relative: 1 %
HCT: 34.4 % — ABNORMAL LOW (ref 39.0–52.0)
Hemoglobin: 10.4 g/dL — ABNORMAL LOW (ref 13.0–17.0)
Immature Granulocytes: 0 %
Lymphocytes Relative: 25 %
Lymphs Abs: 1.2 10*3/uL (ref 0.7–4.0)
MCH: 29.7 pg (ref 26.0–34.0)
MCHC: 30.2 g/dL (ref 30.0–36.0)
MCV: 98.3 fL (ref 78.0–100.0)
Monocytes Absolute: 0.4 10*3/uL (ref 0.1–1.0)
Monocytes Relative: 8 %
Neutro Abs: 3.2 10*3/uL (ref 1.7–7.7)
Neutrophils Relative %: 65 %
Platelets: 225 10*3/uL (ref 150–400)
RBC: 3.5 MIL/uL — ABNORMAL LOW (ref 4.22–5.81)
RDW: 15.4 % (ref 11.5–15.5)
WBC: 4.9 10*3/uL (ref 4.0–10.5)

## 2018-08-19 LAB — COMPREHENSIVE METABOLIC PANEL
ALT: 19 U/L (ref 0–44)
AST: 25 U/L (ref 15–41)
Albumin: 3.6 g/dL (ref 3.5–5.0)
Alkaline Phosphatase: 118 U/L (ref 38–126)
Anion gap: 11 (ref 5–15)
BUN: 30 mg/dL — ABNORMAL HIGH (ref 8–23)
CO2: 29 mmol/L (ref 22–32)
Calcium: 9 mg/dL (ref 8.9–10.3)
Chloride: 97 mmol/L — ABNORMAL LOW (ref 98–111)
Creatinine, Ser: 6.78 mg/dL — ABNORMAL HIGH (ref 0.61–1.24)
GFR calc Af Amer: 8 mL/min — ABNORMAL LOW (ref >60)
GFR calc non Af Amer: 7 mL/min — ABNORMAL LOW (ref >60)
Glucose, Bld: 130 mg/dL — ABNORMAL HIGH (ref 70–99)
Potassium: 3.4 mmol/L — ABNORMAL LOW (ref 3.5–5.1)
Sodium: 137 mmol/L (ref 135–145)
Total Bilirubin: 0.6 mg/dL (ref 0.3–1.2)
Total Protein: 7.5 g/dL (ref 6.5–8.1)

## 2018-08-19 MED ORDER — IOPAMIDOL (ISOVUE-300) INJECTION 61%
INTRAVENOUS | Status: AC
  Filled 2018-08-19: qty 100

## 2018-08-19 MED ORDER — IOPAMIDOL (ISOVUE-300) INJECTION 61%
100.0000 mL | Freq: Once | INTRAVENOUS | Status: AC | PRN
  Administered 2018-08-19: 100 mL via INTRAVENOUS

## 2018-08-19 NOTE — ED Triage Notes (Signed)
Pt reports feeling a knot on his right side of pelvic area when he woke up today. States he took tylenol and does not feel the knot anymore. Reports testicle pain for 3-4 days but swelling on right side.

## 2018-08-19 NOTE — ED Provider Notes (Signed)
[facility] EMERGENCY DEPARTMENT Provider Note   CSN: [id number] Arrival date & time: 08/19/18  1009     History   Chief Complaint Chief Complaint  Patient presents with  . Testicle Pain    HPI Aimar Borghi Sr. is a 72 y.o. male.  HPI Patient presents to the emergency department with pain in the groin with a small area of swelling in the inguinal region that occurred earlier today.  The patient states he took Tylenol with relief of the pain and symptoms.  The patient states that he did have some radiation of the pain down into the scrotum.  The patient states that the pain resolved after taking Tylenol.  Patient states that he does not feel the swollen area any longer in the inguinal area.  Patient states he did not have any pain that radiated into the leg or back.  Patient states he did not have a pain in the penis itself.  The patient denies chest pain, shortness of breath, headache,blurred vision, neck pain, fever, cough, weakness, numbness, dizziness, anorexia, edema,  nausea, vomiting, diarrhea, rash, back pain, dysuria, hematemesis, bloody stool, near syncope, or syncope. Past Medical History:  Diagnosis Date  . Acute left systolic heart failure ([place]) 01/06/2016  . Adenoma of left adrenal gland 06/16/2014   CT Abdomen (06/04/2014): Incidental, 20 mm, 8.33 HU suggesting benign adenoma   . Alcohol abuse 02/15/2007   Reports consuming 1 pint of gin on weekends, does not drink every day. Denies history of seizures, blackouts, or tremors.    . Allergic rhinitis 04/09/2014  . Anemia 11/12/2013   Unclear cause as of yet (? EtOH abuse)   . Anemia of chronic kidney failure, stage 4 (severe) ([place]) 07/28/2017  . Benign prostatic hypertrophy 06/02/2014  . Chronic kidney disease, stage 3 ([place]) 11/12/2013  . Chronic systolic heart failure ([place]) 01/15/2016   Echo (01/07/2016): LVEF 35%  . COPD (chronic obstructive pulmonary disease) ([address])   . Coronary artery disease 02/15/2007     Non-STEMI 07/2005, s/p CABG x 4 (08/01/2005): LIMA to LAD, SVG to OM, RCA, PCA   . DVT (deep venous thrombosis) ([place]) 2006   RLE "when I had OHS"  . Erectile dysfunction associated with type 2 diabetes mellitus ([place]) 02/15/2007  . Essential hypertension 02/15/2007  . Hyperlipidemia 02/15/2007  . Major depression, single episode 11/02/2015  . Obesity (BMI 30.0-34.9) 02/15/2007  . Peripheral vascular occlusive disease ([place]) 12/30/2013   ABI (01/03/2014): Right 0.64, Left 0.64   . Pneumonia X 1  . Secondary hyperparathyroidism of renal origin ([place]) 07/28/2017  . Sigmoid diverticulosis 01/15/2016   Minimal per colonoscopy 01/11/2016  . Tobacco abuse 02/15/2007  . Tubular adenoma of colon 01/11/2016   Two 6 mm semi-pedunculated tubular adenomas excised endoscopically 01/11/2016.  Repeat colonoscopy due 12/2020.  . Type 2 diabetes mellitus with mild nonproliferative diabetic retinopathy with macular edema, bilateral ([place]) 10/28/2016  . Type 2 diabetes mellitus with stage 3 chronic kidney disease ([place]) 02/15/2007  . Venous thromboembolism 11/11/2013   LE Dopplers (01/2011): RLE DVT. CTA with mild subacute to chronic right-sided pulmonary emboli.  Patient has elected to continue anticoagulation.     Patient Active Problem List   Diagnosis Date Noted  . Acute on chronic left systolic heart failure ([place]) 07/18/2018  . Rib pain on left side 07/12/2018  . Anemia of chronic kidney failure, stage 5 ([place]) 07/28/2017  . Secondary hyperparathyroidism of renal origin ([place]) 07/28/2017  . Type 2 diabetes mellitus with mild  nonproliferative diabetic retinopathy with macular edema, bilateral ([place]) 10/28/2016  . Chronic systolic heart failure ([place]) 01/15/2016  . Sigmoid diverticulosis 01/15/2016  . Tubular adenoma of colon 01/11/2016  . Benign prostatic hyperplasia 06/02/2014  . Allergic rhinitis 04/09/2014  . Peripheral vascular occlusive disease ([place]) 12/30/2013  . Chronic kidney disease, stage 5 ([place]) 11/12/2013   . Healthcare maintenance 11/12/2013  . History of venous thromboembolism 11/11/2013  . Type 2 diabetes mellitus with stage 5 chronic kidney disease ([place]) 02/15/2007  . Hyperlipidemia 02/15/2007  . Obesity (BMI 30.0-34.9) 02/15/2007  . Erectile dysfunction associated with type 2 diabetes mellitus ([place]) 02/15/2007  . Alcohol abuse 02/15/2007  . Tobacco abuse 02/15/2007  . Essential hypertension 02/15/2007  . Coronary artery disease involving autologous vein coronary bypass graft with angina pectoris ([place]) 02/15/2007    Past Surgical History:  Procedure Laterality Date  . AV FISTULA PLACEMENT  07/23/2018   Procedure: RIGHT UPPER ARM ARTERIOVENOUS (AV) BRACHIOCEPHALIC FISTULA CREATION;  Surgeon: Conrad [place], MD;  Location: [facility];  Service: Vascular;;  . CARDIAC CATHETERIZATION  07/2005  . CATARACT EXTRACTION W/ INTRAOCULAR LENS  IMPLANT, BILATERAL Bilateral 04/2014   Left 04/30/2014, Right 05/07/2014  . COLONOSCOPY WITH PROPOFOL N/A 01/11/2016   Procedure: COLONOSCOPY WITH PROPOFOL;  Surgeon: Ronald Lobo, MD;  Location: [facility];  Service: Endoscopy;  Laterality: N/A;  Needs MAC.  Marland Kitchen CORONARY ARTERY BYPASS GRAFT  08/01/2005   x 4, LIMA to LAD, SVG to OM, RCA, PCA  . ESOPHAGOGASTRODUODENOSCOPY N/A 01/08/2016   Procedure: ESOPHAGOGASTRODUODENOSCOPY (EGD);  Surgeon: Wilford Corner, MD;  Location: [facility] ENDOSCOPY;  Service: Endoscopy;  Laterality: N/A;  . GIVENS CAPSULE STUDY N/A 01/12/2016   Procedure: GIVENS CAPSULE STUDY;  Surgeon: Ronald Lobo, MD;  Location: [facility];  Service: Endoscopy;  Laterality: N/A;  . INSERTION OF DIALYSIS CATHETER Right 07/23/2018   Procedure: INSERTION OF  DIALYSIS CATHETER - RIGHT INTERNAL JUGULAR PLACEMENT;  Surgeon: Conrad [place], MD;  Location: [facility];  Service: Vascular;  Laterality: Right;        Home Medications    Prior to Admission medications   Medication Sig Start Date End Date Taking? Authorizing Provider  albuterol (PROVENTIL HFA;VENTOLIN  HFA) 108 (90 Base) MCG/ACT inhaler Inhale 1 puff into the lungs every 6 (six) hours as needed for wheezing or shortness of breath.   Yes [provider]  allopurinol (ZYLOPRIM) 100 MG tablet Take 1 tablet (100 mg total) by mouth daily. 07/27/18  Yes Dixie Dials, MD  atorvastatin (LIPITOR) 20 MG tablet Take 1 tablet (20 mg total) by mouth daily. 03/02/18  Yes Oval Linsey, MD  B Complex-C-Zn-Folic Acid (DIALYVITE/ZINC) TABS Take 1 tablet by mouth daily. 08/03/18  Yes [provider]  finasteride (PROSCAR) 5 MG tablet Take 1 tablet (5 mg total) by mouth daily. 06/01/18  Yes Oval Linsey, MD  fluticasone (FLONASE) 50 MCG/ACT nasal spray Place 1 spray into both nostrils as needed for allergies or rhinitis.   Yes [provider]  labetalol (NORMODYNE) 200 MG tablet Take 200 mg by mouth 2 (two) times daily.   Yes [provider]  lidocaine-prilocaine (EMLA) cream Apply 1 application topically as needed (topical anesthesia for hemodialysis if Gebauers and Lidocaine injection are ineffective.). 07/26/18  Yes Dixie Dials, MD  pentafluoroprop-tetrafluoroeth [place]) AERO Apply 1 application topically as needed (topical anesthesia for hemodialysis). 07/26/18  Yes Dixie Dials, MD  terazosin (HYTRIN) 10 MG capsule Take 1 capsule (10 mg total) by mouth at bedtime. 03/02/18  Yes Oval Linsey,  MD  tiZANidine (ZANAFLEX) 2 MG tablet Take 1 tablet (2 mg total) by mouth at bedtime. 07/26/18  Yes Dixie Dials, MD  alteplase (CATHFLO ACTIVASE) 2 MG injection 2 mg by Intracatheter route once as needed for open catheter. 07/26/18   Dixie Dials, MD  Darbepoetin Alfa (ARANESP) 100 MCG/0.5ML SOSY injection Inject 0.5 mLs (100 mcg total) into the vein every Thursday with hemodialysis. 08/02/18   Dixie Dials, MD  ferric gluconate 250 mg in sodium chloride 0.9 % 100 mL Inject 250 mg into the vein every Monday, Wednesday, and Friday with hemodialysis. 07/27/18   Dixie Dials, MD  heparin  1000 unit/mL SOLN injection 1 mL (1,000 Units total) by Dialysis route as needed (in dialysis). 07/26/18   Dixie Dials, MD  Multiple Vitamins-Minerals (MULTIVITAMIN ADULT) TABS Take 1 tablet by mouth every morning. 03/02/18   Oval Linsey, MD  sodium chloride 0.9 % infusion Inject 250 mLs into the vein as needed (for IV line care(Saline / Heparin Lock)). 07/26/18   Dixie Dials, MD  sodium chloride 0.9 % infusion Inject 100 mLs into the vein as needed (symptomatic hypotension or decrease in SBP < 90 mmHg). 07/26/18   Dixie Dials, MD  sodium chloride 0.9 % infusion Inject 100 mLs into the vein as needed (severe cramping). 07/26/18   Dixie Dials, MD    Family History Family History  Problem Relation Age of Onset  . Heart disease Father   . COPD Father   . Diabetes Mellitus II Mother   . Healthy Sister   . Healthy Brother   . Healthy Daughter   . Healthy Son   . Healthy Sister   . Healthy Sister   . Healthy Sister   . Healthy Sister   . Healthy Daughter   . Healthy Son   . Healthy Son   . Healthy Son   . Healthy Son   . Healthy Son   . Healthy Son     Social History Social History   Tobacco Use  . Smoking status: Current Every Day Smoker    Packs/day: 0.30    Years: 60.00    Pack years: 18.00    Types: Cigarettes  . Smokeless tobacco: Former Systems developer    Types: Snuff, Chew  Substance Use Topics  . Alcohol use: Yes    Alcohol/week: 5.0 standard drinks    Types: 5 Shots of liquor per week    Comment: 07/18/2018 Drinks 1/2 pint of gin over the weekend but does not drink everyday. Denies hx of blackouts, seizures, tremors.  . Drug use: No     Allergies   Nicotine transdermal system [nicotine]   Review of Systems Review of Systems All other systems negative except as documented in the HPI. All pertinent positives and negatives as reviewed in the HPI.  Physical Exam Updated Vital Signs BP (!) 145/64   Pulse 61   Temp 97.9 F (36.6 C) (Oral)   Resp 13   SpO2 99%    Physical Exam  Constitutional: He is oriented to person, place, and time. He appears well-developed and well-nourished. No distress.  HENT:  Head: Normocephalic and atraumatic.  Mouth/Throat: Oropharynx is clear and moist.  Eyes: Pupils are equal, round, and reactive to light.  Neck: Normal range of motion. Neck supple.  Cardiovascular: Normal rate, regular rhythm and normal heart sounds. Exam reveals no gallop and no friction rub.  No murmur heard. Pulmonary/Chest: Effort normal and breath sounds normal. No respiratory distress. He has no wheezes.  Abdominal:  Soft. Bowel sounds are normal. He exhibits no distension. There is no tenderness. There is no guarding. Hernia confirmed negative in the right inguinal area and confirmed negative in the left inguinal area.  Genitourinary: Penis normal. Right testis shows no mass, no swelling and no tenderness. Left testis shows no mass, no swelling and no tenderness.  Lymphadenopathy: No inguinal adenopathy noted on the right or left side.  Neurological: He is alert and oriented to person, place, and time. He exhibits normal muscle tone. Coordination normal.  Skin: Skin is warm and dry. Capillary refill takes less than 2 seconds. No rash noted. No erythema.  Psychiatric: He has a normal mood and affect. His behavior is normal.  Nursing note and vitals reviewed.    ED Treatments / Results  Labs (all labs ordered are listed, but only abnormal results are displayed) Labs Reviewed  CBC WITH DIFFERENTIAL/PLATELET - Abnormal; Notable for the following components:      Result Value   RBC 3.50 (*)    Hemoglobin 10.4 (*)    HCT 34.4 (*)    All other components within normal limits  COMPREHENSIVE METABOLIC PANEL - Abnormal; Notable for the following components:   Potassium 3.4 (*)    Chloride 97 (*)    Glucose, Bld 130 (*)    BUN 30 (*)    Creatinine, Ser 6.78 (*)    GFR calc non Af Amer 7 (*)    GFR calc Af Amer 8 (*)    All other components  within normal limits    EKG None  Radiology Ct Abdomen Pelvis W Contrast  Result Date: 08/19/2018 CLINICAL DATA:  Dialysis patient, acute right lower quadrant pain EXAM: CT ABDOMEN AND PELVIS WITH CONTRAST TECHNIQUE: Multidetector CT imaging of the abdomen and pelvis was performed using the standard protocol following bolus administration of intravenous contrast. CONTRAST:  159m ISOVUE-300 IOPAMIDOL (ISOVUE-300) INJECTION 61% COMPARISON:  02/09/2011 CT without contrast FINDINGS: Lower chest: Previous median sternotomy. Dialysis catheter tips extend into the SVC RA junction. Normal heart size. No pericardial effusion. Native coronary atherosclerosis noted. Symmetric gynecomastia present. Small right pleural effusion with pleural thickening and chronic appearing right basilar atelectasis/scarring. Minor left basilar atelectasis. Degenerative changes of the lower thoracic spine. Hepatobiliary: No focal liver abnormality is seen. No gallstones, gallbladder wall thickening, or biliary dilatation. Pancreas: Mild atrophy of the distal pancreas body and tail without definite ductal dilatation or focal abnormality by CT. This remains nonspecific. No surrounding inflammatory process or fluid collection. Spleen: Normal in size without focal abnormality. Adrenals/Urinary Tract: Stable nodular thickening of the left adrenal gland, suspect adenoma. Right adrenal gland unremarkable. Stable left renal hypodense cysts. Renal vascular calcifications noted. No hydronephrosis obstruction. Ureters are symmetric and decompressed. Bladder is collapsed. Stomach/Bowel: Negative for bowel obstruction, significant dilatation, ileus, free air. Portions of the appendix are demonstrated in the right lower quadrant and appear normal. No fluid collection or abscess. Vascular/Lymphatic: Abdominal atherosclerosis noted. Negative for aneurysm or occlusive process. No retroperitoneal hemorrhage or hematoma. No adenopathy. Reproductive:  Prostate gland enlarged. Seminal vesicles are prominent. No other acute finding by CT. Other: No abdominal wall hernia or abnormality. No abdominopelvic ascites. Musculoskeletal: Degenerative changes of the spine. No acute osseous finding. IMPRESSION: No acute intra-abdominal or pelvic finding. Normal appendix demonstrated. Chronic appearing right pleural effusion with pleural thickening and right basilar atelectasis/scarring. Abdominal atherosclerosis without aneurysm. No acute vascular process Stable left adrenal hypodense nodular mass, suspect small adenoma Stable left renal cysts Nonspecific pancreatic distal body and tail  atrophy without ductal dilatation or focal abnormality. No surrounding inflammatory process. Consider further evaluation with follow-up nonemergent abdominal MRI Prostate enlargement Electronically Signed   By: Jerilynn Mages.  Shick M.D.   On: 08/19/2018 12:28    Procedures Procedures (including critical care time)  Medications Ordered in ED Medications  iopamidol (ISOVUE-300) 61 % injection 100 mL (100 mLs Intravenous Contrast Given 08/19/18 1134)     Initial Impression / Assessment and Plan / ED Course  I have reviewed the triage vital signs and the nursing notes.  Pertinent labs & imaging results that were available during my care of the patient were reviewed by me and considered in my medical decision making (see chart for details).     The patient did not have any testicular pain or swelling on exam.  He also did not have any swelling in the inguinal region.  At this point I am not totally clear as to what caused this issue for the patient but it could be that he had a piece of bowel that slipped into the inguinal canal.  I advised the patient follow-up with his primary doctor.  There is no testicular issue noted on exam. Final Clinical Impressions(s) / ED Diagnoses   Final diagnoses:  None    ED Discharge Orders    None       Dalia Heading, PA-C 08/19/18 [place], DO 08/20/18 [phone]

## 2018-08-19 NOTE — Discharge Instructions (Addendum)
Return here as needed. Follow up with your doctor. °

## 2018-08-19 NOTE — ED Notes (Signed)
Pt complains of testicle/groin pain and states that he had a knot to right side of upper groin at 0800 this morning, pt took 2 tylenol and knot and pain went away. Pt is MWF dialysis pt, last full treatment was Friday. Pt does not make urine often, unable to give specimen at this time.

## 2018-08-19 NOTE — ED Notes (Signed)
Patient transported to CT 

## 2018-08-20 DIAGNOSIS — N186 End stage renal disease: Secondary | ICD-10-CM | POA: Diagnosis not present

## 2018-08-20 DIAGNOSIS — N2581 Secondary hyperparathyroidism of renal origin: Secondary | ICD-10-CM | POA: Diagnosis not present

## 2018-08-21 ENCOUNTER — Telehealth: Payer: Self-pay | Admitting: Internal Medicine

## 2018-08-21 NOTE — Telephone Encounter (Signed)
[facility] RECORDS FAXED TO THEM [phone]

## 2018-08-21 NOTE — Telephone Encounter (Signed)
Printed and faxed to # in note

## 2018-08-22 DIAGNOSIS — N186 End stage renal disease: Secondary | ICD-10-CM | POA: Diagnosis not present

## 2018-08-22 DIAGNOSIS — N2581 Secondary hyperparathyroidism of renal origin: Secondary | ICD-10-CM | POA: Diagnosis not present

## 2018-08-23 ENCOUNTER — Inpatient Hospital Stay: Payer: Medicare HMO | Admitting: Hematology

## 2018-08-23 ENCOUNTER — Telehealth: Payer: Self-pay | Admitting: *Deleted

## 2018-08-23 NOTE — Telephone Encounter (Signed)
FYI  "Scheduled today for an appointment but I need to know what is appointment for?  Who referred me?  Where is office located?  Is there a co-pay?  I'll come in but I do not have money to pay a co-pay.  Appointment may need to be rescheduled to next Tuesday for me to try to come up with money for co-pay.  No family or other support coming with him.  I will call if I can't come in to reschedule."  All questions answered; instructed to arrive trhirty minutes early, free valet parking available and instructions how to leave message on voicemail.  Reviewed how to leave message with return call in.

## 2018-08-24 ENCOUNTER — Telehealth: Payer: Self-pay | Admitting: Hematology

## 2018-08-24 DIAGNOSIS — N2581 Secondary hyperparathyroidism of renal origin: Secondary | ICD-10-CM | POA: Diagnosis not present

## 2018-08-24 DIAGNOSIS — N186 End stage renal disease: Secondary | ICD-10-CM | POA: Diagnosis not present

## 2018-08-24 NOTE — Telephone Encounter (Signed)
lft a vm w/the pt's wife to cb and reschedule his hematology appt

## 2018-08-27 DIAGNOSIS — N2581 Secondary hyperparathyroidism of renal origin: Secondary | ICD-10-CM | POA: Diagnosis not present

## 2018-08-27 DIAGNOSIS — N186 End stage renal disease: Secondary | ICD-10-CM | POA: Diagnosis not present

## 2018-08-29 DIAGNOSIS — N2581 Secondary hyperparathyroidism of renal origin: Secondary | ICD-10-CM | POA: Diagnosis not present

## 2018-08-29 DIAGNOSIS — N186 End stage renal disease: Secondary | ICD-10-CM | POA: Diagnosis not present

## 2018-08-31 DIAGNOSIS — N186 End stage renal disease: Secondary | ICD-10-CM | POA: Diagnosis not present

## 2018-08-31 DIAGNOSIS — N2581 Secondary hyperparathyroidism of renal origin: Secondary | ICD-10-CM | POA: Diagnosis not present

## 2018-09-03 DIAGNOSIS — N186 End stage renal disease: Secondary | ICD-10-CM | POA: Diagnosis not present

## 2018-09-03 DIAGNOSIS — N2581 Secondary hyperparathyroidism of renal origin: Secondary | ICD-10-CM | POA: Diagnosis not present

## 2018-09-04 ENCOUNTER — Ambulatory Visit (HOSPITAL_COMMUNITY)
Admit: 2018-09-04 | Discharge: 2018-09-04 | Disposition: A | Discharge status: Home / Self Care | Payer: Medicare HMO | Source: Ambulatory Visit | Attending: Vascular Surgery | Admitting: Vascular Surgery

## 2018-09-04 ENCOUNTER — Other Ambulatory Visit: Payer: Self-pay

## 2018-09-04 ENCOUNTER — Ambulatory Visit (INDEPENDENT_AMBULATORY_CARE_PROVIDER_SITE_OTHER): Payer: Self-pay | Admitting: Physician Assistant

## 2018-09-04 VITALS — BP 117/65 | HR 47 | Temp 97.6°F | Resp 16 | Ht 67.0 in | Wt 170.0 lb

## 2018-09-04 DIAGNOSIS — Z48812 Encounter for surgical aftercare following surgery on the circulatory system: Secondary | ICD-10-CM | POA: Insufficient documentation

## 2018-09-04 DIAGNOSIS — N185 Chronic kidney disease, stage 5: Secondary | ICD-10-CM | POA: Insufficient documentation

## 2018-09-04 DIAGNOSIS — N186 End stage renal disease: Secondary | ICD-10-CM

## 2018-09-04 DIAGNOSIS — Z992 Dependence on renal dialysis: Secondary | ICD-10-CM

## 2018-09-04 NOTE — Progress Notes (Signed)
POST OPERATIVE OFFICE NOTE    CC:  F/u for surgery  HPI:  This is a 72 y.o. male who is s/p placement of right IJ [facility] and creation of right BC AVF on 07/23/18 by Dr. Bridgett Larsson.  He presents today for follow up.  He denies any pain or numbness in his right hand.  He currently dialyzes via right IJ TDC without difficulty.  He has dialysis M/W/F at the [facility] location.   Allergies  Allergen Reactions  . Nicotine Transdermal System [Nicotine] Other (See Comments)    Vivid dreams    Current Outpatient Medications  Medication Sig Dispense Refill  . albuterol (PROVENTIL HFA;VENTOLIN HFA) 108 (90 Base) MCG/ACT inhaler Inhale 1 puff into the lungs every 6 (six) hours as needed for wheezing or shortness of breath.    . allopurinol (ZYLOPRIM) 100 MG tablet Take 1 tablet (100 mg total) by mouth daily. 30 tablet 3  . alteplase (CATHFLO ACTIVASE) 2 MG injection 2 mg by Intracatheter route once as needed for open catheter.    Marland Kitchen atorvastatin (LIPITOR) 20 MG tablet Take 1 tablet (20 mg total) by mouth daily. 90 tablet 3  . B Complex-C-Zn-Folic Acid (DIALYVITE/ZINC) TABS Take 1 tablet by mouth daily.  2  . Darbepoetin Alfa (ARANESP) 100 MCG/0.5ML SOSY injection Inject 0.5 mLs (100 mcg total) into the vein every Thursday with hemodialysis.    . ferric gluconate 250 mg in sodium chloride 0.9 % 100 mL Inject 250 mg into the vein every Monday, Wednesday, and Friday with hemodialysis.    . finasteride (PROSCAR) 5 MG tablet Take 1 tablet (5 mg total) by mouth daily. 30 tablet 11  . fluticasone (FLONASE) 50 MCG/ACT nasal spray Place 1 spray into both nostrils as needed for allergies or rhinitis.    . heparin 1000 unit/mL SOLN injection 1 mL (1,000 Units total) by Dialysis route as needed (in dialysis).    . labetalol (NORMODYNE) 200 MG tablet Take 200 mg by mouth 2 (two) times daily.    Marland Kitchen lidocaine-prilocaine (EMLA) cream Apply 1 application topically as needed (topical anesthesia for hemodialysis if Gebauers and  Lidocaine injection are ineffective.).    Marland Kitchen Multiple Vitamins-Minerals (MULTIVITAMIN ADULT) TABS Take 1 tablet by mouth every morning. 90 tablet 3  . pentafluoroprop-tetrafluoroeth (GEBAUERS) AERO Apply 1 application topically as needed (topical anesthesia for hemodialysis).  0  . sodium chloride 0.9 % infusion Inject 250 mLs into the vein as needed (for IV line care(Saline / Heparin Lock)).  0  . sodium chloride 0.9 % infusion Inject 100 mLs into the vein as needed (symptomatic hypotension or decrease in SBP < 90 mmHg).  0  . sodium chloride 0.9 % infusion Inject 100 mLs into the vein as needed (severe cramping).  0  . terazosin (HYTRIN) 10 MG capsule Take 1 capsule (10 mg total) by mouth at bedtime. 90 capsule 3  . tiZANidine (ZANAFLEX) 2 MG tablet Take 1 tablet (2 mg total) by mouth at bedtime. 30 tablet 0   No current facility-administered medications for this visit.      ROS:  See HPI  Physical Exam:   Vitals:   09/04/18 1435  BP: 117/65  Pulse: (!) 47  Resp: 16  Temp: 97.6 F (36.4 C)  SpO2: 99%   Vitals:   09/04/18 1435  Weight: 170 lb (77.1 kg)  Height: 5\' 7"  (1.702 m)   Body mass index is 26.63 kg/m.  Incision:  Well healed Extremities:  Excellent thrill within the fistula  and easily palpable. Excellent grip right hand.  Motor and sensation are in tact.  Right TDC in place.   Assessment/Plan:  This is a 72 y.o. male who is s/p: placement of right IJ [facility] and creation of right BC AVF on 07/23/18 by Dr. Bridgett Larsson.     -pt doing well since surgery.  His fistula has an excellent thrill/bruit throughout-it gets a little more difficult to palpate on the proximal arm.   -on duplex today, the diameter ranges from 0.44cm-0.55cm and depth 0.21cm-0.84cm (shoulder).   On exam, it is maturing very nicely.   Will have him return in 4 weeks with repeat duplex to evaluate fistula and make sure it continues to mature.    Leontine Locket, PA-C [facility] [phone]  Clinic MD:  Early

## 2018-09-05 DIAGNOSIS — N186 End stage renal disease: Secondary | ICD-10-CM | POA: Diagnosis not present

## 2018-09-05 DIAGNOSIS — N2581 Secondary hyperparathyroidism of renal origin: Secondary | ICD-10-CM | POA: Diagnosis not present

## 2018-09-06 ENCOUNTER — Other Ambulatory Visit: Payer: Self-pay

## 2018-09-06 DIAGNOSIS — Z992 Dependence on renal dialysis: Principal | ICD-10-CM

## 2018-09-06 DIAGNOSIS — N186 End stage renal disease: Secondary | ICD-10-CM

## 2018-09-07 DIAGNOSIS — N186 End stage renal disease: Secondary | ICD-10-CM | POA: Diagnosis not present

## 2018-09-07 DIAGNOSIS — N2581 Secondary hyperparathyroidism of renal origin: Secondary | ICD-10-CM | POA: Diagnosis not present

## 2018-09-10 DIAGNOSIS — N186 End stage renal disease: Secondary | ICD-10-CM | POA: Diagnosis not present

## 2018-09-10 DIAGNOSIS — N2581 Secondary hyperparathyroidism of renal origin: Secondary | ICD-10-CM | POA: Diagnosis not present

## 2018-09-12 DIAGNOSIS — N186 End stage renal disease: Secondary | ICD-10-CM | POA: Diagnosis not present

## 2018-09-12 DIAGNOSIS — N2581 Secondary hyperparathyroidism of renal origin: Secondary | ICD-10-CM | POA: Diagnosis not present

## 2018-09-14 ENCOUNTER — Ambulatory Visit: Payer: Medicare HMO | Admitting: Internal Medicine

## 2018-09-14 DIAGNOSIS — N186 End stage renal disease: Secondary | ICD-10-CM | POA: Diagnosis not present

## 2018-09-14 DIAGNOSIS — N2581 Secondary hyperparathyroidism of renal origin: Secondary | ICD-10-CM | POA: Diagnosis not present

## 2018-09-17 ENCOUNTER — Telehealth: Payer: Self-pay | Admitting: Hematology and Oncology

## 2018-09-17 DIAGNOSIS — Z992 Dependence on renal dialysis: Secondary | ICD-10-CM | POA: Diagnosis not present

## 2018-09-17 DIAGNOSIS — N2581 Secondary hyperparathyroidism of renal origin: Secondary | ICD-10-CM | POA: Diagnosis not present

## 2018-09-17 DIAGNOSIS — I509 Heart failure, unspecified: Secondary | ICD-10-CM | POA: Diagnosis not present

## 2018-09-17 DIAGNOSIS — N186 End stage renal disease: Secondary | ICD-10-CM | POA: Diagnosis not present

## 2018-09-17 NOTE — Telephone Encounter (Signed)
Received a call from [place] at [facility] to reschedule a hematology appt. Pt has been rescheduled to see Dr. Audelia Hives on 10/8 at 11am. [place] will notify the pt.

## 2018-09-19 DIAGNOSIS — N186 End stage renal disease: Secondary | ICD-10-CM | POA: Diagnosis not present

## 2018-09-19 DIAGNOSIS — N2581 Secondary hyperparathyroidism of renal origin: Secondary | ICD-10-CM | POA: Diagnosis not present

## 2018-09-21 DIAGNOSIS — N186 End stage renal disease: Secondary | ICD-10-CM | POA: Diagnosis not present

## 2018-09-21 DIAGNOSIS — N2581 Secondary hyperparathyroidism of renal origin: Secondary | ICD-10-CM | POA: Diagnosis not present

## 2018-09-23 ENCOUNTER — Other Ambulatory Visit: Payer: Self-pay | Admitting: Hematology and Oncology

## 2018-09-23 DIAGNOSIS — D649 Anemia, unspecified: Secondary | ICD-10-CM

## 2018-09-24 ENCOUNTER — Telehealth: Payer: Self-pay | Admitting: Emergency Medicine

## 2018-09-24 DIAGNOSIS — N2581 Secondary hyperparathyroidism of renal origin: Secondary | ICD-10-CM | POA: Diagnosis not present

## 2018-09-24 DIAGNOSIS — N186 End stage renal disease: Secondary | ICD-10-CM | POA: Diagnosis not present

## 2018-09-24 NOTE — Telephone Encounter (Signed)
Called [facility] on 10/7 asking for most recent records on pt for MD [facility].  Referred to D[facility] Center for more information.  Info will be faxed to [facility] tomorrow morning (10/8) per DC for pt's new visit with MD Audelia Hives.

## 2018-09-25 ENCOUNTER — Inpatient Hospital Stay: Payer: Medicare HMO

## 2018-09-25 ENCOUNTER — Inpatient Hospital Stay: Payer: Medicare HMO | Attending: Hematology and Oncology | Admitting: Hematology and Oncology

## 2018-09-25 VITALS — BP 132/65 | HR 68 | Resp 20 | Ht 67.0 in | Wt 176.8 lb

## 2018-09-25 DIAGNOSIS — Z86711 Personal history of pulmonary embolism: Secondary | ICD-10-CM | POA: Diagnosis not present

## 2018-09-25 DIAGNOSIS — N186 End stage renal disease: Secondary | ICD-10-CM

## 2018-09-25 DIAGNOSIS — F17210 Nicotine dependence, cigarettes, uncomplicated: Secondary | ICD-10-CM

## 2018-09-25 DIAGNOSIS — D72818 Other decreased white blood cell count: Secondary | ICD-10-CM | POA: Diagnosis not present

## 2018-09-25 DIAGNOSIS — D631 Anemia in chronic kidney disease: Secondary | ICD-10-CM

## 2018-09-25 DIAGNOSIS — Z992 Dependence on renal dialysis: Secondary | ICD-10-CM

## 2018-09-25 DIAGNOSIS — Z86718 Personal history of other venous thrombosis and embolism: Secondary | ICD-10-CM

## 2018-09-25 DIAGNOSIS — D649 Anemia, unspecified: Secondary | ICD-10-CM | POA: Diagnosis not present

## 2018-09-25 DIAGNOSIS — I132 Hypertensive heart and chronic kidney disease with heart failure and with stage 5 chronic kidney disease, or end stage renal disease: Secondary | ICD-10-CM

## 2018-09-25 LAB — RETICULOCYTES
IMMATURE RETIC FRACT: 7.5 % (ref 2.3–15.9)
RBC.: 3.81 MIL/uL — ABNORMAL LOW (ref 4.22–5.81)
RETIC CT PCT: 1 % (ref 0.4–3.1)
Retic Count, Absolute: 36.6 10*3/uL (ref 19.0–186.0)

## 2018-09-25 LAB — COMPREHENSIVE METABOLIC PANEL
ALBUMIN: 3.4 g/dL — AB (ref 3.5–5.0)
ALT: 19 U/L (ref 0–44)
ANION GAP: 12 (ref 5–15)
AST: 20 U/L (ref 15–41)
Alkaline Phosphatase: 139 U/L — ABNORMAL HIGH (ref 38–126)
BUN: 23 mg/dL (ref 8–23)
CHLORIDE: 98 mmol/L (ref 98–111)
CO2: 30 mmol/L (ref 22–32)
Calcium: 9.1 mg/dL (ref 8.9–10.3)
Creatinine, Ser: 4.26 mg/dL (ref 0.61–1.24)
GFR calc Af Amer: 15 mL/min — ABNORMAL LOW (ref >60)
GFR calc non Af Amer: 13 mL/min — ABNORMAL LOW (ref >60)
GLUCOSE: 103 mg/dL — AB (ref 70–99)
Potassium: 3.5 mmol/L (ref 3.5–5.1)
SODIUM: 140 mmol/L (ref 135–145)
TOTAL PROTEIN: 7.8 g/dL (ref 6.5–8.1)
Total Bilirubin: 0.4 mg/dL (ref 0.3–1.2)

## 2018-09-25 LAB — VITAMIN B12: VITAMIN B 12: 788 pg/mL (ref 180–914)

## 2018-09-25 LAB — CBC WITH DIFFERENTIAL (CANCER CENTER ONLY)
Abs Immature Granulocytes: 0.01 10*3/uL (ref 0.00–0.07)
BASOS ABS: 0 10*3/uL (ref 0.0–0.1)
Basophils Relative: 1 %
EOS ABS: 0.1 10*3/uL (ref 0.0–0.5)
EOS PCT: 2 %
HEMATOCRIT: 35.5 % — AB (ref 39.0–52.0)
Hemoglobin: 11.2 g/dL — ABNORMAL LOW (ref 13.0–17.0)
Immature Granulocytes: 0 %
LYMPHS ABS: 1.4 10*3/uL (ref 0.7–4.0)
Lymphocytes Relative: 40 %
MCH: 29.4 pg (ref 26.0–34.0)
MCHC: 31.5 g/dL (ref 30.0–36.0)
MCV: 93.2 fL (ref 80.0–100.0)
MONO ABS: 0.4 10*3/uL (ref 0.1–1.0)
Monocytes Relative: 10 %
NRBC: 0 % (ref 0.0–0.2)
Neutro Abs: 1.7 10*3/uL (ref 1.7–7.7)
Neutrophils Relative %: 47 %
Platelet Count: 181 10*3/uL (ref 150–400)
RBC: 3.81 MIL/uL — ABNORMAL LOW (ref 4.22–5.81)
RDW: 13.9 % (ref 11.5–15.5)
WBC Count: 3.6 10*3/uL — ABNORMAL LOW (ref 4.0–10.5)

## 2018-09-25 LAB — LACTATE DEHYDROGENASE: LDH: 184 U/L (ref 98–192)

## 2018-09-25 LAB — FOLATE: Folate: 12 ng/mL (ref >5.9)

## 2018-09-25 LAB — C-REACTIVE PROTEIN: CRP: 2.4 mg/dL — ABNORMAL HIGH (ref <1.0)

## 2018-09-25 MED ORDER — HEPARIN SODIUM (PORCINE) 1000 UNIT/ML IJ SOLN
5.0000 mL | Freq: Once | INTRAMUSCULAR | Status: AC
  Administered 2018-09-25: 1700 [IU]

## 2018-09-25 MED ORDER — SODIUM CHLORIDE 0.9 % IJ SOLN
10.0000 mL | Freq: Once | INTRAMUSCULAR | Status: AC
  Administered 2018-09-25: 10 mL via INTRAVENOUS
  Filled 2018-09-25: qty 10

## 2018-09-25 NOTE — Progress Notes (Signed)
[facility] Initial Consultation  Patient Name:  Kevin Lafavor Sr. DOB: February 03, 1946   Date of Service: September 25, 2018  Referring Physician: Edrick Oh, MD Nephrology  Primary Care Physician: Oval Linsey, Md [address] [place] [address]   Consulting Physician: Henreitta Leber, MD Hematology/Oncology  Reason for Referral: In the setting of anemia and transient thrombocytopenia, he presents now for further diagnostic and therapeutic recommendations.  History Present Illness: Kevin Castillo is a 72 year old resident of [place] whose past medical history is significant for end-stage renal disease on hemodialysis since August, 2019; allergic rhinitis; anemia of chronic kidney failure (stage IV); benign prostatic hypertrophy; chronic obstructive pulmonary disease; coronary artery disease and prior non-STEMI in August 2006; prior coronary artery bypass graft in August, 2006; deep vein thrombosis within the right lower extremity 5 years ago; chronic obstructive pulmonary disease; primary hypertension; hyperuricemia; benign prostatic hypertrophy; erectile dysfunction; dyslipidemia; active tobacco dependence; peripheral arterial disease; diabetic retinopathy; "burned-out" non-insulin requiring diabetes mellitus; and former alcohol dependence (recovered alcoholic).  His primary care physician is Dr. Oval Linsey.  He is accompanied by his attentive wife Posey Pronto.  Prior to this visit he did not take his antihypertensive medication.  He was instructed to take his labetalol at once.  On July 18, 2018 he was admitted to [facility] with end-stage renal disease where he was started on hemodialysis.  He had a 44-monthprogressive worsening of bilateral leg edema with increasing exertional dyspnea.  He responded initially to aggressive diuretic therapy.  He admitted he complained of lower back pain which resolved following  hemodialysis.  Under the direction of Dr. MEdrick Oh nephrology, hemodialysis was continued on an outpatient basis Monday Wednesday and Friday including darbepoetin, ferric gluconate, and Calcitrol.  On May 09, 2018 a complete blood count showed hemoglobin 9.3 hematocrit 28.4 WBC 3.3; platelets 133,000.    On June 22, 2018 prior to his hospitalization, a complete blood count showed hemoglobin 9.0 hematocrit 28.0 MCV 85 MCH 27.4 RDW 16.9 WBC 2.9 with 55% neutrophils 36% lymphocytes 8% monocytes 1% eosinophil; platelets 102,000.  On August 19, 2018: A complete blood count showed hemoglobin 10.4 hematocrit 34.4 MCV 98 MCH 29.7 RDW 15.4 WBC 4.9 with 65% neutrophils 25% lymphocytes 8% monocytes 1% eosinophils 1% basophils; platelets 225,000.  [facility]: A complete blood count showed hemoglobin 10.6 hematocrit 36.8 WBC 3.63; platelets 150,000.  Serum iron 52 TIBC 261 iron saturation 20%.  Serology for hepatitis B was nonreactive.  Although he took metformin in the past, he is no longer on diabetic medication.  He has no cardiac dysrhythmia or chest pain.  He denies thyroid disease.  He has no seizure disorder or stroke syndrome.  There is no gastroesophageal reflux or peptic ulcer disease.  He denies viral hepatitis, inflammatory bowel disease, or symptomatic diverticulosis.  His most recent colonoscopy was approximately 2 years ago.  Benign polyps were removed and retrieved.  He reports no bleeding tendency.  Although his uric acid has been elevated he denies gouty arthritis.  He has no rheumatoid disease.  In November 2014 he had a right lower extremity deep vein thrombosis and pulmonary embolism.  He denies any paresthesias. he was on rivaroxaban.  He is no longer on anticoagulation. Since starting hemodialysis, he has lost nearly 40 pounds.    His appetite waxes and wanes.  He denies dizziness, lightheadedness, syncope, near syncopal episodes.  His energy level is fair.  He reports no new visual changes or hearing deficit.  He denies cough, sore throat, or orthopnea.  There is no rash or itching.  He denies dyspnea at rest.  He has chronic but stable exertional dyspnea unchanged over his baseline.  No fever, shaking chills, sweats, or flulike symptoms.  He has no heartburn or indigestion.  There is no nausea, vomiting, diarrhea or constipation.  He generally moves his bowels twice daily without difficulty.    He reports no melena or bright red blood per rectum.   He is not anuric.  No urinary frequency, urgency, hematuria, or dysuria are reported.  He reports no swelling of his ankles.  There is no bleeding tendency or easy bruisability.  He has no new focal areas of bone, joint, muscle pain.  The area of his right foot is tender due to an ingrown toenail.  He is scheduled to see his podiatrist on October 24.  He denies any numbness or tingling in the fingers or toes.  It is with this background he presents now for further diagnostic and therapeutic recommendations in the setting of anemia of chronic kidney disease to exclude any confounding or contributing factors.  Past Medical History:  Diagnosis Date  . Acute left systolic heart failure ([place]) 01/06/2016  . Adenoma of left adrenal gland 06/16/2014   CT Abdomen (06/04/2014): Incidental, 20 mm, 8.33 HU suggesting benign adenoma   . Alcohol abuse 02/15/2007   Reports consuming 1 pint of gin on weekends, does not drink every day. Denies history of seizures, blackouts, or tremors.    . Allergic rhinitis 04/09/2014  . Anemia 11/12/2013   Unclear cause as of yet (? EtOH abuse)   . Anemia of chronic kidney failure, stage 4 (severe) ([place]) 07/28/2017  . Benign prostatic hypertrophy 06/02/2014  . Chronic kidney disease, stage 3 ([place]) 11/12/2013  . Chronic systolic heart failure ([place]) 01/15/2016   Echo (01/07/2016): LVEF 35%  . COPD (chronic obstructive pulmonary disease) ([place])   . Coronary artery disease 02/15/2007   Non-STEMI  07/2005, s/p CABG x 4 (08/01/2005): LIMA to LAD, SVG to OM, RCA, PCA   . DVT (deep venous thrombosis) ([place]) 2006   RLE "when I had OHS"  . Erectile dysfunction associated with type 2 diabetes mellitus ([place]) 02/15/2007  . Essential hypertension 02/15/2007  . Hyperlipidemia 02/15/2007  . Major depression, single episode 11/02/2015  . Obesity (BMI 30.0-34.9) 02/15/2007  . Peripheral vascular occlusive disease ([place]) 12/30/2013   ABI (01/03/2014): Right 0.64, Left 0.64   . Pneumonia X 1  . Secondary hyperparathyroidism of renal origin ([place]) 07/28/2017  . Sigmoid diverticulosis 01/15/2016   Minimal per colonoscopy 01/11/2016  . Tobacco abuse 02/15/2007  . Tubular adenoma of colon 01/11/2016   Two 6 mm semi-pedunculated tubular adenomas excised endoscopically 01/11/2016.  Repeat colonoscopy due 12/2020.  . Type 2 diabetes mellitus with mild nonproliferative diabetic retinopathy with macular edema, bilateral ([place]) 10/28/2016  . Type 2 diabetes mellitus with stage 3 chronic kidney disease ([place]) 02/15/2007  . Venous thromboembolism 11/11/2013   LE Dopplers (01/2011): RLE DVT. CTA with mild subacute to chronic right-sided pulmonary emboli.  Patient has elected to continue anticoagulation.    Past Surgical History:  Procedure Laterality Date  . AV FISTULA PLACEMENT  07/23/2018   Procedure: RIGHT UPPER ARM ARTERIOVENOUS (AV) BRACHIOCEPHALIC FISTULA CREATION;  Surgeon: Conrad [place], MD;  Location: [place];  Service: Vascular;;  . CARDIAC CATHETERIZATION  07/2005  . CATARACT EXTRACTION W/ INTRAOCULAR LENS  IMPLANT, BILATERAL  Bilateral 04/2014   Left 04/30/2014, Right 05/07/2014  . COLONOSCOPY WITH PROPOFOL N/A 01/11/2016   Procedure: COLONOSCOPY WITH PROPOFOL;  Surgeon: Ronald Lobo, MD;  Location: [place];  Service: Endoscopy;  Laterality: N/A;  Needs MAC.  Marland Kitchen CORONARY ARTERY BYPASS GRAFT  08/01/2005   x 4, LIMA to LAD, SVG to OM, RCA, PCA  . ESOPHAGOGASTRODUODENOSCOPY N/A 01/08/2016   Procedure:  ESOPHAGOGASTRODUODENOSCOPY (EGD);  Surgeon: Wilford Corner, MD;  Location[facility] ENDOSCOPY;  Service: Endoscopy;  Laterality: N/A;  . GIVENS CAPSULE STUDY N/A 01/12/2016   Procedure: GIVENS CAPSULE STUDY;  Surgeon: Ronald Lobo, MD;  Location: [place];  Service: Endoscopy;  Laterality: N/A;  . INSERTION OF DIALYSIS CATHETER Right 07/23/2018   Procedure: INSERTION OF  DIALYSIS CATHETER - RIGHT INTERNAL JUGULAR PLACEMENT;  Surgeon: Conrad Linden, MD;  Location: [facility] OR;  Service: Vascular;  Laterality: Right;   Family History  Problem Relation Age of Onset  . Heart disease Father   . COPD Father   . Diabetes Mellitus II Mother   . Healthy Sister   . Healthy Brother   . Healthy Daughter   . Healthy Son   . Healthy Sister   . Healthy Sister   . Healthy Sister   . Healthy Sister   . Healthy Daughter   . Healthy Son   . Healthy Son   . Healthy Son   . Healthy Son   . Healthy Son   . Healthy Son   Mother: Deceased: Age 54 years: DM/MI. Father: Deceased: Age 31 years: CAD/COPD Brothers (79): Estranged. Sisters (5): No known medical problems. There is no history of any blood disorders, bleeding tendency, or easy bruisability in the immediate family.  Social History   Socioeconomic History  . Marital status: Married    Spouse name: Not on file  . Number of children: Not on file  . Years of education: 69  . Highest education level: Not on file  Occupational History  . 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 Financial resource strain: Not on file  . Food insecurity:    Worry: Not on file    Inability: Not on file  . Transportation needs:    Medical: Not on file    Non-medical: Not on file  Tobacco Use  . Smoking status: Current Every Day Smoker    Packs/day: 0.30    Years: 60.00    Pack years: 18.00    Types: Cigarettes  . Smokeless tobacco: Former Systems developer    Types: Snuff, Chew  Substance and Sexual Activity   . Alcohol use: Yes    Alcohol/week: 5.0 standard drinks    Types: 5 Shots of liquor per week    Comment: 07/18/2018 Drinks 1/2 pint of gin over the weekend but does not drink everyday. Denies hx of blackouts, seizures, tremors.  . Drug use: No  . Sexual activity: Not Currently    Birth control/protection: None  Lifestyle  . Physical activity:    Days per week: Not on file    Minutes per session: Not on file  . Stress: Not on file  Relationships  . Social connections:    Talks on phone: Not on file    Gets together: Not on file    Attends religious service: Not on file    Active member of club or organization: Not on file    Attends meetings of clubs or organizations: Not  on file    Relationship status: Not on file  . Intimate partner violence:    Fear of current or ex partner: Not on file    Emotionally abused: Not on file    Physically abused: Not on file    Forced sexual activity: Not on file  Other Topics Concern  . Not on file  Social History Narrative   Married, lives with his wife in [place]. 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 Only one son and one daughter live in [place]. No pets.  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 He is married for the past 31 years.   He began smoking at the age of 73 years. He smoked a maximum 2 packs of cigarettes daily. He currently smokes 1/2 pack of cigarettes daily. No significant use of pipe or cigars. He chewed tobacco for a year in the distant past, now none. He is a recovered alcoholic.  He no longer consumes alcoholic beverages. He reports no recreational drug use.  Transfusion History: No prior transfusion  Exposure History: He reports no known exposure to toxic chemicals, radiation, or pesticides.  Allergies  Allergen Reactions  . Nicotine Transdermal System [Nicotine] Other (See Comments)    Vivid dreams    Current Outpatient Medications on File Prior to Visit  Medication Sig  .  allopurinol (ZYLOPRIM) 100 MG tablet Take 1 tablet (100 mg total) by mouth daily.  Marland Kitchen atorvastatin (LIPITOR) 20 MG tablet Take 1 tablet (20 mg total) by mouth daily.  . B Complex-C-Zn-Folic Acid (DIALYVITE/ZINC) TABS Take 1 tablet by mouth daily.  . finasteride (PROSCAR) 5 MG tablet Take 1 tablet (5 mg total) by mouth daily.  . fluticasone (FLONASE) 50 MCG/ACT nasal spray Place 1 spray into both nostrils as needed for allergies or rhinitis.  Marland Kitchen labetalol (NORMODYNE) 200 MG tablet Take 200 mg by mouth 2 (two) times daily.  . Darbepoetin Alfa (ARANESP) 100 MCG/0.5ML SOSY injection Inject 0.5 mLs (100 mcg total) into the vein every Thursday with hemodialysis. (Patient not taking: Reported on 09/25/2018)  . ferric gluconate 250 mg in sodium chloride 0.9 % 100 mL Inject 250 mg into the vein every Monday, Wednesday, and Friday with hemodialysis. (Patient not taking: Reported on 09/25/2018)  . heparin 1000 unit/mL SOLN injection 1 mL (1,000 Units total) by Dialysis route as needed (in dialysis). (Patient not taking: Reported on 09/25/2018)  . lidocaine-prilocaine (EMLA) cream Apply 1 application topically as needed (topical anesthesia for hemodialysis if G[place]ers and Lidocaine injection are ineffective.). (Patient not taking: Reported on 09/25/2018)  . pentafluoroprop-tetrafluoroeth (GEBAUERS) AERO Apply 1 application topically as needed (topical anesthesia for hemodialysis). (Patient not taking: Reported on 09/25/2018)  . sodium chloride 0.9 % infusion Inject 250 mLs into the vein as needed (for IV line care(Saline / Heparin Lock)). (Patient not taking: Reported on 09/25/2018)  . sodium chloride 0.9 % infusion Inject 100 mLs into the vein as needed (symptomatic hypotension or decrease in SBP < 90 mmHg). (Patient not taking: Reported on 09/25/2018)  . sodium chloride 0.9 % infusion Inject 100 mLs into the vein as needed (severe cramping). (Patient not taking: Reported on 09/25/2018)   No current  facility-administered medications on file prior to visit.     Review of Systems: Constitutional: No fever, sweats, or shaking chills.  His appetite is only fair.  He has lost 40 pounds since August/HD.  His energy level is low. Skin: No rash, scaling, sores, lumps, or jaundice. HEENT: No new  visual changes or hearing deficit. Pulmonary: No unusual cough, sore throat, or orthopnea; COPD; DOE. Cardiovascular: Coronary artery disease and prior myocardial infarction.  No active angina.  No cardiac dysrhythmia; essential hypertension and dyslipidemia. Gastrointestinal: No indigestion, dysphagia, abdominal pain, diarrhea, or constipation.  No change in bowel habits.  No nausea or vomiting.  No melena or bright red blood per rectum. Genitourinary: He is on hemodialysis since August.  He is not anuric.  No urinary frequency, urgency, hematuria, or dysuria; benign prostatic hypertrophy. Musculoskeletal: He has chronic pain in his feet which he attributes to ingrown toenails; hyperuricemia.  No new arthralgias or myalgias; no joint swelling, pain, or instability. Hematologic: No bleeding tendency or easy bruisability; anemia; mild leukopenia without neutropenia. Endocrine: No intolerance to heat or cold; no thyroid disease; diabetes mellitus. Vascular: No peripheral arterial; previous right lower extremity DVT and pulmonary embolism in 2014. Psychological: No anxiety, depression, or mood changes; no mental health illnesses. Neurological: No dizziness, lightheadedness, syncope, or near syncopal episodes; no numbness or tingling in the fingers or toes.  Physical Examination: Vital Signs: Body surface area is 1.95 meters squared.  Vitals:   09/25/18 1100 09/25/18 1235  BP: (!) 198/70 132/65  Pulse: 84 68  Resp: 20   SpO2: 100%     Filed Weights   09/25/18 1100  Weight: 176 lb 12.8 oz (80.2 kg)  Repeat BP: 140/80 after taking his morning medication. ECOG PERFORMANCE STATUS: 1 Constitutional:  Coye Dawood is fully nourished and developed.  He looks age appropriate.  He is friendly and cooperative without respiratory compromise at rest. Skin: No rashes, scaling, dryness, jaundice, or itching. HEENT: Head is normocephalic and atraumatic.  Pupils are equal round and reactive to light and accommodation.  He has bilateral arcus senilis.  No nystagmus is present.  Sclerae are anicteric.  Conjunctivae are pink.  No sinus tenderness nor oropharyngeal lesions.  Lips without cracking or peeling; tongue without mass, inflammation, or nodularity.  Poor dentition.  Mucous membranes are moist. Neck: Supple and symmetric.  No jugular venous distention or thyromegaly.  Trachea is midline. Lymphatics: No cervical or supraclavicular lymphadenopathy.  No epitrochlear, axillary, or inguinal lymphadenopathy is appreciated. Respiratory/chest: Thorax is symmetrical.  Breath sounds are clear to auscultation and percussion.  Normal excursion and respiratory effort. Back: Symmetric without deformity or tenderness. Cardiovascular: Heart rate and rhythm are regular without murmurs. Gastrointestinal: Abdomen is soft, nontender; no organomegaly.  Bowel sounds are normoactive.  No masses are appreciated. Genitourinary: Normal external male genitalia. Rectal examination: Not performed. Extremities: In the lower extremities, there is no asymmetric swelling, erythema, tenderness, or cord formation.  No clubbing, cyanosis, nor edema. Hematologic: No petechiae, hematomas, or ecchymoses. Psychological:  He is oriented to person, place, and time; normal affect. Neurological: There are no gross neurologic deficits.  Laboratory Results: I have reviewed the data as listed: September 25, 2018  Ref Range & Units 12:49  WBC Count 4.0 - 10.5 K/uL 3.6Low    RBC 4.22 - 5.81 MIL/uL 3.81Low    Hemoglobin 13.0 - 17.0 g/dL 11.2Low    HCT 39.0 - 52.0 % 35.5Low    MCV 80.0 - 100.0 fL 93.2   MCH 26.0 - 34.0 pg 29.4   MCHC 30.0 - 36.0 g/dL  31.5   RDW 11.5 - 15.5 % 13.9   Platelet Count 150 - 400 K/uL 181   nRBC 0.0 - 0.2 % 0.0   Neutrophils Relative % % 47   Neutro Abs 1.7 - 7.7  K/uL 1.7   Lymphocytes Relative % 40   Lymphs Abs 0.7 - 4.0 K/uL 1.4   Monocytes Relative % 10   Monocytes Absolute 0.1 - 1.0 K/uL 0.4   Eosinophils Relative % 2   Eosinophils Absolute 0.0 - 0.5 K/uL 0.1   Basophils Relative % 1   Basophils Absolute 0.0 - 0.1 K/uL 0.0   Immature Granulocytes % 0   Abs Immature Granulocytes 0.00 - 0.07 K/uL    Ref Range & Units 12:49   Sodium 135 - 145 mmol/L 140   Potassium 3.5 - 5.1 mmol/L 3.5   Chloride 98 - 111 mmol/L 98   CO2 22 - 32 mmol/L 30   Glucose, Bld 70 - 99 mg/dL 103High    BUN 8 - 23 mg/dL 23   Creatinine, Ser 0.61 - 1.24 mg/dL 4.26High Panic    Comment: CRITICAL RESULT CALLED TO, READ BACK BY AND VERIFIED WITH: Dr. Truddie Coco [id number]   Calcium 8.9 - 10.3 mg/dL 9.1   Total Protein 6.5 - 8.1 g/dL 7.8   Albumin 3.5 - 5.0 g/dL 3.4Low    AST 15 - 41 U/L 20   ALT 0 - 44 U/L 19   Alkaline Phosphatase 38 - 126 U/L 139High    Total Bilirubin 0.3 - 1.2 mg/dL 0.4   GFR calc non Af Amer >60 mL/min 13Low    GFR calc Af Amer >60 mL/min 15Low    LDH 184  CBC Latest Ref Rng & Units 09/25/2018 08/19/2018 07/25/2018  WBC 4.0 - 10.5 K/uL 3.6(L) 4.9 6.8  Hemoglobin 13.0 - 17.0 g/dL 11.2(L) 10.4(L) 8.0(L)  Hematocrit 39.0 - 52.0 % 35.5(L) 34.4(L) 26.0(L)  Platelets 150 - 400 K/uL 181 225 164   CMP Latest Ref Rng & Units 09/25/2018 08/19/2018 07/25/2018  Glucose 70 - 99 mg/dL 103(H) 130(H) 229(H)  BUN 8 - 23 mg/dL 23 30(H) 94(H)  Creatinine 0.61 - 1.24 mg/dL 4.26(HH) 6.78(H) 7.82(H)  Sodium 135 - 145 mmol/L 140 137 134(L)  Potassium 3.5 - 5.1 mmol/L 3.5 3.4(L) 4.4  Chloride 98 - 111 mmol/L 98 97(L) 93(L)  CO2 22 - 32 mmol/L _0 Calcium 8.9 - 10.3 mg/dL 9.1 9.0 8.8(L)  Total Protein 6.5 - 8.1 g/dL 7.8 7.5 -  Total Bilirubin 0.3 - 1.2 mg/dL 0.4 0.6 -  Alkaline Phos 38 - 126 U/L 139(H) 118 -  AST 15 - 41 U/L  20 25 -  ALT 0 - 44 U/L 19 19 -   Diagnostic/Imaging Studies: CT ABDOMEN AND PELVIS WITH CONTRAST  CONTRAST:  168m ISOVUE-300 IOPAMIDOL (ISOVUE-300) INJECTION 61%  COMPARISON:  02/09/2011 CT without contrast  FINDINGS: Lower chest: Previous median sternotomy. Dialysis catheter tips extend into the SVC RA junction. Normal heart size. No pericardial effusion. Native coronary atherosclerosis noted. Symmetric gynecomastia present.  Small right pleural effusion with pleural thickening and chronic appearing right basilar atelectasis/scarring. Minor left basilar atelectasis. Degenerative changes of the lower thoracic spine.  Hepatobiliary: No focal liver abnormality is seen. No gallstones, gallbladder wall thickening, or biliary dilatation.  Pancreas: Mild atrophy of the distal pancreas body and tail without definite ductal dilatation or focal abnormality by CT. This remains nonspecific. No surrounding inflammatory process or fluid collection.  Spleen: Normal in size without focal abnormality.  Adrenals/Urinary Tract: Stable nodular thickening of the left adrenal gland, suspect adenoma. Right adrenal gland unremarkable. Stable left renal hypodense cysts. Renal vascular calcifications noted. No hydronephrosis obstruction. Ureters are symmetric and decompressed. Bladder is collapsed.  Stomach/Bowel: Negative  for bowel obstruction, significant dilatation, ileus, free air. Portions of the appendix are demonstrated in the right lower quadrant and appear normal. No fluid collection or abscess.  Vascular/Lymphatic: Abdominal atherosclerosis noted. Negative for aneurysm or occlusive process. No retroperitoneal hemorrhage or hematoma. No adenopathy.  Reproductive: Prostate gland enlarged. Seminal vesicles are prominent. No other acute finding by CT.  Other: No abdominal wall hernia or abnormality. No abdominopelvic ascites.  Musculoskeletal: Degenerative changes of  the spine. No acute osseous finding.  IMPRESSION: No acute intra-abdominal or pelvic finding. Normal appendix demonstrated.  Chronic appearing right pleural effusion with pleural thickening and right basilar atelectasis/scarring.  Abdominal atherosclerosis without aneurysm. No acute vascular process  Stable left adrenal hypodense nodular mass, suspect small adenoma  Stable left renal cysts  Nonspecific pancreatic distal body and tail atrophy without ductal dilatation or focal abnormality. No surrounding inflammatory process. Consider further evaluation with follow-up nonemergent abdominal MRI  Prostate enlargement  M.  Shick M.D. 08/19/2018 12:28  Summary/Assessment: In the setting of anemia and transient thrombocytopenia, he presents now for further diagnostic and therapeutic recommendations.  On July 18, 2018 he was admitted to [facility] with end-stage renal disease where he was started on hemodialysis.  He had a 47-monthprogressive worsening of bilateral leg edema with increasing exertional dyspnea.  He responded initially to aggressive diuretic therapy.  When admitted he complained of lower back pain which resolved following hemodialysis.  Under the direction of Dr. MEdrick Oh nephrology, hemodialysis was [facility] on an [facility] basis Monday[facility] Wednesday[facility]  In the supportive/adjunct role, he receives darbepoetin, ferric gluconate, and Calcitrol.  On May 09, 2018: A complete blood count showed hemoglobin 9.3 hematocrit 28.4 WBC 3.3; platelets 133,000.    On June 22, 2018 prior to his hospitalization for acute on chronic kidney failure, a complete blood count showed hemoglobin 9.0 hematocrit 28.0 MCV 85 MCH 27.4 RDW 16.9 WBC 2.9 with 55% neutrophils 36% lymphocytes 8% monocytes 1% eosinophil; platelets 102,000.  On August 19, 2018: A complete blood count showed hemoglobin 10.4 hematocrit 34.4 MCV 98 MCH 29.7 RDW  15.4 WBC 4.9 with 65% neutrophils 25% lymphocytes 8% monocytes 1% eosinophils 1% basophils; platelets 225,000.  [facility] A complete blood count showed hemoglobin 10.6 hematocrit 36.8 WBC 3.63; platelets 150,000.  Serum iron 52 TIBC 261 iron saturation 20%.  Serology for hepatitis B was nonreactive.  Although he took metformin for diabetes in the past, he is no longer on diabetic medication. He has no cardiac dysrhythmia or chest pain.  He denies thyroid disease.  He has no seizure disorder or stroke syndrome.  There is no gastroesophageal reflux or peptic ulcer disease.  He denies viral hepatitis, inflammatory bowel disease, or symptomatic diverticulosis.  His most recent colonoscopy was approximately 2 years ago.  Benign polyps were removed and retrieved.  He reports no bleeding tendency.  Although his uric acid has been elevated, he denies gouty arthritis.  He has no rheumatoid disease.  In November, 2014 he had a right lower extremity deep vein thrombosis and pulmonary embolism.  He denies paresthesias.  In the past, he was taking rivaroxaban.  He is no longer on anticoagulation. Since starting hemodialysis, he has lost nearly 40 pounds.    His appetite waxes and wanes.  He denies dizziness, lightheadedness, syncope, or near syncopal episodes.  His energy level is fair.  He reports no new visual changes or hearing deficit.  He  denies cough, sore throat, or orthopnea.  There is no rash or itching.  He denies dyspnea at rest.  He has chronic but stable exertional dyspnea unchanged over his baseline.  No fever, shaking chills, sweats, or flulike symptoms.  He has no heartburn or indigestion.  There is no nausea, vomiting, diarrhea or constipation.  He generally moves his bowels twice daily without difficulty.    He reports no melena or bright red blood per rectum.   He is not anuric.No urinary frequency, urgency, hematuria, or dysuria are reported.  He reports no  swelling of his ankles.  There is no bleeding tendency or easy bruisability.  He has no new focal areas of bone, joint, or muscle pain.  The area of his right foot is tender due to an ingrown toenail.  He is scheduled to see his podiatrist on October 24.  He denies any numbness or tingling in the fingers or toes.  It is with this background he presents now for further diagnostic and therapeutic recommendations in the setting of anemia of chronic kidney disease and transient thrombocytopenia to exclude any confounding or contributing factors.  His other comorbid problems include end-stage renal disease on hemodialysis since August, 2019; allergic rhinitis; anemia of chronic kidney failure (stage IV); benign prostatic hypertrophy; chronic obstructive pulmonary disease; coronary artery disease and prior non-STEMI in August 2006; prior coronary artery bypass graft in August, 2006; deep vein thrombosis within the right lower extremity 5 years ago; chronic obstructive pulmonary disease primary hypertension; hyperuricemia; benign prostatic hypertrophy; erectile dysfunction; dyslipidemia; active tobacco dependence; peripheral arterial disease; diabetic retinopathy; "burned-out" non-insulin requiring diabetes mellitus; and former alcohol dependence (recovered alcoholic).    Recommendation/Plan: The results of his laboratory studies were not available at the time of discharge.  Some of those results are outlined above.  His platelet count is normal.  He remains anemic.  Although his absolute white blood cell count is low, he is not neutropenic.  He has no recent infection or flulike symptoms.  We discussed in lengthy detail his prior complex history, importance, role, rationale, and methodology for evaluating anemia in the setting of end-stage renal disease and current hemodialysis.  His red blood cell indices are normal.  The white blood cell manual differential shows a normal white blood cell distribution.  An  official review of his peripheral blood smear was requested through hematopathology.  A battery of laboratory studies were obtained to exclude an underlying inflammatory/infectious etiology, metabolic anomaly, nutritional deficiency, hemolytic process, or paraproteinemia.  Those results are pending.  It was strongly recommended that he consider discontinuing cigarette use.  He should consider pharmacologic and/or behavioral intervention through his primary care provider.  Barring any unforeseen complications, his next scheduled doctor visit to discuss those results is on October 24.  Both Agamjot and his wife Posey Pronto were advised to call us in the interim should any new or untoward problems arise.  The total time spent discussing his complex prior history, importance and methodology of evaluating his anemia, imaging studies, preliminary considerations, and recommendations was 60 minutes.  At least 50% of that time was spent in discussion, reviewing outside records, laboratory evaluation, counseling, and answering questions. All questions were answered to his satisfaction.   This note was dictated using voice activated technology/software.  Unfortunately, typographical errors are not uncommon, and transcription is subject to mistakes and regrettably misinterpretation.  If necessary, clarification of the above information can be discussed with me at any time.  Thank you Drs. Justin Mend and  Eppie Gibson for allowing my participation in the care of Tashan Kreitzer. I will keep you closely informed as the results of his preliminary laboratory data become available.  Please do not hesitate to call should any questions arise regarding this initial consultation and discussion.  FOLLOW UP: AS DIRECTED   cc:       Edrick Oh, MD             Oval Linsey, MD   Henreitta Leber, MD  Hematology/Oncology [place] [address] [place],  [address] Office: [phone] PNTI: [phone]

## 2018-09-25 NOTE — Patient Instructions (Signed)
September 25, 2018 We discussed in detail your previous laboratory studies from July, 2019 which suggested a low platelet count in addition to anemia.  It appears however that the platelet count has normalized since starting hemodialysis.  Laboratory studies will be obtained today to exclude any potential contributing factors to your anemia such as nutritional deficiency, metabolic abnormality, premature destruction of red blood cells, abnormal protein in the blood, or thyroid dysfunction.  Barring any unforeseen complications, your next scheduled doctor visit to discuss those results he is on October 11, 2018.  Please do not hesitate to call should any questions arise in the interim.  Thank you! Ladona Ridgel, MD Hematology/Oncology [phone]

## 2018-09-26 DIAGNOSIS — N2581 Secondary hyperparathyroidism of renal origin: Secondary | ICD-10-CM | POA: Diagnosis not present

## 2018-09-26 DIAGNOSIS — N186 End stage renal disease: Secondary | ICD-10-CM | POA: Diagnosis not present

## 2018-09-26 LAB — IMMUNOFIXATION ELECTROPHORESIS
IGA: 465 mg/dL — AB (ref 61–437)
IGG (IMMUNOGLOBIN G), SERUM: 1513 mg/dL (ref 700–1600)
IgM (Immunoglobulin M), Srm: 37 mg/dL (ref 15–143)
Total Protein ELP: 7.2 g/dL (ref 6.0–8.5)

## 2018-09-26 LAB — KAPPA/LAMBDA LIGHT CHAINS
Kappa free light chain: 148.2 mg/L — ABNORMAL HIGH (ref 3.3–19.4)
Kappa, lambda light chain ratio: 1.22 (ref 0.26–1.65)
Lambda free light chains: 121.9 mg/L — ABNORMAL HIGH (ref 5.7–26.3)

## 2018-09-26 LAB — TSH: TSH: 1.079 u[IU]/mL (ref 0.320–4.118)

## 2018-09-26 LAB — HAPTOGLOBIN: Haptoglobin: 227 mg/dL — ABNORMAL HIGH (ref 34–200)

## 2018-09-27 DIAGNOSIS — L97512 Non-pressure chronic ulcer of other part of right foot with fat layer exposed: Secondary | ICD-10-CM | POA: Diagnosis not present

## 2018-09-27 DIAGNOSIS — I739 Peripheral vascular disease, unspecified: Secondary | ICD-10-CM | POA: Diagnosis not present

## 2018-09-27 LAB — PATHOLOGIST SMEAR REVIEW

## 2018-09-28 DIAGNOSIS — N186 End stage renal disease: Secondary | ICD-10-CM | POA: Diagnosis not present

## 2018-09-28 DIAGNOSIS — N2581 Secondary hyperparathyroidism of renal origin: Secondary | ICD-10-CM | POA: Diagnosis not present

## 2018-10-01 DIAGNOSIS — N2581 Secondary hyperparathyroidism of renal origin: Secondary | ICD-10-CM | POA: Diagnosis not present

## 2018-10-01 DIAGNOSIS — N186 End stage renal disease: Secondary | ICD-10-CM | POA: Diagnosis not present

## 2018-10-02 ENCOUNTER — Ambulatory Visit (INDEPENDENT_AMBULATORY_CARE_PROVIDER_SITE_OTHER): Payer: Self-pay | Admitting: Physician Assistant

## 2018-10-02 ENCOUNTER — Other Ambulatory Visit: Payer: Self-pay

## 2018-10-02 ENCOUNTER — Ambulatory Visit (HOSPITAL_COMMUNITY)
Admission: RE | Admit: 2018-10-02 | Discharge: 2018-10-02 | Disposition: A | Discharge status: Home / Self Care | Payer: Medicare HMO | Source: Ambulatory Visit | Attending: Vascular Surgery | Admitting: Vascular Surgery

## 2018-10-02 VITALS — BP 123/71 | HR 71 | Temp 99.3°F | Resp 16 | Ht 67.0 in | Wt 171.0 lb

## 2018-10-02 DIAGNOSIS — N186 End stage renal disease: Secondary | ICD-10-CM

## 2018-10-02 DIAGNOSIS — Z992 Dependence on renal dialysis: Secondary | ICD-10-CM

## 2018-10-02 NOTE — Progress Notes (Signed)
POST OPERATIVE OFFICE NOTE    CC:  F/u for surgery  HPI:  This is a 72 y.o. male who is s/p Right brachiocephalic arteriovenous fistula and TDC placement.  He was seen in our office on 09/04/2018 to check maturity of the fistula.  On duplex evaluation the diameter ranges from 0.44cm-0.55cm and depth 0.21cm-0.84cm.  He is here today for repeat duplex to check for maturity.  He denise pain, numbness and loss of motor.    Allergies  Allergen Reactions  . Nicotine Transdermal System [Nicotine] Other (See Comments)    Vivid dreams    Current Outpatient Medications  Medication Sig Dispense Refill  . allopurinol (ZYLOPRIM) 100 MG tablet Take 1 tablet (100 mg total) by mouth daily. 30 tablet 3  . atorvastatin (LIPITOR) 20 MG tablet Take 1 tablet (20 mg total) by mouth daily. 90 tablet 3  . B Complex-C-Zn-Folic Acid (DIALYVITE/ZINC) TABS Take 1 tablet by mouth daily.  2  . Darbepoetin Alfa (ARANESP) 100 MCG/0.5ML SOSY injection Inject 0.5 mLs (100 mcg total) into the vein every Thursday with hemodialysis.    . ferric gluconate 250 mg in sodium chloride 0.9 % 100 mL Inject 250 mg into the vein every Monday, Wednesday, and Friday with hemodialysis.    . finasteride (PROSCAR) 5 MG tablet Take 1 tablet (5 mg total) by mouth daily. 30 tablet 11  . fluticasone (FLONASE) 50 MCG/ACT nasal spray Place 1 spray into both nostrils as needed for allergies or rhinitis.    . heparin 1000 unit/mL SOLN injection 1 mL (1,000 Units total) by Dialysis route as needed (in dialysis).    . labetalol (NORMODYNE) 200 MG tablet Take 200 mg by mouth 2 (two) times daily.    Marland Kitchen lidocaine-prilocaine (EMLA) cream Apply 1 application topically as needed (topical anesthesia for hemodialysis if Gebauers and Lidocaine injection are ineffective.).    Marland Kitchen pentafluoroprop-tetrafluoroeth (GEBAUERS) AERO Apply 1 application topically as needed (topical anesthesia for hemodialysis).  0  . sodium chloride 0.9 % infusion Inject 250 mLs into  the vein as needed (for IV line care(Saline / Heparin Lock)).  0  . sodium chloride 0.9 % infusion Inject 100 mLs into the vein as needed (symptomatic hypotension or decrease in SBP < 90 mmHg).  0  . sodium chloride 0.9 % infusion Inject 100 mLs into the vein as needed (severe cramping).  0  . furosemide (LASIX) 80 MG tablet      No current facility-administered medications for this visit.      ROS:  See HPI  Physical Exam:  Vitals:   10/02/18 1539  BP: 123/71  Pulse: 71  Resp: 16  Temp: 99.3 F (37.4 C)  SpO2: 100%    Incision:  Incisions is well healed right UE AC Extremities:  Palpable radial pulse right UE, palpable thrill in fistula.  Grip 5/5 and sensation intact and equal B UE>  Fistula duplex Diameter 0.43-0.52-0.64 Depth < 0.6 from the proximal upper arm  Assessment/Plan:  This is a 72 y.o. male who is s/p:Right brachiocephalic arteriovenous fistula and catheter placement.  The fistula has a good thrill and is very visible over the bicep area of his right UE.  The diameter has improved since his last visit.  He is currently on HD vis right IJ TDC without problems.  The right UE fistula may be access in 4 weeks 10/23/2018.  If there are any problems he will call our office other sie f/u PRN.    Roxy Horseman ,  PA-C [facility] [phone]

## 2018-10-03 DIAGNOSIS — N186 End stage renal disease: Secondary | ICD-10-CM | POA: Diagnosis not present

## 2018-10-03 DIAGNOSIS — N2581 Secondary hyperparathyroidism of renal origin: Secondary | ICD-10-CM | POA: Diagnosis not present

## 2018-10-05 ENCOUNTER — Other Ambulatory Visit: Payer: Self-pay

## 2018-10-05 DIAGNOSIS — N2581 Secondary hyperparathyroidism of renal origin: Secondary | ICD-10-CM | POA: Diagnosis not present

## 2018-10-05 DIAGNOSIS — N186 End stage renal disease: Secondary | ICD-10-CM | POA: Diagnosis not present

## 2018-10-05 DIAGNOSIS — I739 Peripheral vascular disease, unspecified: Secondary | ICD-10-CM

## 2018-10-08 DIAGNOSIS — N186 End stage renal disease: Secondary | ICD-10-CM | POA: Diagnosis not present

## 2018-10-08 DIAGNOSIS — N2581 Secondary hyperparathyroidism of renal origin: Secondary | ICD-10-CM | POA: Diagnosis not present

## 2018-10-10 DIAGNOSIS — N186 End stage renal disease: Secondary | ICD-10-CM | POA: Diagnosis not present

## 2018-10-10 DIAGNOSIS — N2581 Secondary hyperparathyroidism of renal origin: Secondary | ICD-10-CM | POA: Diagnosis not present

## 2018-10-11 ENCOUNTER — Encounter: Payer: Self-pay | Admitting: Hematology and Oncology

## 2018-10-11 ENCOUNTER — Inpatient Hospital Stay (HOSPITAL_BASED_OUTPATIENT_CLINIC_OR_DEPARTMENT_OTHER): Payer: Medicare HMO | Admitting: Hematology and Oncology

## 2018-10-11 ENCOUNTER — Telehealth: Payer: Self-pay | Admitting: Hematology and Oncology

## 2018-10-11 VITALS — BP 161/63 | HR 88 | Temp 97.7°F | Resp 18 | Wt 170.0 lb

## 2018-10-11 DIAGNOSIS — Z86711 Personal history of pulmonary embolism: Secondary | ICD-10-CM

## 2018-10-11 DIAGNOSIS — Z992 Dependence on renal dialysis: Secondary | ICD-10-CM | POA: Diagnosis not present

## 2018-10-11 DIAGNOSIS — I132 Hypertensive heart and chronic kidney disease with heart failure and with stage 5 chronic kidney disease, or end stage renal disease: Secondary | ICD-10-CM | POA: Diagnosis not present

## 2018-10-11 DIAGNOSIS — N186 End stage renal disease: Secondary | ICD-10-CM | POA: Diagnosis not present

## 2018-10-11 DIAGNOSIS — D631 Anemia in chronic kidney disease: Secondary | ICD-10-CM | POA: Diagnosis not present

## 2018-10-11 DIAGNOSIS — Z86718 Personal history of other venous thrombosis and embolism: Secondary | ICD-10-CM | POA: Diagnosis not present

## 2018-10-11 DIAGNOSIS — D649 Anemia, unspecified: Secondary | ICD-10-CM

## 2018-10-11 DIAGNOSIS — D72818 Other decreased white blood cell count: Secondary | ICD-10-CM | POA: Diagnosis not present

## 2018-10-11 DIAGNOSIS — F17210 Nicotine dependence, cigarettes, uncomplicated: Secondary | ICD-10-CM

## 2018-10-11 DIAGNOSIS — L97512 Non-pressure chronic ulcer of other part of right foot with fat layer exposed: Secondary | ICD-10-CM | POA: Diagnosis not present

## 2018-10-11 NOTE — Patient Instructions (Signed)
We discussed in detail the results of your previous laboratory studies.  Those results suggest improvement in white blood cell count and anemia.  Your platelet count is normal.  There is no evidence of premature destruction of cells, nutritional deficiency, abnormal protein in the blood, thyroid dysfunction, or metabolic anomaly contributing either to your anemia, low white blood cell count, or kidney function.  Removal of the metabolic waste products associated with kidney function have also improved with hemodialysis.  Although your white blood cell count is slightly low, it has improved.  Generally African-Americans have a lower white blood cell count than normal.  You have however, enough of the normal white blood cells to protect you from infection.  You should stay up-to-date with all of your immunizations.  Barring any unforeseen complications, your next scheduled doctor visit with laboratory studies is on December 19.  Please do not hesitate to call should any questions or problems arise in the interim.  Ladona Ridgel, MD Hematology/Oncology

## 2018-10-11 NOTE — Progress Notes (Signed)
[facility] Outpatient Progress Note  Patient Name:  Kevin Castillo  DOB: Jul 20, 1946   Date of Service:  Referring Physician: Edrick Oh, MD Nephrology  Primary Care Physician: Oval Linsey, MD [address] [place] [address]   Consulting Physician: Henreitta Leber, MD Hematology/Oncology  Reason for Visit: In the setting of anemia, mild leukopenia, and transient thrombocytopenia, he presents now for the results of his preliminary laboratory evaluation and recommendations.  Brief History: Kevin Castillo is a 72 year old resident of [place] whose past medical history is significant for end-stage renal disease on hemodialysis since August, 2019; allergic rhinitis; anemia of chronic kidney failure (stage IV); benign prostatic hypertrophy; chronic obstructive pulmonary disease; coronary artery disease and prior non-STEMI in August 2006; prior coronary artery bypass graft in August, 2006; deep vein thrombosis within the right lower extremity 5 years ago; chronic obstructive pulmonary disease; primary hypertension; hyperuricemia; benign prostatic hypertrophy; erectile dysfunction; dyslipidemia; active tobacco dependence; peripheral arterial disease; diabetic retinopathy; "burned-out" non-insulin requiring diabetes mellitus; and former alcohol dependence (recovered alcoholic).  His primary care physician is Dr. Oval Linsey.  He is accompanied by his attentive wife Kevin Castillo.  On July 18, 2018 he was admitted to [facility] with end-stage renal disease where he was started on hemodialysis.  He had a 28-month progressive worsening of bilateral leg edema with increasing exertional dyspnea.  He responded initially to aggressive diuretic therapy.  He admitted he complained of lower back pain which resolved following hemodialysis.  Under the direction of Dr. Edrick Oh, nephrology, hemodialysis was continued on an outpatient basis  Monday Wednesday and Friday including darbepoetin, ferric gluconate, and Calcitrol.  On May 09, 2018 a complete blood count showed hemoglobin 9.3 hematocrit 28.4 WBC 3.3; platelets 133,000.    On June 22, 2018 prior to his hospitalization, a complete blood count showed hemoglobin 9.0 hematocrit 28.0 MCV 85 MCH 27.4 RDW 16.9 WBC 2.9 with 55% neutrophils 36% lymphocytes 8% monocytes 1% eosinophil; platelets 102,000.  On August 19, 2018: A complete blood count showed hemoglobin 10.4 hematocrit 34.4 MCV 98 MCH 29.7 RDW 15.4 WBC 4.9 with 65% neutrophils 25% lymphocytes 8% monocytes 1% eosinophils 1% basophils; platelets 225,000.    At [facility] on August 29, 2018: A complete blood count showed hemoglobin 10.6 hematocrit 36.8 WBC 3.63; platelets 150,000.  Serum iron 52 TIBC 261 iron saturation 20%. Serology for hepatitis B was nonreactive.  Although he took metformin in the past, he is no longer on diabetic medication. His most recent colonoscopy was approximately 2 years ago.  Benign polyps were removed and retrieved. Although his uric acid has been elevated he denies gouty arthritis. In November, 2014 he had a right lower extremity deep vein thrombosis and pulmonary embolism.  He denies any paresthesias. he was on rivaroxaban.  He is no longer on anticoagulation. Since starting hemodialysis, he has lost nearly 40 pounds of water weight.  At the time of his initial visit on October 8, we discussed in detail his prior complex history, importance, rationale, and methodology for evaluating anemia in the setting of end-stage renal disease and chronic hemodialysis.  His red blood cell indices were normal.  The white blood cell manual differential showed a normal distribution.  A peripheral blood smear reviewed by hematopathology revealed a normochromic and normocytic anemia. A battery of laboratory studies were obtained to exclude an underlying inflammatory/infectious etiology, metabolic anomaly,  nutritional deficiency, hemolytic process, or paraproteinemia.  Since his last visit, he has  not started any new medications. It is with this background he presents now for the results of his preliminary evaluation and recommendations.  Interval History: In the interim since his last visit, his appetite waxes and wanes.  He denies dizziness, lightheadedness, syncope, near syncopal episodes.  His energy level is fair.  He reports no new visual changes or hearing deficit.  He denies cough, sore throat, or orthopnea.  There is no rash or itching.  He denies dyspnea at rest.  He has chronic but stable exertional dyspnea unchanged over his baseline.  No fever, shaking chills, sweats, or flulike symptoms.  He has no heartburn or indigestion.  There is no nausea, vomiting, diarrhea or constipation.  He generally moves his bowels twice daily without difficulty. He reports no melena or bright red blood per rectum.   He is not anuric. No urinary frequency, urgency, hematuria, or dysuria are reported.  He reports no swelling of his ankles.  There is no bleeding tendency or easy bruisability.  He has no new focal areas of bone, joint, muscle pain.  The tenderness in his right foot due to an ingrown toenail is improved. He denies any numbness or tingling in the fingers or toes.    Past Medical History Reviewed        Family History Reviewed       Social History Reviewed      Past Medical History:  Diagnosis Date  . Acute left systolic heart failure ([place]) 01/06/2016  . Adenoma of left adrenal gland 06/16/2014   CT Abdomen (06/04/2014): Incidental, 20 mm, 8.33 HU suggesting benign adenoma   . Alcohol abuse 02/15/2007   Reports consuming 1 pint of gin on weekends, does not drink every day. Denies history of seizures, blackouts, or tremors.    . Allergic rhinitis 04/09/2014  . Anemia 11/12/2013   Unclear cause as of yet (? EtOH abuse)   . Anemia of chronic kidney failure, stage 4 (severe) ([place]) 07/28/2017  . Benign  prostatic hypertrophy 06/02/2014  . Chronic kidney disease, stage 3 ([place]) 11/12/2013  . Chronic systolic heart failure ([place]) 01/15/2016   Echo (01/07/2016): LVEF 35%  . COPD (chronic obstructive pulmonary disease) ([place])   . Coronary artery disease 02/15/2007   Non-STEMI 07/2005, s/p CABG x 4 (08/01/2005): LIMA to LAD, SVG to OM, RCA, PCA   . DVT (deep venous thrombosis) ([place]) 2006   RLE "when I had OHS"  . Erectile dysfunction associated with type 2 diabetes mellitus ([place]) 02/15/2007  . Essential hypertension 02/15/2007  . Hyperlipidemia 02/15/2007  . Major depression, single episode 11/02/2015  . Obesity (BMI 30.0-34.9) 02/15/2007  . Peripheral vascular occlusive disease ([place]) 12/30/2013   ABI (01/03/2014): Right 0.64, Left 0.64   . Pneumonia X 1  . Secondary hyperparathyroidism of renal origin ([place]) 07/28/2017  . Sigmoid diverticulosis 01/15/2016   Minimal per colonoscopy 01/11/2016  . Tobacco abuse 02/15/2007  . Tubular adenoma of colon 01/11/2016   Two 6 mm semi-pedunculated tubular adenomas excised endoscopically 01/11/2016.  Repeat colonoscopy due 12/2020.  . Type 2 diabetes mellitus with mild nonproliferative diabetic retinopathy with macular edema, bilateral ([place]) 10/28/2016  . Type 2 diabetes mellitus with stage 3 chronic kidney disease ([place]) 02/15/2007  . Venous thromboembolism 11/11/2013   LE Dopplers (01/2011): RLE DVT. CTA with mild subacute to chronic right-sided pulmonary emboli.  Patient has elected to continue anticoagulation.    Allergies  Allergen Reactions  . Nicotine Transdermal System [Nicotine] Other (See Comments)    Vivid dreams  Current Outpatient Medications on File Prior to Visit  Medication Sig  . allopurinol (ZYLOPRIM) 100 MG tablet Take 1 tablet (100 mg total) by mouth daily.  Marland Kitchen atorvastatin (LIPITOR) 20 MG tablet Take 1 tablet (20 mg total) by mouth daily.  . B Complex-C-Zn-Folic Acid (DIALYVITE/ZINC) TABS Take 1 tablet by mouth daily.  . Darbepoetin Alfa  (ARANESP) 100 MCG/0.5ML SOSY injection Inject 0.5 mLs (100 mcg total) into the vein every Thursday with hemodialysis.  . ferric gluconate 250 mg in sodium chloride 0.9 % 100 mL Inject 250 mg into the vein every Monday, Wednesday, and Friday with hemodialysis.  . finasteride (PROSCAR) 5 MG tablet Take 1 tablet (5 mg total) by mouth daily.  . fluticasone (FLONASE) 50 MCG/ACT nasal spray Place 1 spray into both nostrils as needed for allergies or rhinitis.  . furosemide (LASIX) 80 MG tablet   . heparin 1000 unit/mL SOLN injection 1 mL (1,000 Units total) by Dialysis route as needed (in dialysis).  . labetalol (NORMODYNE) 200 MG tablet Take 200 mg by mouth 2 (two) times daily.  Marland Kitchen lidocaine-prilocaine (EMLA) cream Apply 1 application topically as needed (topical anesthesia for hemodialysis if Gebauers and Lidocaine injection are ineffective.).  Marland Kitchen pentafluoroprop-tetrafluoroeth (GEBAUERS) AERO Apply 1 application topically as needed (topical anesthesia for hemodialysis).  . sodium chloride 0.9 % infusion Inject 250 mLs into the vein as needed (for IV line care(Saline / Heparin Lock)).  Marland Kitchen sodium chloride 0.9 % infusion Inject 100 mLs into the vein as needed (symptomatic hypotension or decrease in SBP < 90 mmHg).  . sodium chloride 0.9 % infusion Inject 100 mLs into the vein as needed (severe cramping).   No current facility-administered medications on file prior to visit.     Review of Systems: Constitutional: No fever, sweats, or shaking chills.  His appetite is only fair.  He has lost 40 pounds (water weight) since August/HD.  His energy level is low but stable. Skin: No rash, scaling, sores, lumps, or jaundice. HEENT: No new visual changes or hearing deficit. Pulmonary: No unusual cough, sore throat, or orthopnea; COPD; DOE. Cardiovascular: Coronary artery disease and prior myocardial infarction.  No active angina.  No cardiac dysrhythmia; essential hypertension and dyslipidemia. Gastrointestinal:  No indigestion, dysphagia, abdominal pain, diarrhea, or constipation.  No change in bowel habits.  No nausea or vomiting.  No melena or bright red blood per rectum. Genitourinary: He is on hemodialysis since August.  He is not anuric.  No urinary frequency, urgency, hematuria, or dysuria; benign prostatic hypertrophy. Musculoskeletal: He has chronic pain in his feet which he attributes to ingrown toenails; hyperuricemia.  No new arthralgias or myalgias; no joint swelling, pain, or instability. Hematologic: No bleeding tendency or easy bruisability; anemia; mild leukopenia without neutropenia. Endocrine: No intolerance to heat or cold; no thyroid disease; diabetes mellitus. Vascular: No peripheral arterial; previous right lower extremity DVT and pulmonary embolism in 2014. Psychological: No anxiety, depression, or mood changes; no mental health illnesses. Neurological: No dizziness, lightheadedness, syncope, or near syncopal episodes; no numbness or tingling in the fingers or toes.  Physical Examination: Vital Signs: Body surface area is 1.91 meters squared.  Vitals:   10/11/18 1033  BP: (!) 161/63  Pulse: 88  Resp: 18  Temp: 97.7 F (36.5 C)  SpO2: 99%    Filed Weights   10/11/18 1033  Weight: 170 lb 1.6 oz (77.2 kg)  Body mass index is 26.64 kg/m. ECOG PERFORMANCE STATUS: 1 Constitutional:  Kevin Castillo is a fully nourished and  developed African-American.  He looks age appropriate.  He is friendly and cooperative without respiratory compromise at rest. Skin: No rashes, scaling, dryness, jaundice, or itching. HEENT: Head is normocephalic and atraumatic.  Pupils are equal round and reactive to light and accommodation.  He has bilateral arcus senilis.  No nystagmus is present.  Sclerae are anicteric.  Conjunctivae are pink.  No sinus tenderness nor oropharyngeal lesions.  Lips without cracking or peeling; tongue without mass, inflammation, or nodularity.  Poor dentition.  Mucous  membranes are moist. Neck: Supple and symmetric.  No jugular venous distention or thyromegaly.  Trachea is midline. Lymphatics: No cervical or supraclavicular lymphadenopathy.  No epitrochlear, axillary, or inguinal lymphadenopathy is appreciated. Respiratory/chest: Thorax is symmetrical.  Breath sounds are clear to auscultation and percussion.  Normal excursion and respiratory effort. Back: Symmetric without deformity or tenderness. Cardiovascular: Heart rate and rhythm are regular without murmurs. Gastrointestinal: Abdomen is soft, nontender; no organomegaly.  Bowel sounds are normoactive.  No masses are appreciated. Extremities: In the lower extremities, there is no asymmetric swelling, erythema, tenderness, or cord formation.  No clubbing, cyanosis, nor edema. Hematologic: No petechiae, hematomas, or ecchymoses. Psychological:  He is oriented to person, place, and time; normal affect. Neurological: There are no gross neurologic deficits.  Laboratory Results: September 25, 2018  Ref Range & Units 2wk ago (09/25/18) 42mo ago (08/19/18) 63mo ago (07/25/18) 25mo ago (07/23/18) 66mo ago (07/20/18)  WBC Count 4.0 - 10.5 K/uL 3.6Low   4.9  6.8  3.1Low   3.1Low    RBC 4.22 - 5.81 MIL/uL 3.81Low   3.50Low   2.80Low   3.09Low   2.93Low    Hemoglobin 13.0 - 17.0 g/dL 11.2Low   10.4Low   8.0Low   8.8Low   8.3Low    HCT 39.0 - 52.0 % 35.5Low   34.4Low   26.0Low   28.6Low   27.2Low    MCV 80.0 - 100.0 fL 93.2  98.3 R 92.9 R 92.6 R 92.8 R  MCH 26.0 - 34.0 pg 29.4  29.7  28.6  28.5  28.3   MCHC 30.0 - 36.0 g/dL 31.5  30.2  30.8  30.8  30.5   RDW 11.5 - 15.5 % 13.9  15.4  16.3High   16.4High   16.4High    Platelet Count 150 - 400 K/uL 181  225  164 CM 154 CM 150 CM  nRBC 0.0 - 0.2 % 0.0       Neutrophils Relative % % 47  65      Neutro Abs 1.7 - 7.7 K/uL 1.7  3.2      Lymphocytes Relative % 40  25      Lymphs Abs 0.7 - 4.0 K/uL 1.4  1.2      Monocytes Relative % 10  8      Monocytes Absolute 0.1 - 1.0 K/uL 0.4   0.4      Eosinophils Relative % 2  1      Eosinophils Absolute 0.0 - 0.5 K/uL 0.1  0.0 R     Basophils Relative % 1  1      Basophils Absolute 0.0 - 0.1 K/uL 0.0  0.0      Immature Granulocytes % 0  0      Abs Immature Granulocytes 0.00 - 0.07 K/uL 0.01  0.0 R, CM       Ref Range & Units 2wk ago (09/25/18) 6mo ago (08/19/18) 1mo ago (07/25/18) 80mo ago (07/23/18) 15mo ago (07/22/18)  Sodium  135 - 145 mmol/L 140  137  134Low   137  137   Potassium 3.5 - 5.1 mmol/L 3.5  3.4Low   4.4  4.6  4.4   Chloride 98 - 111 mmol/L 98  97Low   93Low   98  97Low    CO2 22 - 32 mmol/L 30  29  28  25  27    Glucose, Bld 70 - 99 mg/dL 103High   130High   229High   115High   138High    BUN 8 - 23 mg/dL 23  30High   94High   87High   83High    Creatinine, Ser 0.61 - 1.24 mg/dL 4.26High Panic   6.78High   7.82High   7.84High   7.79High    Comment: CRITICAL RESULT CALLED TO, READ BACK BY AND VERIFIED WITH: Dr. Truddie Coco [id number]   Calcium 8.9 - 10.3 mg/dL 9.1  9.0  8.8Low   9.2  9.2   Total Protein 6.5 - 8.1 g/dL 7.8  7.5      Albumin 3.5 - 5.0 g/dL 3.4Low   3.6  3.5     AST 15 - 41 U/L 20  25      ALT 0 - 44 U/L 19  19      Alkaline Phosphatase 38 - 126 U/L 139High   118      Total Bilirubin 0.3 - 1.2 mg/dL 0.4  0.6      GFR calc non Af Amer >60 mL/min 13Low   7Low   6Low   6Low   6Low    GFR calc Af Amer >60 mL/min 15Low   8Low  CM 7Low  CM 7Low  CM 7Low  CM  Comment: (NOTE)       Reticulocyte count 0.1% CRP 2.4 Folic acid 09.3 Vitamin B12 788 TSH 1.079 LDH 184 Haptoglobin 227 Serum protein electrophoresis, immunofixation, free kappa/lambda light chain analysis: A polyclonal gammopathy was identified. Kappa free light chain 148.2 Lambda free light chain 121.9 Kappa/lambda light chain ratio: 1.22 (0.26-1.65)    Path Review 09/27/2018   Comment: Reviewed By Violet Baldy, M.D.  NORMOCYTIC ANEMIA.  Performed at [facility], [place] [address], [place], [place] [address]     Diagnostic/Imaging Studies: CT ABDOMEN AND PELVIS WITH CONTRAST  CONTRAST: 157mL ISOVUE-300 IOPAMIDOL (ISOVUE-300) INJECTION 61%  COMPARISON: 02/09/2011 CT without contrast  FINDINGS: Lower chest: Previous median sternotomy. Dialysis catheter tips extend into the SVC RA junction. Normal heart size. No pericardial effusion. Native coronary atherosclerosis noted. Symmetric gynecomastia present.  Small right pleural effusion with pleural thickening and chronic appearing right basilar atelectasis/scarring. Minor left basilar atelectasis. Degenerative changes of the lower thoracic spine.  Hepatobiliary: No focal liver abnormality is seen. No gallstones, gallbladder wall thickening, or biliary dilatation.  Pancreas: Mild atrophy of the distal pancreas body and tail without definite ductal dilatation or focal abnormality by CT. This remains nonspecific. No surrounding inflammatory process or fluid collection.  Spleen: Normal in size without focal abnormality.  Adrenals/Urinary Tract: Stable nodular thickening of the left adrenal gland, suspect adenoma. Right adrenal gland unremarkable. Stable left renal hypodense cysts. Renal vascular calcifications noted. No hydronephrosis obstruction. Ureters are symmetric and decompressed. Bladder is collapsed.  Stomach/Bowel: Negative for bowel obstruction, significant dilatation, ileus, free air. Portions of the appendix are demonstrated in the right lower quadrant and appear normal. No fluid collection or abscess.  Vascular/Lymphatic: Abdominal atherosclerosis noted. Negative for aneurysm or occlusive process. No retroperitoneal hemorrhage or hematoma. No  adenopathy.  Reproductive: Prostate gland enlarged. Seminal vesicles are prominent. No other acute finding by CT.  Other: No abdominal wall hernia or abnormality. No abdominopelvic ascites.  Musculoskeletal: Degenerative changes of the spine. No acute  osseous finding.  IMPRESSION: No acute intra-abdominal or pelvic finding. Normal appendix demonstrated.  Chronic appearing right pleural effusion with pleural thickening and right basilar atelectasis/scarring.  Abdominal atherosclerosis without aneurysm. No acute vascular process  Stable left adrenal hypodense nodular mass, suspect small adenoma  Stable left renal cysts  Nonspecific pancreatic distal body and tail atrophy without ductal dilatation or focal abnormality. No surrounding inflammatory process. Consider further evaluation with follow-up nonemergent abdominal MRI  Prostate enlargement  M. Shick M.D. 08/19/2018 12:28  Summary/Assessment: In the setting of anemia, mild leukopenia, and transient thrombocytopenia, he presents now for the results of his preliminary laboratory evaluation and recommendations.  On July 18, 2018 he was admitted to [facility] with end-stage renal disease where he was started on hemodialysis.  He had a 28-month progressive worsening of bilateral leg edema with increasing exertional dyspnea.  He responded initially to aggressive diuretic therapy.  He admitted he complained of lower back pain which resolved following hemodialysis.  Under the direction of Dr. Edrick Oh, nephrology, hemodialysis was continued on an outpatient basis Monday Wednesday and Friday including darbepoetin, ferric gluconate, and Calcitrol.  On May 09, 2018 a complete blood count showed hemoglobin 9.3 hematocrit 28.4 WBC 3.3; platelets 133,000.    On June 22, 2018 prior to his hospitalization, a complete blood count showed hemoglobin 9.0 hematocrit 28.0 MCV 85 MCH 27.4 RDW 16.9 WBC 2.9 with 55% neutrophils 36% lymphocytes 8% monocytes 1% eosinophil; platelets 102,000.  On August 19, 2018: A complete blood count showed hemoglobin 10.4 hematocrit 34.4 MCV 98 MCH 29.7 RDW 15.4 WBC 4.9 with 65% neutrophils 25% lymphocytes 8% monocytes 1% eosinophils 1%  basophils; platelets 225,000.    At [facility] on August 29, 2018: A complete blood count showed hemoglobin 10.6 hematocrit 36.8 WBC 3.63; platelets 150,000.  Serum iron 52 TIBC 261 iron saturation 20%. Serology for hepatitis B was nonreactive.  Although he took metformin in the past, he is no longer on diabetic medication. His most recent colonoscopy was approximately 2 years ago.  Benign polyps were removed and retrieved. Although his uric acid has been elevated he denies gouty arthritis. In November, 2014 he had a right lower extremity deep vein thrombosis and pulmonary embolism.  He denies any paresthesias. he was on rivaroxaban.  He is no longer on anticoagulation. Since starting hemodialysis, he has lost nearly 40 pounds of water weight.  At the time of his initial visit on October 8, we discussed in detail his prior complex history, importance, rationale, and methodology for evaluating anemia in the setting of end-stage renal disease and chronic hemodialysis.  His red blood cell indices were normal.  The white blood cell manual differential showed a normal distribution.  A peripheral blood smear reviewed by hematopathology revealed a normochromic and normocytic anemia. A battery of laboratory studies were obtained to exclude an underlying inflammatory/infectious etiology, metabolic anomaly, nutritional deficiency, hemolytic process, or paraproteinemia.  Since his last visit, he has not started any new medications.  In the interim since his last visit, his appetite waxes and wanes.  He denies dizziness, lightheadedness, syncope, near syncopal episodes.  His energy level is fair.  He reports no new visual changes or hearing deficit.  He denies cough, sore throat, or orthopnea.  There is  no rash or itching.  He denies dyspnea at rest.  He has chronic but stable exertional dyspnea unchanged over his baseline.  No fever, shaking chills, sweats, or flulike symptoms.  He has no heartburn or  indigestion.  There is no nausea, vomiting, diarrhea or constipation.  He generally moves his bowels twice daily without difficulty. He reports no melena or bright red blood per rectum.   He is not anuric. No urinary frequency, urgency, hematuria, or dysuria are reported.  He reports no swelling of his ankles.  There is no bleeding tendency or easy bruisability.  He has no new focal areas of bone, joint, muscle pain.  The tenderness in his right foot due to an ingrown toenail is improved. He denies any numbness or tingling in the fingers or toes.    His other comorbid problems include end-stage renal disease on hemodialysis since August, 2019; allergic rhinitis; anemia of chronic kidney failure (stage IV); benign prostatic hypertrophy; chronic obstructive pulmonary disease; coronary artery disease and prior non-STEMI in August 2006; prior coronary artery bypass graft in August, 2006; deep vein thrombosis within the right lower extremity 5 years ago; chronic obstructive pulmonary disease; primary hypertension; hyperuricemia; benign prostatic hypertrophy; erectile dysfunction; dyslipidemia; active tobacco dependence; peripheral arterial disease; diabetic retinopathy; "burned-out" non-insulin requiring diabetes mellitus; and former alcohol dependence (recovered alcoholic).   Recommendation/Plan: We discussed in detail the results of his prior laboratory studies from October 8.  Although modestly low without neutropenia, his white blood cell count is increasing (3.6) compared with even 3 months ago when his total white blood cell count was 3.1.  His anemia has improved significantly since starting hemodialysis with the use of both parenteral iron and ESAs.  Those results are detailed above.  He has no thrombocytopenia.  The results of his initial laboratory evaluation revealed no evidence of a hemolytic process, nutritional deficiency, paraproteinemia, thyroid dysfunction, or metabolic anomaly contributing to his  anemia and modestly low white blood cell count.  Both the polyclonal gammopathy and elevated CRP suggest an underlying chronic inflammatory process, likely due to end-stage renal disease.  He has no symptoms or complaints to suggest active infection.  As discussed with both Tasha and his wife Kevin Castillo, generally African-Americans have a lower white blood cell count than non-African-Americans.  He is not neutropenic.  Because of his steady improvement, no additional testing was requested at this visit.  He was advised to stay up-to-date on all immunizations.  Barring any unforeseen complications, his next scheduled doctor visit with laboratory studies is on December 19.  Kevin Castillo was advised to call us in the interim should any new or untoward problems arise.  The total time spent discussing the results of his initial laboratory evaluation on October 8, their meaning and implications, with recommendations was 25 minutes. At least 50% of that time was spent in discussion, reviewing outside records, laboratory evaluation, counseling, and answering questions.  This note was dictated using voice activated technology/software.  Unfortunately, typographical errors are not uncommon, and transcription is subject to mistakes and regrettably misinterpretation.  If necessary, clarification of the above information can be discussed with me at any time.  FOLLOW UP: AS DIRECTED   cc:       Edrick Oh, MD             Oval Linsey, MD   Henreitta Leber, MD  Hematology/Oncology [facility] [address] [place], [place] [address] Office: [phone] PIRJ: [phone]

## 2018-10-11 NOTE — Telephone Encounter (Signed)
Scheduled appt per 10/24 los- gave patient aVS and calender per los.

## 2018-10-12 DIAGNOSIS — N186 End stage renal disease: Secondary | ICD-10-CM | POA: Diagnosis not present

## 2018-10-12 DIAGNOSIS — N2581 Secondary hyperparathyroidism of renal origin: Secondary | ICD-10-CM | POA: Diagnosis not present

## 2018-10-15 DIAGNOSIS — N186 End stage renal disease: Secondary | ICD-10-CM | POA: Diagnosis not present

## 2018-10-15 DIAGNOSIS — N2581 Secondary hyperparathyroidism of renal origin: Secondary | ICD-10-CM | POA: Diagnosis not present

## 2018-10-17 DIAGNOSIS — N186 End stage renal disease: Secondary | ICD-10-CM | POA: Diagnosis not present

## 2018-10-17 DIAGNOSIS — N2581 Secondary hyperparathyroidism of renal origin: Secondary | ICD-10-CM | POA: Diagnosis not present

## 2018-10-18 ENCOUNTER — Ambulatory Visit: Payer: Medicare HMO | Admitting: Family

## 2018-10-18 ENCOUNTER — Ambulatory Visit (HOSPITAL_COMMUNITY): Admission: RE | Admit: 2018-10-18 | Payer: Medicare HMO | Source: Ambulatory Visit

## 2018-10-18 DIAGNOSIS — N186 End stage renal disease: Secondary | ICD-10-CM | POA: Diagnosis not present

## 2018-10-18 DIAGNOSIS — I509 Heart failure, unspecified: Secondary | ICD-10-CM | POA: Diagnosis not present

## 2018-10-18 DIAGNOSIS — Z992 Dependence on renal dialysis: Secondary | ICD-10-CM | POA: Diagnosis not present

## 2018-10-19 ENCOUNTER — Encounter: Payer: Self-pay | Admitting: Family

## 2018-10-19 DIAGNOSIS — N2581 Secondary hyperparathyroidism of renal origin: Secondary | ICD-10-CM | POA: Diagnosis not present

## 2018-10-19 DIAGNOSIS — N186 End stage renal disease: Secondary | ICD-10-CM | POA: Diagnosis not present

## 2018-10-22 DIAGNOSIS — N186 End stage renal disease: Secondary | ICD-10-CM | POA: Diagnosis not present

## 2018-10-22 DIAGNOSIS — N2581 Secondary hyperparathyroidism of renal origin: Secondary | ICD-10-CM | POA: Diagnosis not present

## 2018-10-24 DIAGNOSIS — N186 End stage renal disease: Secondary | ICD-10-CM | POA: Diagnosis not present

## 2018-10-24 DIAGNOSIS — N2581 Secondary hyperparathyroidism of renal origin: Secondary | ICD-10-CM | POA: Diagnosis not present

## 2018-10-26 DIAGNOSIS — N186 End stage renal disease: Secondary | ICD-10-CM | POA: Diagnosis not present

## 2018-10-26 DIAGNOSIS — N2581 Secondary hyperparathyroidism of renal origin: Secondary | ICD-10-CM | POA: Diagnosis not present

## 2018-10-29 DIAGNOSIS — N2581 Secondary hyperparathyroidism of renal origin: Secondary | ICD-10-CM | POA: Diagnosis not present

## 2018-10-29 DIAGNOSIS — N186 End stage renal disease: Secondary | ICD-10-CM | POA: Diagnosis not present

## 2018-10-30 DIAGNOSIS — N186 End stage renal disease: Secondary | ICD-10-CM | POA: Diagnosis not present

## 2018-10-30 DIAGNOSIS — F1010 Alcohol abuse, uncomplicated: Secondary | ICD-10-CM | POA: Diagnosis not present

## 2018-10-30 DIAGNOSIS — I120 Hypertensive chronic kidney disease with stage 5 chronic kidney disease or end stage renal disease: Secondary | ICD-10-CM | POA: Diagnosis not present

## 2018-10-30 DIAGNOSIS — E1122 Type 2 diabetes mellitus with diabetic chronic kidney disease: Secondary | ICD-10-CM | POA: Diagnosis not present

## 2018-10-30 DIAGNOSIS — I739 Peripheral vascular disease, unspecified: Secondary | ICD-10-CM | POA: Diagnosis not present

## 2018-10-30 DIAGNOSIS — Z951 Presence of aortocoronary bypass graft: Secondary | ICD-10-CM | POA: Diagnosis not present

## 2018-10-30 DIAGNOSIS — R0602 Shortness of breath: Secondary | ICD-10-CM | POA: Diagnosis not present

## 2018-10-30 DIAGNOSIS — Z01818 Encounter for other preprocedural examination: Secondary | ICD-10-CM | POA: Diagnosis not present

## 2018-10-30 DIAGNOSIS — F17200 Nicotine dependence, unspecified, uncomplicated: Secondary | ICD-10-CM | POA: Diagnosis not present

## 2018-10-30 DIAGNOSIS — Z7189 Other specified counseling: Secondary | ICD-10-CM | POA: Diagnosis not present

## 2018-10-31 DIAGNOSIS — N2581 Secondary hyperparathyroidism of renal origin: Secondary | ICD-10-CM | POA: Diagnosis not present

## 2018-10-31 DIAGNOSIS — N186 End stage renal disease: Secondary | ICD-10-CM | POA: Diagnosis not present

## 2018-11-02 DIAGNOSIS — N2581 Secondary hyperparathyroidism of renal origin: Secondary | ICD-10-CM | POA: Diagnosis not present

## 2018-11-02 DIAGNOSIS — N186 End stage renal disease: Secondary | ICD-10-CM | POA: Diagnosis not present

## 2018-11-05 DIAGNOSIS — N186 End stage renal disease: Secondary | ICD-10-CM | POA: Diagnosis not present

## 2018-11-05 DIAGNOSIS — N2581 Secondary hyperparathyroidism of renal origin: Secondary | ICD-10-CM | POA: Diagnosis not present

## 2018-11-07 DIAGNOSIS — N186 End stage renal disease: Secondary | ICD-10-CM | POA: Diagnosis not present

## 2018-11-07 DIAGNOSIS — N2581 Secondary hyperparathyroidism of renal origin: Secondary | ICD-10-CM | POA: Diagnosis not present

## 2018-11-09 DIAGNOSIS — N2581 Secondary hyperparathyroidism of renal origin: Secondary | ICD-10-CM | POA: Diagnosis not present

## 2018-11-09 DIAGNOSIS — N186 End stage renal disease: Secondary | ICD-10-CM | POA: Diagnosis not present

## 2018-11-11 DIAGNOSIS — N186 End stage renal disease: Secondary | ICD-10-CM | POA: Diagnosis not present

## 2018-11-11 DIAGNOSIS — N2581 Secondary hyperparathyroidism of renal origin: Secondary | ICD-10-CM | POA: Diagnosis not present

## 2018-11-12 ENCOUNTER — Encounter: Payer: Self-pay | Admitting: Family

## 2018-11-12 ENCOUNTER — Encounter (HOSPITAL_COMMUNITY): Payer: Medicare HMO

## 2018-11-12 ENCOUNTER — Ambulatory Visit (INDEPENDENT_AMBULATORY_CARE_PROVIDER_SITE_OTHER): Payer: Medicare HMO | Admitting: Family

## 2018-11-12 ENCOUNTER — Other Ambulatory Visit: Payer: Self-pay

## 2018-11-12 ENCOUNTER — Ambulatory Visit: Payer: Medicare HMO | Admitting: Family

## 2018-11-12 ENCOUNTER — Ambulatory Visit (HOSPITAL_COMMUNITY)
Admission: RE | Admit: 2018-11-12 | Discharge: 2018-11-12 | Disposition: A | Discharge status: Home / Self Care | Payer: Medicare HMO | Source: Ambulatory Visit | Attending: Internal Medicine | Admitting: Internal Medicine

## 2018-11-12 VITALS — BP 95/53 | HR 73 | Temp 99.7°F | Resp 16 | Ht 67.0 in | Wt 167.0 lb

## 2018-11-12 DIAGNOSIS — N186 End stage renal disease: Secondary | ICD-10-CM

## 2018-11-12 DIAGNOSIS — I739 Peripheral vascular disease, unspecified: Secondary | ICD-10-CM | POA: Insufficient documentation

## 2018-11-12 DIAGNOSIS — F17200 Nicotine dependence, unspecified, uncomplicated: Secondary | ICD-10-CM | POA: Diagnosis not present

## 2018-11-12 DIAGNOSIS — Z992 Dependence on renal dialysis: Secondary | ICD-10-CM | POA: Diagnosis not present

## 2018-11-12 DIAGNOSIS — I998 Other disorder of circulatory system: Secondary | ICD-10-CM | POA: Diagnosis not present

## 2018-11-12 DIAGNOSIS — I70229 Atherosclerosis of native arteries of extremities with rest pain, unspecified extremity: Secondary | ICD-10-CM

## 2018-11-12 NOTE — Patient Instructions (Addendum)
Steps to Quit Smoking Smoking tobacco can be bad for your health. It can also affect almost every organ in your body. Smoking puts you and people around you at risk for many serious long-lasting (chronic) diseases. Quitting smoking is hard, but it is one of the best things that you can do for your health. It is never too late to quit. What are the benefits of quitting smoking? When you quit smoking, you lower your risk for getting serious diseases and conditions. They can include:  Lung cancer or lung disease.  Heart disease.  Stroke.  Heart attack.  Not being able to have children (infertility).  Weak bones (osteoporosis) and broken bones (fractures).  If you have coughing, wheezing, and shortness of breath, those symptoms may get better when you quit. You may also get sick less often. If you are pregnant, quitting smoking can help to lower your chances of having a baby of low birth weight. What can I do to help me quit smoking? Talk with your doctor about what can help you quit smoking. Some things you can do (strategies) include:  Quitting smoking totally, instead of slowly cutting back how much you smoke over a period of time.  Going to in-person counseling. You are more likely to quit if you go to many counseling sessions.  Using resources and support systems, such as: ? Online chats with a counselor. ? Phone quitlines. ? Printed self-help materials. ? Support groups or group counseling. ? Text messaging programs. ? Mobile phone apps or applications.  Taking medicines. Some of these medicines may have nicotine in them. If you are pregnant or breastfeeding, do not take any medicines to quit smoking unless your doctor says it is okay. Talk with your doctor about counseling or other things that can help you.  Talk with your doctor about using more than one strategy at the same time, such as taking medicines while you are also going to in-person counseling. This can help make  quitting easier. What things can I do to make it easier to quit? Quitting smoking might feel very hard at first, but there is a lot that you can do to make it easier. Take these steps:  Talk to your family and friends. Ask them to support and encourage you.  Call phone quitlines, reach out to support groups, or work with a counselor.  Ask people who smoke to not smoke around you.  Avoid places that make you want (trigger) to smoke, such as: ? Bars. ? Parties. ? Smoke-break areas at work.  Spend time with people who do not smoke.  Lower the stress in your life. Stress can make you want to smoke. Try these things to help your stress: ? Getting regular exercise. ? Deep-breathing exercises. ? Yoga. ? Meditating. ? Doing a body scan. To do this, close your eyes, focus on one area of your body at a time from head to toe, and notice which parts of your body are tense. Try to relax the muscles in those areas.  Download or buy apps on your mobile phone or tablet that can help you stick to your quit plan. There are many free apps, such as QuitGuide from the CDC (Centers for Disease Control and Prevention). You can find more support from smokefree.gov and other websites.  This information is not intended to replace advice given to you by your health care provider. Make sure you discuss any questions you have with your health care provider. Document Released: 10/01/2009 Document   Revised: 08/02/2016 Document Reviewed: 04/21/2015 Elsevier Interactive Patient Education  2018 Elsevier Inc.     Peripheral Vascular Disease Peripheral vascular disease (PVD) is a disease of the blood vessels that are not part of your heart and brain. A simple term for PVD is poor circulation. In most cases, PVD narrows the blood vessels that carry blood from your heart to the rest of your body. This can result in a decreased supply of blood to your arms, legs, and internal organs, like your stomach or kidneys.  However, it most often affects a person's lower legs and feet. There are two types of PVD.  Organic PVD. This is the more common type. It is caused by damage to the structure of blood vessels.  Functional PVD. This is caused by conditions that make blood vessels contract and tighten (spasm).  Without treatment, PVD tends to get worse over time. PVD can also lead to acute ischemic limb. This is when an arm or limb suddenly has trouble getting enough blood. This is a medical emergency. Follow these instructions at home:  Take medicines only as told by your doctor.  Do not use any tobacco products, including cigarettes, chewing tobacco, or electronic cigarettes. If you need help quitting, ask your doctor.  Lose weight if you are overweight, and maintain a healthy weight as told by your doctor.  Eat a diet that is low in fat and cholesterol. If you need help, ask your doctor.  Exercise regularly. Ask your doctor for some good activities for you.  Take good care of your feet. ? Wear comfortable shoes that fit well. ? Check your feet often for any cuts or sores. Contact a doctor if:  You have cramps in your legs while walking.  You have leg pain when you are at rest.  You have coldness in a leg or foot.  Your skin changes.  You are unable to get or have an erection (erectile dysfunction).  You have cuts or sores on your feet that are not healing. Get help right away if:  Your arm or leg turns cold and blue.  Your arms or legs become red, warm, swollen, painful, or numb.  You have chest pain or trouble breathing.  You suddenly have weakness in your face, arm, or leg.  You become very confused or you cannot speak.  You suddenly have a very bad headache.  You suddenly cannot see. This information is not intended to replace advice given to you by your health care provider. Make sure you discuss any questions you have with your health care provider. Document Released:  03/01/2010 Document Revised: 05/12/2016 Document Reviewed: 05/15/2014 Elsevier Interactive Patient Education  2017 Elsevier Inc.  

## 2018-11-12 NOTE — Progress Notes (Signed)
[facility]   CC: Follow up peripheral artery occlusive disease  History of Present Illness Kevin Bringhurst Sr. is a 72 y.o. male who is s/p Right brachiocephalic arteriovenous fistulaand TDC placement on 07/23/18 by Dr. Bridgett Larsson.  He was seen in our office on 09/04/2018 to check maturity of the fistula.  On duplex evaluation the diameter ranges from 0.44cm-0.55cm and depth 0.21cm-0.84cm.   Pt was last evaluated on 10-02-18 by [facility] [employer]. At that time the fistula had a good thrill and was very visible over the bicep area of his right UE.  The diameter had improved since his previous visit.  He was on HD via right IJ TDC without problems.  The right UE fistula may be accessed in 4 weeks 10/23/2018.  If there are any problems he will call our office other sie f/u PRN.  Pt returns today, referred by Dr. Barkley Bruns, podiatrist, for evaluation of right lower extremity perfusion.   9 pages of notes from [facility], reviewed.  It appears that on 09-27-18 Dr. Barkley Bruns debrided ulcer between right 4th and 5th toes.   Diabetic: Yes, pt states currently well controlled Tobacco use: smoker  (currently 5-6 cigs/day, started in his teens)  Pt meds include: Statin :Yes Betablocker: Yes ASA: No Other anticoagulants/antiplatelets: no  Past Medical History:  Diagnosis Date  . Acute left systolic heart failure ([facility] [place]) 01/06/2016  . Adenoma of left adrenal gland 06/16/2014   CT Abdomen (06/04/2014): Incidental, 20 mm, 8.33 HU suggesting benign adenoma   . Alcohol abuse 02/15/2007   Reports consuming 1 pint of gin on weekends, does not drink every day. Denies history of seizures, blackouts, or tremors.    . Allergic rhinitis 04/09/2014  . Anemia 11/12/2013   Unclear cause as of yet (? EtOH abuse)   . Anemia of chronic kidney failure, stage 4 (severe) ([place]) 07/28/2017  . Benign prostatic hypertrophy 06/02/2014  . Chronic kidney disease, stage 3 ([facility]) 11/12/2013  .  Chronic systolic heart failure ([place]) 01/15/2016   Echo (01/07/2016): LVEF 35%  . COPD (chronic obstructive pulmonary disease) ([place])   . Coronary artery disease 02/15/2007   Non-STEMI 07/2005, s/p CABG x 4 (08/01/2005): LIMA to LAD, SVG to OM, RCA, PCA   . DVT (deep venous thrombosis) ([place]) 2006   RLE "when I had OHS"  . Erectile dysfunction associated with type 2 diabetes mellitus ([facility]) 02/15/2007  . Essential hypertension 02/15/2007  . Hyperlipidemia 02/15/2007  . Major depression, single episode 11/02/2015  . Obesity (BMI 30.0-34.9) 02/15/2007  . Peripheral vascular occlusive disease ([place]) 12/30/2013   ABI (01/03/2014): Right 0.64, Left 0.64   . Pneumonia X 1  . Secondary hyperparathyroidism of renal origin ([facility]) 07/28/2017  . Sigmoid diverticulosis 01/15/2016   Minimal per colonoscopy 01/11/2016  . Tobacco abuse 02/15/2007  . Tubular adenoma of colon 01/11/2016   Two 6 mm semi-pedunculated tubular adenomas excised endoscopically 01/11/2016.  Repeat colonoscopy due 12/2020.  . 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 Type 2 diabetes mellitus with stage 3 chronic kidney disease ([place]) 02/15/2007  . Venous thromboembolism 11/11/2013   LE Dopplers (01/2011): RLE DVT. CTA with mild subacute to chronic right-sided pulmonary emboli.  Patient has elected to continue anticoagulation.     Social History Social History   Tobacco Use  . Smoking status: Current Every Day Smoker    Packs/day: 0.50    Years: 60.00    Pack  years: 30.00    Types: Cigarettes  . Smokeless tobacco: Former Systems developer    Types: Snuff, Chew  Substance Use Topics  . Alcohol use: Yes    Alcohol/week: 5.0 standard drinks    Types: 5 Shots of liquor per week    Comment: 07/18/2018 Drinks 1/2 pint of gin over the weekend but does not drink everyday. Denies hx of blackouts, seizures, tremors.  . Drug use: No    Family History Family History  Problem Relation  Age of Onset  . Heart disease Father   . COPD Father   . Diabetes Mellitus II Mother   . Healthy Sister   . Healthy Brother   . Healthy Daughter   . Healthy Son   . Healthy Sister   . Healthy Sister   . Healthy Sister   . Healthy Sister   . Healthy Daughter   . Healthy Son   . Healthy Son   . Healthy Son   . Healthy Son   . Healthy Son   . Healthy Son     Past Surgical History:  Procedure Laterality Date  . AV FISTULA PLACEMENT  07/23/2018   Procedure: RIGHT UPPER ARM ARTERIOVENOUS (AV) BRACHIOCEPHALIC FISTULA CREATION;  Surgeon: Conrad Autryville, MD;  Location: [facility];  Service: Vascular;;  . CARDIAC CATHETERIZATION  07/2005  . CATARACT EXTRACTION W/ INTRAOCULAR LENS  IMPLANT, BILATERAL Bilateral 04/2014   Left 04/30/2014, Right 05/07/2014  . COLONOSCOPY WITH PROPOFOL N/A 01/11/2016   Procedure: COLONOSCOPY WITH PROPOFOL;  Surgeon: Ronald Lobo, MD;  Location: [facility];  Service: Endoscopy;  Laterality: N/A;  Needs MAC.  Marland Kitchen CORONARY ARTERY BYPASS GRAFT  08/01/2005   x 4, LIMA to LAD, SVG to OM, RCA, PCA  . ESOPHAGOGASTRODUODENOSCOPY N/A 01/08/2016   Procedure: ESOPHAGOGASTRODUODENOSCOPY (EGD);  Surgeon: Wilford Corner, MD;  Location: [facility] ENDOSCOPY;  Service: Endoscopy;  Laterality: N/A;  . GIVENS CAPSULE STUDY N/A 01/12/2016   Procedure: GIVENS CAPSULE STUDY;  Surgeon: Ronald Lobo, MD;  Location: [facility];  Service: Endoscopy;  Laterality: N/A;  . INSERTION OF DIALYSIS CATHETER Right 07/23/2018   Procedure: INSERTION OF  DIALYSIS CATHETER - RIGHT INTERNAL JUGULAR PLACEMENT;  Surgeon: Conrad Atalissa, MD;  Location: [facility];  Service: Vascular;  Laterality: Right;    Allergies  Allergen Reactions  . Nicotine Transdermal System [Nicotine] Other (See Comments)    Vivid dreams    Current Outpatient Medications  Medication Sig Dispense Refill  . allopurinol (ZYLOPRIM) 100 MG tablet Take 1 tablet (100 mg total) by mouth daily. 30 tablet 3  . atorvastatin (LIPITOR) 20 MG tablet  Take 1 tablet (20 mg total) by mouth daily. 90 tablet 3  . B Complex-C-Zn-Folic Acid (DIALYVITE/ZINC) TABS Take 1 tablet by mouth daily.  2  . Darbepoetin Alfa (ARANESP) 100 MCG/0.5ML SOSY injection Inject 0.5 mLs (100 mcg total) into the vein every Thursday with hemodialysis.    . ferric gluconate 250 mg in sodium chloride 0.9 % 100 mL Inject 250 mg into the vein every Monday, Wednesday, and Friday with hemodialysis.    . finasteride (PROSCAR) 5 MG tablet Take 1 tablet (5 mg total) by mouth daily. 30 tablet 11  . fluticasone (FLONASE) 50 MCG/ACT nasal spray Place 1 spray into both nostrils as needed for allergies or rhinitis.    . furosemide (LASIX) 80 MG tablet     . heparin 1000 unit/mL SOLN injection 1 mL (1,000 Units total) by Dialysis route as needed (in dialysis).    . labetalol (  NORMODYNE) 200 MG tablet Take 200 mg by mouth 2 (two) times daily.    Marland Kitchen lidocaine-prilocaine (EMLA) cream Apply 1 application topically as needed (topical anesthesia for hemodialysis if Gebauers and Lidocaine injection are ineffective.).    Marland Kitchen pentafluoroprop-tetrafluoroeth (GEBAUERS) AERO Apply 1 application topically as needed (topical anesthesia for hemodialysis).  0  . sodium chloride 0.9 % infusion Inject 250 mLs into the vein as needed (for IV line care(Saline / Heparin Lock)).  0  . sodium chloride 0.9 % infusion Inject 100 mLs into the vein as needed (symptomatic hypotension or decrease in SBP < 90 mmHg).  0  . sodium chloride 0.9 % infusion Inject 100 mLs into the vein as needed (severe cramping).  0   No current facility-administered medications for this visit.     ROS: See HPI for pertinent positives and negatives.   Physical Examination  Vitals:   11/12/18 1506  BP: (!) 95/53  Pulse: 73  Resp: 16  Temp: 99.7 F (37.6 C)  TempSrc: Oral  SpO2: 96%  Weight: 167 lb (75.8 kg)  Height: 5' 7"  (1.702 m)   Body mass index is 26.16 kg/m.  General: A&O x 3, WDWN male. Gait: normal HENT: No  gross abnormalities.  Eyes: PERRLA. Pulmonary: Respirations are non labored, CTAB, good air movement in all fields Cardiac: regular rhythm, no detected murmur.      Radial pulses are 1+ palpable bilaterally   Adominal aortic pulse is not palpable                         VASCULAR EXAM: Extremities without ischemic changes, without Gangrene; without open wounds.  Right foot interspace between 4th and 5th toes, no ulcer     Right foot interspace between 4th and 5th toes, no ulcer      Right foot                                                                                                           LE Pulses Right Left       FEMORAL  3+ palpable  2+ palpable        POPLITEAL not palpable   not palpable       POSTERIOR TIBIAL  not palpable   not palpable        DORSALIS PEDIS      ANTERIOR TIBIAL not palpable  not palpable    Abdomen: soft, NT, no palpable masses. Skin: no rashes, no cellulitis, no ulcers noted. Musculoskeletal: no muscle wasting or atrophy.  Neurologic: A&O X 3; appropriate affect, Sensation is normal; MOTOR FUNCTION:  moving all extremities equally, motor strength 5/5 throughout. Speech is fluent/normal. CN 2-12 intact. Psychiatric: Thought content is normal, mood appropriate for clinical situation.     ASSESSMENT: Alim Cattell Sr. is a 72 y.o. male who is s/p Right brachiocephalic arteriovenous fistulaand TDC placement on 07/23/18 by Dr. Bridgett Larsson.  Pt states access was attempted of his AV fistula, but that the AV fistula was punctured, and the [facility] is  still being used for HD.   He has never had a procedure to address arterial perfusion in his lower extremities.   He was referred by his podiatrist for evaluation of arterial perfusion of lower extremities, with hx of ulcer between his right 4th and 5th toes.  If there was an ulcer between his right 4th and 5th toes, it is no longer there, no ulcers or wounds. He denies any pain in his right foot except  some nights his right great toe has pain and wakes him up, but not most nights. He has known gout.   I discussed with Dr. Trula Slade pt HPI, physical exam results, and ABI results from today. If arteriogram attempted now on this pt with no open wound, little pain in his right foot, his perfusion could possibly become more compromised; he has ESRD and therefore has tibial disease.   I advised pt to protect his feet at all times from any injury.  Daily seated leg exercises in addition to daily walking. His right calf claudicates after walking a block, relieved by 15-20 minutes rest.   His atherosclerotic risk factors include active smoking since his teens, ESRD on HD, CHF, CAD, and COPD.   DATA  ABI (Date: 11/12/2018):  R:   ABI: 0 ([facility] on 02-15-17),   PT: absent  DP: absent  TBI:  0, toe pressure 0, (was absent)  L:   ABI: 0.73 (was 0.65),   PT: dampened monophasic  DP: absent  TBI: 0, toe pressure 0, (was 0.22) Absent right ABI and TBI. Left ABI with monophasic 0.73    PLAN:  Over 3 minutes was spent counseling patient re smoking cessation, and patient was given several free resources re smoking cessation.  Based on the patient's vascular studies and examination, pt will return to clinic in 3 months with ABI's.  I advised him to notify us if he develops sores that do not heal in his feet or legs, or worse pain in his legs with walking.   I discussed in depth with the patient the nature of atherosclerosis, and emphasized the importance of maximal medical management including strict control of blood pressure, blood glucose, and lipid levels, obtaining regular exercise, and cessation of smoking.  The patient is aware that without maximal medical management the underlying atherosclerotic disease process will progress, limiting the benefit of any interventions.  The patient was given information about PAD including signs, symptoms, treatment, what symptoms should prompt the  patient to seek immediate medical care, and risk reduction measures to take.  Kevin Chambers, RN, MSN, FNP-C [facility] Phone: [phone]  Clinic MD: Trula Slade  11/12/18 3:58 PM

## 2018-11-13 DIAGNOSIS — N2581 Secondary hyperparathyroidism of renal origin: Secondary | ICD-10-CM | POA: Diagnosis not present

## 2018-11-13 DIAGNOSIS — N186 End stage renal disease: Secondary | ICD-10-CM | POA: Diagnosis not present

## 2018-11-16 DIAGNOSIS — N186 End stage renal disease: Secondary | ICD-10-CM | POA: Diagnosis not present

## 2018-11-16 DIAGNOSIS — N2581 Secondary hyperparathyroidism of renal origin: Secondary | ICD-10-CM | POA: Diagnosis not present

## 2018-11-17 DIAGNOSIS — N186 End stage renal disease: Secondary | ICD-10-CM | POA: Diagnosis not present

## 2018-11-17 DIAGNOSIS — I509 Heart failure, unspecified: Secondary | ICD-10-CM | POA: Diagnosis not present

## 2018-11-17 DIAGNOSIS — Z992 Dependence on renal dialysis: Secondary | ICD-10-CM | POA: Diagnosis not present

## 2018-11-19 DIAGNOSIS — N2581 Secondary hyperparathyroidism of renal origin: Secondary | ICD-10-CM | POA: Diagnosis not present

## 2018-11-19 DIAGNOSIS — N186 End stage renal disease: Secondary | ICD-10-CM | POA: Diagnosis not present

## 2018-11-21 DIAGNOSIS — N186 End stage renal disease: Secondary | ICD-10-CM | POA: Diagnosis not present

## 2018-11-21 DIAGNOSIS — N2581 Secondary hyperparathyroidism of renal origin: Secondary | ICD-10-CM | POA: Diagnosis not present

## 2018-11-23 DIAGNOSIS — N186 End stage renal disease: Secondary | ICD-10-CM | POA: Diagnosis not present

## 2018-11-23 DIAGNOSIS — N2581 Secondary hyperparathyroidism of renal origin: Secondary | ICD-10-CM | POA: Diagnosis not present

## 2018-11-26 DIAGNOSIS — N186 End stage renal disease: Secondary | ICD-10-CM | POA: Diagnosis not present

## 2018-11-26 DIAGNOSIS — N2581 Secondary hyperparathyroidism of renal origin: Secondary | ICD-10-CM | POA: Diagnosis not present

## 2018-11-28 DIAGNOSIS — N186 End stage renal disease: Secondary | ICD-10-CM | POA: Diagnosis not present

## 2018-11-28 DIAGNOSIS — N2581 Secondary hyperparathyroidism of renal origin: Secondary | ICD-10-CM | POA: Diagnosis not present

## 2018-11-30 DIAGNOSIS — N186 End stage renal disease: Secondary | ICD-10-CM | POA: Diagnosis not present

## 2018-11-30 DIAGNOSIS — N2581 Secondary hyperparathyroidism of renal origin: Secondary | ICD-10-CM | POA: Diagnosis not present

## 2018-12-03 ENCOUNTER — Telehealth: Payer: Self-pay | Admitting: Internal Medicine

## 2018-12-03 DIAGNOSIS — N2581 Secondary hyperparathyroidism of renal origin: Secondary | ICD-10-CM | POA: Diagnosis not present

## 2018-12-03 DIAGNOSIS — N186 End stage renal disease: Secondary | ICD-10-CM | POA: Diagnosis not present

## 2018-12-03 NOTE — Telephone Encounter (Signed)
Former [facility] patient. Moved 12/31 appointments with [facility] to 12/26 with [facility]. Spoke with patient.

## 2018-12-04 DIAGNOSIS — Z992 Dependence on renal dialysis: Secondary | ICD-10-CM | POA: Diagnosis not present

## 2018-12-04 DIAGNOSIS — N186 End stage renal disease: Secondary | ICD-10-CM | POA: Diagnosis not present

## 2018-12-04 DIAGNOSIS — T82858A Stenosis of vascular prosthetic devices, implants and grafts, initial encounter: Secondary | ICD-10-CM | POA: Diagnosis not present

## 2018-12-04 DIAGNOSIS — I871 Compression of vein: Secondary | ICD-10-CM | POA: Diagnosis not present

## 2018-12-05 DIAGNOSIS — N186 End stage renal disease: Secondary | ICD-10-CM | POA: Diagnosis not present

## 2018-12-05 DIAGNOSIS — N2581 Secondary hyperparathyroidism of renal origin: Secondary | ICD-10-CM | POA: Diagnosis not present

## 2018-12-06 ENCOUNTER — Other Ambulatory Visit: Payer: Medicare HMO

## 2018-12-06 ENCOUNTER — Ambulatory Visit: Payer: Medicare HMO | Admitting: Hematology and Oncology

## 2018-12-07 DIAGNOSIS — N2581 Secondary hyperparathyroidism of renal origin: Secondary | ICD-10-CM | POA: Diagnosis not present

## 2018-12-07 DIAGNOSIS — N186 End stage renal disease: Secondary | ICD-10-CM | POA: Diagnosis not present

## 2018-12-09 DIAGNOSIS — N2581 Secondary hyperparathyroidism of renal origin: Secondary | ICD-10-CM | POA: Diagnosis not present

## 2018-12-09 DIAGNOSIS — N186 End stage renal disease: Secondary | ICD-10-CM | POA: Diagnosis not present

## 2018-12-11 DIAGNOSIS — N186 End stage renal disease: Secondary | ICD-10-CM | POA: Diagnosis not present

## 2018-12-11 DIAGNOSIS — N2581 Secondary hyperparathyroidism of renal origin: Secondary | ICD-10-CM | POA: Diagnosis not present

## 2018-12-13 ENCOUNTER — Inpatient Hospital Stay: Payer: Medicare HMO | Attending: Hematology and Oncology

## 2018-12-13 ENCOUNTER — Inpatient Hospital Stay: Payer: Medicare HMO | Admitting: Internal Medicine

## 2018-12-13 DIAGNOSIS — T82898A Other specified complication of vascular prosthetic devices, implants and grafts, initial encounter: Secondary | ICD-10-CM | POA: Diagnosis not present

## 2018-12-13 DIAGNOSIS — I871 Compression of vein: Secondary | ICD-10-CM | POA: Diagnosis not present

## 2018-12-13 DIAGNOSIS — Z992 Dependence on renal dialysis: Secondary | ICD-10-CM | POA: Diagnosis not present

## 2018-12-13 DIAGNOSIS — N186 End stage renal disease: Secondary | ICD-10-CM | POA: Diagnosis not present

## 2018-12-14 DIAGNOSIS — N186 End stage renal disease: Secondary | ICD-10-CM | POA: Diagnosis not present

## 2018-12-14 DIAGNOSIS — N2581 Secondary hyperparathyroidism of renal origin: Secondary | ICD-10-CM | POA: Diagnosis not present

## 2018-12-16 DIAGNOSIS — N186 End stage renal disease: Secondary | ICD-10-CM | POA: Diagnosis not present

## 2018-12-16 DIAGNOSIS — N2581 Secondary hyperparathyroidism of renal origin: Secondary | ICD-10-CM | POA: Diagnosis not present

## 2018-12-18 ENCOUNTER — Other Ambulatory Visit: Payer: Medicare HMO

## 2018-12-18 ENCOUNTER — Ambulatory Visit: Payer: Medicare HMO | Admitting: Hematology

## 2018-12-18 DIAGNOSIS — I509 Heart failure, unspecified: Secondary | ICD-10-CM | POA: Diagnosis not present

## 2018-12-18 DIAGNOSIS — N186 End stage renal disease: Secondary | ICD-10-CM | POA: Diagnosis not present

## 2018-12-18 DIAGNOSIS — N2581 Secondary hyperparathyroidism of renal origin: Secondary | ICD-10-CM | POA: Diagnosis not present

## 2018-12-18 DIAGNOSIS — Z992 Dependence on renal dialysis: Secondary | ICD-10-CM | POA: Diagnosis not present

## 2018-12-21 DIAGNOSIS — N2581 Secondary hyperparathyroidism of renal origin: Secondary | ICD-10-CM | POA: Diagnosis not present

## 2018-12-21 DIAGNOSIS — N186 End stage renal disease: Secondary | ICD-10-CM | POA: Diagnosis not present

## 2018-12-24 DIAGNOSIS — N186 End stage renal disease: Secondary | ICD-10-CM | POA: Diagnosis not present

## 2018-12-24 DIAGNOSIS — N2581 Secondary hyperparathyroidism of renal origin: Secondary | ICD-10-CM | POA: Diagnosis not present

## 2018-12-26 DIAGNOSIS — N2581 Secondary hyperparathyroidism of renal origin: Secondary | ICD-10-CM | POA: Diagnosis not present

## 2018-12-26 DIAGNOSIS — N186 End stage renal disease: Secondary | ICD-10-CM | POA: Diagnosis not present

## 2018-12-28 ENCOUNTER — Other Ambulatory Visit: Payer: Self-pay | Admitting: Vascular Surgery

## 2018-12-28 DIAGNOSIS — N186 End stage renal disease: Secondary | ICD-10-CM | POA: Diagnosis not present

## 2018-12-28 DIAGNOSIS — N2581 Secondary hyperparathyroidism of renal origin: Secondary | ICD-10-CM | POA: Diagnosis not present

## 2018-12-28 DIAGNOSIS — I739 Peripheral vascular disease, unspecified: Secondary | ICD-10-CM

## 2018-12-31 DIAGNOSIS — N2581 Secondary hyperparathyroidism of renal origin: Secondary | ICD-10-CM | POA: Diagnosis not present

## 2018-12-31 DIAGNOSIS — N186 End stage renal disease: Secondary | ICD-10-CM | POA: Diagnosis not present

## 2019-01-02 DIAGNOSIS — N2581 Secondary hyperparathyroidism of renal origin: Secondary | ICD-10-CM | POA: Diagnosis not present

## 2019-01-02 DIAGNOSIS — N186 End stage renal disease: Secondary | ICD-10-CM | POA: Diagnosis not present

## 2019-01-03 DIAGNOSIS — I739 Peripheral vascular disease, unspecified: Secondary | ICD-10-CM | POA: Diagnosis not present

## 2019-01-04 DIAGNOSIS — N186 End stage renal disease: Secondary | ICD-10-CM | POA: Diagnosis not present

## 2019-01-04 DIAGNOSIS — N2581 Secondary hyperparathyroidism of renal origin: Secondary | ICD-10-CM | POA: Diagnosis not present

## 2019-01-07 DIAGNOSIS — N2581 Secondary hyperparathyroidism of renal origin: Secondary | ICD-10-CM | POA: Diagnosis not present

## 2019-01-07 DIAGNOSIS — N186 End stage renal disease: Secondary | ICD-10-CM | POA: Diagnosis not present

## 2019-01-09 DIAGNOSIS — N2581 Secondary hyperparathyroidism of renal origin: Secondary | ICD-10-CM | POA: Diagnosis not present

## 2019-01-09 DIAGNOSIS — N186 End stage renal disease: Secondary | ICD-10-CM | POA: Diagnosis not present

## 2019-01-11 DIAGNOSIS — N186 End stage renal disease: Secondary | ICD-10-CM | POA: Diagnosis not present

## 2019-01-11 DIAGNOSIS — N2581 Secondary hyperparathyroidism of renal origin: Secondary | ICD-10-CM | POA: Diagnosis not present

## 2019-01-14 DIAGNOSIS — N2581 Secondary hyperparathyroidism of renal origin: Secondary | ICD-10-CM | POA: Diagnosis not present

## 2019-01-14 DIAGNOSIS — N186 End stage renal disease: Secondary | ICD-10-CM | POA: Diagnosis not present

## 2019-01-16 DIAGNOSIS — N186 End stage renal disease: Secondary | ICD-10-CM | POA: Diagnosis not present

## 2019-01-16 DIAGNOSIS — N2581 Secondary hyperparathyroidism of renal origin: Secondary | ICD-10-CM | POA: Diagnosis not present

## 2019-01-18 DIAGNOSIS — I509 Heart failure, unspecified: Secondary | ICD-10-CM | POA: Diagnosis not present

## 2019-01-18 DIAGNOSIS — N2581 Secondary hyperparathyroidism of renal origin: Secondary | ICD-10-CM | POA: Diagnosis not present

## 2019-01-18 DIAGNOSIS — Z992 Dependence on renal dialysis: Secondary | ICD-10-CM | POA: Diagnosis not present

## 2019-01-18 DIAGNOSIS — N186 End stage renal disease: Secondary | ICD-10-CM | POA: Diagnosis not present

## 2019-01-19 ENCOUNTER — Emergency Department (HOSPITAL_COMMUNITY): Payer: Medicare HMO

## 2019-01-19 ENCOUNTER — Encounter (HOSPITAL_COMMUNITY): Payer: Self-pay

## 2019-01-19 ENCOUNTER — Emergency Department (HOSPITAL_COMMUNITY)
Admission: EM | Admit: 2019-01-19 | Discharge: 2019-01-19 | Disposition: A | Discharge status: Home / Self Care | Payer: Medicare HMO | Attending: Emergency Medicine | Admitting: Emergency Medicine

## 2019-01-19 ENCOUNTER — Other Ambulatory Visit: Payer: Self-pay

## 2019-01-19 DIAGNOSIS — M79671 Pain in right foot: Secondary | ICD-10-CM | POA: Insufficient documentation

## 2019-01-19 DIAGNOSIS — F17210 Nicotine dependence, cigarettes, uncomplicated: Secondary | ICD-10-CM | POA: Diagnosis not present

## 2019-01-19 DIAGNOSIS — J449 Chronic obstructive pulmonary disease, unspecified: Secondary | ICD-10-CM | POA: Diagnosis not present

## 2019-01-19 DIAGNOSIS — K4090 Unilateral inguinal hernia, without obstruction or gangrene, not specified as recurrent: Secondary | ICD-10-CM | POA: Insufficient documentation

## 2019-01-19 DIAGNOSIS — Z79899 Other long term (current) drug therapy: Secondary | ICD-10-CM | POA: Diagnosis not present

## 2019-01-19 DIAGNOSIS — I5023 Acute on chronic systolic (congestive) heart failure: Secondary | ICD-10-CM | POA: Insufficient documentation

## 2019-01-19 DIAGNOSIS — N185 Chronic kidney disease, stage 5: Secondary | ICD-10-CM | POA: Insufficient documentation

## 2019-01-19 DIAGNOSIS — E119 Type 2 diabetes mellitus without complications: Secondary | ICD-10-CM | POA: Insufficient documentation

## 2019-01-19 DIAGNOSIS — I132 Hypertensive heart and chronic kidney disease with heart failure and with stage 5 chronic kidney disease, or end stage renal disease: Secondary | ICD-10-CM | POA: Insufficient documentation

## 2019-01-19 LAB — CBC WITH DIFFERENTIAL/PLATELET
Abs Immature Granulocytes: 0.01 10*3/uL (ref 0.00–0.07)
BASOS PCT: 1 %
Basophils Absolute: 0 10*3/uL (ref 0.0–0.1)
Eosinophils Absolute: 0 10*3/uL (ref 0.0–0.5)
Eosinophils Relative: 1 %
HCT: 33.7 % — ABNORMAL LOW (ref 39.0–52.0)
Hemoglobin: 10.3 g/dL — ABNORMAL LOW (ref 13.0–17.0)
Immature Granulocytes: 0 %
Lymphocytes Relative: 40 %
Lymphs Abs: 1.4 10*3/uL (ref 0.7–4.0)
MCH: 29.9 pg (ref 26.0–34.0)
MCHC: 30.6 g/dL (ref 30.0–36.0)
MCV: 97.7 fL (ref 80.0–100.0)
Monocytes Absolute: 0.4 10*3/uL (ref 0.1–1.0)
Monocytes Relative: 11 %
NRBC: 0 % (ref 0.0–0.2)
Neutro Abs: 1.6 10*3/uL — ABNORMAL LOW (ref 1.7–7.7)
Neutrophils Relative %: 47 %
Platelets: 147 10*3/uL — ABNORMAL LOW (ref 150–400)
RBC: 3.45 MIL/uL — AB (ref 4.22–5.81)
RDW: 15.1 % (ref 11.5–15.5)
WBC: 3.4 10*3/uL — ABNORMAL LOW (ref 4.0–10.5)

## 2019-01-19 LAB — BASIC METABOLIC PANEL
Anion gap: 15 (ref 5–15)
BUN: 21 mg/dL (ref 8–23)
CO2: 28 mmol/L (ref 22–32)
Calcium: 8.7 mg/dL — ABNORMAL LOW (ref 8.9–10.3)
Chloride: 94 mmol/L — ABNORMAL LOW (ref 98–111)
Creatinine, Ser: 5.22 mg/dL — ABNORMAL HIGH (ref 0.61–1.24)
GFR calc Af Amer: 12 mL/min — ABNORMAL LOW (ref >60)
GFR, EST NON AFRICAN AMERICAN: 10 mL/min — AB (ref >60)
Glucose, Bld: 155 mg/dL — ABNORMAL HIGH (ref 70–99)
Potassium: 3.3 mmol/L — ABNORMAL LOW (ref 3.5–5.1)
Sodium: 137 mmol/L (ref 135–145)

## 2019-01-19 LAB — LACTIC ACID, PLASMA: LACTIC ACID, VENOUS: 1.6 mmol/L (ref 0.5–1.9)

## 2019-01-19 MED ORDER — TRAMADOL HCL 50 MG PO TABS: 50.0000 mg | ORAL_TABLET | Freq: Four times a day (QID) | ORAL | 0 refills | Status: DC | PRN

## 2019-01-19 MED ORDER — DOXYCYCLINE HYCLATE 100 MG PO CAPS: 100.0000 mg | ORAL_CAPSULE | Freq: Two times a day (BID) | ORAL | 0 refills | Status: DC

## 2019-01-19 NOTE — ED Triage Notes (Signed)
Pt states he has pain in right great toe and two smallest toes. Pt states he has pain in right groin.

## 2019-01-19 NOTE — ED Provider Notes (Signed)
[facility] EMERGENCY DEPARTMENT Provider Note   CSN: [id number] Arrival date & time: 01/19/19  1313     History   Chief Complaint Chief Complaint  Patient presents with  . Foot Pain  . Groin Pain    HPI Kevin Wadding Sr. is a 73 y.o. male.  HPI   He presents for evaluation of discomfort in toes of right foot, for 2 weeks.  He saw his podiatrist about it and was recommended to "use iodine on it."  He has been cleaning the wound on his right foot, with iodine for about 1 week.  He also complains of intermittent discomfort with swelling in the right groin.  He denies nausea, vomiting, diarrhea, cough, shortness of breath, weakness or dizziness.  He takes dialysis, and last dialyzed, yesterday.  There are no other known modifying factors.  Past Medical History:  Diagnosis Date  . Acute left systolic heart failure ([place]) 01/06/2016  . Adenoma of left adrenal gland 06/16/2014   CT Abdomen (06/04/2014): Incidental, 20 mm, 8.33 HU suggesting benign adenoma   . Alcohol abuse 02/15/2007   Reports consuming 1 pint of gin on weekends, does not drink every day. Denies history of seizures, blackouts, or tremors.    . Allergic rhinitis 04/09/2014  . Anemia 11/12/2013   Unclear cause as of yet (? EtOH abuse)   . Anemia of chronic kidney failure, stage 4 (severe) ([place]) 07/28/2017  . Benign prostatic hypertrophy 06/02/2014  . Chronic kidney disease, stage 3 ([place]) 11/12/2013  . Chronic systolic heart failure ([place]) 01/15/2016   Echo (01/07/2016): LVEF 35%  . COPD (chronic obstructive pulmonary disease) ([place])   . Coronary artery disease 02/15/2007   Non-STEMI 07/2005, s/p CABG x 4 (08/01/2005): LIMA to LAD, SVG to OM, RCA, PCA   . DVT (deep venous thrombosis) ([place]) 2006   RLE "when I had OHS"  . Erectile dysfunction associated with type 2 diabetes mellitus ([place]) 02/15/2007  . Essential hypertension 02/15/2007  . Hyperlipidemia 02/15/2007  . Major depression, single episode 11/02/2015    . Obesity (BMI 30.0-34.9) 02/15/2007  . Peripheral vascular occlusive disease ([place]) 12/30/2013   ABI (01/03/2014): Right 0.64, Left 0.64   . Pneumonia X 1  . Secondary hyperparathyroidism of renal origin ([place]) 07/28/2017  . Sigmoid diverticulosis 01/15/2016   Minimal per colonoscopy 01/11/2016  . Tobacco abuse 02/15/2007  . Tubular adenoma of colon 01/11/2016   Two 6 mm semi-pedunculated tubular adenomas excised endoscopically 01/11/2016.  Repeat colonoscopy due 12/2020.  . Type 2 diabetes mellitus with mild nonproliferative diabetic retinopathy with macular edema, bilateral ([place]) 10/28/2016  . Type 2 diabetes mellitus with stage 3 chronic kidney disease ([place]) 02/15/2007  . Venous thromboembolism 11/11/2013   LE Dopplers (01/2011): RLE DVT. CTA with mild subacute to chronic right-sided pulmonary emboli.  Patient has elected to continue anticoagulation.     Patient Active Problem List   Diagnosis Date Noted  . Anemia 09/25/2018  . Acute on chronic left systolic heart failure ([place]) 07/18/2018  . Rib pain on left side 07/12/2018  . Anemia of chronic kidney failure, stage 5 ([place]) 07/28/2017  . Secondary hyperparathyroidism of renal origin ([place]) 07/28/2017  . Type 2 diabetes mellitus with mild nonproliferative diabetic retinopathy with macular edema, bilateral ([place]) 10/28/2016  . Chronic systolic heart failure ([place]) 01/15/2016  . Sigmoid diverticulosis 01/15/2016  . Tubular adenoma of colon 01/11/2016  . Benign prostatic hyperplasia 06/02/2014  . Allergic rhinitis 04/09/2014  . Peripheral vascular occlusive disease ([place]) 12/30/2013  .  Chronic kidney disease, stage 5 ([place]) 11/12/2013  . Healthcare maintenance 11/12/2013  . History of venous thromboembolism 11/11/2013  . Type 2 diabetes mellitus with stage 5 chronic kidney disease ([place]) 02/15/2007  . Hyperlipidemia 02/15/2007  . Obesity (BMI 30.0-34.9) 02/15/2007  . Erectile dysfunction associated with type 2 diabetes mellitus ([place]) 02/15/2007  .  Alcohol abuse 02/15/2007  . Tobacco abuse 02/15/2007  . Essential hypertension 02/15/2007  . Coronary artery disease involving autologous vein coronary bypass graft with angina pectoris ([place]) 02/15/2007    Past Surgical History:  Procedure Laterality Date  . AV FISTULA PLACEMENT  07/23/2018   Procedure: RIGHT UPPER ARM ARTERIOVENOUS (AV) BRACHIOCEPHALIC FISTULA CREATION;  Surgeon: Conrad Hanston, MD;  Location: [facility];  Service: Vascular;;  . CARDIAC CATHETERIZATION  07/2005  . CATARACT EXTRACTION W/ INTRAOCULAR LENS  IMPLANT, BILATERAL Bilateral 04/2014   Left 04/30/2014, Right 05/07/2014  . COLONOSCOPY WITH PROPOFOL N/A 01/11/2016   Procedure: COLONOSCOPY WITH PROPOFOL;  Surgeon: Ronald Lobo, MD;  Location: [facility];  Service: Endoscopy;  Laterality: N/A;  Needs MAC.  Marland Kitchen CORONARY ARTERY BYPASS GRAFT  08/01/2005   x 4, LIMA to LAD, SVG to OM, RCA, PCA  . ESOPHAGOGASTRODUODENOSCOPY N/A 01/08/2016   Procedure: ESOPHAGOGASTRODUODENOSCOPY (EGD);  Surgeon: Wilford Corner, MD;  Location: [facility];  Service: Endoscopy;  Laterality: N/A;  . GIVENS CAPSULE STUDY N/A 01/12/2016   Procedure: GIVENS CAPSULE STUDY;  Surgeon: Ronald Lobo, MD;  Location: [facility];  Service: Endoscopy;  Laterality: N/A;  . INSERTION OF DIALYSIS CATHETER Right 07/23/2018   Procedure: INSERTION OF  DIALYSIS CATHETER - RIGHT INTERNAL JUGULAR PLACEMENT;  Surgeon: Conrad Metuchen, MD;  Location: [facility];  Service: Vascular;  Laterality: Right;        Home Medications    Prior to Admission medications   Medication Sig Start Date End Date Taking? Authorizing Provider  allopurinol (ZYLOPRIM) 100 MG tablet Take 1 tablet (100 mg total) by mouth daily. 07/27/18   Dixie Dials, MD  atorvastatin (LIPITOR) 20 MG tablet Take 1 tablet (20 mg total) by mouth daily. 03/02/18   Oval Linsey, MD  B Complex-C-Zn-Folic Acid (DIALYVITE/ZINC) TABS Take 1 tablet by mouth daily. 08/03/18   [provider]  Darbepoetin Alfa  (ARANESP) 100 MCG/0.5ML SOSY injection Inject 0.5 mLs (100 mcg total) into the vein every Thursday with hemodialysis. 08/02/18   Dixie Dials, MD  doxycycline (VIBRAMYCIN) 100 MG capsule Take 1 capsule (100 mg total) by mouth 2 (two) times daily. One po bid x 7 days 01/19/19   Daleen Bo, MD  ferric gluconate 250 mg in sodium chloride 0.9 % 100 mL Inject 250 mg into the vein every Monday, Wednesday, and Friday with hemodialysis. 07/27/18   Dixie Dials, MD  finasteride (PROSCAR) 5 MG tablet Take 1 tablet (5 mg total) by mouth daily. 06/01/18   Oval Linsey, MD  fluticasone (FLONASE) 50 MCG/ACT nasal spray Place 1 spray into both nostrils as needed for allergies or rhinitis.    [provider]  furosemide (LASIX) 80 MG tablet  09/28/18   [provider]  heparin 1000 unit/mL SOLN injection 1 mL (1,000 Units total) by Dialysis route as needed (in dialysis). 07/26/18   Dixie Dials, MD  labetalol (NORMODYNE) 200 MG tablet Take 200 mg by mouth 2 (two) times daily.    [provider]  lidocaine-prilocaine (EMLA) cream Apply 1 application topically as needed (topical anesthesia for hemodialysis if Gebauers and Lidocaine injection are ineffective.). 07/26/18   Dixie Dials, MD  pentafluoroprop-tetrafluoroeth (GEBAUERS) AERO Apply 1 application topically as needed (topical anesthesia for hemodialysis). 07/26/18   Dixie Dials, MD  sodium chloride 0.9 % infusion Inject 250 mLs into the vein as needed (for IV line care(Saline / Heparin Lock)). 07/26/18   Dixie Dials, MD  sodium chloride 0.9 % infusion Inject 100 mLs into the vein as needed (symptomatic hypotension or decrease in SBP < 90 mmHg). 07/26/18   Dixie Dials, MD  sodium chloride 0.9 % infusion Inject 100 mLs into the vein as needed (severe cramping). 07/26/18   Dixie Dials, MD  traMADol (ULTRAM) 50 MG tablet Take 1 tablet (50 mg total) by mouth every 6 (six) hours as needed. 01/19/19   Daleen Bo, MD    Family  History Family History  Problem Relation Age of Onset  . Heart disease Father   . COPD Father   . Diabetes Mellitus II Mother   . Healthy Sister   . Healthy Brother   . Healthy Daughter   . Healthy Son   . Healthy Sister   . Healthy Sister   . Healthy Sister   . Healthy Sister   . Healthy Daughter   . Healthy Son   . Healthy Son   . Healthy Son   . Healthy Son   . Healthy Son   . Healthy Son     Social History Social History   Tobacco Use  . Smoking status: Current Every Day Smoker    Packs/day: 0.50    Years: 60.00    Pack years: 30.00    Types: Cigarettes  . Smokeless tobacco: Former Systems developer    Types: Snuff, Chew  Substance Use Topics  . Alcohol use: Yes    Alcohol/week: 5.0 standard drinks    Types: 5 Shots of liquor per week    Comment: 07/18/2018 Drinks 1/2 pint of gin over the weekend but does not drink everyday. Denies hx of blackouts, seizures, tremors.  . Drug use: No     Allergies   Nicotine transdermal system [nicotine]   Review of Systems Review of Systems  All other systems reviewed and are negative.    Physical Exam Updated Vital Signs BP (!) 148/85 (BP Location: Left Arm)   Pulse 90   Temp 98.8 F (37.1 C) (Oral)   Resp 16   Ht _0  (1.702 m)   Wt 81.2 kg   SpO2 98%   BMI 28.04 kg/m   Physical Exam Vitals signs and nursing note reviewed.  Constitutional:      General: He is not in acute distress.    Appearance: He is well-developed. He is not ill-appearing or diaphoretic.  HENT:     Head: Normocephalic and atraumatic.     Right Ear: External ear normal.     Left Ear: External ear normal.  Eyes:     Conjunctiva/sclera: Conjunctivae normal.     Pupils: Pupils are equal, round, and reactive to light.  Neck:     Musculoskeletal: Normal range of motion and neck supple.     Trachea: Phonation normal.  Cardiovascular:     Rate and Rhythm: Normal rate and regular rhythm.     Heart sounds: Normal heart sounds.  Pulmonary:      Effort: Pulmonary effort is normal.     Breath sounds: Normal breath sounds.  Abdominal:     Palpations: Abdomen is soft.     Tenderness: There is no abdominal tenderness.  Genitourinary:    Comments: Normal penis and scrotum.  Reducible  right inguinal hernia without associated tenderness, or skin changes. Musculoskeletal: Normal range of motion.     Comments: Tender right foot, fourth webspace with skin breakdown, very mild.  No associated bleeding.  No drainage.  Mild tenderness of the right lateral forefoot.  No significant tenderness of the right midfoot, ankle or lower leg.  Skin:    General: Skin is warm and dry.  Neurological:     Mental Status: He is alert and oriented to person, place, and time.     Cranial Nerves: No cranial nerve deficit.     Sensory: No sensory deficit.     Motor: No abnormal muscle tone.     Coordination: Coordination normal.  Psychiatric:        Behavior: Behavior normal.        Thought Content: Thought content normal.        Judgment: Judgment normal.      ED Treatments / Results  Labs (all labs ordered are listed, but only abnormal results are displayed) Labs Reviewed  BASIC METABOLIC PANEL - Abnormal; Notable for the following components:      Result Value   Potassium 3.3 (*)    Chloride 94 (*)    Glucose, Bld 155 (*)    Creatinine, Ser 5.22 (*)    Calcium 8.7 (*)    GFR calc non Af Amer 10 (*)    GFR calc Af Amer 12 (*)    All other components within normal limits  CBC WITH DIFFERENTIAL/PLATELET - Abnormal; Notable for the following components:   WBC 3.4 (*)    RBC 3.45 (*)    Hemoglobin 10.3 (*)    HCT 33.7 (*)    Platelets 147 (*)    Neutro Abs 1.6 (*)    All other components within normal limits  LACTIC ACID, PLASMA  LACTIC ACID, PLASMA    EKG None  Radiology Dg Foot Complete Right  Result Date: 01/19/2019 CLINICAL DATA:  73 y/o male with chronic RIGHT foot pain x1 month. NKI. Hx DM. It appears that on 09-27-18 Dr. Barkley Bruns  debrided ulcer between right 4th and 5th toes. pt describes pains at great toe and 4th and 5th toes. Able to ambulate with pain. EXAM: RIGHT FOOT COMPLETE - 3+ VIEW COMPARISON:  09/01/2011 FINDINGS: No fracture or bone lesion. No areas of bone resorption to suggest osteomyelitis. Joints are normally aligned. No soft tissue air. There are arterial vascular calcifications. Soft tissues are otherwise unremarkable. IMPRESSION: 1. No fracture, dislocation or evidence of osteomyelitis. Electronically Signed   By: Lajean Manes M.D.   On: 01/19/2019 15:02    Procedures Procedures (including critical care time)  Medications Ordered in ED Medications - No data to display   Initial Impression / Assessment and Plan / ED Course  I have reviewed the triage vital signs and the nursing notes.  Pertinent labs & imaging results that were available during my care of the patient were reviewed by me and considered in my medical decision making (see chart for details).  Clinical Course as of Jan 19 1642  Sat Jan 19, 2019  1551 Reevaluation for assessment of vascular functioning in the feet.  Using Doppler I was able to get a right posterior tibial pulse which is monophasic.  Unable to find a dorsalis pedis pulse on the right.  Incidentally, pulses with Doppler were absent left dorsalis pedis and posterior tibial.   [EW]    Clinical Course User Index [EW] Daleen Bo, MD  Patient Vitals for the past 24 hrs:  BP Temp Temp src Pulse Resp SpO2 Height Weight  01/19/19 1638 (!) 148/85 - - 90 16 98 % - -  01/19/19 1415 134/63 - - 85 - 98 % - -  01/19/19 1400 (!) 137/59 - - 85 - 97 % - -  01/19/19 1343 - - - - - - _0  (1.702 m) 81.2 kg  01/19/19 1342 - 98.8 F (37.1 C) Oral - - - - -    4:43 PM Reevaluation with update and discussion. After initial assessment and treatment, an updated evaluation reveals the patient is comfortable this time.  Findings discussed with the patient and all questions were  answered. Daleen Bo   Medical Decision Making: Wound right foot, webspace, with likely cellulitis of the forefoot.  Patient is nontoxic.  Suspect the wound is healing poorly because of poor arterial blood flow.  No acute limb threatening ischemia at this time.  He is a hemodialysis patient.  We will plan for outpatient follow-up with vascular surgery, and regular care providers.  CRITICAL CARE-no Performed by: Daleen Bo  Nursing Notes Reviewed/ Care Coordinated Applicable Imaging Reviewed Interpretation of Laboratory Data incorporated into ED treatment  The patient appears reasonably screened and/or stabilized for discharge and I doubt any other medical condition or other [facility] requiring further screening, evaluation, or treatment in the ED at this time prior to discharge.  Plan: Home Medications-continue usual medications; Home Treatments-continue wound care with soaking.  Keep right foot in position of comfort; return here if the recommended treatment, does not improve the symptoms; Recommended follow up-patient vascular surgery follow-up.  Hemodialysis as scheduled  Final Clinical Impressions(s) / ED Diagnoses   Final diagnoses:  Right foot pain  Right groin hernia    ED Discharge Orders         Ordered    traMADol (ULTRAM) 50 MG tablet  Every 6 hours PRN     01/19/19 1643    doxycycline (VIBRAMYCIN) 100 MG capsule  2 times daily     01/19/19 1643           Daleen Bo, MD 01/19/19 [phone]

## 2019-01-19 NOTE — Discharge Instructions (Signed)
The pain in your right foot, soak it in warm water 2 or 3 times a day.  Clean the areas between your toes well with soap and water.  Call the doctors listed for follow-up appointments as soon as possible.  You will need to see the vascular doctor regarding the discomfort in your feet to check on your blood flow.  You will need to see the general surgeon to be evaluated for the swelling in the right groin which appears to be a hernia.  The primary care doctor can help you to manage all of your problems.

## 2019-01-21 DIAGNOSIS — N186 End stage renal disease: Secondary | ICD-10-CM | POA: Diagnosis not present

## 2019-01-21 DIAGNOSIS — N2581 Secondary hyperparathyroidism of renal origin: Secondary | ICD-10-CM | POA: Diagnosis not present

## 2019-01-23 DIAGNOSIS — N186 End stage renal disease: Secondary | ICD-10-CM | POA: Diagnosis not present

## 2019-01-23 DIAGNOSIS — N2581 Secondary hyperparathyroidism of renal origin: Secondary | ICD-10-CM | POA: Diagnosis not present

## 2019-01-25 DIAGNOSIS — N2581 Secondary hyperparathyroidism of renal origin: Secondary | ICD-10-CM | POA: Diagnosis not present

## 2019-01-25 DIAGNOSIS — N186 End stage renal disease: Secondary | ICD-10-CM | POA: Diagnosis not present

## 2019-01-28 DIAGNOSIS — N186 End stage renal disease: Secondary | ICD-10-CM | POA: Diagnosis not present

## 2019-01-28 DIAGNOSIS — N2581 Secondary hyperparathyroidism of renal origin: Secondary | ICD-10-CM | POA: Diagnosis not present

## 2019-01-30 DIAGNOSIS — N186 End stage renal disease: Secondary | ICD-10-CM | POA: Diagnosis not present

## 2019-01-30 DIAGNOSIS — N2581 Secondary hyperparathyroidism of renal origin: Secondary | ICD-10-CM | POA: Diagnosis not present

## 2019-01-31 DIAGNOSIS — Z992 Dependence on renal dialysis: Secondary | ICD-10-CM | POA: Diagnosis not present

## 2019-01-31 DIAGNOSIS — I871 Compression of vein: Secondary | ICD-10-CM | POA: Diagnosis not present

## 2019-01-31 DIAGNOSIS — T82898A Other specified complication of vascular prosthetic devices, implants and grafts, initial encounter: Secondary | ICD-10-CM | POA: Diagnosis not present

## 2019-01-31 DIAGNOSIS — N186 End stage renal disease: Secondary | ICD-10-CM | POA: Diagnosis not present

## 2019-02-01 DIAGNOSIS — N186 End stage renal disease: Secondary | ICD-10-CM | POA: Diagnosis not present

## 2019-02-01 DIAGNOSIS — N2581 Secondary hyperparathyroidism of renal origin: Secondary | ICD-10-CM | POA: Diagnosis not present

## 2019-02-04 DIAGNOSIS — N2581 Secondary hyperparathyroidism of renal origin: Secondary | ICD-10-CM | POA: Diagnosis not present

## 2019-02-04 DIAGNOSIS — N186 End stage renal disease: Secondary | ICD-10-CM | POA: Diagnosis not present

## 2019-02-05 ENCOUNTER — Other Ambulatory Visit: Payer: Self-pay

## 2019-02-05 ENCOUNTER — Ambulatory Visit (INDEPENDENT_AMBULATORY_CARE_PROVIDER_SITE_OTHER): Payer: Medicare HMO | Admitting: Physician Assistant

## 2019-02-05 ENCOUNTER — Encounter: Payer: Self-pay | Admitting: Family

## 2019-02-05 ENCOUNTER — Ambulatory Visit (HOSPITAL_COMMUNITY)
Admission: RE | Admit: 2019-02-05 | Discharge: 2019-02-05 | Disposition: A | Discharge status: Home / Self Care | Payer: Medicare HMO | Source: Ambulatory Visit | Attending: Vascular Surgery | Admitting: Vascular Surgery

## 2019-02-05 VITALS — BP 159/72 | HR 93 | Temp 97.5°F | Resp 18 | Ht 67.0 in | Wt 168.7 lb

## 2019-02-05 DIAGNOSIS — I739 Peripheral vascular disease, unspecified: Secondary | ICD-10-CM | POA: Insufficient documentation

## 2019-02-05 NOTE — Progress Notes (Signed)
History of Present Illness:  Patient is a 73 y.o. year old male who presents for evaluation of PAD with right history of right 4th and 5th toe ulcers.    He states the ulcer on his lateral 4 th toe between his toes"kissing corn" has returned.  He states he has pain that wakes him up at night and tylenol does not help.  He denies fever and chills.     Atherosclerotic risk factors and other medical problems include Tobacco abuse, DM, Hyperlipidemia, HTN, and ESRD.   HD M-W-F.  Past Medical History:  Diagnosis Date  . Acute left systolic heart failure ([place]) 01/06/2016  . Adenoma of left adrenal gland 06/16/2014   CT Abdomen (06/04/2014): Incidental, 20 mm, 8.33 HU suggesting benign adenoma   . Alcohol abuse 02/15/2007   Reports consuming 1 pint of gin on weekends, does not drink every day. Denies history of seizures, blackouts, or tremors.    . Allergic rhinitis 04/09/2014  . Anemia 11/12/2013   Unclear cause as of yet (? EtOH abuse)   . Anemia of chronic kidney failure, stage 4 (severe) ([place]) 07/28/2017  . Benign prostatic hypertrophy 06/02/2014  . Chronic kidney disease, stage 3 ([place]) 11/12/2013  . Chronic systolic heart failure ([place]) 01/15/2016   Echo (01/07/2016): LVEF 35%  . COPD (chronic obstructive pulmonary disease) ([place])   . Coronary artery disease 02/15/2007   Non-STEMI 07/2005, s/p CABG x 4 (08/01/2005): LIMA to LAD, SVG to OM, RCA, PCA   . DVT (deep venous thrombosis) ([place]) 2006   RLE "when I had OHS"  . Erectile dysfunction associated with type 2 diabetes mellitus ([place]) 02/15/2007  . Essential hypertension 02/15/2007  . Hyperlipidemia 02/15/2007  . Major depression, single episode 11/02/2015  . Obesity (BMI 30.0-34.9) 02/15/2007  . Peripheral vascular occlusive disease ([place]) 12/30/2013   ABI (01/03/2014): Right 0.64, Left 0.64   . Pneumonia X 1  . Secondary hyperparathyroidism of renal origin ([place]) 07/28/2017  . Sigmoid diverticulosis 01/15/2016   Minimal per colonoscopy 01/11/2016  .  Tobacco abuse 02/15/2007  . Tubular adenoma of colon 01/11/2016   Two 6 mm semi-pedunculated tubular adenomas excised endoscopically 01/11/2016.  Repeat colonoscopy due 12/2020.  . Type 2 diabetes mellitus with mild nonproliferative diabetic retinopathy with macular edema, bilateral ([place]) 10/28/2016  . Type 2 diabetes mellitus with stage 3 chronic kidney disease ([place]) 02/15/2007  . Venous thromboembolism 11/11/2013   LE Dopplers (01/2011): RLE DVT. CTA with mild subacute to chronic right-sided pulmonary emboli.  Patient has elected to continue anticoagulation.     Past Surgical History:  Procedure Laterality Date  . AV FISTULA PLACEMENT  07/23/2018   Procedure: RIGHT UPPER ARM ARTERIOVENOUS (AV) BRACHIOCEPHALIC FISTULA CREATION;  Surgeon: Conrad Breckenridge, MD;  Location: [facility];  Service: Vascular;;  . CARDIAC CATHETERIZATION  07/2005  . CATARACT EXTRACTION W/ INTRAOCULAR LENS  IMPLANT, BILATERAL Bilateral 04/2014   Left 04/30/2014, Right 05/07/2014  . COLONOSCOPY WITH PROPOFOL N/A 01/11/2016   Procedure: COLONOSCOPY WITH PROPOFOL;  Surgeon: Ronald Lobo, MD;  Location: [facility];  Service: Endoscopy;  Laterality: N/A;  Needs MAC.  Marland Kitchen CORONARY ARTERY BYPASS GRAFT  08/01/2005   x 4, LIMA to LAD, SVG to OM, RCA, PCA  . ESOPHAGOGASTRODUODENOSCOPY N/A 01/08/2016   Procedure: ESOPHAGOGASTRODUODENOSCOPY (EGD);  Surgeon: Wilford Corner, MD;  [facility] ENDOSCOPY;  Service: Endoscopy;  Laterality: N/A;  . GIVENS CAPSULE STUDY N/A 01/12/2016   Procedure: GIVENS CAPSULE STUDY;  Surgeon: Ronald Lobo, MD;  Location[facility]  ENDOSCOPY;  Service: Endoscopy;  Laterality: N/A;  . INSERTION OF DIALYSIS CATHETER Right 07/23/2018   Procedure: INSERTION OF  DIALYSIS CATHETER - RIGHT INTERNAL JUGULAR PLACEMENT;  Surgeon: Conrad Boyceville, MD;  Location: [facility] OR;  Service: Vascular;  Laterality: Right;    ROS:   General:  No weight loss, Fever, chills  HEENT: No recent headaches, no nasal bleeding, no visual changes, no sore  throat  Neurologic: No dizziness, blackouts, seizures. No recent symptoms of stroke or mini- stroke. No recent episodes of slurred speech, or temporary blindness.  Cardiac: No recent episodes of chest pain/pressure, no shortness of breath at rest.  No shortness of breath with exertion.  Denies history of atrial fibrillation or irregular heartbeat  Vascular: No history of rest pain in feet.  No history of claudication.  No history of non-healing ulcer, No history of DVT   Pulmonary: No home oxygen, no productive cough, no hemoptysis,  No asthma or wheezing  Musculoskeletal:  _0  Arthritis, _1  Low back pain,  _2  Joint pain  Hematologic:No history of hypercoagulable state.  No history of easy bleeding.  No history of anemia  Gastrointestinal: No hematochezia or melena,  No gastroesophageal reflux, no trouble swallowing  Urinary: _3  chronic Kidney disease, _4  on HD - _5  MWF or _6  TTHS, _7  Burning with urination, _8  Frequent urination, _9  Difficulty urinating;   Skin: No rashes  Psychological: No history of anxiety,  No history of depression  Social History Social History   Tobacco Use  . Smoking status: Current Every Day Smoker    Packs/day: 0.50    Years: 60.00    Pack years: 30.00    Types: Cigarettes  . Smokeless tobacco: Former Systems developer    Types: Snuff, Chew  Substance Use Topics  . Alcohol use: Yes    Alcohol/week: 5.0 standard drinks    Types: 5 Shots of liquor per week    Comment: 07/18/2018 Drinks 1/2 pint of gin over the weekend but does not drink everyday. Denies hx of blackouts, seizures, tremors.  . Drug use: No    Family History Family History  Problem Relation Age of Onset  . Heart disease Father   . COPD Father   . Diabetes Mellitus II Mother   . Healthy Sister   . Healthy Brother   . Healthy Daughter   . Healthy Son   . Healthy Sister   . Healthy Sister   . Healthy Sister   . Healthy Sister   . Healthy Daughter   . Healthy Son   . Healthy Son     . Healthy Son   . Healthy Son   . Healthy Son   . Healthy Son     Allergies  Allergies  Allergen Reactions  . Nicotine Transdermal System [Nicotine] Other (See Comments)    Vivid dreams     Current Outpatient Medications  Medication Sig Dispense Refill  . allopurinol (ZYLOPRIM) 100 MG tablet Take 1 tablet (100 mg total) by mouth daily. 30 tablet 3  . atorvastatin (LIPITOR) 20 MG tablet Take 1 tablet (20 mg total) by mouth daily. 90 tablet 3  . B Complex-C-Zn-Folic Acid (DIALYVITE/ZINC) TABS Take 1 tablet by mouth daily.  2  . Darbepoetin Alfa (ARANESP) 100 MCG/0.5ML SOSY injection Inject 0.5 mLs (100 mcg total) into the vein every Thursday with hemodialysis.    Marland Kitchen doxycycline (VIBRAMYCIN) 100 MG capsule Take 1 capsule (100 mg total) by mouth 2 (  two) times daily. One po bid x 7 days 14 capsule 0  . ferric gluconate 250 mg in sodium chloride 0.9 % 100 mL Inject 250 mg into the vein every Monday, Wednesday, and Friday with hemodialysis.    . finasteride (PROSCAR) 5 MG tablet Take 1 tablet (5 mg total) by mouth daily. 30 tablet 11  . fluticasone (FLONASE) 50 MCG/ACT nasal spray Place 1 spray into both nostrils as needed for allergies or rhinitis.    . furosemide (LASIX) 80 MG tablet     . heparin 1000 unit/mL SOLN injection 1 mL (1,000 Units total) by Dialysis route as needed (in dialysis).    . labetalol (NORMODYNE) 200 MG tablet Take 200 mg by mouth 2 (two) times daily.    Marland Kitchen lidocaine-prilocaine (EMLA) cream Apply 1 application topically as needed (topical anesthesia for hemodialysis if Gebauers and Lidocaine injection are ineffective.).    Marland Kitchen pentafluoroprop-tetrafluoroeth (GEBAUERS) AERO Apply 1 application topically as needed (topical anesthesia for hemodialysis).  0  . sodium chloride 0.9 % infusion Inject 250 mLs into the vein as needed (for IV line care(Saline / Heparin Lock)).  0  . sodium chloride 0.9 % infusion Inject 100 mLs into the vein as needed (symptomatic hypotension or  decrease in SBP < 90 mmHg).  0  . sodium chloride 0.9 % infusion Inject 100 mLs into the vein as needed (severe cramping).  0  . traMADol (ULTRAM) 50 MG tablet Take 1 tablet (50 mg total) by mouth every 6 (six) hours as needed. 15 tablet 0   No current facility-administered medications for this visit.     Physical Examination  Vitals:   02/05/19 1124  BP: (!) 159/72  Pulse: 93  Resp: 18  Temp: (!) 97.5 F (36.4 C)  TempSrc: Oral  SpO2: 97%  Weight: 168 lb 10.4 oz (76.5 kg)  Height: _0  (1.702 m)    Body mass index is 26.41 kg/m.  General:  Alert and oriented, no acute distress HEENT: Normal, normocephalic Neck: No bruit or JVD Pulmonary: Clear to auscultation bilaterally Cardiac: Regular Rate and Rhythm without murmur Abdomen: Soft, non-tender, non-distended, no mass, no scars Skin: No rash Extremity Pulses:  2+ radial, brachial, femoral, non palpable pedal pulses B.  B LE skin is warm to touch.  "kissing corn-open wound 4th toe between 4th and 5th toes.   Musculoskeletal: No deformity or edema  Neurologic: Upper and lower extremity motor 5/5 and symmetric  DATA:    ABI Findings: +---------+------------------+-----+--------+----------------------------------+ Right    Rt Pressure (mmHg)IndexWaveformComment                            +---------+------------------+-----+--------+----------------------------------+ Brachial                                AVF                                +---------+------------------+-----+--------+----------------------------------+ PTA      0                 0.00 absent                                     +---------+------------------+-----+--------+----------------------------------+ DP       0  0.00 absent                                     +---------+------------------+-----+--------+----------------------------------+ Great Toe                       absent  patient did not want toe  cuff                                              placed due to pain                 +---------+------------------+-----+--------+----------------------------------+  +---------+------------------+-----+----------+---------------------+ Left     Lt Pressure (mmHg)IndexWaveform  Comment               +---------+------------------+-----+----------+---------------------+ Brachial 146                                                    +---------+------------------+-----+----------+---------------------+ PTA      206               1.41 monophasicdifficult to identify +---------+------------------+-----+----------+---------------------+ DP       0                 0.00 absent                          +---------+------------------+-----+----------+---------------------+ Great Toe0                 0.00 absent                          +---------+------------------+-----+----------+---------------------+  +-------+-----------+-----------+------------+------------+ ABI/TBIToday's ABIToday's TBIPrevious ABIPrevious TBI +-------+-----------+-----------+------------+------------+ Right  0          0          0           0            +-------+-----------+-----------+------------+------------+ Left   1.41       0          0.73        0            +-------+-----------+-----------+------------+------------+   Arterial wall calcification precludes accurate ankle pressures and ABIs. Previous study performed on 11/12/2018.   Summary: Right: Resting right ankle-brachial index indicates critical limb ischemia. The right toe-brachial index is abnormal.  Left: Resting left ankle-brachial index indicates noncompressible left lower extremity arteries.The left toe-brachial index is abnormal. LT Great toe pressure = 0 mmHg.    ASSESSMENT:  Infringing arterial insufficieny. PAD with right 4th toe ulcer and 0 pressures on ABI's Rest pain now reported  on the right LE.   PLAN:  He has palpable B femoral pulses.  I have scheduled him for angiogram with possible intervention on the right LE.  He has HD MWF.     Roxy Horseman PA-C [facility] Office: [phone]

## 2019-02-06 DIAGNOSIS — N186 End stage renal disease: Secondary | ICD-10-CM | POA: Diagnosis not present

## 2019-02-06 DIAGNOSIS — N2581 Secondary hyperparathyroidism of renal origin: Secondary | ICD-10-CM | POA: Diagnosis not present

## 2019-02-07 DIAGNOSIS — L97519 Non-pressure chronic ulcer of other part of right foot with unspecified severity: Secondary | ICD-10-CM | POA: Diagnosis not present

## 2019-02-07 DIAGNOSIS — I739 Peripheral vascular disease, unspecified: Secondary | ICD-10-CM | POA: Diagnosis not present

## 2019-02-08 DIAGNOSIS — N2581 Secondary hyperparathyroidism of renal origin: Secondary | ICD-10-CM | POA: Diagnosis not present

## 2019-02-08 DIAGNOSIS — N186 End stage renal disease: Secondary | ICD-10-CM | POA: Diagnosis not present

## 2019-02-11 DIAGNOSIS — N186 End stage renal disease: Secondary | ICD-10-CM | POA: Diagnosis not present

## 2019-02-11 DIAGNOSIS — N2581 Secondary hyperparathyroidism of renal origin: Secondary | ICD-10-CM | POA: Diagnosis not present

## 2019-02-12 ENCOUNTER — Ambulatory Visit: Payer: Medicare HMO | Admitting: Family

## 2019-02-12 ENCOUNTER — Encounter (HOSPITAL_COMMUNITY): Payer: Medicare HMO

## 2019-02-13 DIAGNOSIS — N186 End stage renal disease: Secondary | ICD-10-CM | POA: Diagnosis not present

## 2019-02-13 DIAGNOSIS — N2581 Secondary hyperparathyroidism of renal origin: Secondary | ICD-10-CM | POA: Diagnosis not present

## 2019-02-14 ENCOUNTER — Encounter (HOSPITAL_COMMUNITY)
Admission: RE | Disposition: A | Discharge status: Home / Self Care | Payer: Self-pay | Source: Home / Self Care | Attending: Vascular Surgery

## 2019-02-14 ENCOUNTER — Other Ambulatory Visit: Payer: Self-pay

## 2019-02-14 ENCOUNTER — Ambulatory Visit (HOSPITAL_COMMUNITY)
Admission: RE | Admit: 2019-02-14 | Discharge: 2019-02-14 | Disposition: A | Discharge status: Home / Self Care | Payer: Medicare HMO | Attending: Vascular Surgery | Admitting: Vascular Surgery

## 2019-02-14 DIAGNOSIS — I5022 Chronic systolic (congestive) heart failure: Secondary | ICD-10-CM | POA: Diagnosis not present

## 2019-02-14 DIAGNOSIS — E1122 Type 2 diabetes mellitus with diabetic chronic kidney disease: Secondary | ICD-10-CM | POA: Diagnosis not present

## 2019-02-14 DIAGNOSIS — E11621 Type 2 diabetes mellitus with foot ulcer: Secondary | ICD-10-CM | POA: Diagnosis not present

## 2019-02-14 DIAGNOSIS — Z992 Dependence on renal dialysis: Secondary | ICD-10-CM | POA: Diagnosis not present

## 2019-02-14 DIAGNOSIS — D631 Anemia in chronic kidney disease: Secondary | ICD-10-CM | POA: Insufficient documentation

## 2019-02-14 DIAGNOSIS — E785 Hyperlipidemia, unspecified: Secondary | ICD-10-CM | POA: Diagnosis not present

## 2019-02-14 DIAGNOSIS — I739 Peripheral vascular disease, unspecified: Secondary | ICD-10-CM | POA: Diagnosis not present

## 2019-02-14 DIAGNOSIS — N186 End stage renal disease: Secondary | ICD-10-CM | POA: Diagnosis not present

## 2019-02-14 DIAGNOSIS — Z79899 Other long term (current) drug therapy: Secondary | ICD-10-CM | POA: Insufficient documentation

## 2019-02-14 DIAGNOSIS — L97519 Non-pressure chronic ulcer of other part of right foot with unspecified severity: Secondary | ICD-10-CM | POA: Diagnosis not present

## 2019-02-14 DIAGNOSIS — I2510 Atherosclerotic heart disease of native coronary artery without angina pectoris: Secondary | ICD-10-CM | POA: Insufficient documentation

## 2019-02-14 DIAGNOSIS — F17210 Nicotine dependence, cigarettes, uncomplicated: Secondary | ICD-10-CM | POA: Insufficient documentation

## 2019-02-14 DIAGNOSIS — I132 Hypertensive heart and chronic kidney disease with heart failure and with stage 5 chronic kidney disease, or end stage renal disease: Secondary | ICD-10-CM | POA: Insufficient documentation

## 2019-02-14 DIAGNOSIS — N185 Chronic kidney disease, stage 5: Secondary | ICD-10-CM | POA: Diagnosis not present

## 2019-02-14 DIAGNOSIS — E1151 Type 2 diabetes mellitus with diabetic peripheral angiopathy without gangrene: Secondary | ICD-10-CM | POA: Insufficient documentation

## 2019-02-14 DIAGNOSIS — F329 Major depressive disorder, single episode, unspecified: Secondary | ICD-10-CM | POA: Insufficient documentation

## 2019-02-14 DIAGNOSIS — J449 Chronic obstructive pulmonary disease, unspecified: Secondary | ICD-10-CM | POA: Insufficient documentation

## 2019-02-14 HISTORY — PX: ABDOMINAL AORTOGRAM W/LOWER EXTREMITY: CATH118223

## 2019-02-14 LAB — POCT I-STAT 4, (NA,K, GLUC, HGB,HCT)
Glucose, Bld: 126 mg/dL — ABNORMAL HIGH (ref 70–99)
Glucose, Bld: 164 mg/dL — ABNORMAL HIGH (ref 70–99)
HCT: 35 % — ABNORMAL LOW (ref 39.0–52.0)
HCT: 33 % — ABNORMAL LOW (ref 39.0–52.0)
Hemoglobin: 11.9 g/dL — ABNORMAL LOW (ref 13.0–17.0)
Hemoglobin: 11.2 g/dL — ABNORMAL LOW (ref 13.0–17.0)
Potassium: 4.4 mmol/L (ref 3.5–5.1)
Potassium: 4.5 mmol/L (ref 3.5–5.1)
SODIUM: 141 mmol/L (ref 135–145)
Sodium: 132 mmol/L — ABNORMAL LOW (ref 135–145)

## 2019-02-14 LAB — GLUCOSE, CAPILLARY
Glucose-Capillary: 75 mg/dL (ref 70–99)
Glucose-Capillary: 89 mg/dL (ref 70–99)

## 2019-02-14 SURGERY — ABDOMINAL AORTOGRAM W/LOWER EXTREMITY
Anesthesia: LOCAL

## 2019-02-14 MED ORDER — OXYCODONE HCL 5 MG PO TABS
ORAL_TABLET | ORAL | Status: AC
  Filled 2019-02-14: qty 1

## 2019-02-14 MED ORDER — SODIUM CHLORIDE 0.9 % IV SOLN: 250.0000 mL | INTRAVENOUS | Status: DC | PRN

## 2019-02-14 MED ORDER — LIDOCAINE HCL (PF) 1 % IJ SOLN
INTRAMUSCULAR | Status: AC
  Filled 2019-02-14: qty 30

## 2019-02-14 MED ORDER — OXYCODONE HCL 5 MG PO TABS
5.0000 mg | ORAL_TABLET | ORAL | Status: DC | PRN
  Administered 2019-02-14 (×2): 5 mg via ORAL
  Filled 2019-02-14: qty 1

## 2019-02-14 MED ORDER — SODIUM CHLORIDE 0.9% FLUSH: 3.0000 mL | INTRAVENOUS | Status: DC | PRN

## 2019-02-14 MED ORDER — LIDOCAINE HCL (PF) 1 % IJ SOLN
INTRAMUSCULAR | Status: DC | PRN
  Administered 2019-02-14: 15 mL via INTRADERMAL

## 2019-02-14 MED ORDER — HEPARIN (PORCINE) IN NACL 1000-0.9 UT/500ML-% IV SOLN
INTRAVENOUS | Status: AC
  Filled 2019-02-14: qty 1000

## 2019-02-14 MED ORDER — HEPARIN (PORCINE) IN NACL 1000-0.9 UT/500ML-% IV SOLN
INTRAVENOUS | Status: DC | PRN
  Administered 2019-02-14 (×2): 500 mL

## 2019-02-14 MED ORDER — ONDANSETRON HCL 4 MG/2ML IJ SOLN: 4.0000 mg | Freq: Four times a day (QID) | INTRAMUSCULAR | Status: DC | PRN

## 2019-02-14 MED ORDER — HYDRALAZINE HCL 20 MG/ML IJ SOLN: 5.0000 mg | INTRAMUSCULAR | Status: DC | PRN

## 2019-02-14 MED ORDER — HEPARIN SODIUM (PORCINE) 1000 UNIT/ML IJ SOLN
INTRAMUSCULAR | Status: AC
  Filled 2019-02-14: qty 1

## 2019-02-14 MED ORDER — IODIXANOL 320 MG/ML IV SOLN
INTRAVENOUS | Status: DC | PRN
  Administered 2019-02-14: 150 mL via INTRA_ARTERIAL

## 2019-02-14 MED ORDER — HEPARIN SODIUM (PORCINE) 1000 UNIT/ML IJ SOLN
INTRAMUSCULAR | Status: DC | PRN
  Administered 2019-02-14: 8000 [IU] via INTRAVENOUS

## 2019-02-14 MED ORDER — SODIUM CHLORIDE 0.9% FLUSH: 3.0000 mL | Freq: Two times a day (BID) | INTRAVENOUS | Status: DC

## 2019-02-14 MED ORDER — ACETAMINOPHEN 325 MG PO TABS
650.0000 mg | ORAL_TABLET | ORAL | Status: DC | PRN
  Administered 2019-02-14: 650 mg via ORAL
  Filled 2019-02-14: qty 2

## 2019-02-14 MED ORDER — LABETALOL HCL 5 MG/ML IV SOLN: 10.0000 mg | INTRAVENOUS | Status: DC | PRN

## 2019-02-14 MED ORDER — MORPHINE SULFATE (PF) 10 MG/ML IV SOLN: 2.0000 mg | INTRAVENOUS | Status: DC | PRN

## 2019-02-14 SURGICAL SUPPLY — 21 items
BAG SNAP BAND KOVER 36X36 (MISCELLANEOUS) ×1 IMPLANT
CATH NAVICROSS ST .035X135CM (MICROCATHETER) ×1 IMPLANT
CATH OMNI FLUSH 5F 65CM (CATHETERS) ×1 IMPLANT
CATH QUICKCROSS SUPP .035X90CM (MICROCATHETER) ×1 IMPLANT
CATH SOFT-VU 4F 65 STRAIGHT (CATHETERS) IMPLANT
CATH SOFT-VU STRAIGHT 4F 65CM (CATHETERS) ×2
CLOSURE MYNX CONTROL 6F/7F (Vascular Products) ×1 IMPLANT
COVER DOME SNAP 22 D (MISCELLANEOUS) ×1 IMPLANT
DEVICE TORQUE .025-.038 (MISCELLANEOUS) ×2 IMPLANT
GLIDEWIRE ADV .035X260CM (WIRE) ×1 IMPLANT
GUIDEWIRE ANGLED .035X260CM (WIRE) ×1 IMPLANT
KIT MICROPUNCTURE NIT STIFF (SHEATH) ×1 IMPLANT
KIT PV (KITS) ×2 IMPLANT
SHEATH FLEXOR ANSEL 1 7F 45CM (SHEATH) ×1 IMPLANT
SHEATH PINNACLE 5F 10CM (SHEATH) ×1 IMPLANT
SHEATH PINNACLE 7F 10CM (SHEATH) ×1 IMPLANT
SHEATH PROBE COVER 6X72 (BAG) ×1 IMPLANT
SYR MEDRAD MARK V 150ML (SYRINGE) ×1 IMPLANT
TRANSDUCER W/STOPCOCK (MISCELLANEOUS) ×2 IMPLANT
TRAY PV CATH (CUSTOM PROCEDURE TRAY) ×2 IMPLANT
WIRE BENTSON .035X145CM (WIRE) ×1 IMPLANT

## 2019-02-14 NOTE — Discharge Instructions (Signed)

## 2019-02-14 NOTE — Progress Notes (Signed)
Arrived to holding area w/staff holding manual pressure to left groin for level 2 hematoma below and medial to site.

## 2019-02-14 NOTE — H&P (Signed)
   History and Physical Update  The patient was interviewed and re-examined.  The patient's previous History and Physical has been reviewed and is unchanged from recent office visit. Plan for aortogram with right lower extremity evaluation for critical limb ischemia.   Kevin Castillo C. Donzetta Matters, MD [facility] Office: [phone] Pager: [phone]  02/14/2019, 7:33 AM

## 2019-02-14 NOTE — Op Note (Signed)
    Patient name: Kevin Maulden Sr. MRN: [id number] DOB: 09-29-1946 Sex: male  02/14/2019 Pre-operative Diagnosis: critical right lower extremity ischemia, esrd Post-operative diagnosis:  Same Surgeon:  Eda Paschal. Donzetta Matters, MD Procedure Performed: 1.  US guided cannulation of left common femoral artery 2.  Aortogram with bilateral lower extremity runoff 3.  Selection of right SFA with right lower extremity angiogram 4.  Minx device closure left common femoral artery   Indications: 73 year old male with history end-stage renal disease now has ulceration on his right toes.  He has an ABI of 0 on that side.  He also has left-sided decreased ABIs although no pain at this time.  He is indicated for angiogram possible intervention.  Findings: Aorta and iliac segments are heavily calcified however there is no flow-limiting stenosis.  The right side which is the site of interest there is an occluded SFA just after the takeoff heavily calcified reconstitutes popliteal artery which appears to be an [place].  There is not reconstitution until very distal posterior tibial artery at the ankle.  On the left side the SFA is heavily calcified with multiple possible 80% stenoses and occludes the below-knee popliteal artery looks to be reconstitution of the posterior tibial artery.  I attempted to cross the occluded SFA I was able to get pictures from a popliteal artery which demonstrated posterior tibial that did not reconstitute until the level of the ankle.  No intervention could be undertaken and patient will need pain control and wound care possible above-knee amputation on the right.  On the left side he will need angiography to possibly improve blood flow to his posterior tibial artery.   Procedure:  The patient was identified in the holding area and taken to room 8.  The patient was then placed supine on the table and prepped and draped in the usual sterile fashion.  A time out was called.  Ultrasound was used to  evaluate the left femoral artery which was noted to be calcified although patent.  The area was anesthetized 1% lidocaine cannulated directly with ultrasound guidance with micropuncture needle followed by wire and sheath.  An image was saved the permanent record.  Bentson wire was placed followed by 5 [place] sheath.  Omni catheter was placed the level of L1 aortogram performed followed by bilateral lower extremity runoff.  With the above findings we crossed the bifurcation with Omni catheter Bentson wire placed a long 7 French sheath to the level of the right common femoral artery patient was fully heparinized.  I then used a Glidewire along with quick cross catheter and Navi cross catheter to cross the level to popliteal artery but I could not intraluminally.  I did perform angiography from there and identified a posterior tibial artery which was very distal.  Patient does not have any revascularization options of his right lower extremity will need consideration of above-knee amputation.  I exchanged for a short 7 French sheath deployed a minx device but unfortunately he had a hematoma there and pressure was held.  He otherwise tolerated procedure without immediate complication.  Contrast:150cc    Lowery Paullin C. Donzetta Matters, MD [facility] Office: [phone] Pager: [phone]

## 2019-02-14 NOTE — Progress Notes (Signed)
Unable to get blood for creatine Rennis Harding called and informed. States they will try while pt is over in cath lab.

## 2019-02-14 NOTE — Progress Notes (Signed)
Additional 50 minutes manual pressure held by Barbaraann Cao. Left groin hematoma resolved. Site level 0. Left PT dopplered. Instructions reviewed w/patient. Bedrest starts at 1000. Vital signs stable

## 2019-02-15 ENCOUNTER — Encounter (HOSPITAL_COMMUNITY): Payer: Self-pay | Admitting: Vascular Surgery

## 2019-02-15 ENCOUNTER — Telehealth: Payer: Self-pay | Admitting: *Deleted

## 2019-02-15 DIAGNOSIS — N2581 Secondary hyperparathyroidism of renal origin: Secondary | ICD-10-CM | POA: Diagnosis not present

## 2019-02-15 DIAGNOSIS — N186 End stage renal disease: Secondary | ICD-10-CM | POA: Diagnosis not present

## 2019-02-15 NOTE — Telephone Encounter (Signed)
Per Dr. Donzetta Matters, this patient needs to have a right AKA based on his aortogram results from yesterday (no options for revascularization). I called the patient and talked to his daughter (pt was at dialysis); gave her my name and phone number as a contact to call when the patient and family has made a decision. I told her that if they wished to make an appt to talk about this further with Dr. Donzetta Matters, she will call us.

## 2019-02-16 DIAGNOSIS — N186 End stage renal disease: Secondary | ICD-10-CM | POA: Diagnosis not present

## 2019-02-16 DIAGNOSIS — Z992 Dependence on renal dialysis: Secondary | ICD-10-CM | POA: Diagnosis not present

## 2019-02-16 DIAGNOSIS — I509 Heart failure, unspecified: Secondary | ICD-10-CM | POA: Diagnosis not present

## 2019-02-17 DIAGNOSIS — I509 Heart failure, unspecified: Secondary | ICD-10-CM | POA: Diagnosis not present

## 2019-02-17 DIAGNOSIS — N186 End stage renal disease: Secondary | ICD-10-CM | POA: Diagnosis not present

## 2019-02-17 DIAGNOSIS — Z992 Dependence on renal dialysis: Secondary | ICD-10-CM | POA: Diagnosis not present

## 2019-02-18 DIAGNOSIS — N2581 Secondary hyperparathyroidism of renal origin: Secondary | ICD-10-CM | POA: Diagnosis not present

## 2019-02-18 DIAGNOSIS — I509 Heart failure, unspecified: Secondary | ICD-10-CM | POA: Diagnosis not present

## 2019-02-18 DIAGNOSIS — N186 End stage renal disease: Secondary | ICD-10-CM | POA: Diagnosis not present

## 2019-02-18 DIAGNOSIS — Z992 Dependence on renal dialysis: Secondary | ICD-10-CM | POA: Diagnosis not present

## 2019-02-18 LAB — POCT I-STAT 4, (NA,K, GLUC, HGB,HCT)
GLUCOSE: 114 mg/dL — AB (ref 70–99)
HCT: 39 % (ref 39.0–52.0)
Hemoglobin: 13.3 g/dL (ref 13.0–17.0)
Potassium: >8.5 mmol/L (ref 3.5–5.1)
Sodium: 129 mmol/L — ABNORMAL LOW (ref 135–145)

## 2019-02-20 DIAGNOSIS — N186 End stage renal disease: Secondary | ICD-10-CM | POA: Diagnosis not present

## 2019-02-20 DIAGNOSIS — Z992 Dependence on renal dialysis: Secondary | ICD-10-CM | POA: Diagnosis not present

## 2019-02-20 DIAGNOSIS — I509 Heart failure, unspecified: Secondary | ICD-10-CM | POA: Diagnosis not present

## 2019-02-21 ENCOUNTER — Ambulatory Visit: Payer: Medicare HMO | Admitting: Podiatry

## 2019-02-21 ENCOUNTER — Encounter: Payer: Self-pay | Admitting: Podiatry

## 2019-02-21 VITALS — BP 131/82 | HR 91

## 2019-02-21 DIAGNOSIS — I998 Other disorder of circulatory system: Secondary | ICD-10-CM

## 2019-02-21 DIAGNOSIS — N186 End stage renal disease: Secondary | ICD-10-CM | POA: Diagnosis not present

## 2019-02-21 DIAGNOSIS — B351 Tinea unguium: Secondary | ICD-10-CM

## 2019-02-21 DIAGNOSIS — E1151 Type 2 diabetes mellitus with diabetic peripheral angiopathy without gangrene: Secondary | ICD-10-CM | POA: Diagnosis not present

## 2019-02-21 DIAGNOSIS — M79674 Pain in right toe(s): Secondary | ICD-10-CM

## 2019-02-21 DIAGNOSIS — I509 Heart failure, unspecified: Secondary | ICD-10-CM | POA: Diagnosis not present

## 2019-02-21 DIAGNOSIS — I70229 Atherosclerosis of native arteries of extremities with rest pain, unspecified extremity: Secondary | ICD-10-CM

## 2019-02-21 DIAGNOSIS — I96 Gangrene, not elsewhere classified: Secondary | ICD-10-CM

## 2019-02-21 DIAGNOSIS — M79675 Pain in left toe(s): Secondary | ICD-10-CM | POA: Diagnosis not present

## 2019-02-21 DIAGNOSIS — Z992 Dependence on renal dialysis: Secondary | ICD-10-CM | POA: Diagnosis not present

## 2019-02-21 NOTE — Patient Instructions (Addendum)
Gangrene Gangrene is a medical condition that is caused by a lack of blood supply to an area of your body. Oxygen travels through blood, so less blood means less oxygen. Without oxygen, body tissue will start to die. Gangrene can affect many parts of the body. It is most common on the skin (external gangrene) and on your arms and legs, but it can also affect internal body parts (internal gangrene). Gangrene requires emergency medical treatment. What are the causes? Any condition that cuts off blood supply can cause gangrene. Common causes include:  Injuries.  Blood vessel diseases.  Diabetes.  Infections. What increases the risk? You may be at increased risk for gangrene if you have recently had surgery, or if you have:  A serious injury.  Diabetes.  A weak disease-fighting system (immune system).  Narrowing of your arteries due to plaque buildup (arteriosclerosis).  An immune system disease that causes your arteries to tighten (Raynaud's disease).  A history of IV drug use. What are the signs or symptoms? Symptoms depend on which part of your body is affected. If you have external gangrene, you may have:  Severe pain, followed by loss of feeling.  Redness and swelling.  Crackling sounds when you press on a swollen area of skin.  A wound with bad-smelling drainage.  Changes in the color of your skin. It may turn red, blue, black, or white.  Fever and chills. If you have internal gangrene, you may have:  Fever and chills.  Confusion.  Dizziness.  Severe pain.  Rapid heartbeat and breathing.  Loss of appetite.  Nausea or vomiting. How is this diagnosed? This condition may be diagnosed based on:  Your symptoms and medical history.  A physical exam.  X-rays of your blood vessels after injecting dye into your blood vessels (arteriogram).  Blood tests to check for infection.  Imaging tests such as a CT scan or MRI.  Testing a sample of wound drainage for  bacteria (culture).  Testing a tissue sample for cell death (biopsy).  Surgery to look for gangrene inside your body. How is this treated? Treatment for gangrene depends on the cause and the area of your body that is affected. Gangrene is usually treated in the hospital. It is very important to start treatment early, before gangrene spreads. Treatment may include:  High doses of IV antibiotic medicine, for gangrene caused by infection.  Surgery. Surgical options may include: ? Debridement. This is surgery to remove dead tissue. You may need to have debridement several times. ? Bypass or angioplasty. These surgeries improve blood flow to an affected area. ? Amputation. This is surgery to remove a body part. This may be necessary in very severe cases.  Oxygen therapy. This involves treatment in a chamber designed to provide high levels of oxygen (hyperbaric oxygen therapy). It can improve the oxygen supply to an affected area.  Supportive care to keep the rest of your body healthy. This may include: ? IV fluids and nutrients. ? Pain medicines. ? Blood thinners to prevent blood clots. Follow these instructions at home: Skin and wound care  Clean any cuts or scratches with a germ-killing (antiseptic) solution. Your health care provider may recommend certain over-the-counter antiseptics.  Cover any open wounds with a clean bandage. Change the bandage as often as needed to keep the wound clean. Make sure to wash your hands before touching your bandage.  Check wounds every day for signs of infection, such as: ? Redness, swelling, or pain. ? Fluid or  blood. ? Warmth. ? Pus or a bad smell.  If you have diabetes or another blood vessel disease, check your feet every day for signs of injury or infection. You may be at higher risk for gangrene. Lifestyle  Do not use any products that contain nicotine or tobacco, such as cigarettes and e-cigarettes. These can delay wound healing. If you need  help quitting, ask your health care provider.  Limit alcohol intake to no more than 1 drink a day for women (no drinks if you are pregnant) and 2 drinks a day for men. One drink equals 12 oz of beer, 5 oz of wine, or 1 oz of hard liquor.  Eat a healthy diet and maintain a healthy weight.  Get some exercise on most days of the week. Ask your health care provider what forms of exercise may be best for you. General instructions  Keep all follow-up visits as told by your health care provider. This is important. Medicines  Take over-the-counter and prescription medicines only as told by your health care provider.  If you were prescribed an antibiotic medicine, take it as told by your health care provider. Do not stop taking the antibiotic even if you start to feel better.  If you are taking blood thinners: ? Talk with your health care provider before you take any medicines that contain aspirin or NSAIDs. These medicines increase your risk for dangerous bleeding. ? Take your medicine exactly as told, at the same time every day. ? Avoid activities that could cause injury or bruising, and follow instructions about how to prevent falls. ? Wear a medical alert bracelet or carry a card that lists what medicines you take. Contact a health care provider if:  You have a wound or sore that is not healing. Get help right away if:  You have a wound or sore that shows signs of infection.  An area of your skin turns white, red, blue, or black.  You have rapidly worsening pain at the site of a skin infection or wound.  You experience numbness at the site of a skin infection or wound.  You have unexplained: ? Fever. ? Chills. ? Confusion. ? Fainting. Summary  Gangrene is a medical condition that is caused by a lack of blood supply to an area of your body.  It is most common on the skin (external gangrene), but it can also affect internal body parts (internal gangrene).  Injuries and blood  vessel diseases are common causes of gangrene.  Gangrene requires emergency medical treatment. Treatment may include hospitalization, antibiotics, or surgery. This information is not intended to replace advice given to you by your health care provider. Make sure you discuss any questions you have with your health care provider. Document Released: 10/06/2004 Document Revised: 09/12/2017 Document Reviewed: 09/12/2017 [employer] Interactive Patient Education  2019 [employer]  Diabetes Mellitus and [place] care is an important part of your health, especially when you have diabetes. Diabetes may cause you to have problems because of poor blood flow (circulation) to your feet and legs, which can cause your skin to:  Become thinner and drier.  Break more easily.  Heal more slowly.  Peel and crack. You may also have nerve damage (neuropathy) in your legs and feet, causing decreased feeling in them. This means that you may not notice minor injuries to your feet that could lead to more serious problems. Noticing and addressing any potential problems early is the best way to prevent future foot  problems. How to care for your feet Foot hygiene  Wash your feet daily with warm water and mild soap. Do not use hot water. Then, pat your feet and the areas between your toes until they are completely dry. Do not soak your feet as this can dry your skin.  Trim your toenails straight across. Do not dig under them or around the cuticle. File the edges of your nails with an emery board or nail file.  Apply a moisturizing lotion or petroleum jelly to the skin on your feet and to dry, brittle toenails. Use lotion that does not contain alcohol and is unscented. Do not apply lotion between your toes. Shoes and socks  Wear clean socks or stockings every day. Make sure they are not too tight. Do not wear knee-high stockings since they may decrease blood flow to your legs.  Wear shoes that fit properly and  have enough cushioning. Always look in your shoes before you put them on to be sure there are no objects inside.  To break in new shoes, wear them for just a few hours a day. This prevents injuries on your feet. Wounds, scrapes, corns, and calluses  Check your feet daily for blisters, cuts, bruises, sores, and redness. If you cannot see the bottom of your feet, use a mirror or ask someone for help.  Do not cut corns or calluses or try to remove them with medicine.  If you find a minor scrape, cut, or break in the skin on your feet, keep it and the skin around it clean and dry. You may clean these areas with mild soap and water. Do not clean the area with peroxide, alcohol, or iodine.  If you have a wound, scrape, corn, or callus on your foot, look at it several times a day to make sure it is healing and not infected. Check for: ? Redness, swelling, or pain. ? Fluid or blood. ? Warmth. ? Pus or a bad smell. General instructions  Do not cross your legs. This may decrease blood flow to your feet.  Do not use heating pads or hot water bottles on your feet. They may burn your skin. If you have lost feeling in your feet or legs, you may not know this is happening until it is too late.  Protect your feet from hot and cold by wearing shoes, such as at the beach or on hot pavement.  Schedule a complete foot exam at least once a year (annually) or more often if you have foot problems. If you have foot problems, report any cuts, sores, or bruises to your health care provider immediately. Contact a health care provider if:  You have a medical condition that increases your risk of infection and you have any cuts, sores, or bruises on your feet.  You have an injury that is not healing.  You have redness on your legs or feet.  You feel burning or tingling in your legs or feet.  You have pain or cramps in your legs and feet.  Your legs or feet are numb.  Your feet always feel cold.  You have  pain around a toenail. Get help right away if:  You have a wound, scrape, corn, or callus on your foot and: ? You have pain, swelling, or redness that gets worse. ? You have fluid or blood coming from the wound, scrape, corn, or callus. ? Your wound, scrape, corn, or callus feels warm to the touch. ? You have pus  or a bad smell coming from the wound, scrape, corn, or callus. ? You have a fever. ? You have a red line going up your leg. Summary  Check your feet every day for cuts, sores, red spots, swelling, and blisters.  Moisturize feet and legs daily.  Wear shoes that fit properly and have enough cushioning.  If you have foot problems, report any cuts, sores, or bruises to your health care provider immediately.  Schedule a complete foot exam at least once a year (annually) or more often if you have foot problems. This information is not intended to replace advice given to you by your health care provider. Make sure you discuss any questions you have with your health care provider. Document Released: 12/02/2000 Document Revised: 01/17/2018 Document Reviewed: 01/06/2017 [employer] Interactive Patient Education  2019 [employer]  Onychomycosis/Fungal Toenails  WHAT IS IT? An infection that lies within the keratin of your nail plate that is caused by a fungus.  WHY ME? Fungal infections affect all ages, sexes, races, and creeds.  There may be many factors that predispose you to a fungal infection such as age, coexisting medical conditions such as diabetes, or an autoimmune disease; stress, medications, fatigue, genetics, etc.  Bottom line: fungus thrives in a warm, moist environment and your shoes offer such a location.  IS IT CONTAGIOUS? Theoretically, yes.  You do not want to share shoes, nail clippers or files with someone who has fungal toenails.  Walking around barefoot in the same room or sleeping in the same bed is unlikely to transfer the organism.  It is important to realize,  however, that fungus can spread easily from one nail to the next on the same foot.  HOW DO WE TREAT THIS?  There are several ways to treat this condition.  Treatment may depend on many factors such as age, medications, pregnancy, liver and kidney conditions, etc.  It is best to ask your doctor which options are available to you.  1. No treatment.   Unlike many other medical concerns, you can live with this condition.  However for many people this can be a painful condition and may lead to ingrown toenails or a bacterial infection.  It is recommended that you keep the nails cut short to help reduce the amount of fungal nail. 2. Topical treatment.  These range from herbal remedies to prescription strength nail lacquers.  About 40-50% effective, topicals require twice daily application for approximately 9 to 12 months or until an entirely new nail has grown out.  The most effective topicals are medical grade medications available through physicians offices. 3. Oral antifungal medications.  With an 80-90% cure rate, the most common oral medication requires 3 to 4 months of therapy and stays in your system for a year as the new nail grows out.  Oral antifungal medications do require blood work to make sure it is a safe drug for you.  A liver function panel will be performed prior to starting the medication and after the first month of treatment.  It is important to have the blood work performed to avoid any harmful side effects.  In general, this medication safe but blood work is required. 4. Laser Therapy.  This treatment is performed by applying a specialized laser to the affected nail plate.  This therapy is noninvasive, fast, and non-painful.  It is not covered by insurance and is therefore, out of pocket.  The results have been very good with a 80-95% cure rate.  [facility] is the only practice in the area to offer this therapy. 5. Permanent Nail Avulsion.  Removing the entire nail so that a new  nail will not grow back.

## 2019-02-22 DIAGNOSIS — I509 Heart failure, unspecified: Secondary | ICD-10-CM | POA: Diagnosis not present

## 2019-02-22 DIAGNOSIS — N186 End stage renal disease: Secondary | ICD-10-CM | POA: Diagnosis not present

## 2019-02-22 DIAGNOSIS — Z992 Dependence on renal dialysis: Secondary | ICD-10-CM | POA: Diagnosis not present

## 2019-02-22 DIAGNOSIS — N2581 Secondary hyperparathyroidism of renal origin: Secondary | ICD-10-CM | POA: Diagnosis not present

## 2019-02-22 NOTE — Progress Notes (Signed)
Subjective: Kevin Otterness Sr. presents today for second opinion regarding recommended right lower extremity amputation.  Patient states he has been diabetic for 40 years and has been on dialysis since August 2019.  He relates a 60-year heavy tobacco and alcohol use.  He states that he does still smoke at least 1 cigarette/day.  He has not had alcohol since August 2019.  Patient relates of 5-week history of severe right foot pain.  He was evaluated in the emergency room and eventually evaluated by Dr. Servando Snare for critical limb ischemia of the right lower extremity.  Kevin Castillo had angiogram performed with subsequent attempt at revascularization, but unfortunately he has no revascularization options.  Dr. Donzetta Matters has recommended above-knee amputation.    Past Medical History:  Diagnosis Date  . Acute left systolic heart failure ([place]) 01/06/2016  . Adenoma of left adrenal gland 06/16/2014   CT Abdomen (06/04/2014): Incidental, 20 mm, 8.33 HU suggesting benign adenoma   . Alcohol abuse 02/15/2007   Reports consuming 1 pint of gin on weekends, does not drink every day. Denies history of seizures, blackouts, or tremors.    . Allergic rhinitis 04/09/2014  . Anemia 11/12/2013   Unclear cause as of yet (? EtOH abuse)   . Anemia of chronic kidney failure, stage 4 (severe) ([place]) 07/28/2017  . Benign prostatic hypertrophy 06/02/2014  . Chronic kidney disease, stage 3 ([place]) 11/12/2013  . Chronic systolic heart failure ([place]) 01/15/2016   Echo (01/07/2016): LVEF 35%  . COPD (chronic obstructive pulmonary disease) ([place])   . Coronary artery disease 02/15/2007   Non-STEMI 07/2005, s/p CABG x 4 (08/01/2005): LIMA to LAD, SVG to OM, RCA, PCA   . DVT (deep venous thrombosis) ([place]) 2006   RLE "when I had OHS"  . Erectile dysfunction associated with type 2 diabetes mellitus ([place]) 02/15/2007  . Essential hypertension 02/15/2007  . Hyperlipidemia 02/15/2007  . Major depression, single episode 11/02/2015  . Obesity (BMI  30.0-34.9) 02/15/2007  . Peripheral vascular occlusive disease ([place]) 12/30/2013   ABI (01/03/2014): Right 0.64, Left 0.64   . Pneumonia X 1  . Secondary hyperparathyroidism of renal origin ([place]) 07/28/2017  . Sigmoid diverticulosis 01/15/2016   Minimal per colonoscopy 01/11/2016  . Tobacco abuse 02/15/2007  . Tubular adenoma of colon 01/11/2016   Two 6 mm semi-pedunculated tubular adenomas excised endoscopically 01/11/2016.  Repeat colonoscopy due 12/2020.  . Type 2 diabetes mellitus with mild nonproliferative diabetic retinopathy with macular edema, bilateral ([place]) 10/28/2016  . Type 2 diabetes mellitus with stage 3 chronic kidney disease ([place]) 02/15/2007  . Venous thromboembolism 11/11/2013   LE Dopplers (01/2011): RLE DVT. CTA with mild subacute to chronic right-sided pulmonary emboli.  Patient has elected to continue anticoagulation.      Patient Active Problem List   Diagnosis Date Noted  . Anemia 09/25/2018  . Acute on chronic left systolic heart failure ([place]) 07/18/2018  . Rib pain on left side 07/12/2018  . Anemia of chronic kidney failure, stage 5 ([place]) 07/28/2017  . Secondary hyperparathyroidism of renal origin ([place]) 07/28/2017  . Type 2 diabetes mellitus with mild nonproliferative diabetic retinopathy with macular edema, bilateral ([place]) 10/28/2016  . Chronic systolic heart failure ([place]) 01/15/2016  . Sigmoid diverticulosis 01/15/2016  . Tubular adenoma of colon 01/11/2016  . Benign prostatic hyperplasia 06/02/2014  . Allergic rhinitis 04/09/2014  . Peripheral vascular occlusive disease ([place]) 12/30/2013  . Chronic kidney disease, stage 5 ([place]) 11/12/2013  . Healthcare maintenance 11/12/2013  . History of venous thromboembolism 11/11/2013  .  Type 2 diabetes mellitus with stage 5 chronic kidney disease ([place]) 02/15/2007  . Hyperlipidemia 02/15/2007  . Obesity (BMI 30.0-34.9) 02/15/2007  . Erectile dysfunction associated with type 2 diabetes mellitus ([place]) 02/15/2007  . Alcohol abuse  02/15/2007  . Tobacco abuse 02/15/2007  . Essential hypertension 02/15/2007  . Coronary artery disease involving autologous vein coronary bypass graft with angina pectoris ([place]) 02/15/2007     Past Surgical History:  Procedure Laterality Date  . ABDOMINAL AORTOGRAM W/LOWER EXTREMITY N/A 02/14/2019   Procedure: ABDOMINAL AORTOGRAM W/LOWER EXTREMITY;  Surgeon: Waynetta Sandy, MD;  Location: [facility];  Service: Cardiovascular;  Laterality: N/A;  . AV FISTULA PLACEMENT  07/23/2018   Procedure: RIGHT UPPER ARM ARTERIOVENOUS (AV) BRACHIOCEPHALIC FISTULA CREATION;  Surgeon: Conrad [place], MD;  Location: [facility];  Service: Vascular;;  . CARDIAC CATHETERIZATION  07/2005  . CATARACT EXTRACTION W/ INTRAOCULAR LENS  IMPLANT, BILATERAL Bilateral 04/2014   Left 04/30/2014, Right 05/07/2014  . COLONOSCOPY WITH PROPOFOL N/A 01/11/2016   Procedure: COLONOSCOPY WITH PROPOFOL;  Surgeon: Ronald Lobo, MD;  Location: [facility];  Service: Endoscopy;  Laterality: N/A;  Needs MAC.  Marland Kitchen CORONARY ARTERY BYPASS GRAFT  08/01/2005   x 4, LIMA to LAD, SVG to OM, RCA, PCA  . ESOPHAGOGASTRODUODENOSCOPY N/A 01/08/2016   Procedure: ESOPHAGOGASTRODUODENOSCOPY (EGD);  Surgeon: Wilford Corner, MD;  Location[facility];  Service: Endoscopy;  Laterality: N/A;  . GIVENS CAPSULE STUDY N/A 01/12/2016   Procedure: GIVENS CAPSULE STUDY;  Surgeon: Ronald Lobo, MD;  Location: [facility];  Service: Endoscopy;  Laterality: N/A;  . INSERTION OF DIALYSIS CATHETER Right 07/23/2018   Procedure: INSERTION OF  DIALYSIS CATHETER - RIGHT INTERNAL JUGULAR PLACEMENT;  Surgeon: Conrad Pole Ojea, MD;  Location: [facility];  Service: Vascular;  Laterality: Right;      Current Outpatient Medications:  .  allopurinol (ZYLOPRIM) 100 MG tablet, Take 1 tablet (100 mg total) by mouth daily., Disp: 30 tablet, Rfl: 3 .  atorvastatin (LIPITOR) 20 MG tablet, Take 1 tablet (20 mg total) by mouth daily., Disp: 90 tablet, Rfl: 3 .  B  Complex-C-Zn-Folic Acid (DIALYVITE/ZINC) TABS, Take 1 tablet by mouth 2 (two) times daily. , Disp: , Rfl: 2 .  Darbepoetin Alfa (ARANESP) 100 MCG/0.5ML SOSY injection, Inject 0.5 mLs (100 mcg total) into the vein every Thursday with hemodialysis., Disp: , Rfl:  .  doxycycline (VIBRAMYCIN) 100 MG capsule, Take 1 capsule (100 mg total) by mouth 2 (two) times daily. One po bid x 7 days (Patient not taking: Reported on 02/08/2019), Disp: 14 capsule, Rfl: 0 .  ferric gluconate 250 mg in sodium chloride 0.9 % 100 mL, Inject 250 mg into the vein every Monday, Wednesday, and Friday with hemodialysis., Disp: , Rfl:  .  finasteride (PROSCAR) 5 MG tablet, Take 1 tablet (5 mg total) by mouth daily., Disp: 30 tablet, Rfl: 11 .  fluticasone (FLONASE) 50 MCG/ACT nasal spray, Place 1 spray into both nostrils as needed for allergies or rhinitis., Disp: , Rfl:  .  furosemide (LASIX) 80 MG tablet, , Disp: , Rfl:  .  heparin 1000 unit/mL SOLN injection, 1 mL (1,000 Units total) by Dialysis route as needed (in dialysis)., Disp: , Rfl:  .  labetalol (NORMODYNE) 200 MG tablet, Take 200 mg by mouth 2 (two) times daily., Disp: , Rfl:  .  lidocaine-prilocaine (EMLA) cream, Apply 1 application topically as needed (topical anesthesia for hemodialysis if Gebauers and Lidocaine injection are ineffective.)., Disp: , Rfl:  .  multivitamin (  RENA-VIT) TABS tablet, , Disp: , Rfl:  .  pentafluoroprop-tetrafluoroeth (GEBAUERS) AERO, Apply 1 application topically as needed (topical anesthesia for hemodialysis)., Disp: , Rfl: 0 .  sodium chloride 0.9 % infusion, Inject 250 mLs into the vein as needed (for IV line care(Saline / Heparin Lock))., Disp: , Rfl: 0 .  sodium chloride 0.9 % infusion, Inject 100 mLs into the vein as needed (symptomatic hypotension or decrease in SBP < 90 mmHg)., Disp: , Rfl: 0 .  sodium chloride 0.9 % infusion, Inject 100 mLs into the vein as needed (severe cramping)., Disp: , Rfl: 0 .  traMADol (ULTRAM) 50 MG  tablet, Take 1 tablet (50 mg total) by mouth every 6 (six) hours as needed., Disp: 15 tablet, Rfl: 0   Allergies  Allergen Reactions  . Nicotine Transdermal System [Nicotine] Other (See Comments)    Vivid dreams     Social History   Occupational History  . 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 Smoking status: Current Every Day Smoker    Packs/day: 0.50    Years: 60.00    Pack years: 30.00    Types: Cigarettes  . Smokeless tobacco: Former Systems developer    Types: Snuff, Chew  Substance and Sexual Activity  . Alcohol use: Yes    Alcohol/week: 5.0 standard drinks    Types: 5 Shots of liquor per week    Comment: 07/18/2018 Drinks 1/2 pint of gin over the weekend but does not drink everyday. Denies hx of blackouts, seizures, tremors.  . Drug use: No  . Sexual activity: Not Currently    Birth control/protection: None     Family History  Problem Relation Age of Onset  . Heart disease Father   . COPD Father   . Diabetes Mellitus II Mother   . Healthy Sister   . Healthy Brother   . Healthy Daughter   . Healthy Son   . Healthy Sister   . Healthy Sister   . Healthy Sister   . Healthy Sister   . Healthy Daughter   . Healthy Son   . Healthy Son   . Healthy Son   . Healthy Son   . Healthy Son   . Healthy Son      Immunization History  Administered Date(s) Administered  . Influenza,inj,Quad PF,6+ Mos 11/11/2013, 10/17/2014, 08/19/2015, 10/28/2016, 10/27/2017  . Pneumococcal Conjugate-13 04/10/2015  . Pneumococcal Polysaccharide-23 08/19/1997, 12/02/2013  . Td 08/19/1997  . Tdap 12/02/2013     Review of systems: Positive Findings in bold print.  Constitutional:  chills, fatigue, fever, sweats, weight change Communication: Optometrist, sign Ecologist, hand writing, iPad/Android device Head: headaches, head injury Eyes: changes in vision, eye pain, glaucoma, cataracts, macular degeneration, diplopia,  glare,  light sensitivity, eyeglasses or contacts, blindness Ears nose mouth throat: Hard of hearing, ringing in ears, deaf, sign language,  vertigo,   nosebleeds,  allergic rhinitis,  cold sores, snoring, swollen glands Cardiovascular: HTN, edema, arrhythmia, pacemaker in place, defibrillator in place,  chest pain/tightness, chronic anticoagulation, blood clot, heart failure Peripheral Vascular: leg cramps, varicose veins, blood clots, lymphedema Respiratory:  difficulty breathing, denies congestion, SOB, wheezing, cough, emphysema Gastrointestinal: change in appetite or weight, abdominal pain, constipation, diarrhea, nausea, vomiting, vomiting blood, change in bowel habits, abdominal pain, jaundice, rectal bleeding, hemorrhoids, Genitourinary:  nocturia,  pain on urination,  blood in urine, Foley catheter, urinary urgency, ESRD, hemodialysis Musculoskeletal: uses mobility aid,  cramping,  stiff joints, painful joints, decreased joint motion, fractures, OA, gout Skin: +changes in toenails, color change, dryness, itching, mole changes,  rash  Neurological: headaches, numbness in feet, paresthesias in feet, burning in feet, fainting,  seizures, change in speech. denies headaches, memory problems/poor historian, cerebral palsy, weakness, paralysis Endocrine: diabetes, hypothyroidism, hyperthyroidism,  goiter, dry mouth, flushing, heat intolerance,  cold intolerance,  excessive thirst, denies polyuria,  nocturia Hematological:  easy bleeding, excessive bleeding, easy bruising, enlarged lymph nodes, on long term blood thinner, history of past transusions Allergy/immunological:  hives, eczema, frequent infections, multiple drug allergies, seasonal allergies, transplant recipient Psychiatric:  anxiety, depression, mood disorder, suicidal ideations, hallucinations   Objective: Vitals:   02/21/19 0832  BP: 131/82  Pulse: 91   Vascular Examination: Capillary refill time <3 seconds x 10  digits.  Dorsalis pedis and posterior tibial pulses diminished b/l.  No digital hair x 10 digits.  Skin temperature warm to cool bilaterally.  Dermatological Examination: Skin noted to be atrophic bilaterally.  Toenails 1 through 5 bilaterally noted to be elongated, discolored, dystrophic, with subungual debris.  Interdigital ulceration noted lateral aspect right fourth digit.  Gangrenous odor appreciated.  Digits 4 and 5 noted to be dusky in appearance and cool.  Musculoskeletal: Muscle strength 5/5 to all LE muscle groups  Neurological: Sensation intact with 10 gram monofilament.  Vibratory sensation intact.  Assessment: Critical limb ischemia right lower extremity Onychomycosis toenails 1 through 5 bilaterally  Plan: 1. Discussed patient's case in depth with him and his wife.  Dr. Hardie Pulley also evaluated patient during this visit.  Unfortunately, his options remain the same after our evaluation.  We are in agreement with Dr. Donzetta Matters. 2. I did debride his toenails on the left foot only without iatrogenic bleeding. 3. Advised patient and wife to contact Dr.Cain's office for further preoperative instructions. 4. Patient to report any pedal injuries to medical professional immediately. 5. Follow up 3 months for routine foot care. 6. Patient/POA to call should there be a concern in the interim.

## 2019-02-23 DIAGNOSIS — N186 End stage renal disease: Secondary | ICD-10-CM | POA: Diagnosis not present

## 2019-02-23 DIAGNOSIS — Z992 Dependence on renal dialysis: Secondary | ICD-10-CM | POA: Diagnosis not present

## 2019-02-23 DIAGNOSIS — I509 Heart failure, unspecified: Secondary | ICD-10-CM | POA: Diagnosis not present

## 2019-02-24 DIAGNOSIS — N186 End stage renal disease: Secondary | ICD-10-CM | POA: Diagnosis not present

## 2019-02-24 DIAGNOSIS — I509 Heart failure, unspecified: Secondary | ICD-10-CM | POA: Diagnosis not present

## 2019-02-24 DIAGNOSIS — Z992 Dependence on renal dialysis: Secondary | ICD-10-CM | POA: Diagnosis not present

## 2019-02-25 DIAGNOSIS — Z992 Dependence on renal dialysis: Secondary | ICD-10-CM | POA: Diagnosis not present

## 2019-02-25 DIAGNOSIS — N2581 Secondary hyperparathyroidism of renal origin: Secondary | ICD-10-CM | POA: Diagnosis not present

## 2019-02-25 DIAGNOSIS — I509 Heart failure, unspecified: Secondary | ICD-10-CM | POA: Diagnosis not present

## 2019-02-25 DIAGNOSIS — N186 End stage renal disease: Secondary | ICD-10-CM | POA: Diagnosis not present

## 2019-02-26 DIAGNOSIS — Z951 Presence of aortocoronary bypass graft: Secondary | ICD-10-CM | POA: Diagnosis not present

## 2019-02-26 DIAGNOSIS — I998 Other disorder of circulatory system: Secondary | ICD-10-CM | POA: Diagnosis not present

## 2019-02-26 DIAGNOSIS — I70223 Atherosclerosis of native arteries of extremities with rest pain, bilateral legs: Secondary | ICD-10-CM | POA: Diagnosis not present

## 2019-02-26 DIAGNOSIS — E1151 Type 2 diabetes mellitus with diabetic peripheral angiopathy without gangrene: Secondary | ICD-10-CM | POA: Diagnosis not present

## 2019-02-26 DIAGNOSIS — I120 Hypertensive chronic kidney disease with stage 5 chronic kidney disease or end stage renal disease: Secondary | ICD-10-CM | POA: Diagnosis not present

## 2019-02-26 DIAGNOSIS — Z992 Dependence on renal dialysis: Secondary | ICD-10-CM | POA: Diagnosis not present

## 2019-02-26 DIAGNOSIS — F17200 Nicotine dependence, unspecified, uncomplicated: Secondary | ICD-10-CM | POA: Diagnosis not present

## 2019-02-26 DIAGNOSIS — I252 Old myocardial infarction: Secondary | ICD-10-CM | POA: Diagnosis not present

## 2019-02-26 DIAGNOSIS — N189 Chronic kidney disease, unspecified: Secondary | ICD-10-CM | POA: Diagnosis not present

## 2019-02-26 DIAGNOSIS — I509 Heart failure, unspecified: Secondary | ICD-10-CM | POA: Diagnosis not present

## 2019-02-26 DIAGNOSIS — L97509 Non-pressure chronic ulcer of other part of unspecified foot with unspecified severity: Secondary | ICD-10-CM | POA: Diagnosis not present

## 2019-02-26 DIAGNOSIS — I7389 Other specified peripheral vascular diseases: Secondary | ICD-10-CM | POA: Diagnosis not present

## 2019-02-26 DIAGNOSIS — N400 Enlarged prostate without lower urinary tract symptoms: Secondary | ICD-10-CM | POA: Diagnosis not present

## 2019-02-26 DIAGNOSIS — E1122 Type 2 diabetes mellitus with diabetic chronic kidney disease: Secondary | ICD-10-CM | POA: Diagnosis not present

## 2019-02-26 DIAGNOSIS — N186 End stage renal disease: Secondary | ICD-10-CM | POA: Diagnosis not present

## 2019-02-26 DIAGNOSIS — Z01818 Encounter for other preprocedural examination: Secondary | ICD-10-CM | POA: Diagnosis not present

## 2019-02-26 DIAGNOSIS — I2510 Atherosclerotic heart disease of native coronary artery without angina pectoris: Secondary | ICD-10-CM | POA: Diagnosis not present

## 2019-02-26 DIAGNOSIS — I70221 Atherosclerosis of native arteries of extremities with rest pain, right leg: Secondary | ICD-10-CM | POA: Diagnosis not present

## 2019-02-26 DIAGNOSIS — L97519 Non-pressure chronic ulcer of other part of right foot with unspecified severity: Secondary | ICD-10-CM | POA: Diagnosis not present

## 2019-02-26 DIAGNOSIS — D649 Anemia, unspecified: Secondary | ICD-10-CM | POA: Diagnosis not present

## 2019-02-26 DIAGNOSIS — E119 Type 2 diabetes mellitus without complications: Secondary | ICD-10-CM | POA: Diagnosis not present

## 2019-02-26 DIAGNOSIS — I739 Peripheral vascular disease, unspecified: Secondary | ICD-10-CM | POA: Diagnosis not present

## 2019-02-26 DIAGNOSIS — E11621 Type 2 diabetes mellitus with foot ulcer: Secondary | ICD-10-CM | POA: Diagnosis not present

## 2019-02-27 DIAGNOSIS — I509 Heart failure, unspecified: Secondary | ICD-10-CM | POA: Diagnosis not present

## 2019-02-27 DIAGNOSIS — Z992 Dependence on renal dialysis: Secondary | ICD-10-CM | POA: Diagnosis not present

## 2019-02-27 DIAGNOSIS — N186 End stage renal disease: Secondary | ICD-10-CM | POA: Diagnosis not present

## 2019-02-28 DIAGNOSIS — N186 End stage renal disease: Secondary | ICD-10-CM | POA: Diagnosis not present

## 2019-02-28 DIAGNOSIS — I509 Heart failure, unspecified: Secondary | ICD-10-CM | POA: Diagnosis not present

## 2019-02-28 DIAGNOSIS — Z992 Dependence on renal dialysis: Secondary | ICD-10-CM | POA: Diagnosis not present

## 2019-03-01 DIAGNOSIS — I509 Heart failure, unspecified: Secondary | ICD-10-CM | POA: Diagnosis not present

## 2019-03-01 DIAGNOSIS — Z992 Dependence on renal dialysis: Secondary | ICD-10-CM | POA: Diagnosis not present

## 2019-03-01 DIAGNOSIS — N186 End stage renal disease: Secondary | ICD-10-CM | POA: Diagnosis not present

## 2019-03-02 DIAGNOSIS — I509 Heart failure, unspecified: Secondary | ICD-10-CM | POA: Diagnosis not present

## 2019-03-02 DIAGNOSIS — N186 End stage renal disease: Secondary | ICD-10-CM | POA: Diagnosis not present

## 2019-03-02 DIAGNOSIS — Z992 Dependence on renal dialysis: Secondary | ICD-10-CM | POA: Diagnosis not present

## 2019-03-03 DIAGNOSIS — N186 End stage renal disease: Secondary | ICD-10-CM | POA: Diagnosis not present

## 2019-03-03 DIAGNOSIS — Z992 Dependence on renal dialysis: Secondary | ICD-10-CM | POA: Diagnosis not present

## 2019-03-03 DIAGNOSIS — I509 Heart failure, unspecified: Secondary | ICD-10-CM | POA: Diagnosis not present

## 2019-03-04 DIAGNOSIS — N186 End stage renal disease: Secondary | ICD-10-CM | POA: Diagnosis not present

## 2019-03-04 DIAGNOSIS — Z992 Dependence on renal dialysis: Secondary | ICD-10-CM | POA: Diagnosis not present

## 2019-03-04 DIAGNOSIS — I509 Heart failure, unspecified: Secondary | ICD-10-CM | POA: Diagnosis not present

## 2019-03-05 DIAGNOSIS — I509 Heart failure, unspecified: Secondary | ICD-10-CM | POA: Diagnosis not present

## 2019-03-05 DIAGNOSIS — N186 End stage renal disease: Secondary | ICD-10-CM | POA: Diagnosis not present

## 2019-03-05 DIAGNOSIS — Z992 Dependence on renal dialysis: Secondary | ICD-10-CM | POA: Diagnosis not present

## 2019-03-06 DIAGNOSIS — N186 End stage renal disease: Secondary | ICD-10-CM | POA: Diagnosis not present

## 2019-03-06 DIAGNOSIS — Z992 Dependence on renal dialysis: Secondary | ICD-10-CM | POA: Diagnosis not present

## 2019-03-06 DIAGNOSIS — I509 Heart failure, unspecified: Secondary | ICD-10-CM | POA: Diagnosis not present

## 2019-03-07 ENCOUNTER — Emergency Department (HOSPITAL_COMMUNITY): Payer: Medicare HMO

## 2019-03-07 ENCOUNTER — Inpatient Hospital Stay (HOSPITAL_COMMUNITY)
Admission: EM | Admit: 2019-03-07 | Discharge: 2019-03-20 | DRG: 239 | Disposition: E | Payer: Medicare HMO | Attending: Internal Medicine | Admitting: Internal Medicine

## 2019-03-07 ENCOUNTER — Encounter (HOSPITAL_COMMUNITY): Payer: Self-pay

## 2019-03-07 ENCOUNTER — Other Ambulatory Visit: Payer: Self-pay

## 2019-03-07 DIAGNOSIS — Z8249 Family history of ischemic heart disease and other diseases of the circulatory system: Secondary | ICD-10-CM

## 2019-03-07 DIAGNOSIS — J90 Pleural effusion, not elsewhere classified: Secondary | ICD-10-CM | POA: Diagnosis not present

## 2019-03-07 DIAGNOSIS — D62 Acute posthemorrhagic anemia: Secondary | ICD-10-CM | POA: Diagnosis not present

## 2019-03-07 DIAGNOSIS — R630 Anorexia: Secondary | ICD-10-CM | POA: Diagnosis present

## 2019-03-07 DIAGNOSIS — Z992 Dependence on renal dialysis: Secondary | ICD-10-CM | POA: Diagnosis not present

## 2019-03-07 DIAGNOSIS — N186 End stage renal disease: Secondary | ICD-10-CM | POA: Diagnosis present

## 2019-03-07 DIAGNOSIS — N185 Chronic kidney disease, stage 5: Secondary | ICD-10-CM

## 2019-03-07 DIAGNOSIS — I509 Heart failure, unspecified: Secondary | ICD-10-CM | POA: Diagnosis not present

## 2019-03-07 DIAGNOSIS — R740 Nonspecific elevation of levels of transaminase and lactic acid dehydrogenase [LDH]: Secondary | ICD-10-CM | POA: Diagnosis not present

## 2019-03-07 DIAGNOSIS — I70229 Atherosclerosis of native arteries of extremities with rest pain, unspecified extremity: Secondary | ICD-10-CM | POA: Diagnosis present

## 2019-03-07 DIAGNOSIS — E1122 Type 2 diabetes mellitus with diabetic chronic kidney disease: Secondary | ICD-10-CM | POA: Diagnosis present

## 2019-03-07 DIAGNOSIS — D631 Anemia in chronic kidney disease: Secondary | ICD-10-CM | POA: Diagnosis not present

## 2019-03-07 DIAGNOSIS — E1151 Type 2 diabetes mellitus with diabetic peripheral angiopathy without gangrene: Secondary | ICD-10-CM | POA: Diagnosis present

## 2019-03-07 DIAGNOSIS — I5022 Chronic systolic (congestive) heart failure: Secondary | ICD-10-CM | POA: Diagnosis not present

## 2019-03-07 DIAGNOSIS — I70201 Unspecified atherosclerosis of native arteries of extremities, right leg: Secondary | ICD-10-CM | POA: Diagnosis not present

## 2019-03-07 DIAGNOSIS — F1020 Alcohol dependence, uncomplicated: Secondary | ICD-10-CM | POA: Diagnosis present

## 2019-03-07 DIAGNOSIS — E11621 Type 2 diabetes mellitus with foot ulcer: Secondary | ICD-10-CM | POA: Diagnosis present

## 2019-03-07 DIAGNOSIS — I252 Old myocardial infarction: Secondary | ICD-10-CM

## 2019-03-07 DIAGNOSIS — I10 Essential (primary) hypertension: Secondary | ICD-10-CM | POA: Diagnosis not present

## 2019-03-07 DIAGNOSIS — E785 Hyperlipidemia, unspecified: Secondary | ICD-10-CM | POA: Diagnosis present

## 2019-03-07 DIAGNOSIS — F329 Major depressive disorder, single episode, unspecified: Secondary | ICD-10-CM | POA: Diagnosis present

## 2019-03-07 DIAGNOSIS — J449 Chronic obstructive pulmonary disease, unspecified: Secondary | ICD-10-CM | POA: Diagnosis not present

## 2019-03-07 DIAGNOSIS — Z91048 Other nonmedicinal substance allergy status: Secondary | ICD-10-CM | POA: Diagnosis not present

## 2019-03-07 DIAGNOSIS — R062 Wheezing: Secondary | ICD-10-CM | POA: Diagnosis not present

## 2019-03-07 DIAGNOSIS — F17210 Nicotine dependence, cigarettes, uncomplicated: Secondary | ICD-10-CM | POA: Diagnosis present

## 2019-03-07 DIAGNOSIS — K0889 Other specified disorders of teeth and supporting structures: Secondary | ICD-10-CM | POA: Diagnosis not present

## 2019-03-07 DIAGNOSIS — I2510 Atherosclerotic heart disease of native coronary artery without angina pectoris: Secondary | ICD-10-CM | POA: Diagnosis present

## 2019-03-07 DIAGNOSIS — Z89439 Acquired absence of unspecified foot: Secondary | ICD-10-CM | POA: Diagnosis not present

## 2019-03-07 DIAGNOSIS — E113213 Type 2 diabetes mellitus with mild nonproliferative diabetic retinopathy with macular edema, bilateral: Secondary | ICD-10-CM | POA: Diagnosis present

## 2019-03-07 DIAGNOSIS — J9811 Atelectasis: Secondary | ICD-10-CM | POA: Diagnosis not present

## 2019-03-07 DIAGNOSIS — I7789 Other specified disorders of arteries and arterioles: Secondary | ICD-10-CM | POA: Diagnosis present

## 2019-03-07 DIAGNOSIS — Z79899 Other long term (current) drug therapy: Secondary | ICD-10-CM

## 2019-03-07 DIAGNOSIS — R197 Diarrhea, unspecified: Secondary | ICD-10-CM | POA: Diagnosis not present

## 2019-03-07 DIAGNOSIS — E669 Obesity, unspecified: Secondary | ICD-10-CM | POA: Diagnosis present

## 2019-03-07 DIAGNOSIS — M869 Osteomyelitis, unspecified: Secondary | ICD-10-CM | POA: Diagnosis present

## 2019-03-07 DIAGNOSIS — R6889 Other general symptoms and signs: Secondary | ICD-10-CM

## 2019-03-07 DIAGNOSIS — N2581 Secondary hyperparathyroidism of renal origin: Secondary | ICD-10-CM | POA: Diagnosis present

## 2019-03-07 DIAGNOSIS — E1169 Type 2 diabetes mellitus with other specified complication: Secondary | ICD-10-CM | POA: Diagnosis present

## 2019-03-07 DIAGNOSIS — D649 Anemia, unspecified: Secondary | ICD-10-CM | POA: Diagnosis present

## 2019-03-07 DIAGNOSIS — I132 Hypertensive heart and chronic kidney disease with heart failure and with stage 5 chronic kidney disease, or end stage renal disease: Secondary | ICD-10-CM | POA: Diagnosis not present

## 2019-03-07 DIAGNOSIS — Z531 Procedure and treatment not carried out because of patient's decision for reasons of belief and group pressure: Secondary | ICD-10-CM | POA: Diagnosis present

## 2019-03-07 DIAGNOSIS — R7401 Elevation of levels of liver transaminase levels: Secondary | ICD-10-CM | POA: Diagnosis present

## 2019-03-07 DIAGNOSIS — Z89611 Acquired absence of right leg above knee: Secondary | ICD-10-CM | POA: Diagnosis not present

## 2019-03-07 DIAGNOSIS — Z888 Allergy status to other drugs, medicaments and biological substances status: Secondary | ICD-10-CM

## 2019-03-07 DIAGNOSIS — G8929 Other chronic pain: Secondary | ICD-10-CM | POA: Diagnosis not present

## 2019-03-07 DIAGNOSIS — N400 Enlarged prostate without lower urinary tract symptoms: Secondary | ICD-10-CM | POA: Diagnosis present

## 2019-03-07 DIAGNOSIS — Z951 Presence of aortocoronary bypass graft: Secondary | ICD-10-CM

## 2019-03-07 DIAGNOSIS — A419 Sepsis, unspecified organism: Secondary | ICD-10-CM

## 2019-03-07 DIAGNOSIS — L97519 Non-pressure chronic ulcer of other part of right foot with unspecified severity: Secondary | ICD-10-CM | POA: Diagnosis present

## 2019-03-07 DIAGNOSIS — Z86718 Personal history of other venous thrombosis and embolism: Secondary | ICD-10-CM

## 2019-03-07 DIAGNOSIS — S90821A Blister (nonthermal), right foot, initial encounter: Secondary | ICD-10-CM | POA: Diagnosis present

## 2019-03-07 DIAGNOSIS — R05 Cough: Secondary | ICD-10-CM | POA: Diagnosis not present

## 2019-03-07 DIAGNOSIS — R509 Fever, unspecified: Secondary | ICD-10-CM | POA: Diagnosis not present

## 2019-03-07 DIAGNOSIS — I739 Peripheral vascular disease, unspecified: Secondary | ICD-10-CM | POA: Diagnosis present

## 2019-03-07 DIAGNOSIS — Z825 Family history of asthma and other chronic lower respiratory diseases: Secondary | ICD-10-CM

## 2019-03-07 DIAGNOSIS — I998 Other disorder of circulatory system: Secondary | ICD-10-CM | POA: Diagnosis not present

## 2019-03-07 DIAGNOSIS — R011 Cardiac murmur, unspecified: Secondary | ICD-10-CM | POA: Diagnosis not present

## 2019-03-07 DIAGNOSIS — Z03818 Encounter for observation for suspected exposure to other biological agents ruled out: Secondary | ICD-10-CM

## 2019-03-07 DIAGNOSIS — Z833 Family history of diabetes mellitus: Secondary | ICD-10-CM

## 2019-03-07 DIAGNOSIS — I120 Hypertensive chronic kidney disease with stage 5 chronic kidney disease or end stage renal disease: Secondary | ICD-10-CM | POA: Diagnosis not present

## 2019-03-07 DIAGNOSIS — Z87891 Personal history of nicotine dependence: Secondary | ICD-10-CM | POA: Diagnosis not present

## 2019-03-07 DIAGNOSIS — M79671 Pain in right foot: Secondary | ICD-10-CM | POA: Diagnosis not present

## 2019-03-07 DIAGNOSIS — R5082 Postprocedural fever: Secondary | ICD-10-CM | POA: Diagnosis not present

## 2019-03-07 DIAGNOSIS — R748 Abnormal levels of other serum enzymes: Secondary | ICD-10-CM | POA: Diagnosis not present

## 2019-03-07 DIAGNOSIS — I469 Cardiac arrest, cause unspecified: Secondary | ICD-10-CM | POA: Diagnosis not present

## 2019-03-07 DIAGNOSIS — Z20822 Contact with and (suspected) exposure to covid-19: Secondary | ICD-10-CM

## 2019-03-07 LAB — COMPREHENSIVE METABOLIC PANEL
ALT: 73 U/L — ABNORMAL HIGH (ref 0–44)
ANION GAP: 18 — AB (ref 5–15)
AST: 191 U/L — ABNORMAL HIGH (ref 15–41)
Albumin: 2.5 g/dL — ABNORMAL LOW (ref 3.5–5.0)
Alkaline Phosphatase: 68 U/L (ref 38–126)
BUN: 34 mg/dL — ABNORMAL HIGH (ref 8–23)
CALCIUM: 8.9 mg/dL (ref 8.9–10.3)
CO2: 23 mmol/L (ref 22–32)
Chloride: 93 mmol/L — ABNORMAL LOW (ref 98–111)
Creatinine, Ser: 6.8 mg/dL — ABNORMAL HIGH (ref 0.61–1.24)
GFR calc Af Amer: 8 mL/min — ABNORMAL LOW (ref >60)
GFR calc non Af Amer: 7 mL/min — ABNORMAL LOW (ref >60)
Glucose, Bld: 113 mg/dL — ABNORMAL HIGH (ref 70–99)
Potassium: 3.9 mmol/L (ref 3.5–5.1)
Sodium: 134 mmol/L — ABNORMAL LOW (ref 135–145)
TOTAL PROTEIN: 8.3 g/dL — AB (ref 6.5–8.1)
Total Bilirubin: 0.5 mg/dL (ref 0.3–1.2)

## 2019-03-07 LAB — CBC WITH DIFFERENTIAL/PLATELET
Abs Immature Granulocytes: 0.18 10*3/uL — ABNORMAL HIGH (ref 0.00–0.07)
BASOS PCT: 0 %
Basophils Absolute: 0 10*3/uL (ref 0.0–0.1)
EOS ABS: 0 10*3/uL (ref 0.0–0.5)
Eosinophils Relative: 0 %
HCT: 25.4 % — ABNORMAL LOW (ref 39.0–52.0)
Hemoglobin: 7.8 g/dL — ABNORMAL LOW (ref 13.0–17.0)
Immature Granulocytes: 1 %
Lymphocytes Relative: 7 %
Lymphs Abs: 1.4 10*3/uL (ref 0.7–4.0)
MCH: 30.6 pg (ref 26.0–34.0)
MCHC: 30.7 g/dL (ref 30.0–36.0)
MCV: 99.6 fL (ref 80.0–100.0)
Monocytes Absolute: 1.2 10*3/uL — ABNORMAL HIGH (ref 0.1–1.0)
Monocytes Relative: 6 %
Neutro Abs: 15.9 10*3/uL — ABNORMAL HIGH (ref 1.7–7.7)
Neutrophils Relative %: 86 %
PLATELETS: 373 10*3/uL (ref 150–400)
RBC: 2.55 MIL/uL — ABNORMAL LOW (ref 4.22–5.81)
RDW: 14.4 % (ref 11.5–15.5)
WBC: 18.6 10*3/uL — ABNORMAL HIGH (ref 4.0–10.5)
nRBC: 0 % (ref 0.0–0.2)

## 2019-03-07 LAB — LACTIC ACID, PLASMA
Lactic Acid, Venous: 2.9 mmol/L (ref 0.5–1.9)
Lactic Acid, Venous: 1.7 mmol/L (ref 0.5–1.9)

## 2019-03-07 MED ORDER — HEPARIN SODIUM (PORCINE) 5000 UNIT/ML IJ SOLN
5000.0000 [IU] | Freq: Three times a day (TID) | INTRAMUSCULAR | Status: DC
  Administered 2019-03-07 – 2019-03-10 (×6): 5000 [IU] via SUBCUTANEOUS
  Filled 2019-03-07 (×7): qty 1

## 2019-03-07 MED ORDER — SODIUM CHLORIDE 0.9 % IV BOLUS
500.0000 mL | Freq: Once | INTRAVENOUS | Status: AC
  Administered 2019-03-07: 500 mL via INTRAVENOUS

## 2019-03-07 MED ORDER — VANCOMYCIN HCL IN DEXTROSE 1-5 GM/200ML-% IV SOLN: 1000.0000 mg | Freq: Once | INTRAVENOUS | Status: DC

## 2019-03-07 MED ORDER — ACETAMINOPHEN 325 MG PO TABS
650.0000 mg | ORAL_TABLET | Freq: Four times a day (QID) | ORAL | Status: DC | PRN
  Administered 2019-03-08 – 2019-03-12 (×6): 650 mg via ORAL
  Filled 2019-03-07 (×6): qty 2

## 2019-03-07 MED ORDER — ACETAMINOPHEN 650 MG RE SUPP: 650.0000 mg | Freq: Four times a day (QID) | RECTAL | Status: DC | PRN

## 2019-03-07 MED ORDER — DIALYVITE/ZINC PO TABS: 1.0000 | ORAL_TABLET | Freq: Two times a day (BID) | ORAL | Status: DC

## 2019-03-07 MED ORDER — SODIUM CHLORIDE 0.9% FLUSH
3.0000 mL | Freq: Two times a day (BID) | INTRAVENOUS | Status: DC
  Administered 2019-03-07 – 2019-03-13 (×12): 3 mL via INTRAVENOUS

## 2019-03-07 MED ORDER — SODIUM CHLORIDE 0.9 % IV SOLN
250.0000 mg | INTRAVENOUS | Status: DC
  Filled 2019-03-07: qty 20

## 2019-03-07 MED ORDER — SODIUM CHLORIDE 0.9 % IV SOLN
INTRAVENOUS | Status: AC
  Administered 2019-03-07 – 2019-03-08 (×2): via INTRAVENOUS

## 2019-03-07 MED ORDER — ALLOPURINOL 100 MG PO TABS: 100.0000 mg | ORAL_TABLET | Freq: Every day | ORAL | Status: DC

## 2019-03-07 MED ORDER — ACETAMINOPHEN 500 MG PO TABS
1000.0000 mg | ORAL_TABLET | Freq: Once | ORAL | Status: AC
  Administered 2019-03-07: 1000 mg via ORAL
  Filled 2019-03-07: qty 2

## 2019-03-07 MED ORDER — RENA-VITE PO TABS
1.0000 | ORAL_TABLET | Freq: Every day | ORAL | Status: DC
  Administered 2019-03-07 – 2019-03-13 (×7): 1 via ORAL
  Filled 2019-03-07 (×7): qty 1

## 2019-03-07 MED ORDER — DARBEPOETIN ALFA 100 MCG/0.5ML IJ SOSY: 100.0000 ug | PREFILLED_SYRINGE | INTRAMUSCULAR | Status: DC

## 2019-03-07 MED ORDER — FINASTERIDE 5 MG PO TABS
5.0000 mg | ORAL_TABLET | Freq: Every day | ORAL | Status: DC
  Administered 2019-03-09 – 2019-03-13 (×5): 5 mg via ORAL
  Filled 2019-03-07 (×5): qty 1

## 2019-03-07 MED ORDER — ATORVASTATIN CALCIUM 10 MG PO TABS
20.0000 mg | ORAL_TABLET | Freq: Every day | ORAL | Status: DC
  Administered 2019-03-07 – 2019-03-12 (×5): 20 mg via ORAL
  Filled 2019-03-07 (×5): qty 2

## 2019-03-07 MED ORDER — VANCOMYCIN HCL IN DEXTROSE 750-5 MG/150ML-% IV SOLN
750.0000 mg | INTRAVENOUS | Status: DC
  Filled 2019-03-07: qty 150

## 2019-03-07 MED ORDER — SODIUM CHLORIDE 0.9 % IV SOLN
2.0000 g | Freq: Once | INTRAVENOUS | Status: AC
  Administered 2019-03-07: 2 g via INTRAVENOUS
  Filled 2019-03-07: qty 20

## 2019-03-07 MED ORDER — VANCOMYCIN HCL 10 G IV SOLR
1750.0000 mg | Freq: Once | INTRAVENOUS | Status: AC
  Administered 2019-03-07: 1750 mg via INTRAVENOUS
  Filled 2019-03-07: qty 1750

## 2019-03-07 MED ORDER — SENNOSIDES-DOCUSATE SODIUM 8.6-50 MG PO TABS: 1.0000 | ORAL_TABLET | Freq: Every evening | ORAL | Status: DC | PRN

## 2019-03-07 MED ORDER — HYDROMORPHONE HCL 1 MG/ML IJ SOLN
0.5000 mg | INTRAMUSCULAR | Status: DC | PRN
  Administered 2019-03-07 – 2019-03-13 (×12): 0.5 mg via INTRAVENOUS
  Filled 2019-03-07 (×13): qty 0.5

## 2019-03-07 NOTE — ED Notes (Signed)
Pt given [place] sandwich and ginger ale. Pt and wife are aware that Pt cannot eat after midnight.

## 2019-03-07 NOTE — Progress Notes (Signed)
Pt arrived to the unit via stretcher from ED. Pt a/o X 4, showing no signs of distress. Skin assessment completed with Ronalee Belts RN, right foot noted to have anterior serous filled blister, and pulse absent. Wife at the bedside. RN to continue to monitor.

## 2019-03-07 NOTE — Progress Notes (Signed)
Pharmacy Antibiotic Note  Ernest Popowski Sr. is a 73 y.o. male admitted on 03/01/2019 with cellulitis.  Pharmacy has been consulted for vancomycin dosing. Of note patient has PMH for ESRD on HD MWF (last session 03/06/19). Patient febrile on admission (Tmax 103.4), RR 21, HR 116, WBC elevated of 18.6, LA 2.9.  Plan: Vancomcyin 1750 mg IV x 1, then vancomycin 750 mg IV qHD Obtain vancomycin trough PRN F/u HD schedule for dose adjustments F/u C&S, de-escalation plans, and LOT  Height: 5\' 7"  (170.2 cm) Weight: 176 lb (79.8 kg) IBW/kg (Calculated) : 66.1  Temp (24hrs), Avg:101.2 F (38.4 C), Min:98.9 F (37.2 C), Max:103.4 F (39.7 C)  Recent Labs  Lab 02/21/2019 1643  WBC 18.6*  CREATININE 6.80*  LATICACIDVEN 2.9*    Estimated Creatinine Clearance: 9.8 mL/min (A) (by C-G formula based on SCr of 6.8 mg/dL (H)).    Allergies  Allergen Reactions  . Nicotine Transdermal System [Nicotine] Other (See Comments)    Vivid dreams    Antimicrobials this admission: Vancomycin 3/19 >>  Ceftriaxone 3/19 >>   Dose adjustments this admission: N/A  Microbiology results: 3/19 BCx:   Thank you for allowing pharmacy to be a part of this patient's care.  Leron Croak, PharmD PGY1 Pharmacy Resident Phone: [phone]  Please check AMION for all [facility] phone numbers 03/08/2019 5:48 PM

## 2019-03-07 NOTE — ED Notes (Signed)
Patient transported to X-ray 

## 2019-03-07 NOTE — ED Notes (Signed)
ED TO INPATIENT HANDOFF REPORT  ED Nurse Name and Phone #: Judson Roch [phone]  S Name/Age/Gender Kevin Sinning Sr. 73 y.o. male Room/Bed: 018C/018C  Code Status   Code Status: Full Code  Home/SNF/Other Home Patient oriented to: self, place, time and situation Is this baseline? Yes   Triage Complete: Triage complete  Chief Complaint R Leg Circulation Problems  Triage Note Pt here for evaluation of R leg pain. Sts he was seen her on 3/5 and was told that his leg has to be amputated. Pt unable to tolerate pain and is unable to get around on his own now. Pt MWF dialysis pt, completed whole tx yesterday.    Allergies Allergies  Allergen Reactions  . Nicotine Transdermal System [Nicotine] Other (See Comments)    Vivid dreams    Level of Care/Admitting Diagnosis ED Disposition    ED Disposition Condition [facility] Area: [place] [100100]  Level of Care: Telemetry Surgical [105]  Diagnosis: Critical lower limb ischemia [542706]  Admitting Physician: Oda Kilts [[id number]]  Attending Physician: Oda Kilts [[id number]]  Estimated length of stay: past midnight tomorrow  Certification:: I certify this patient will need inpatient services for at least 2 midnights  PT Class (Do Not Modify): Inpatient [101]  PT Acc Code (Do Not Modify): Private [1]       B Medical/Surgery History Past Medical History:  Diagnosis Date  . Acute left systolic heart failure ([place]) 01/06/2016  . Adenoma of left adrenal gland 06/16/2014   CT Abdomen (06/04/2014): Incidental, 20 mm, 8.33 HU suggesting benign adenoma   . Alcohol abuse 02/15/2007   Reports consuming 1 pint of gin on weekends, does not drink every day. Denies history of seizures, blackouts, or tremors.    . Allergic rhinitis 04/09/2014  . Anemia 11/12/2013   Unclear cause as of yet (? EtOH abuse)   . Anemia of chronic kidney failure, stage 4 (severe) ([place]) 07/28/2017  . Benign prostatic  hypertrophy 06/02/2014  . Chronic kidney disease, stage 3 ([place]) 11/12/2013  . Chronic systolic heart failure ([place]) 01/15/2016   Echo (01/07/2016): LVEF 35%  . COPD (chronic obstructive pulmonary disease) ([place])   . Coronary artery disease 02/15/2007   Non-STEMI 07/2005, s/p CABG x 4 (08/01/2005): LIMA to LAD, SVG to OM, RCA, PCA   . DVT (deep venous thrombosis) ([place]) 2006   RLE "when I had OHS"  . Erectile dysfunction associated with type 2 diabetes mellitus ([place]) 02/15/2007  . Essential hypertension 02/15/2007  . Hyperlipidemia 02/15/2007  . Major depression, single episode 11/02/2015  . Obesity (BMI 30.0-34.9) 02/15/2007  . Peripheral vascular occlusive disease ([place]) 12/30/2013   ABI (01/03/2014): Right 0.64, Left 0.64   . Pneumonia X 1  . Secondary hyperparathyroidism of renal origin ([place]) 07/28/2017  . Sigmoid diverticulosis 01/15/2016   Minimal per colonoscopy 01/11/2016  . Tobacco abuse 02/15/2007  . Tubular adenoma of colon 01/11/2016   Two 6 mm semi-pedunculated tubular adenomas excised endoscopically 01/11/2016.  Repeat colonoscopy due 12/2020.  . Type 2 diabetes mellitus with mild nonproliferative diabetic retinopathy with macular edema, bilateral ([place]) 10/28/2016  . Type 2 diabetes mellitus with stage 3 chronic kidney disease ([place]) 02/15/2007  . Venous thromboembolism 11/11/2013   LE Dopplers (01/2011): RLE DVT. CTA with mild subacute to chronic right-sided pulmonary emboli.  Patient has elected to continue anticoagulation.    Past Surgical History:  Procedure Laterality Date  . ABDOMINAL AORTOGRAM W/LOWER EXTREMITY N/A 02/14/2019   Procedure: ABDOMINAL  AORTOGRAM W/LOWER EXTREMITY;  Surgeon: Waynetta Sandy, MD;  Location: [facility];  Service: Cardiovascular;  Laterality: N/A;  . AV FISTULA PLACEMENT  07/23/2018   Procedure: RIGHT UPPER ARM ARTERIOVENOUS (AV) BRACHIOCEPHALIC FISTULA CREATION;  Surgeon: Conrad Canadohta Lake, MD;  Location: [facility];  Service: Vascular;;  . CARDIAC  CATHETERIZATION  07/2005  . CATARACT EXTRACTION W/ INTRAOCULAR LENS  IMPLANT, BILATERAL Bilateral 04/2014   Left 04/30/2014, Right 05/07/2014  . COLONOSCOPY WITH PROPOFOL N/A 01/11/2016   Procedure: COLONOSCOPY WITH PROPOFOL;  Surgeon: Ronald Lobo, MD;  Location: [facility];  Service: Endoscopy;  Laterality: N/A;  Needs MAC.  Marland Kitchen CORONARY ARTERY BYPASS GRAFT  08/01/2005   x 4, LIMA to LAD, SVG to OM, RCA, PCA  . ESOPHAGOGASTRODUODENOSCOPY N/A 01/08/2016   Procedure: ESOPHAGOGASTRODUODENOSCOPY (EGD);  Surgeon: Wilford Corner, MD;  Location[facility] END[facility]COPY;  Service: Endoscopy;  Laterality: N/A;  . GIVENS CAPSULE STUDY N/A 01/12/2016   Procedure: GIVENS CAPSULE STUDY;  Surgeon: Ronald Lobo, MD;  Location: [facility];  Service: Endoscopy;  Laterality: N/A;  . INSERTION OF DIALYSIS CATHETER Right 07/23/2018   Procedure: INSERTION OF  DIALYSIS CATHETER - RIGHT INTERNAL JUGULAR PLACEMENT;  Surgeon: Conrad Exmore, MD;  Location: [facility];  Service: Vascular;  Laterality: Right;     A IV Location/Drains/Wounds Patient Lines/Drains/Airways Status   Active Line/Drains/Airways    Name:   Placement date:   Placement time:   Site:   Days:   Peripheral IV 03/04/2019 Left;Anterior Forearm   03/06/2019    1756    Forearm   less than 1   Fistula / Graft Right Upper arm Arteriovenous fistula   07/23/18    1408    Upper arm   227   Hemodialysis Catheter Right Internal jugular Double-lumen;Permanent   07/23/18    1255    Internal jugular   227   Incision (Closed) 07/23/18 Chest Right   07/23/18    1259     227   Incision (Closed) 07/23/18 Arm Right   07/23/18    1330     227          Intake/Output Last 24 hours  Intake/Output Summary (Last 24 hours) at 03/17/2019 2050 Last data filed at 03/02/2019 1840 Gross per 24 hour  Intake 100 ml  Output -  Net 100 ml    Labs/Imaging Results for orders placed or performed during the hospital encounter of 03/03/2019 (from the past 48 hour(s))  Comprehensive metabolic  panel     Status: Abnormal   Collection Time: 03/10/2019  4:43 PM  Result Value Ref Range   Sodium 134 (L) 135 - 145 mmol/L   Potassium 3.9 3.5 - 5.1 mmol/L   Chloride 93 (L) 98 - 111 mmol/L   CO2 23 22 - 32 mmol/L   Glucose, Bld 113 (H) 70 - 99 mg/dL   BUN 34 (H) 8 - 23 mg/dL   Creatinine, Ser 6.80 (H) 0.61 - 1.24 mg/dL   Calcium 8.9 8.9 - 10.3 mg/dL   Total Protein 8.3 (H) 6.5 - 8.1 g/dL   Albumin 2.5 (L) 3.5 - 5.0 g/dL   AST 191 (H) 15 - 41 U/L   ALT 73 (H) 0 - 44 U/L   Alkaline Phosphatase 68 38 - 126 U/L   Total Bilirubin 0.5 0.3 - 1.2 mg/dL   GFR calc non Af Amer 7 (L) >60 mL/min   GFR calc Af Amer 8 (L) >60 mL/min   Anion gap 18 (H)  5 - 15    Comment: Performed at [facility], [place] [address], [place], [place] [address]  CBC with Differential     Status: Abnormal   Collection Time: 03/06/2019  4:43 PM  Result Value Ref Range   WBC 18.6 (H) 4.0 - 10.5 K/uL   RBC 2.55 (L) 4.22 - 5.81 MIL/uL   Hemoglobin 7.8 (L) 13.0 - 17.0 g/dL   HCT 25.4 (L) 39.0 - 52.0 %   MCV 99.6 80.0 - 100.0 fL   MCH 30.6 26.0 - 34.0 pg   MCHC 30.7 30.0 - 36.0 g/dL   RDW 14.4 11.5 - 15.5 %   Platelets 373 150 - 400 K/uL   nRBC 0.0 0.0 - 0.2 %   Neutrophils Relative % 86 %   Neutro Abs 15.9 (H) 1.7 - 7.7 K/uL   Lymphocytes Relative 7 %   Lymphs Abs 1.4 0.7 - 4.0 K/uL   Monocytes Relative 6 %   Monocytes Absolute 1.2 (H) 0.1 - 1.0 K/uL   Eosinophils Relative 0 %   Eosinophils Absolute 0.0 0.0 - 0.5 K/uL   Basophils Relative 0 %   Basophils Absolute 0.0 0.0 - 0.1 K/uL   Immature Granulocytes 1 %   Abs Immature Granulocytes 0.18 (H) 0.00 - 0.07 K/uL    Comment: Performed at [facility], [address] [place], [place] [address]  Lactic acid, plasma     Status: Abnormal   Collection Time: 02/23/2019  4:43 PM  Result Value Ref Range   Lactic Acid, Venous 2.9 (HH) 0.5 - 1.9 mmol/L    Comment: CRITICAL RESULT CALLED TO, READ BACK BY AND VERIFIED WITH: Roddie Mc 1730 02/27/2019  WBOND Performed at [facility], [place] [address], [place], [place] [address]   Lactic acid, plasma     Status: None   Collection Time: 02/27/2019  6:03 PM  Result Value Ref Range   Lactic Acid, Venous 1.7 0.5 - 1.9 mmol/L    Comment: Performed at [facility] [address] [place], [place] [address]   Dg Foot Complete Right  Result Date: 03/16/2019 CLINICAL DATA:  Chronic right foot pain. Possible gangrene. EXAM: RIGHT FOOT COMPLETE - 3+ VIEW COMPARISON:  None. FINDINGS: There is no focal osseous abnormality. No osteolysis, specifically. No visible soft tissue ulceration. No soft tissue emphysema. IMPRESSION: No radiographic evidence of osteomyelitis. Electronically Signed   By: Ulyses Jarred M.D.   On: 02/24/2019 17:32    Pending Labs Unresulted Labs (From admission, onward)    Start     Ordered   03/19/2019 0500  Comprehensive metabolic panel  Tomorrow morning,   R    Question:  Specimen collection method  Answer:  IV Team=IV Team collect   02/23/2019 2017   03/01/2019 0500  CBC  Tomorrow morning,   R    Question:  Specimen collection method  Answer:  IV Team=IV Team collect   02/19/2019 2017   02/20/2019 0500  Iron and TIBC  Tomorrow morning,   R    Question:  Specimen collection method  Answer:  IV Team=IV Team collect   03/18/2019 2017   02/28/2019 0500  Ferritin  Tomorrow morning,   R    Question:  Specimen collection method  Answer:  IV Team=IV Team collect   02/25/2019 2017   03/06/2019 1604  Culture, blood (routine x 2)  BLOOD CULTURE X 2,   STAT     03/09/2019 1605  Vitals/Pain Today's Vitals   02/28/2019 1904 03/12/2019 1905 02/22/2019 1945 03/02/2019 2045  BP:   124/61 111/65  Pulse:      Resp:   (!) 21 (!) 23  Temp:      TempSrc:      SpO2:      Weight:      Height:      PainSc: 8  8       Isolation Precautions No active isolations  Medications Medications  vancomycin (VANCOCIN) 1,750 mg in sodium chloride 0.9 % 500 mL IVPB (1,750 mg Intravenous New  Bag/Given 02/18/2019 1902)  vancomycin (VANCOCIN) IVPB 750 mg/150 ml premix (has no administration in time range)  atorvastatin (LIPITOR) tablet 20 mg (has no administration in time range)  Darbepoetin Alfa (ARANESP) injection 100 mcg (has no administration in time range)  ferric gluconate (NULECIT) 250 mg in sodium chloride 0.9 % 100 mL IVPB (has no administration in time range)  Dialyvite/Zinc TABS 1 tablet (has no administration in time range)  heparin injection 5,000 Units (has no administration in time range)  sodium chloride flush (NS) 0.9 % injection 3 mL (has no administration in time range)  senna-docusate (Senokot-S) tablet 1 tablet (has no administration in time range)  acetaminophen (TYLENOL) tablet 650 mg (has no administration in time range)    Or  acetaminophen (TYLENOL) suppository 650 mg (has no administration in time range)  0.9 %  sodium chloride infusion (has no administration in time range)  cefTRIAXone (ROCEPHIN) 2 g in sodium chloride 0.9 % 100 mL IVPB (0 g Intravenous Stopped 03/03/2019 1840)  acetaminophen (TYLENOL) tablet 1,000 mg (1,000 mg Oral Given 03/18/2019 1811)  sodium chloride 0.9 % bolus 500 mL (500 mLs Intravenous New Bag/Given 02/20/2019 1811)  sodium chloride 0.9 % bolus 500 mL (500 mLs Intravenous New Bag/Given 03/09/2019 1903)    Mobility walks with person assist High fall risk   Focused Assessments    R Recommendations: See Admitting Provider Note  Report given to:   Additional Notes:

## 2019-03-07 NOTE — H&P (Signed)
Date: 02/28/2019               Patient Name:  Kevin Preast Sr. MRN: [id number]  DOB: [birth year] Age / Sex: 73 y.o., male   PCP: Oval Linsey, MD         Medical Service: Internal Medicine Teaching Service         Attending Physician: Dr. Rebeca Alert Raynaldo Opitz, MD    First Contact: Dr. Harlow Ohms Pager: [phone]  Second Contact: Dr. Velna Ochs Pager: [phone]       After Hours (After 5p/  First Contact Pager: [phone]  weekends / holidays): Second Contact Pager: [phone]   Chief Complaint: Right leg pain  History of Present Illness: Mr. Tulloch is a 73 year old male with ESRD on HD, CHF, COPD, CAD, PVD, T2DM, and alcoholism who presents with right leg pain. He has known PVD and is followed by Dr. Donzetta Matters with vascular surgery.  He recently developed toe ulceration and underwent angiogram.  He was recommended to undergo an above-the-knee amputation.  His family wanted a second opinion and they decided to go to a provider in [place] (found on the Internet).  The patient subsequently drove to [place] where he had an 8-day hospitalization which included 2 arteriograms of his right leg.  Providers in [place] recommended leg amputation.  He was then driven back to [facility].  Since returning to [facility], he reports continued right leg pain. He denies fevers, chills, shortness of breath, chest pain, urinary symptoms, changes in bowel movements.  In the ED he was found to have mildly elevated blood pressures (SBP 140s-150s) and mild tachycardia (100s-110s).  Rectal temp was 103.4.  Code sepsis was called and he was given vancomycin and ceftriaxone.  Labs were significant for CMP- elevated creatinine 6.80 (consistent with end-stage renal disease), elevated AST/ALT to 191/73.  CBC-leukocytosis with WBC of 18.6 with neutrophil predominance, normocytic anemia with hemoglobin of 7.8 (11.2 three weeks ago).  Lactate 2.9.  He was seen by vascular surgery who recommended a right above-the-knee  amputation and IMTS was called for admission.  Meds:  Current Meds  Medication Sig   allopurinol (ZYLOPRIM) 100 MG tablet Take 1 tablet (100 mg total) by mouth daily.   atorvastatin (LIPITOR) 20 MG tablet Take 1 tablet (20 mg total) by mouth daily.   B Complex-C-Zn-Folic Acid (DIALYVITE/ZINC) TABS Take 1 tablet by mouth 2 (two) times daily.    finasteride (PROSCAR) 5 MG tablet Take 1 tablet (5 mg total) by mouth daily.   lidocaine-prilocaine (EMLA) cream Apply 1 application topically as needed (topical anesthesia for hemodialysis if Gebauers and Lidocaine injection are ineffective.).     Allergies: Allergies as of 03/18/2019 - Review Complete 02/22/2019  Allergen Reaction Noted   Nicotine transdermal system [nicotine] Other (See Comments) 08/19/2015   Past Medical History:  Diagnosis Date   Acute left systolic heart failure ([place]) 01/06/2016   Adenoma of left adrenal gland 06/16/2014   CT Abdomen (06/04/2014): Incidental, 20 mm, 8.33 HU suggesting benign adenoma    Alcohol abuse 02/15/2007   Reports consuming 1 pint of gin on weekends, does not drink every day. Denies history of seizures, blackouts, or tremors.     Allergic rhinitis 04/09/2014   Anemia 11/12/2013   Unclear cause as of yet (? EtOH abuse)    Anemia of chronic kidney failure, stage 4 (severe) ([place]) 07/28/2017   Benign prostatic hypertrophy 06/02/2014   Chronic kidney disease, stage 3 ([place]) 11/12/2013  Chronic systolic heart failure ([place]) 01/15/2016   Echo (01/07/2016): LVEF 35%   COPD (chronic obstructive pulmonary disease) ([facility])    Coronary artery disease 02/15/2007   Non-STEMI 07/2005, s/p CABG x 4 (08/01/2005): LIMA to LAD, SVG to OM, RCA, PCA    DVT (deep venous thrombosis) ([place]) 2006   RLE "when I had OHS"   Erectile dysfunction associated with type 2 diabetes mellitus ([place]) 02/15/2007   Essential hypertension 02/15/2007   Hyperlipidemia 02/15/2007   Major depression, single episode 11/02/2015    Obesity (BMI 30.0-34.9) 02/15/2007   Peripheral vascular occlusive disease ([place]) 12/30/2013   ABI (01/03/2014): Right 0.64, Left 0.64    Pneumonia X 1   Secondary hyperparathyroidism of renal origin ([place]) 07/28/2017   Sigmoid diverticulosis 01/15/2016   Minimal per colonoscopy 01/11/2016   Tobacco abuse 02/15/2007   Tubular adenoma of colon 01/11/2016   Two 6 mm semi-pedunculated tubular adenomas excised endoscopically 01/11/2016.  Repeat colonoscopy due 12/2020.   Type 2 diabetes mellitus with mild nonproliferative diabetic retinopathy with macular edema, bilateral ([place]) 10/28/2016   Type 2 diabetes mellitus with stage 3 chronic kidney disease ([place]) 02/15/2007   Venous thromboembolism 11/11/2013   LE Dopplers (01/2011): RLE DVT. CTA with mild subacute to chronic right-sided pulmonary emboli.  Patient has elected to continue anticoagulation.     Family History:  Family History  Problem Relation Age of Onset   Heart disease Father    COPD Father    Diabetes Mellitus II Mother    Healthy Sister    Healthy Brother    Healthy Daughter    Healthy Son    Healthy Sister    Healthy Sister    Healthy Sister    Healthy Sister    Healthy Daughter    Healthy Son    Healthy Son    Healthy Son    Healthy Son    Healthy Son    Healthy Son     Social History: He is retired.  He used to work as a Administrator.  He currently lives with multiple family members in [place].  He reports alcohol use stating that he normally drinks on the weekends and will have 3-4 shots of liquor.  He also endorses tobacco use for "many years" with approximately 3 cigarettes/day.  He denies illicit drug use.  Review of Systems: A complete ROS was negative except as per HPI.   Physical Exam: Blood pressure 101/69, pulse (!) 108, temperature (!) 103.4 F (39.7 C), temperature source Rectal, resp. rate (!) 21, height 5\' 7"  (1.702 m), weight 79.8 kg, SpO2 99 %. General: Lying in bed in no acute  distress HEENT: Normocephalic, atraumatic, EOMI CV: Normal rate, regular rhythm Resp: Coarse breath sounds, normal WOB, no respiratory distress Abd: Non-tender to palpation, soft, no masses appreciated MSK: Left LE- Warm to touch, shiny appearing skin without hair, palpable pulses.  Right LE- Cool to touch. Darkened skin of leg, shiny appearing without hair, palpable pulses, blistering to the medial aspect of the left foot (see picture below).  Neuro: Alert and oriented Psych: Normal affect and mood  EKG: personally reviewed my interpretation is sinus tachycardia.  Right Foot XR:  FINDINGS: There is no focal osseous abnormality. No osteolysis, specifically. No visible soft tissue ulceration. No soft tissue emphysema. IMPRESSION: No radiographic evidence of osteomyelitis.  Assessment & Plan by Problem: Active Problems:   Critical lower limb ischemia  Mr. Guard presents with leg pain secondary to right lower extremity vascular disease.  He  has been seen by vascular surgery who recommends an above-the-knee amputation.   Right lower limb ischemia with potential soft tissue infection: Appreciate vascular surgery recommendations.  - s/p vancomycin and ceftriaxone - Hydromorphone 0.5 mg q4h PRN pain - Keep n.p.o. - Possible right above-the-knee amputation tomorrow - Blood cultures pending  ESRD on HD (MWF): Last dialysis session yesterday. - Consulted nephrology for inpatient dialysis.  Chronic Systolic Congestive Heart Failure: Appears euvolemic on exam and stable. Will continue to monitor.  Normocytic Anemia: Hb 7.8 (11.2 three weeks ago). Unclear etiology. No signs of active bleeding.  - Iron and TIBC - Ferritin  Type 2 diabetes: Diet-controlled and stable.  FEN/GI: NPO at midnight CODE STATUS: Full DVT PPX: subcu heparin  Dispo: Admit patient to Inpatient with expected length of stay greater than 2 midnights.  Signed: Carroll Sage, MD 03/19/2019, 7:10 PM

## 2019-03-07 NOTE — ED Provider Notes (Signed)
[facility] EMERGENCY DEPARTMENT Provider Note   CSN: [id number] Arrival date & time: 03/15/2019  1536    History   Chief Complaint No chief complaint on file.   HPI Kevin Papesh Sr. is a 73 y.o. male.     73yo M w/ PMH including ESRD on HD, CHF, COPD, CAD, PVD, T2DM, alcoholism who p/w R leg pain.  The patient has known peripheral vascular disease and has followed with Dr. Donzetta Matters, vascular surgery.  He was told recently that he would need a right leg amputation due to his vascular disease.  Family wanted a second opinion and they searched on the Internet and found a provider in [place].  They drove the patient to [place] where he had an 8-day hospitalization which included 2 arteriograms of his right leg.  Ultimately, the recommendations were the same, to have a leg amputation.  Family drove him back from [place] yesterday.  He has continued to have pain in his right leg and foot.  He has recently developed some blistering on his foot.  Family reports that he has had a poor appetite.  No known fevers or URI symptoms.  Today they decided to bring him back to the ER for reevaluation given that he is willing to proceed with surgery.  The history is provided by the patient and a relative.    Past Medical History:  Diagnosis Date  . Acute left systolic heart failure ([place]) 01/06/2016  . Adenoma of left adrenal gland 06/16/2014   CT Abdomen (06/04/2014): Incidental, 20 mm, 8.33 HU suggesting benign adenoma   . Alcohol abuse 02/15/2007   Reports consuming 1 pint of gin on weekends, does not drink every day. Denies history of seizures, blackouts, or tremors.    . Allergic rhinitis 04/09/2014  . Anemia 11/12/2013   Unclear cause as of yet (? EtOH abuse)   . Anemia of chronic kidney failure, stage 4 (severe) ([place]) 07/28/2017  . Benign prostatic hypertrophy 06/02/2014  . Chronic kidney disease, stage 3 ([place]) 11/12/2013  . Chronic systolic heart failure ([place]) 01/15/2016   Echo (01/07/2016):  LVEF 35%  . COPD (chronic obstructive pulmonary disease) ([place])   . Coronary artery disease 02/15/2007   Non-STEMI 07/2005, s/p CABG x 4 (08/01/2005): LIMA to LAD, SVG to OM, RCA, PCA   . DVT (deep venous thrombosis) ([place]) 2006   RLE "when I had OHS"  . Erectile dysfunction associated with type 2 diabetes mellitus ([place]) 02/15/2007  . Essential hypertension 02/15/2007  . Hyperlipidemia 02/15/2007  . Major depression, single episode 11/02/2015  . Obesity (BMI 30.0-34.9) 02/15/2007  . Peripheral vascular occlusive disease ([place]) 12/30/2013   ABI (01/03/2014): Right 0.64, Left 0.64   . Pneumonia X 1  . Secondary hyperparathyroidism of renal origin ([place]) 07/28/2017  . Sigmoid diverticulosis 01/15/2016   Minimal per colonoscopy 01/11/2016  . Tobacco abuse 02/15/2007  . Tubular adenoma of colon 01/11/2016   Two 6 mm semi-pedunculated tubular adenomas excised endoscopically 01/11/2016.  Repeat colonoscopy due 12/2020.  . Type 2 diabetes mellitus with mild nonproliferative diabetic retinopathy with macular edema, bilateral ([place]) 10/28/2016  . Type 2 diabetes mellitus with stage 3 chronic kidney disease ([place]) 02/15/2007  . Venous thromboembolism 11/11/2013   LE Dopplers (01/2011): RLE DVT. CTA with mild subacute to chronic right-sided pulmonary emboli.  Patient has elected to continue anticoagulation.     Patient Active Problem List   Diagnosis Date Noted  . Anemia 09/25/2018  . Acute on chronic left systolic heart failure (  [place]) 07/18/2018  . Rib pain on left side 07/12/2018  . Anemia of chronic kidney failure, stage 5 ([place]) 07/28/2017  . Secondary hyperparathyroidism of renal origin ([place]) 07/28/2017  . Type 2 diabetes mellitus with mild nonproliferative diabetic retinopathy with macular edema, bilateral ([place]) 10/28/2016  . Chronic systolic heart failure ([place]) 01/15/2016  . Sigmoid diverticulosis 01/15/2016  . Tubular adenoma of colon 01/11/2016  . Benign prostatic hyperplasia 06/02/2014  . Allergic rhinitis  04/09/2014  . Peripheral vascular occlusive disease ([place]) 12/30/2013  . Chronic kidney disease, stage 5 ([place]) 11/12/2013  . Healthcare maintenance 11/12/2013  . History of venous thromboembolism 11/11/2013  . Type 2 diabetes mellitus with stage 5 chronic kidney disease ([place]) 02/15/2007  . Hyperlipidemia 02/15/2007  . Obesity (BMI 30.0-34.9) 02/15/2007  . Erectile dysfunction associated with type 2 diabetes mellitus ([place]) 02/15/2007  . Alcohol abuse 02/15/2007  . Tobacco abuse 02/15/2007  . Essential hypertension 02/15/2007  . Coronary artery disease involving autologous vein coronary bypass graft with angina pectoris ([place]) 02/15/2007    Past Surgical History:  Procedure Laterality Date  . ABDOMINAL AORTOGRAM W/LOWER EXTREMITY N/A 02/14/2019   Procedure: ABDOMINAL AORTOGRAM W/LOWER EXTREMITY;  Surgeon: Waynetta Sandy, MD;  Location: [facility];  Service: Cardiovascular;  Laterality: N/A;  . AV FISTULA PLACEMENT  07/23/2018   Procedure: RIGHT UPPER ARM ARTERIOVENOUS (AV) BRACHIOCEPHALIC FISTULA CREATION;  Surgeon: Conrad Truro, MD;  Location: [facility];  Service: Vascular;;  . CARDIAC CATHETERIZATION  07/2005  . CATARACT EXTRACTION W/ INTRAOCULAR LENS  IMPLANT, BILATERAL Bilateral 04/2014   Left 04/30/2014, Right 05/07/2014  . COLONOSCOPY WITH PROPOFOL N/A 01/11/2016   Procedure: COLONOSCOPY WITH PROPOFOL;  Surgeon: Ronald Lobo, MD;  Location: [facility];  Service: Endoscopy;  Laterality: N/A;  Needs MAC.  Marland Kitchen CORONARY ARTERY BYPASS GRAFT  08/01/2005   x 4, LIMA to LAD, SVG to OM, RCA, PCA  . ESOPHAGOGASTRODUODENOSCOPY N/A 01/08/2016   Procedure: ESOPHAGOGASTRODUODENOSCOPY (EGD);  Surgeon: Wilford Corner, MD;  Location[facility];  Service: Endoscopy;  Laterality: N/A;  . GIVENS CAPSULE STUDY N/A 01/12/2016   Procedure: GIVENS CAPSULE STUDY;  Surgeon: Ronald Lobo, MD;  Location: [facility];  Service: Endoscopy;  Laterality: N/A;  . INSERTION OF DIALYSIS CATHETER Right  07/23/2018   Procedure: INSERTION OF  DIALYSIS CATHETER - RIGHT INTERNAL JUGULAR PLACEMENT;  Surgeon: Conrad [place], MD;  Location: [facility];  Service: Vascular;  Laterality: Right;        Home Medications    Prior to Admission medications   Medication Sig Start Date End Date Taking? Authorizing Provider  allopurinol (ZYLOPRIM) 100 MG tablet Take 1 tablet (100 mg total) by mouth daily. 07/27/18  Yes Dixie Dials, MD  atorvastatin (LIPITOR) 20 MG tablet Take 1 tablet (20 mg total) by mouth daily. 03/02/18  Yes Oval Linsey, MD  B Complex-C-Zn-Folic Acid (DIALYVITE/ZINC) TABS Take 1 tablet by mouth 2 (two) times daily.  08/03/18  Yes [provider]  finasteride (PROSCAR) 5 MG tablet Take 1 tablet (5 mg total) by mouth daily. 06/01/18  Yes Oval Linsey, MD  lidocaine-prilocaine (EMLA) cream Apply 1 application topically as needed (topical anesthesia for hemodialysis if Gebauers and Lidocaine injection are ineffective.). 07/26/18  Yes Dixie Dials, MD  Darbepoetin Alfa (ARANESP) 100 MCG/0.5ML SOSY injection Inject 0.5 mLs (100 mcg total) into the vein every Thursday with hemodialysis. 08/02/18   Dixie Dials, MD  doxycycline (VIBRAMYCIN) 100 MG capsule Take 1 capsule (100 mg total) by mouth 2 (two) times daily. One  po bid x 7 days Patient not taking: Reported on 02/08/2019 01/19/19   Daleen Bo, MD  ferric gluconate 250 mg in sodium chloride 0.9 % 100 mL Inject 250 mg into the vein every Monday, Wednesday, and Friday with hemodialysis. 07/27/18   Dixie Dials, MD  heparin 1000 unit/mL SOLN injection 1 mL (1,000 Units total) by Dialysis route as needed (in dialysis). 07/26/18   Dixie Dials, MD  pentafluoroprop-tetrafluoroeth [place]ry Dyke) AERO Apply 1 application topically as needed (topical anesthesia for hemodialysis). 07/26/18   Dixie Dials, MD  sodium chloride 0.9 % infusion Inject 250 mLs into the vein as needed (for IV line care(Saline / Heparin Lock)). Patient not taking: Reported on  02/28/2019 07/26/18   Dixie Dials, MD  sodium chloride 0.9 % infusion Inject 100 mLs into the vein as needed (symptomatic hypotension or decrease in SBP < 90 mmHg). Patient not taking: Reported on 02/26/2019 07/26/18   Dixie Dials, MD  sodium chloride 0.9 % infusion Inject 100 mLs into the vein as needed (severe cramping). Patient not taking: Reported on 03/03/2019 07/26/18   Dixie Dials, MD  traMADol (ULTRAM) 50 MG tablet Take 1 tablet (50 mg total) by mouth every 6 (six) hours as needed. Patient not taking: Reported on 03/12/2019 01/19/19   Daleen Bo, MD    Family History Family History  Problem Relation Age of Onset  . Heart disease Father   . COPD Father   . Diabetes Mellitus II Mother   . Healthy Sister   . Healthy Brother   . Healthy Daughter   . Healthy Son   . Healthy Sister   . Healthy Sister   . Healthy Sister   . Healthy Sister   . Healthy Daughter   . Healthy Son   . Healthy Son   . Healthy Son   . Healthy Son   . Healthy Son   . Healthy Son     Social History Social History   Tobacco Use  . Smoking status: Current Every Day Smoker    Packs/day: 0.50    Years: 60.00    Pack years: 30.00    Types: Cigarettes  . Smokeless tobacco: Former Systems developer    Types: Snuff, Chew  Substance Use Topics  . Alcohol use: Yes    Alcohol/week: 5.0 standard drinks    Types: 5 Shots of liquor per week    Comment: 07/18/2018 Drinks 1/2 pint of gin over the weekend but does not drink everyday. Denies hx of blackouts, seizures, tremors.  . Drug use: No     Allergies   Nicotine transdermal system [nicotine]   Review of Systems Review of Systems All other systems reviewed and are negative except that which was mentioned in HPI  Physical Exam Updated Vital Signs BP (!) 152/63   Pulse (!) 116   Temp (!) 103.4 F (39.7 C) (Rectal)   Resp (!) 21   Ht _0  (1.702 m)   Wt 79.8 kg   SpO2 96%   BMI 27.57 kg/m   Physical Exam Vitals signs and nursing note reviewed.   Constitutional:      General: He is not in acute distress.    Appearance: He is well-developed.     Comments: Chronically ill-appearing  HENT:     Head: Normocephalic and atraumatic.     Mouth/Throat:     Mouth: Mucous membranes are moist.     Comments: Poor dentition Eyes:     Conjunctiva/sclera: Conjunctivae normal.     Pupils: Pupils  are equal, round, and reactive to light.  Neck:     Musculoskeletal: Neck supple.  Cardiovascular:     Rate and Rhythm: Regular rhythm. Tachycardia present.     Heart sounds: Normal heart sounds. No murmur.  Pulmonary:     Effort: Pulmonary effort is normal.     Breath sounds: Normal breath sounds.  Abdominal:     General: Bowel sounds are normal. There is no distension.     Palpations: Abdomen is soft.     Tenderness: There is no abdominal tenderness.  Musculoskeletal:        General: Swelling and tenderness present.     Right lower leg: Edema present.     Comments: Edema of right lower leg extending onto right foot with global skin darkening, some necrotic areas on right toes, and several bullae on dorsal foot and great toe; generalized tenderness to palpation of lower leg and right foot  Skin:    General: Skin is warm and dry.  Neurological:     Mental Status: He is alert and oriented to person, place, and time.     Comments: Fluent speech  Psychiatric:        Judgment: Judgment normal.      ED Treatments / Results  Labs (all labs ordered are listed, but only abnormal results are displayed) Labs Reviewed  COMPREHENSIVE METABOLIC PANEL - Abnormal; Notable for the following components:      Result Value   Sodium 134 (*)    Chloride 93 (*)    Glucose, Bld 113 (*)    BUN 34 (*)    Creatinine, Ser 6.80 (*)    Total Protein 8.3 (*)    Albumin 2.5 (*)    AST 191 (*)    ALT 73 (*)    GFR calc non Af Amer 7 (*)    GFR calc Af Amer 8 (*)    Anion gap 18 (*)    All other components within normal limits  CBC WITH DIFFERENTIAL/PLATELET  - Abnormal; Notable for the following components:   WBC 18.6 (*)    RBC 2.55 (*)    Hemoglobin 7.8 (*)    HCT 25.4 (*)    Neutro Abs 15.9 (*)    Monocytes Absolute 1.2 (*)    Abs Immature Granulocytes 0.18 (*)    All other components within normal limits  LACTIC ACID, PLASMA - Abnormal; Notable for the following components:   Lactic Acid, Venous 2.9 (*)    All other components within normal limits  CULTURE, BLOOD (ROUTINE X 2)  CULTURE, BLOOD (ROUTINE X 2)  LACTIC ACID, PLASMA    EKG EKG Interpretation  Date/Time:  Thursday March 07 2019 16:15:51 EDT Ventricular Rate:  122 PR Interval:    QRS Duration: 109 QT Interval:  288 QTC Calculation: 411 R Axis:   81 Text Interpretation:  Atrial fibrillation Borderline right axis deviation Probable LVH with secondary repol abnrm ST depression, consider ischemia, diffuse lds Anterior ST elevation, probably due to LVH tachycardia new from previous but appears to be sinus rhythm with visible P waves Confirmed by Theotis Burrow [phone]) on 02/19/2019 4:17:46 PM   Radiology Dg Foot Complete Right  Result Date: 02/27/2019 CLINICAL DATA:  Chronic right foot pain. Possible gangrene. EXAM: RIGHT FOOT COMPLETE - 3+ VIEW COMPARISON:  None. FINDINGS: There is no focal osseous abnormality. No osteolysis, specifically. No visible soft tissue ulceration. No soft tissue emphysema. IMPRESSION: No radiographic evidence of osteomyelitis. Electronically Signed   By: Lennette Bihari  [place] M.D.   On: 02/24/2019 17:32    Procedures .Critical Care Performed by: Sharlett Iles, MD Authorized by: Sharlett Iles, MD   Critical care provider statement:    Critical care time (minutes):  35   Critical care time was exclusive of:  Separately billable procedures and treating other patients   Critical care was necessary to treat or prevent imminent or life-threatening deterioration of the following conditions:  Sepsis   Critical care was time spent personally  by me on the following activities:  Development of treatment plan with patient or surrogate, discussions with consultants, evaluation of patient's response to treatment, examination of patient, obtaining history from patient or surrogate, ordering and performing treatments and interventions, ordering and review of laboratory studies, ordering and review of radiographic studies, re-evaluation of patient's condition and review of old charts   (including critical care time)  Medications Ordered in ED Medications  cefTRIAXone (ROCEPHIN) 2 g in sodium chloride 0.9 % 100 mL IVPB (2 g Intravenous New Bag/Given 03/15/2019 1810)  vancomycin (VANCOCIN) 1,750 mg in sodium chloride 0.9 % 500 mL IVPB (has no administration in time range)  vancomycin (VANCOCIN) IVPB 750 mg/150 ml premix (has no administration in time range)  acetaminophen (TYLENOL) tablet 1,000 mg (1,000 mg Oral Given 02/28/2019 1811)  sodium chloride 0.9 % bolus 500 mL (500 mLs Intravenous New Bag/Given 02/23/2019 1811)     Initial Impression / Assessment and Plan / ED Course  I have reviewed the triage vital signs and the nursing notes.  Pertinent labs & imaging results that were available during my care of the patient were reviewed by me and considered in my medical decision making (see chart for details).       Patient was alert, unwell appearing but nontoxic, blood pressure mildly elevated, mild tachycardia.  Rectal temp was 103.4.  Initiated a code sepsis with blood and urine cultures.  His leg is edematous and given the worsening pain, I am concerned about underlying infection.  Gave vancomycin and ceftriaxone. Concern for possible gas gangrene in foot given presence of bullae, however plain film was negative for gas and soft tissues.  Work shows potassium 3.9, glucose 113, anion gap 18, WBC 18.6, hemoglobin 7.8.  Show lactate 2.9.  Gave small fluid bolus given his underlying kidney failure.  I discussed the patient's case with vascular  surgeon Dr. Donzetta Matters. Discussed admission w/ Internal Med teaching service and pt admitted for further care. Final Clinical Impressions(s) / ED Diagnoses   Final diagnoses:  Sepsis without acute organ dysfunction, due to unspecified organism [facility])  Lower limb ischemia    ED Discharge Orders    None       Suha Schoenbeck, Wenda Overland, MD 03/12/2019 1840

## 2019-03-07 NOTE — Consult Note (Signed)
ED Consult    Reason for Consult:  Right leg wound Referring Physician:  Dr. Rex Kras MRN #:  [id number]  History of Present Illness: This is a 73 y.o. male with history or esrd. Recently developed toe ulceration and underwent angiogram with recommendation for aka. Has undergone additional angiography at a separate institution with similar recommendation and he now presents here for definitive management.  Having blisters of right foot with significant pain.  Denies any fevers.  Does not take blood thinners other than heparin with dialysis.  Past Medical History:  Diagnosis Date  . Acute left systolic heart failure ([place]) 01/06/2016  . Adenoma of left adrenal gland 06/16/2014   CT Abdomen (06/04/2014): Incidental, 20 mm, 8.33 HU suggesting benign adenoma   . Alcohol abuse 02/15/2007   Reports consuming 1 pint of gin on weekends, does not drink every day. Denies history of seizures, blackouts, or tremors.    . Allergic rhinitis 04/09/2014  . Anemia 11/12/2013   Unclear cause as of yet (? EtOH abuse)   . Anemia of chronic kidney failure, stage 4 (severe) ([place]) 07/28/2017  . Benign prostatic hypertrophy 06/02/2014  . Chronic kidney disease, stage 3 ([place]) 11/12/2013  . Chronic systolic heart failure ([place]) 01/15/2016   Echo (01/07/2016): LVEF 35%  . COPD (chronic obstructive pulmonary disease) ([place])   . Coronary artery disease 02/15/2007   Non-STEMI 07/2005, s/p CABG x 4 (08/01/2005): LIMA to LAD, SVG to OM, RCA, PCA   . DVT (deep venous thrombosis) ([place]) 2006   RLE "when I had OHS"  . Erectile dysfunction associated with type 2 diabetes mellitus ([place]) 02/15/2007  . Essential hypertension 02/15/2007  . Hyperlipidemia 02/15/2007  . Major depression, single episode 11/02/2015  . Obesity (BMI 30.0-34.9) 02/15/2007  . Peripheral vascular occlusive disease ([place]) 12/30/2013   ABI (01/03/2014): Right 0.64, Left 0.64   . Pneumonia X 1  . Secondary hyperparathyroidism of renal origin ([place]) 07/28/2017  . Sigmoid  diverticulosis 01/15/2016   Minimal per colonoscopy 01/11/2016  . Tobacco abuse 02/15/2007  . Tubular adenoma of colon 01/11/2016   Two 6 mm semi-pedunculated tubular adenomas excised endoscopically 01/11/2016.  Repeat colonoscopy due 12/2020.  . Type 2 diabetes mellitus with mild nonproliferative diabetic retinopathy with macular edema, bilateral ([place]) 10/28/2016  . Type 2 diabetes mellitus with stage 3 chronic kidney disease ([place]) 02/15/2007  . Venous thromboembolism 11/11/2013   LE Dopplers (01/2011): RLE DVT. CTA with mild subacute to chronic right-sided pulmonary emboli.  Patient has elected to continue anticoagulation.     Past Surgical History:  Procedure Laterality Date  . ABDOMINAL AORTOGRAM W/LOWER EXTREMITY N/A 02/14/2019   Procedure: ABDOMINAL AORTOGRAM W/LOWER EXTREMITY;  Surgeon: Waynetta Sandy, MD;  Location: [facility];  Service: Cardiovascular;  Laterality: N/A;  . AV FISTULA PLACEMENT  07/23/2018   Procedure: RIGHT UPPER ARM ARTERIOVENOUS (AV) BRACHIOCEPHALIC FISTULA CREATION;  Surgeon: Conrad Reddick, MD;  Location: [facility];  Service: Vascular;;  . CARDIAC CATHETERIZATION  07/2005  . CATARACT EXTRACTION W/ INTRAOCULAR LENS  IMPLANT, BILATERAL Bilateral 04/2014   Left 04/30/2014, Right 05/07/2014  . COLONOSCOPY WITH PROPOFOL N/A 01/11/2016   Procedure: COLONOSCOPY WITH PROPOFOL;  Surgeon: Ronald Lobo, MD;  Location: [facility];  Service: Endoscopy;  Laterality: N/A;  Needs MAC.  Marland Kitchen CORONARY ARTERY BYPASS GRAFT  08/01/2005   x 4, LIMA to LAD, SVG to OM, RCA, PCA  . ESOPHAGOGASTRODUODENOSCOPY N/A 01/08/2016   Procedure: ESOPHAGOGASTRODUODENOSCOPY (EGD);  Surgeon: Wilford Corner, MD;  Location: [facility] END[facility];  Service: Endoscopy;  Laterality: N/A;  . GIVENS CAPSULE STUDY N/A 01/12/2016   Procedure: GIVENS CAPSULE STUDY;  Surgeon: Ronald Lobo, MD;  Location: [facility];  Service: Endoscopy;  Laterality: N/A;  . INSERTION OF DIALYSIS CATHETER Right 07/23/2018    Procedure: INSERTION OF  DIALYSIS CATHETER - RIGHT INTERNAL JUGULAR PLACEMENT;  Surgeon: Conrad , MD;  Location: [facility];  Service: Vascular;  Laterality: Right;    Allergies  Allergen Reactions  . Nicotine Transdermal System [Nicotine] Other (See Comments)    Vivid dreams    Prior to Admission medications   Medication Sig Start Date End Date Taking? Authorizing Provider  allopurinol (ZYLOPRIM) 100 MG tablet Take 1 tablet (100 mg total) by mouth daily. 07/27/18  Yes Dixie Dials, MD  atorvastatin (LIPITOR) 20 MG tablet Take 1 tablet (20 mg total) by mouth daily. 03/02/18  Yes Oval Linsey, MD  B Complex-C-Zn-Folic Acid (DIALYVITE/ZINC) TABS Take 1 tablet by mouth 2 (two) times daily.  08/03/18  Yes [provider]  finasteride (PROSCAR) 5 MG tablet Take 1 tablet (5 mg total) by mouth daily. 06/01/18  Yes Oval Linsey, MD  lidocaine-prilocaine (EMLA) cream Apply 1 application topically as needed (topical anesthesia for hemodialysis if Gebauers and Lidocaine injection are ineffective.). 07/26/18  Yes Dixie Dials, MD  Darbepoetin Alfa (ARANESP) 100 MCG/0.5ML SOSY injection Inject 0.5 mLs (100 mcg total) into the vein every Thursday with hemodialysis. 08/02/18   Dixie Dials, MD  doxycycline (VIBRAMYCIN) 100 MG capsule Take 1 capsule (100 mg total) by mouth 2 (two) times daily. One po bid x 7 days Patient not taking: Reported on 02/08/2019 01/19/19   Daleen Bo, MD  ferric gluconate 250 mg in sodium chloride 0.9 % 100 mL Inject 250 mg into the vein every Monday, Wednesday, and Friday with hemodialysis. 07/27/18   Dixie Dials, MD  heparin 1000 unit/mL SOLN injection 1 mL (1,000 Units total) by Dialysis route as needed (in dialysis). 07/26/18   Dixie Dials, MD  pentafluoroprop-tetrafluoroeth [place]ry [place]ke) AERO Apply 1 application topically as needed (topical anesthesia for hemodialysis). 07/26/18   Dixie Dials, MD  sodium chloride 0.9 % infusion Inject 250 mLs into the vein as needed  (for IV line care(Saline / Heparin Lock)). Patient not taking: Reported on 03/13/2019 07/26/18   Dixie Dials, MD  sodium chloride 0.9 % infusion Inject 100 mLs into the vein as needed (symptomatic hypotension or decrease in SBP < 90 mmHg). Patient not taking: Reported on 02/24/2019 07/26/18   Dixie Dials, MD  sodium chloride 0.9 % infusion Inject 100 mLs into the vein as needed (severe cramping). Patient not taking: Reported on 02/18/2019 07/26/18   Dixie Dials, MD  traMADol (ULTRAM) 50 MG tablet Take 1 tablet (50 mg total) by mouth every 6 (six) hours as needed. Patient not taking: Reported on 03/16/2019 01/19/19   Daleen Bo, MD    Social History   Socioeconomic History  . Marital status: Married    Spouse name: Not on file  . Number of children: Not on file  . Years of education: 33  . Highest education level: Not on file  Occupational History  . 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 Financial resource strain: Not on file  . Food insecurity:    Worry: Not on file    Inability: Not on file  . Transportation needs:    Medical: Not on file    Non-medical: Not  on file  Tobacco Use  . Smoking status: Current Every Day Smoker    Packs/day: 0.50    Years: 60.00    Pack years: 30.00    Types: Cigarettes  . Smokeless tobacco: Former Systems developer    Types: Snuff, Chew  Substance and Sexual Activity  . Alcohol use: Yes    Alcohol/week: 5.0 standard drinks    Types: 5 Shots of liquor per week    Comment: 07/18/2018 Drinks 1/2 pint of gin over the weekend but does not drink everyday. Denies hx of blackouts, seizures, tremors.  . Drug use: No  . Sexual activity: Not Currently    Birth control/protection: None  Lifestyle  . Physical activity:    Days per week: Not on file    Minutes per session: Not on file  . Stress: Not on file  Relationships  . Social connections:    Talks on phone: Not on file    Gets together: Not  on file    Attends religious service: Not on file    Active member of club or organization: Not on file    Attends meetings of clubs or organizations: Not on file    Relationship status: Not on file  . Intimate partner violence:    Fear of current or ex partner: Not on file    Emotionally abused: Not on file    Physically abused: Not on file    Forced sexual activity: Not on file  Other Topics Concern  . Not on file  Social History Narrative   Married, lives with his wife in [place]. 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 Only one son and one daughter live in [place]. No pets.     Family History  Problem Relation Age of Onset  . Heart disease Father   . COPD Father   . Diabetes Mellitus II Mother   . Healthy Sister   . Healthy Brother   . Healthy Daughter   . Healthy Son   . Healthy Sister   . Healthy Sister   . Healthy Sister   . Healthy Sister   . Healthy Daughter   . Healthy Son   . Healthy Son   . Healthy Son   . Healthy Son   . Healthy Son   . Healthy Son     ROS:  Cardiovascular: _0  chest pain/pressure _1  palpitations _2  SOB lying flat _3  DOE _4  pain in legs while walking _5  pain in legs at rest _6  pain in legs at night _7  non-healing ulcers _8  hx of DVT _9  swelling in legs  Pulmonary: _10  productive cough _11  asthma/wheezing _12  home O2  Neurologic: _13  weakness in _14  arms _15  legs _16  numbness in _17  arms _18  legs _19  hx of CVA _20  mini stroke _21 difficulty speaking or slurred speech _22  temporary loss of vision in one eye _23  dizziness  Hematologic: _24  hx of cancer _25  bleeding problems _26  problems with blood clotting easily  Endocrine:   _27  diabetes _28  thyroid disease  GI _29  vomiting blood _30  blood in stool  GU: _31  CKD/renal failure _32  HD--_33  M/W/F or _34  T/T/S _35  burning with urination _36  blood in urine  Psychiatric: _37  anxiety _38  depression  Musculoskeletal: _39  arthritis _40  joint pain  Integumentary: _41   rashes _42  ulcers  Constitutional: _43  fever _44  chills   Physical Examination  Vitals:   02/22/2019 1553 02/28/2019 1743  BP: (!) 149/81   Pulse: (!) 116   Resp: 14   Temp: 98.9  F (37.2 C) (!) 103.4 F (39.7 C)  SpO2: 100%    Body mass index is 27.57 kg/m.  General:  nad HENT: WNL, normocephalic Pulmonary: normal non-labored breathing Cardiac: palpable femoral pulses bilaterally, dressing in left groin Abdomen:  soft, NT/ND, no masses Extremities:  Right foot has fourth toe dry gangrene with blistering to the medial aspect of the foot Neurologic: A&O X 3; Appropriate Affect ; SENSATION: normal; MOTOR FUNCTION:  moving all extremities equally. Speech is fluent/normal   CBC    Component Value Date/Time   WBC 18.6 (H) 02/21/2019 1643   RBC 2.55 (L) 02/27/2019 1643   HGB 7.8 (L) 03/12/2019 1643   HGB 11.2 (L) 09/25/2018 1249   HGB 8.9 (L) 07/11/2018 1637   HCT 25.4 (L) 03/19/2019 1643   HCT 27.8 (L) 07/11/2018 1637   PLT 373 03/05/2019 1643   PLT 181 09/25/2018 1249   PLT 122 (L) 07/11/2018 1637   MCV 99.6 02/24/2019 1643   MCV 88 07/11/2018 1637   MCH 30.6 02/25/2019 1643   MCHC 30.7 03/01/2019 1643   RDW 14.4 03/01/2019 1643   RDW 18.2 (H) 07/11/2018 1637   LYMPHSABS 1.4 03/01/2019 1643   LYMPHSABS 1.4 08/05/2016 1115   MONOABS 1.2 (H) 03/13/2019 1643   EOSABS 0.0 02/17/2019 1643   EOSABS 0.0 08/05/2016 1115   BASOSABS 0.0 02/27/2019 1643   BASOSABS 0.0 08/05/2016 1115    BMET    Component Value Date/Time   NA 134 (L) 03/06/2019 1643   NA 144 07/11/2018 1637   K 3.9 02/26/2019 1643   CL 93 (L) 02/26/2019 1643   CO2 23 02/25/2019 1643   GLUCOSE 113 (H) 03/10/2019 1643   BUN 34 (H) 03/13/2019 1643   BUN 56 (H) 07/11/2018 1637   CREATININE 6.80 (H) 02/21/2019 1643   CREATININE 1.54 (H) 05/28/2014 1046   CALCIUM 8.9 03/17/2019 1643   GFRNONAA 7 (L) 02/26/2019 1643   GFRNONAA 46 (L) 05/28/2014 1046   GFRAA 8 (L) 03/16/2019 1643   GFRAA 53 (L) 05/28/2014  1046    COAGS: Lab Results  Component Value Date   INR 1.18 07/19/2018   INR 1.0 03/09/2015   INR 2.00 06/23/2014     Non-Invasive Imaging:    EXAM: RIGHT FOOT COMPLETE - 3+ VIEW  COMPARISON:  None.  FINDINGS: There is no focal osseous abnormality. No osteolysis, specifically. No visible soft tissue ulceration. No soft tissue emphysema.  IMPRESSION: No radiographic evidence of osteomyelitis.   ASSESSMENT/PLAN: This is a 73 y.o. male with non-reconstructable right lower extremity vascular disease in need of above knee amputation. I re-reviewed his previous angiogram performed here and he has sfa occlusion on the right with reconstitution of a very small distal pt artery. There is similar disease on the left.  I discussed with him that my best recommendation would be above-knee amputation on the right and protecting the left foot at this time which currently has no ulceration evident.  Please keep patient n.p.o. after midnight and we will possibly proceed with right above-knee amputation tomorrow.  Lyndsey Demos C. Donzetta Matters, MD [facility] Office: [phone] Pager: [phone]

## 2019-03-07 NOTE — ED Triage Notes (Signed)
Pt here for evaluation of R leg pain. Sts he was seen her on 3/5 and was told that his leg has to be amputated. Pt unable to tolerate pain and is unable to get around on his own now. Pt MWF dialysis pt, completed whole tx yesterday.

## 2019-03-08 ENCOUNTER — Inpatient Hospital Stay (HOSPITAL_COMMUNITY): Payer: Medicare HMO | Admitting: Anesthesiology

## 2019-03-08 ENCOUNTER — Encounter (HOSPITAL_COMMUNITY): Admission: EM | Disposition: E | Payer: Self-pay | Source: Home / Self Care | Attending: Internal Medicine

## 2019-03-08 ENCOUNTER — Encounter (HOSPITAL_COMMUNITY): Payer: Self-pay | Admitting: Critical Care Medicine

## 2019-03-08 DIAGNOSIS — R509 Fever, unspecified: Secondary | ICD-10-CM | POA: Diagnosis present

## 2019-03-08 DIAGNOSIS — E1151 Type 2 diabetes mellitus with diabetic peripheral angiopathy without gangrene: Secondary | ICD-10-CM

## 2019-03-08 DIAGNOSIS — R740 Nonspecific elevation of levels of transaminase and lactic acid dehydrogenase [LDH]: Secondary | ICD-10-CM

## 2019-03-08 DIAGNOSIS — I998 Other disorder of circulatory system: Secondary | ICD-10-CM

## 2019-03-08 DIAGNOSIS — Z720 Tobacco use: Secondary | ICD-10-CM

## 2019-03-08 DIAGNOSIS — D649 Anemia, unspecified: Secondary | ICD-10-CM

## 2019-03-08 DIAGNOSIS — Z9862 Peripheral vascular angioplasty status: Secondary | ICD-10-CM

## 2019-03-08 DIAGNOSIS — I5022 Chronic systolic (congestive) heart failure: Secondary | ICD-10-CM

## 2019-03-08 DIAGNOSIS — R7401 Elevation of levels of liver transaminase levels: Secondary | ICD-10-CM | POA: Diagnosis present

## 2019-03-08 HISTORY — PX: AMPUTATION: SHX166

## 2019-03-08 LAB — GLUCOSE, CAPILLARY
Glucose-Capillary: 96 mg/dL (ref 70–99)
Glucose-Capillary: 140 mg/dL — ABNORMAL HIGH (ref 70–99)
Glucose-Capillary: 142 mg/dL — ABNORMAL HIGH (ref 70–99)
Glucose-Capillary: 156 mg/dL — ABNORMAL HIGH (ref 70–99)
Glucose-Capillary: 161 mg/dL — ABNORMAL HIGH (ref 70–99)
Glucose-Capillary: 87 mg/dL (ref 70–99)

## 2019-03-08 LAB — COMPREHENSIVE METABOLIC PANEL
ALT: 72 U/L — ABNORMAL HIGH (ref 0–44)
AST: 166 U/L — ABNORMAL HIGH (ref 15–41)
Albumin: 2.2 g/dL — ABNORMAL LOW (ref 3.5–5.0)
Alkaline Phosphatase: 69 U/L (ref 38–126)
Anion gap: 19 — ABNORMAL HIGH (ref 5–15)
BUN: 42 mg/dL — ABNORMAL HIGH (ref 8–23)
CO2: 18 mmol/L — ABNORMAL LOW (ref 22–32)
Calcium: 8.1 mg/dL — ABNORMAL LOW (ref 8.9–10.3)
Chloride: 98 mmol/L (ref 98–111)
Creatinine, Ser: 7.76 mg/dL — ABNORMAL HIGH (ref 0.61–1.24)
GFR calc Af Amer: 7 mL/min — ABNORMAL LOW (ref >60)
GFR calc non Af Amer: 6 mL/min — ABNORMAL LOW (ref >60)
Glucose, Bld: 80 mg/dL (ref 70–99)
POTASSIUM: 4.8 mmol/L (ref 3.5–5.1)
Sodium: 135 mmol/L (ref 135–145)
Total Bilirubin: 0.6 mg/dL (ref 0.3–1.2)
Total Protein: 7.8 g/dL (ref 6.5–8.1)

## 2019-03-08 LAB — CBC
HCT: 22.9 % — ABNORMAL LOW (ref 39.0–52.0)
Hemoglobin: 7.1 g/dL — ABNORMAL LOW (ref 13.0–17.0)
MCH: 30.9 pg (ref 26.0–34.0)
MCHC: 31 g/dL (ref 30.0–36.0)
MCV: 99.6 fL (ref 80.0–100.0)
Platelets: 377 10*3/uL (ref 150–400)
RBC: 2.3 MIL/uL — ABNORMAL LOW (ref 4.22–5.81)
RDW: 14.4 % (ref 11.5–15.5)
WBC: 14.3 10*3/uL — ABNORMAL HIGH (ref 4.0–10.5)
nRBC: 0 % (ref 0.0–0.2)

## 2019-03-08 LAB — SURGICAL PCR SCREEN
MRSA, PCR: NEGATIVE
Staphylococcus aureus: NEGATIVE

## 2019-03-08 LAB — RESPIRATORY PANEL BY PCR

## 2019-03-08 LAB — IRON AND TIBC
IRON: 23 ug/dL — AB (ref 45–182)
Saturation Ratios: 16 % — ABNORMAL LOW (ref 17.9–39.5)
TIBC: 144 ug/dL — AB (ref 250–450)
UIBC: 121 ug/dL

## 2019-03-08 LAB — C-REACTIVE PROTEIN: CRP: 34.4 mg/dL — ABNORMAL HIGH (ref <1.0)

## 2019-03-08 LAB — INFLUENZA PANEL BY PCR (TYPE A & B)
Influenza A By PCR: NEGATIVE
Influenza B By PCR: NEGATIVE

## 2019-03-08 LAB — RETICULOCYTES
Immature Retic Fract: 26.9 % — ABNORMAL HIGH (ref 2.3–15.9)
RBC.: 2.33 MIL/uL — ABNORMAL LOW (ref 4.22–5.81)
Retic Count, Absolute: 44.7 10*3/uL (ref 19.0–186.0)
Retic Ct Pct: 1.9 % (ref 0.4–3.1)

## 2019-03-08 LAB — LACTATE DEHYDROGENASE: LDH: 342 U/L — ABNORMAL HIGH (ref 98–192)

## 2019-03-08 LAB — FERRITIN: Ferritin: 6124 ng/mL — ABNORMAL HIGH (ref 24–336)

## 2019-03-08 SURGERY — AMPUTATION, ABOVE KNEE
Anesthesia: General | Site: Leg Upper | Laterality: Right

## 2019-03-08 MED ORDER — ONDANSETRON HCL 4 MG/2ML IJ SOLN
INTRAMUSCULAR | Status: DC | PRN
  Administered 2019-03-08: 4 mg via INTRAVENOUS

## 2019-03-08 MED ORDER — GLYCOPYRROLATE PF 0.2 MG/ML IJ SOSY
PREFILLED_SYRINGE | INTRAMUSCULAR | Status: AC
  Filled 2019-03-08: qty 1

## 2019-03-08 MED ORDER — DEXAMETHASONE SODIUM PHOSPHATE 10 MG/ML IJ SOLN
INTRAMUSCULAR | Status: DC | PRN
  Administered 2019-03-08: 10 mg via INTRAVENOUS

## 2019-03-08 MED ORDER — DEXTROSE 50 % IV SOLN
50.0000 mL | Freq: Once | INTRAVENOUS | Status: AC
  Administered 2019-03-08: 50 mL via INTRAVENOUS

## 2019-03-08 MED ORDER — ROCURONIUM BROMIDE 50 MG/5ML IV SOSY
PREFILLED_SYRINGE | INTRAVENOUS | Status: DC | PRN
  Administered 2019-03-08: 20 mg via INTRAVENOUS

## 2019-03-08 MED ORDER — ONDANSETRON HCL 4 MG/2ML IJ SOLN
INTRAMUSCULAR | Status: AC
  Filled 2019-03-08: qty 2

## 2019-03-08 MED ORDER — ROCURONIUM BROMIDE 50 MG/5ML IV SOSY
PREFILLED_SYRINGE | INTRAVENOUS | Status: AC
  Filled 2019-03-08: qty 5

## 2019-03-08 MED ORDER — PROPOFOL 10 MG/ML IV BOLUS
INTRAVENOUS | Status: DC | PRN
  Administered 2019-03-08: 70 mg via INTRAVENOUS

## 2019-03-08 MED ORDER — LIDOCAINE 2% (20 MG/ML) 5 ML SYRINGE
INTRAMUSCULAR | Status: AC
  Filled 2019-03-08: qty 5

## 2019-03-08 MED ORDER — DEXAMETHASONE SODIUM PHOSPHATE 10 MG/ML IJ SOLN
INTRAMUSCULAR | Status: AC
  Filled 2019-03-08: qty 1

## 2019-03-08 MED ORDER — LIDOCAINE 2% (20 MG/ML) 5 ML SYRINGE
INTRAMUSCULAR | Status: DC | PRN
  Administered 2019-03-08: 50 mg via INTRAVENOUS

## 2019-03-08 MED ORDER — FENTANYL CITRATE (PF) 250 MCG/5ML IJ SOLN
INTRAMUSCULAR | Status: AC
  Filled 2019-03-08: qty 5

## 2019-03-08 MED ORDER — VANCOMYCIN VARIABLE DOSE PER UNSTABLE RENAL FUNCTION (PHARMACIST DOSING): Status: DC

## 2019-03-08 MED ORDER — SODIUM CHLORIDE 0.9% IV SOLUTION: Freq: Once | INTRAVENOUS | Status: DC

## 2019-03-08 MED ORDER — PROPOFOL 10 MG/ML IV BOLUS
INTRAVENOUS | Status: AC
  Filled 2019-03-08: qty 20

## 2019-03-08 MED ORDER — SUCCINYLCHOLINE CHLORIDE 200 MG/10ML IV SOSY
PREFILLED_SYRINGE | INTRAVENOUS | Status: DC | PRN
  Administered 2019-03-08: 100 mg via INTRAVENOUS

## 2019-03-08 MED ORDER — 0.9 % SODIUM CHLORIDE (POUR BTL) OPTIME
TOPICAL | Status: DC | PRN
  Administered 2019-03-08: 1000 mL

## 2019-03-08 MED ORDER — VANCOMYCIN HCL IN DEXTROSE 750-5 MG/150ML-% IV SOLN
INTRAVENOUS | Status: AC
  Administered 2019-03-08: 750 mg via INTRAVENOUS
  Filled 2019-03-08: qty 150

## 2019-03-08 MED ORDER — SUCCINYLCHOLINE CHLORIDE 200 MG/10ML IV SOSY
PREFILLED_SYRINGE | INTRAVENOUS | Status: AC
  Filled 2019-03-08: qty 10

## 2019-03-08 MED ORDER — FENTANYL CITRATE (PF) 100 MCG/2ML IJ SOLN
INTRAMUSCULAR | Status: DC | PRN
  Administered 2019-03-08: 25 ug via INTRAVENOUS
  Administered 2019-03-08: 50 ug via INTRAVENOUS
  Administered 2019-03-08: 100 ug via INTRAVENOUS

## 2019-03-08 MED ORDER — GLYCOPYRROLATE PF 0.2 MG/ML IJ SOSY
PREFILLED_SYRINGE | INTRAMUSCULAR | Status: DC | PRN
  Administered 2019-03-08: 0.4 mg via INTRAVENOUS

## 2019-03-08 MED ORDER — CHLORHEXIDINE GLUCONATE CLOTH 2 % EX PADS: 6.0000 | MEDICATED_PAD | Freq: Every day | CUTANEOUS | Status: DC

## 2019-03-08 MED ORDER — NEOSTIGMINE METHYLSULFATE 3 MG/3ML IV SOSY
PREFILLED_SYRINGE | INTRAVENOUS | Status: DC | PRN
  Administered 2019-03-08: 3 mg via INTRAVENOUS

## 2019-03-08 MED ORDER — VANCOMYCIN HCL IN DEXTROSE 750-5 MG/150ML-% IV SOLN
750.0000 mg | INTRAVENOUS | Status: DC
  Administered 2019-03-08: 750 mg via INTRAVENOUS
  Filled 2019-03-08: qty 150

## 2019-03-08 MED ORDER — SODIUM CHLORIDE 0.9 % IV SOLN
2.0000 g | Freq: Every day | INTRAVENOUS | Status: DC
  Administered 2019-03-09: 2 g via INTRAVENOUS
  Filled 2019-03-08 (×2): qty 20

## 2019-03-08 MED ORDER — SODIUM CHLORIDE 0.9 % IV SOLN
INTRAVENOUS | Status: DC | PRN
  Administered 2019-03-08: 30 ug/min via INTRAVENOUS

## 2019-03-08 MED ORDER — SODIUM CHLORIDE 0.9 % IV SOLN
INTRAVENOUS | Status: DC | PRN
  Administered 2019-03-08: 13:00:00 via INTRAVENOUS

## 2019-03-08 MED ORDER — DEXTROSE 50 % IV SOLN
INTRAVENOUS | Status: AC
  Filled 2019-03-08: qty 50

## 2019-03-08 MED ORDER — DARBEPOETIN ALFA 200 MCG/0.4ML IJ SOSY
200.0000 ug | PREFILLED_SYRINGE | INTRAMUSCULAR | Status: DC
  Administered 2019-03-08: 200 ug via SUBCUTANEOUS
  Filled 2019-03-08: qty 0.4

## 2019-03-08 MED ORDER — DARBEPOETIN ALFA 200 MCG/0.4ML IJ SOSY
PREFILLED_SYRINGE | INTRAMUSCULAR | Status: AC
  Administered 2019-03-08: 200 ug via SUBCUTANEOUS
  Filled 2019-03-08: qty 0.4

## 2019-03-08 MED ORDER — HEPARIN SODIUM (PORCINE) 1000 UNIT/ML DIALYSIS
3500.0000 [IU] | INTRAMUSCULAR | Status: DC | PRN
  Filled 2019-03-08: qty 4

## 2019-03-08 MED ORDER — NEOSTIGMINE METHYLSULFATE 3 MG/3ML IV SOSY
PREFILLED_SYRINGE | INTRAVENOUS | Status: AC
  Filled 2019-03-08: qty 3

## 2019-03-08 SURGICAL SUPPLY — 53 items
BANDAGE ACE 4X5 VEL STRL LF (GAUZE/BANDAGES/DRESSINGS) ×3 IMPLANT
BANDAGE ACE 6X5 VEL STRL LF (GAUZE/BANDAGES/DRESSINGS) ×3 IMPLANT
BANDAGE ELASTIC 4 VELCRO ST LF (GAUZE/BANDAGES/DRESSINGS) ×4 IMPLANT
BANDAGE ESMARK 6X9 LF (GAUZE/BANDAGES/DRESSINGS) IMPLANT
BLADE SAW GIGLI 510 (BLADE) ×2 IMPLANT
BLADE SAW GIGLI 510MM (BLADE) ×1
BNDG CMPR 9X6 STRL LF SNTH (GAUZE/BANDAGES/DRESSINGS)
BNDG ESMARK 6X9 LF (GAUZE/BANDAGES/DRESSINGS)
BNDG GAUZE ELAST 4 BULKY (GAUZE/BANDAGES/DRESSINGS) ×4 IMPLANT
CANISTER SUCT 3000ML PPV (MISCELLANEOUS) ×3 IMPLANT
CLIP LIGATING EXTRA MED SLVR (CLIP) ×3 IMPLANT
CLIP LIGATING EXTRA SM BLUE (MISCELLANEOUS) ×1 IMPLANT
CLIP VESOCCLUDE MED 6/CT (CLIP) ×2 IMPLANT
COVER SURGICAL LIGHT HANDLE (MISCELLANEOUS) ×6 IMPLANT
COVER WAND RF STERILE (DRAPES) ×3 IMPLANT
CUFF TOURNIQUET SINGLE 34IN LL (TOURNIQUET CUFF) IMPLANT
CUFF TOURNIQUET SINGLE 44IN (TOURNIQUET CUFF) IMPLANT
DRAIN SNY 10X20 3/4 PERF (WOUND CARE) IMPLANT
DRAPE HALF SHEET 40X57 (DRAPES) ×3 IMPLANT
DRAPE ORTHO SPLIT 77X108 STRL (DRAPES) ×6
DRAPE SURG ORHT 6 SPLT 77X108 (DRAPES) ×2 IMPLANT
ELECT CAUTERY BLADE 6.4 (BLADE) ×3 IMPLANT
ELECT REM PT RETURN 9FT ADLT (ELECTROSURGICAL) ×3
ELECTRODE REM PT RTRN 9FT ADLT (ELECTROSURGICAL) ×1 IMPLANT
EVACUATOR SILICONE 100CC (DRAIN) IMPLANT
GAUZE SPONGE 4X4 12PLY STRL (GAUZE/BANDAGES/DRESSINGS) ×6 IMPLANT
GAUZE SPONGE 4X4 12PLY STRL LF (GAUZE/BANDAGES/DRESSINGS) ×2 IMPLANT
GAUZE XEROFORM 5X9 LF (GAUZE/BANDAGES/DRESSINGS) ×3 IMPLANT
GLOVE BIO SURGEON STRL SZ7.5 (GLOVE) ×2 IMPLANT
GLOVE SS BIOGEL STRL SZ 7.5 (GLOVE) ×1 IMPLANT
GLOVE SUPERSENSE BIOGEL SZ 7.5 (GLOVE) ×2
GOWN STRL REUS W/ TWL LRG LVL3 (GOWN DISPOSABLE) ×3 IMPLANT
GOWN STRL REUS W/TWL LRG LVL3 (GOWN DISPOSABLE) ×9
KIT BASIN OR (CUSTOM PROCEDURE TRAY) ×3 IMPLANT
KIT TURNOVER KIT B (KITS) ×3 IMPLANT
NS IRRIG 1000ML POUR BTL (IV SOLUTION) ×1 IMPLANT
PACK GENERAL/GYN (CUSTOM PROCEDURE TRAY) ×3 IMPLANT
PAD ARMBOARD 7.5X6 YLW CONV (MISCELLANEOUS) ×6 IMPLANT
PADDING CAST COTTON 6X4 STRL (CAST SUPPLIES) IMPLANT
STAPLER VISISTAT 35W (STAPLE) ×3 IMPLANT
STOCKINETTE IMPERVIOUS LG (DRAPES) ×3 IMPLANT
SUT ETHILON 3 0 PS 1 (SUTURE) IMPLANT
SUT SILK 2 0 (SUTURE) ×3
SUT SILK 2-0 18XBRD TIE 12 (SUTURE) IMPLANT
SUT SILK 3 0 (SUTURE) ×3
SUT SILK 3-0 18XBRD TIE 12 (SUTURE) IMPLANT
SUT VIC AB 0 CT1 18XCR BRD 8 (SUTURE) ×2 IMPLANT
SUT VIC AB 0 CT1 8-18 (SUTURE)
SUT VIC AB 2-0 CT1 18 (SUTURE) ×4 IMPLANT
SUT VICRYL AB 2 0 TIES (SUTURE) ×1 IMPLANT
TOWEL GREEN STERILE (TOWEL DISPOSABLE) ×6 IMPLANT
UNDERPAD 30X30 (UNDERPADS AND DIAPERS) ×3 IMPLANT
WATER STERILE IRR 1000ML POUR (IV SOLUTION) ×3 IMPLANT

## 2019-03-08 NOTE — Progress Notes (Signed)
Nurse reports temp of 103 and states that she is giving Tylenol per protocol.  Dr. Donnetta Hutching made aware.

## 2019-03-08 NOTE — Progress Notes (Signed)
Hypoglycemic Event  CBG: 12  Treatment: D50 50 mL (25 gm)  Symptoms: None  Follow-up CBG: [id number] CBG Result:140  Possible Reasons for Event: Unknown  Comments/MD notified:Dr. Serafina Mitchell

## 2019-03-08 NOTE — Anesthesia Preprocedure Evaluation (Signed)
Anesthesia Evaluation    Airway Mallampati: II  TM Distance: >3 FB Neck ROM: Full    Dental  (+) Dental Advisory Given   Pulmonary pneumonia, COPD, Current Smoker,    Pulmonary exam normal breath sounds clear to auscultation       Cardiovascular hypertension, + CAD, + CABG, + Peripheral Vascular Disease and +CHF   Rhythm:Regular Rate:Normal + Systolic murmurs Left ventricle: The cavity size was mildly dilated. Systolic   function was mildly to moderately reduced. The estimated ejection   fraction was in the range of 40% to 45%. There is mild   hypokinesis of the entireanterior and inferior myocardium. - Aortic valve: Transvalvular velocity was increased, due to   stenosis. There was mild stenosis. - Mitral valve: There was moderate regurgitation. - Left atrium: The atrium was moderately to severely dilated. - Right ventricle: Systolic function was mildly to moderately   reduced. - Right atrium: The atrium was moderately dilated. - Tricuspid valve: There was moderate regurgitation   Neuro/Psych PSYCHIATRIC DISORDERS Depression    GI/Hepatic (+)     substance abuse  alcohol use,   Endo/Other  diabetes  Renal/GU DialysisRenal disease     Musculoskeletal   Abdominal   Peds  Hematology  (+) Blood dyscrasia, anemia , JEHOVAH'S WITNESS  Anesthesia Other Findings   Reproductive/Obstetrics                             Anesthesia Physical  Anesthesia Plan  ASA: V  Anesthesia Plan: General   Post-op Pain Management:    Induction: Intravenous  PONV Risk Score and Plan: 3 and Ondansetron  Airway Management Planned: Oral ETT  Additional Equipment:   Intra-op Plan:   Post-operative Plan: Possible Post-op intubation/ventilation  Informed Consent: I have reviewed the patients History and Physical, chart, labs and discussed the procedure including the risks, benefits and alternatives for  the proposed anesthesia with the patient or authorized representative who has indicated his/her understanding and acceptance.     Dental advisory given  Plan Discussed with: CRNA  Anesthesia Plan Comments: (+/- aline and CVL)       Anesthesia Quick Evaluation

## 2019-03-08 NOTE — Progress Notes (Addendum)
   Subjective: Kevin Castillo reports feeling okay this morning. He continues to have pain in his right leg. He states he got back from [place] two days ago. He denies any respiratory symptoms such as cough, shortness of breath or chest tightness. He denies any fevers prior to coming to the hospital. All questions and concerns addressed   Objective:  Vital signs in last 24 hours: Vitals:   02/27/2019 2205 02/21/2019 2206 02/25/2019 0643 02/24/2019 0900  BP: (!) 135/54  (!) 146/54   Pulse: 93  (!) 115   Resp: 18  (!) 21   Temp: 99.3 F (37.4 C)  (!) 100.4 F (38 C) (!) 103 F (39.4 C)  TempSrc: Oral   Oral  SpO2: 100%     Weight:  75.1 kg 75.8 kg   Height:       Physical Exam Gen: comfortably lying in bed, no distress CV: RRR, no murmurs Abdomen: soft, nontender, bowel sounds present Ext: right LE cool to touch with blistering to the dorsum of the foot, bilateral palpable pulses, left LE warm to touch however distal toes are cool to touch    Assessment/Plan:  Active Problems:   Critical lower limb ischemia  Kevin Castillo presents with leg pain secondary to right lower extremity vascular disease.  He has been seen by vascular surgery who recommends an above-the-knee amputation.   Right lower limb ischemia with potential soft tissue infection: Appreciate vascular surgery recommendations. Surgery is on hold until respiratory panel and influenza panel return.  - Continue vancomycin and ceftriaxone - Hydromorphone 0.5 mg q4h PRN pain - Blood cultures pending  Fever: On admission patient had a rectal temperature of 103.4. He had two more documented fevers this morning of 100.4 and 103. Given his recent travel history to [place] there is concern for COVID-19. However, this may also be likely due to his right LE infection. Surgery is on hold. Ordered a respiratory and influenza panel. If these are negative will consider COVID testing and an infectious workup. Discussed case with ID and they do not  recommend proceeding with COVID testing due to lack of respiratory symptoms. - Respiratory and influenza panel - Blood cultures   ESRD on HD (MWF): Last dialysis session Wednesday [facility] nephrology for inpatient dialysis.  Normocytic Anemia: Hb 7.8 -> 6.6 (11.2 three weeks ago). Unclear etiology. No signs of active bleeding. 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 - Iron, TIBC and saturation rations low  - Ferritin elevated  - Most likely multifactorial due to chronic disease and infection, will need to follow after infection improves  - Recheck CBC this afternoon  Dispo: Anticipated discharge pending AKA.   Mike Craze, DO 03/10/2019, 10:00 AM Pager: [phone]

## 2019-03-08 NOTE — Progress Notes (Signed)
  Progress Note    03/01/2019 8:37 AM Day of Surgery  Subjective: Having right foot pain  Vitals:   03/13/2019 2205 03/19/2019 0643  BP: (!) 135/54 (!) 146/54  Pulse: 93 (!) 115  Resp: 18 (!) 21  Temp: 99.3 F (37.4 C) (!) 100.4 F (38 C)  SpO2: 100%     Physical Exam: Awake alert oriented Nonlabored respirations Palpable right femoral pulse Right foot with stable ischemic changes  CBC    Component Value Date/Time   WBC 18.6 (H) 02/17/2019 1643   RBC 2.55 (L) 03/19/2019 1643   HGB 7.8 (L) 03/12/2019 1643   HGB 11.2 (L) 09/25/2018 1249   HGB 8.9 (L) 07/11/2018 1637   HCT 25.4 (L) 02/19/2019 1643   HCT 27.8 (L) 07/11/2018 1637   PLT 373 02/22/2019 1643   PLT 181 09/25/2018 1249   PLT 122 (L) 07/11/2018 1637   MCV 99.6 02/26/2019 1643   MCV 88 07/11/2018 1637   MCH 30.6 03/19/2019 1643   MCHC 30.7 02/27/2019 1643   RDW 14.4 02/23/2019 1643   RDW 18.2 (H) 07/11/2018 1637   LYMPHSABS 1.4 03/01/2019 1643   LYMPHSABS 1.4 08/05/2016 1115   MONOABS 1.2 (H) 02/18/2019 1643   EOSABS 0.0 03/03/2019 1643   EOSABS 0.0 08/05/2016 1115   BASOSABS 0.0 03/02/2019 1643   BASOSABS 0.0 08/05/2016 1115    BMET    Component Value Date/Time   NA 135 03/13/2019 0337   NA 144 07/11/2018 1637   K 4.8 03/03/2019 0337   CL 98 03/13/2019 0337   CO2 18 (L) 03/02/2019 0337   GLUCOSE 80 02/27/2019 0337   BUN 42 (H) 02/20/2019 0337   BUN 56 (H) 07/11/2018 1637   CREATININE 7.76 (H) 02/17/2019 0337   CREATININE 1.54 (H) 05/28/2014 1046   CALCIUM 8.1 (L) 03/02/2019 0337   GFRNONAA 6 (L) 02/28/2019 0337   GFRNONAA 46 (L) 05/28/2014 1046   GFRAA 7 (L) 02/21/2019 0337   GFRAA 53 (L) 05/28/2014 1046    INR    Component Value Date/Time   INR 1.18 07/19/2018 0443     Intake/Output Summary (Last 24 hours) at 02/26/2019 0837 Last data filed at 02/17/2019 0600 Gross per 24 hour  Intake 651.43 ml  Output -  Net 651.43 ml     Assessment:  73 y.o. male is here with  non-reconstructable peripheral vascular disease and tissue loss of his right foot.  Plan: Right above-knee amputation today with Dr. Donnetta Hutching.   Plez Belton C. Donzetta Matters, MD [facility] Office: [phone] Pager: [phone]  02/24/2019 8:37 AM

## 2019-03-08 NOTE — Anesthesia Procedure Notes (Signed)
Procedure Name: Intubation Date/Time: 03/07/2019 12:57 PM Performed by: Genelle Bal, CRNA Pre-anesthesia Checklist: Patient identified, Emergency Drugs available, Suction available and Patient being monitored Patient Re-evaluated:Patient Re-evaluated prior to induction Oxygen Delivery Method: Circle system utilized Preoxygenation: Pre-oxygenation with 100% oxygen Induction Type: IV induction and Rapid sequence Laryngoscope Size: Miller and 2 Grade View: Grade I Tube type: Oral Tube size: 7.5 mm Number of attempts: 1 Airway Equipment and Method: Stylet Placement Confirmation: ETT inserted through vocal cords under direct vision,  positive ETCO2 and breath sounds checked- equal and bilateral Secured at: 23 cm Tube secured with: Tape Dental Injury: Teeth and Oropharynx as per pre-operative assessment

## 2019-03-08 NOTE — Consult Note (Addendum)
Renal Service Consult Note [facility]  Kevin Humm Sr. 02/28/2019 Sol Blazing Requesting Physician:  Dr Rebeca Alert  Reason for Consult:  ESRD pt w/ ischemic R foot HPI: The patient is a 73 y.o. year-old with hx of DVT, DM2, PAD, depression, HTN, CAD sp CABG, COPD, syst CHF, BPH, anemia ckd who presented to ED yesterday with R leg pain in setting of recent RLE angiogram which showed non-reconstructible disease.  He has a high fever and plans are for possibly surgery (RLE amp) today.  We are asked to see for HD.    Patient is in pain and somnolent.  No SOB, cp, no abd pain, no n/v/d. Family says they are using the [facility] and also the R arm AVF at this time in OP HD.  AVF was created in 07/2018.     ROS  denies CP  no joint pain   no HA  no blurry vision  no rash  no diarrhea  no nausea/ vomiting   Past Medical History  Past Medical History:  Diagnosis Date  . Acute left systolic heart failure ([place]) 01/06/2016  . Adenoma of left adrenal gland 06/16/2014   CT Abdomen (06/04/2014): Incidental, 20 mm, 8.33 HU suggesting benign adenoma   . Alcohol abuse 02/15/2007   Reports consuming 1 pint of gin on weekends, does not drink every day. Denies history of seizures, blackouts, or tremors.    . Allergic rhinitis 04/09/2014  . Anemia 11/12/2013   Unclear cause as of yet (? EtOH abuse)   . Anemia of chronic kidney failure, stage 4 (severe) ([place]) 07/28/2017  . Benign prostatic hypertrophy 06/02/2014  . Chronic kidney disease, stage 3 ([place]) 11/12/2013  . Chronic systolic heart failure ([place]) 01/15/2016   Echo (01/07/2016): LVEF 35%  . COPD (chronic obstructive pulmonary disease) ([place])   . Coronary artery disease 02/15/2007   Non-STEMI 07/2005, s/p CABG x 4 (08/01/2005): LIMA to LAD, SVG to OM, RCA, PCA   . DVT (deep venous thrombosis) ([place]) 2006   RLE "when I had OHS"  . Erectile dysfunction associated with type 2 diabetes mellitus ([place]) 02/15/2007  . Essential hypertension 02/15/2007   . Hyperlipidemia 02/15/2007  . Major depression, single episode 11/02/2015  . Obesity (BMI 30.0-34.9) 02/15/2007  . Peripheral vascular occlusive disease ([place]) 12/30/2013   ABI (01/03/2014): Right 0.64, Left 0.64   . Pneumonia X 1  . Secondary hyperparathyroidism of renal origin ([place]) 07/28/2017  . Sigmoid diverticulosis 01/15/2016   Minimal per colonoscopy 01/11/2016  . Tobacco abuse 02/15/2007  . Tubular adenoma of colon 01/11/2016   Two 6 mm semi-pedunculated tubular adenomas excised endoscopically 01/11/2016.  Repeat colonoscopy due 12/2020.  . Type 2 diabetes mellitus with mild nonproliferative diabetic retinopathy with macular edema, bilateral ([place]) 10/28/2016  . Type 2 diabetes mellitus with stage 3 chronic kidney disease ([place]) 02/15/2007  . Venous thromboembolism 11/11/2013   LE Dopplers (01/2011): RLE DVT. CTA with mild subacute to chronic right-sided pulmonary emboli.  Patient has elected to continue anticoagulation.    Past Surgical History  Past Surgical History:  Procedure Laterality Date  . ABDOMINAL AORTOGRAM W/LOWER EXTREMITY N/A 02/14/2019   Procedure: ABDOMINAL AORTOGRAM W/LOWER EXTREMITY;  Surgeon: Waynetta Sandy, MD;  Location: [place] [facility];  Service: Cardiovascular;  Laterality: N/A;  . AV FISTULA PLACEMENT  07/23/2018   Procedure: RIGHT UPPER ARM ARTERIOVENOUS (AV) BRACHIOCEPHALIC FISTULA CREATION;  Surgeon: Conrad Anson, MD;  Location: [place];  Service: Vascular;;  . CARDIAC CATHETERIZATION  07/2005  .  CATARACT EXTRACTION W/ INTRAOCULAR LENS  IMPLANT, BILATERAL Bilateral 04/2014   Left 04/30/2014, Right 05/07/2014  . COLONOSCOPY WITH PROPOFOL N/A 01/11/2016   Procedure: COLONOSCOPY WITH PROPOFOL;  Surgeon: Ronald Lobo, MD;  Location: [place];  Service: Endoscopy;  Laterality: N/A;  Needs MAC.  Marland Kitchen CORONARY ARTERY BYPASS GRAFT  08/01/2005   x 4, LIMA to LAD, SVG to OM, RCA, PCA  . ESOPHAGOGASTRODUODENOSCOPY N/A 01/08/2016   Procedure: ESOPHAGOGASTRODUODENOSCOPY  (EGD);  Surgeon: Wilford Corner, MD;  Location: [facility] ENDOSCOPY;  Service: Endoscopy;  Laterality: N/A;  . GIVENS CAPSULE STUDY N/A 01/12/2016   Procedure: GIVENS CAPSULE STUDY;  Surgeon: Ronald Lobo, MD;  Location: [place];  Service: Endoscopy;  Laterality: N/A;  . INSERTION OF DIALYSIS CATHETER Right 07/23/2018   Procedure: INSERTION OF  DIALYSIS CATHETER - RIGHT INTERNAL JUGULAR PLACEMENT;  Surgeon: Conrad Fulton, MD;  Location: [facility];  Service: Vascular;  Laterality: Right;   Family History  Family History  Problem Relation Age of Onset  . Heart disease Father   . COPD Father   . Diabetes Mellitus II Mother   . Healthy Sister   . Healthy Brother   . Healthy Daughter   . Healthy Son   . Healthy Sister   . Healthy Sister   . Healthy Sister   . Healthy Sister   . Healthy Daughter   . Healthy Son   . Healthy Son   . Healthy Son   . Healthy Son   . Healthy Son   . Healthy Son    Social History  reports that he has been smoking cigarettes. He has a 30.00 pack-year smoking history. He has quit using smokeless tobacco.  His smokeless tobacco use included snuff and chew. He reports current alcohol use of about 5.0 standard drinks of alcohol per week. He reports that he does not use drugs. Allergies  Allergies  Allergen Reactions  . Nicotine Transdermal System [Nicotine] Other (See Comments)    Vivid dreams   Home medications Prior to Admission medications   Medication Sig Start Date End Date Taking? Authorizing Provider  allopurinol (ZYLOPRIM) 100 MG tablet Take 1 tablet (100 mg total) by mouth daily. 07/27/18  Yes Dixie Dials, MD  atorvastatin (LIPITOR) 20 MG tablet Take 1 tablet (20 mg total) by mouth daily. 03/02/18  Yes Oval Linsey, MD  B Complex-C-Zn-Folic Acid (DIALYVITE/ZINC) TABS Take 1 tablet by mouth 2 (two) times daily.  08/03/18  Yes [provider]  finasteride (PROSCAR) 5 MG tablet Take 1 tablet (5 mg total) by mouth daily. 06/01/18  Yes Oval Linsey, MD  lidocaine-prilocaine (EMLA) cream Apply 1 application topically as needed (topical anesthesia for hemodialysis if Gebauers and Lidocaine injection are ineffective.). 07/26/18  Yes Dixie Dials, MD  Darbepoetin Alfa (ARANESP) 100 MCG/0.5ML SOSY injection Inject 0.5 mLs (100 mcg total) into the vein every Thursday with hemodialysis. 08/02/18   Dixie Dials, MD  ferric gluconate 250 mg in sodium chloride 0.9 % 100 mL Inject 250 mg into the vein every Monday, Wednesday, and Friday with hemodialysis. 07/27/18   Dixie Dials, MD  heparin 1000 unit/mL SOLN injection 1 mL (1,000 Units total) by Dialysis route as needed (in dialysis). 07/26/18   Dixie Dials, MD  pentafluoroprop-tetrafluoroeth Landry Dyke) AERO Apply 1 application topically as needed (topical anesthesia for hemodialysis). 07/26/18   Dixie Dials, MD   Liver Function Tests Recent Labs  Lab 03/04/2019 1643 02/28/2019 0337  AST 191* 166*  ALT 73* 72*  ALKPHOS 68 69  BILITOT 0.5 0.6  PROT 8.3* 7.8  ALBUMIN 2.5* 2.2*   No results for input(s): LIPASE, AMYLASE in the last 168 hours. CBC Recent Labs  Lab 03/04/2019 1643 03/13/2019 0944  WBC 18.6* 14.2*  NEUTROABS 15.9*  --   HGB 7.8* 6.6*  HCT 25.4* 21.2*  MCV 99.6 98.6  PLT 373 517   Basic Metabolic Panel Recent Labs  Lab 03/13/2019 1643 03/17/2019 0337  NA 134* 135  K 3.9 4.8  CL 93* 98  CO2 23 18*  GLUCOSE 113* 80  BUN 34* 42*  CREATININE 6.80* 7.76*  CALCIUM 8.9 8.1*   Iron/TIBC/Ferritin/ %Sat    Component Value Date/Time   IRON 23 (L) 02/22/2019 0337   TIBC 144 (L) 03/04/2019 0337   FERRITIN 6,124 (H) 03/11/2019 0337   FERRITIN 52 10/28/2016 1149   IRONPCTSAT 16 (L) 03/06/2019 0337   IRONPCTSAT 19 (L) 11/18/2013 1101    Vitals:   02/23/2019 2206 02/18/2019 0643 02/24/2019 0900 02/20/2019 1000  BP:  (!) 146/54    Pulse:  (!) 115    Resp:  (!) 21    Temp:  (!) 100.4 F (38 C) (!) 103 F (39.4 C) 100 F (37.8 C)  TempSrc:   Oral Oral  SpO2:      Weight: 75.1  kg 75.8 kg    Height:       Exam Gen somnolent, O x3 , follows commands No rash, cyanosis or gangrene Sclera anicteric, throat clear  No jvd or bruits Chest clear bilat to bsaes RRR no MRG Abd soft ntnd no mass or ascites +bs GU normal male  defer MS no joint effusions or deformity Ext no LE or UE edema, no wounds or ulcers Neuro is alert, Ox 3 , nf  R IJ TDC intact/  RUA AVF +bruit, not well matured    Home meds:  - allopurinol 100 qd/ finasteride 5 qd  - atorvastatin 20 qd     MWF  [facility]  4h 71mn   400/800   76kg   2/2 bath  TDC / R BC AVF (created Aug '19, using both)  Heparin 7000  - has not had OP HD since 02/25/19  - M 50 ug every 2 wks, last dose was 01/23/19   Assessment: 1. Ischemic R foot/ fevers - on IV abx, VVS planning amputation 2. ESRD on HD MWF. HD today.  3. Vol / HTN - no sig vol overload on exam. BP's up slightly, no antiHTN'sives at home.  4. H/o DM - not on meds at this time 5. CAD hx CABG 6. Anemia CKD - Hb low 6.8 today, last esa in Feb. Last HD was 3/9, prob missing any ESA ordered. Tsat low 16% here but ^^ferritin and fevers , hold off on IV iron for now.  Start aranesp 200 ug per week.  Give 1 unit prbc on HD today.     Plan: 1. As above.       [facility] 02/18/2019, 10:38 AM

## 2019-03-08 NOTE — Progress Notes (Signed)
Dialysis unable to take patient at this time. Will notify primary RN.

## 2019-03-08 NOTE — OR Nursing (Signed)
Consulted with infection prevention and Dr. Donnetta Hutching.  In absence of respiratory symptoms, proceed with no precautions.

## 2019-03-08 NOTE — Op Note (Signed)
    Patient name: Kevin Schoonmaker Sr. MRN: [id number] DOB: 1946-11-14 Sex: male  03/13/2019 Pre-operative Diagnosis: Ischemic right leg Post-operative diagnosis:  Same Surgeon:  Annamarie Major Assistants: Laurence Slate Procedure:   Right above-knee amputation Anesthesia: General Blood Loss: 50 cc Specimens: Right leg  Findings: Viable tissue at the level of the amputation site  Indications: The patient has not unreconstructable vascular disease.  Amputation has been recommended.  Procedure:  The patient was identified in the holding area and taken to [place]  The patient was then placed supine on the table. general anesthesia was administered.  The patient was prepped and draped in the usual sterile fashion.  A time out was called and antibiotics were administered.  A fishmouth incision was made in the distal thigh.  Cautery was used about subcutaneous tissue down the fascia which was divided with cautery.  I then divided the muscle and circumferentially exposed the femur.  Periosteal elevator was used to elevate the periosteum.  Next, a Gigli saw was used to transect the femur, beveling the anterior surface.  A rasp was used to smooth the bone surface.  I then isolated the neurovascular bundle and ligated between hemostats.  The remaining muscle was divided with cautery and the leg was removed as a specimen.  I then dissected back the artery and vein until I was proximal to the cut edge of the femur and the artery and vein were ligated together between 8-0 silk tie.  Next the nerve was fully mobilized and clamped proximal to the cut edge of the femur then transected and ligated with a 0 silk tie.  The wound was then copiously irrigated.  Hemostasis was achieved with cautery.  The fascia was reapproximated with interrupted 2-0 Vicryl and skin was closed with staples.  Sterile dressings were applied there were no immediate complications.   Disposition: To PACU stable   V. Annamarie Major, M.D.,  [facility] [place] Office: [phone] Pager:  [phone]

## 2019-03-08 NOTE — Progress Notes (Signed)
  Date: 03/10/2019  Patient name: Kevin Blagg Sr.  Medical record number: [id number]  Date of birth: [birth year]   I have seen and evaluated this patient and I have discussed the plan of care with the house staff. Please see their note for complete details. I concur with their findings with the following additions/corrections:   Please see my separate attestation of the H&P from 03/02/2019.  Going to the OR today for right above-knee amputation.  Lenice Pressman, M.D., Ph.D. 03/09/2019, 2:54 PM

## 2019-03-08 NOTE — Progress Notes (Signed)
Patient received from OR, surgical site Clean and and intact, Patient alert, denies pain and SOB. Vital signs stable, BP on the low side, will continue to monitor. CBG is 156.

## 2019-03-09 LAB — CBC
HCT: 21.2 % — ABNORMAL LOW (ref 39.0–52.0)
HCT: 21.4 % — ABNORMAL LOW (ref 39.0–52.0)
Hemoglobin: 6.6 g/dL — CL (ref 13.0–17.0)
Hemoglobin: 6.6 g/dL — CL (ref 13.0–17.0)
MCH: 30.7 pg (ref 26.0–34.0)
MCH: 30.4 pg (ref 26.0–34.0)
MCHC: 31.1 g/dL (ref 30.0–36.0)
MCHC: 30.8 g/dL (ref 30.0–36.0)
MCV: 98.6 fL (ref 80.0–100.0)
MCV: 98.6 fL (ref 80.0–100.0)
Platelets: 337 K/uL (ref 150–400)
Platelets: 326 10*3/uL (ref 150–400)
RBC: 2.15 MIL/uL — ABNORMAL LOW (ref 4.22–5.81)
RBC: 2.17 MIL/uL — ABNORMAL LOW (ref 4.22–5.81)
RDW: 14.3 % (ref 11.5–15.5)
RDW: 14.2 % (ref 11.5–15.5)
WBC: 14.2 K/uL — ABNORMAL HIGH (ref 4.0–10.5)
WBC: 12.6 10*3/uL — ABNORMAL HIGH (ref 4.0–10.5)
nRBC: 0 % (ref 0.0–0.2)
nRBC: 0.2 % (ref 0.0–0.2)

## 2019-03-09 LAB — DIC (DISSEMINATED INTRAVASCULAR COAGULATION)PANEL
D-Dimer, Quant: 8.83 ug/mL-FEU — ABNORMAL HIGH (ref 0.00–0.50)
Fibrinogen: 780 mg/dL — ABNORMAL HIGH (ref 210–475)
INR: 1.1 (ref 0.8–1.2)
Platelets: 317 10*3/uL (ref 150–400)
Prothrombin Time: 13.6 seconds (ref 11.4–15.2)
Smear Review: NONE SEEN
aPTT: 45 seconds — ABNORMAL HIGH (ref 24–36)

## 2019-03-09 MED ORDER — SODIUM CHLORIDE 0.9% IV SOLUTION: Freq: Once | INTRAVENOUS | Status: DC

## 2019-03-09 MED ORDER — CHLORHEXIDINE GLUCONATE CLOTH 2 % EX PADS
6.0000 | MEDICATED_PAD | Freq: Every day | CUTANEOUS | Status: DC
  Administered 2019-03-09: 6 via TOPICAL

## 2019-03-09 MED ORDER — EPOETIN ALFA 20000 UNIT/ML IJ SOLN
20000.0000 [IU] | INTRAMUSCULAR | Status: DC
  Filled 2019-03-09: qty 1

## 2019-03-09 NOTE — Progress Notes (Signed)
CRITICAL VALUE ALERT  Critical Value: Hgb 6.6  Date & Time Notied:  03/09/2019 1154  Provider Notified: Dr. Laural Golden PAGED   Orders Received/Actions taken: no new orders at this time

## 2019-03-09 NOTE — Evaluation (Signed)
Physical Therapy Evaluation Patient Details Name: Kevin Keimig Sr. MRN: [id number] DOB: [birth year] Today's Date: 03/09/2019   History of Present Illness  Patient is a 73 year old man with a history of ESRD on HD, known PVD, HFrEF, CAD, and T2DM for critical limb ischemia of the right foot.  Now s/p R AKA on 03/06/2019.  Clinical Impression  Patient presents with decreased independence with mobility due to pain, decreased strength, decreased sitting balance, decreased standing balance, decreased activity tolerance and will benefit from skilled PT in the acute setting to allow return home with family support following CIR level rehab stay.     Follow Up Recommendations CIR    Equipment Recommendations  Wheelchair (measurements PT);Wheelchair cushion (measurements PT)    Recommendations for Other Services Rehab consult     Precautions / Restrictions Precautions Precautions: Fall Restrictions Weight Bearing Restrictions: Yes RLE Weight Bearing: Non weight bearing      Mobility  Bed Mobility Overal bed mobility: Needs Assistance Bed Mobility: Supine to Sit     Supine to sit: HOB elevated;Max assist;+2 for physical assistance     General bed mobility comments: slow and assisted scooting to L edge of bed, pulls up with 2 HHA.  Transfers Overall transfer level: Needs assistance   Transfers: Lateral/Scoot Transfers          Lateral/Scoot Transfers: Mod assist;+2 physical assistance General transfer comment: scooting slowly max cues for anterior weight shift, using L LE and assist for moving R LE, hips and lost dressing from R LE halfway during transfer.  RN arrived and delivered pain medication and aware of dressing change  Ambulation/Gait                Stairs            Wheelchair Mobility    Modified Rankin (Stroke Patients Only)       Balance Overall balance assessment: Needs assistance Sitting-balance support: Feet supported Sitting  balance-Leahy Scale: Fair Sitting balance - Comments: sitting EOB while moving bed to re-position chair on L side and able to sit without physical help.         Standing balance comment: NT due to pain                             Pertinent Vitals/Pain Pain Assessment: 0-10 Pain Score: 8  Pain Location: R LE Pain Descriptors / Indicators: Operative site guarding;Discomfort;Sore Pain Intervention(s): RN gave pain meds during session;Monitored during session;Repositioned    Home Living Family/patient expects to be discharged to:: Private residence Living Arrangements: Spouse/significant other Available Help at Discharge: Family Type of Home: House Home Access: Stairs to enter   Technical brewer of Steps: 1 Home Layout: One level Home Equipment: Environmental consultant - 2 wheels      Prior Function Level of Independence: Independent with assistive device(s)         Comments: walked with RW and drove to dialysis     Hand Dominance        Extremity/Trunk Assessment   Upper Extremity Assessment Upper Extremity Assessment: RUE deficits/detail RUE Deficits / Details: AROM and strength grossly WFL, but with maturing HD access in upper arm    Lower Extremity Assessment Lower Extremity Assessment: RLE deficits/detail RLE Deficits / Details: AKA with dressing sliding off and some bloody drainage, re-wrapped with 4x4's, Kerlix and ace wrap.  needs assist to lift for dressing and c/o pain       Communication  Communication: No difficulties  Cognition Arousal/Alertness: Awake/alert Behavior During Therapy: WFL for tasks assessed/performed Overall Cognitive Status: Within Functional Limits for tasks assessed                                        General Comments General comments (skin integrity, edema, etc.): NT made aware of transfer status and increased time and pain and recommendation for using lift for back to bed    Exercises      Assessment/Plan    PT Assessment Patient needs continued PT services  PT Problem List Decreased strength;Decreased activity tolerance;Decreased balance;Decreased mobility;Decreased safety awareness;Decreased knowledge of use of DME;Pain       PT Treatment Interventions DME instruction;Therapeutic exercise;Wheelchair mobility training;Balance training;Functional mobility training;Therapeutic activities;Patient/family education    PT Goals (Current goals can be found in the Care Plan section)  Acute Rehab PT Goals Patient Stated Goal: pt/wife agreeable to rehab PT Goal Formulation: With patient/family Time For Goal Achievement: 03/23/19 Potential to Achieve Goals: Good    Frequency Min 3X/week   Barriers to discharge        Co-evaluation               AM-PAC PT "6 Clicks" Mobility  Outcome Measure Help needed turning from your back to your side while in a flat bed without using bedrails?: A Lot Help needed moving from lying on your back to sitting on the side of a flat bed without using bedrails?: Total Help needed moving to and from a bed to a chair (including a wheelchair)?: Total Help needed standing up from a chair using your arms (e.g., wheelchair or bedside chair)?: Total Help needed to walk in hospital room?: Total Help needed climbing 3-5 steps with a railing? : Total 6 Click Score: 7    End of Session Equipment Utilized During Treatment: Gait belt Activity Tolerance: Patient limited by pain Patient left: in chair;with call bell/phone within reach;with family/visitor present Nurse Communication: Mobility status;Need for lift equipment PT Visit Diagnosis: Muscle weakness (generalized) (M62.81);Difficulty in walking, not elsewhere classified (R26.2);Pain Pain - Right/Left: Right Pain - part of body: Leg    Time: 5364-6803 PT Time Calculation (min) (ACUTE ONLY): 25 min   Charges:   PT Evaluation $PT Eval Moderate Complexity: 1 Mod PT  Treatments $Therapeutic Activity: 8-22 mins        Kevin Castillo, [place] [facility] [phone] 03/09/2019   Kevin Castillo 03/09/2019, 11:22 AM

## 2019-03-09 NOTE — Progress Notes (Signed)
Subjective  - POD #1, s/p right AKA  Pain is controlled this am   Physical Exam:  Dressing dry       Assessment/Plan:  POD #1`  Plan dressing change tomorrow. Hopefully can goto CIR on MOnday  Kevin Castillo 03/09/2019 10:34 AM --  Vitals:   03/09/19 0522 03/09/19 0636  BP:    Pulse:    Resp:    Temp: (!) 100.6 F (38.1 C) 99.9 F (37.7 C)  SpO2:      Intake/Output Summary (Last 24 hours) at 03/09/2019 1034 Last data filed at 03/09/2019 0400 Gross per 24 hour  Intake 745.88 ml  Output 39 ml  Net 706.88 ml     Laboratory CBC    Component Value Date/Time   WBC 14.3 (H) 03/13/2019 1545   HGB 7.1 (L) 03/09/2019 1545   HGB 11.2 (L) 09/25/2018 1249   HGB 8.9 (L) 07/11/2018 1637   HCT 22.9 (L) 03/01/2019 1545   HCT 27.8 (L) 07/11/2018 1637   PLT 377 03/12/2019 1545   PLT 181 09/25/2018 1249   PLT 122 (L) 07/11/2018 1637    BMET    Component Value Date/Time   NA 135 02/26/2019 0337   NA 144 07/11/2018 1637   K 4.8 02/24/2019 0337   CL 98 03/06/2019 0337   CO2 18 (L) 03/12/2019 0337   GLUCOSE 80 02/22/2019 0337   BUN 42 (H) 02/28/2019 0337   BUN 56 (H) 07/11/2018 1637   CREATININE 7.76 (H) 03/01/2019 0337   CREATININE 1.54 (H) 05/28/2014 1046   CALCIUM 8.1 (L) 03/17/2019 0337   GFRNONAA 6 (L) 02/24/2019 0337   GFRNONAA 46 (L) 05/28/2014 1046   GFRAA 7 (L) 02/22/2019 0337   GFRAA 53 (L) 05/28/2014 1046    COAG Lab Results  Component Value Date   INR 1.18 07/19/2018   INR 1.0 03/09/2015   INR 2.00 06/23/2014   No results found for: PTT  Antibiotics Anti-infectives (From admission, onward)   Start     Dose/Rate Route Frequency Ordered Stop   03/10/2019 2000  vancomycin (VANCOCIN) IVPB 750 mg/150 ml premix     750 mg 150 mL/hr over 60 Minutes Intravenous Every M-W-F (Hemodialysis) 03/11/2019 1705     02/28/2019 1200  vancomycin (VANCOCIN) IVPB 750 mg/150 ml premix  Status:  Discontinued     750 mg 150 mL/hr over 60 Minutes Intravenous  Every M-W-F (Hemodialysis) 02/26/2019 1834 02/17/2019 1052   03/13/2019 1130  cefTRIAXone (ROCEPHIN) 2 g in sodium chloride 0.9 % 100 mL IVPB     2 g 200 mL/hr over 30 Minutes Intravenous Daily 03/13/2019 1025     03/13/2019 1055  vancomycin variable dose per unstable renal function (pharmacist dosing)      Does not apply See admin instructions 02/26/2019 1055     02/18/2019 1800  vancomycin (VANCOCIN) IVPB 1000 mg/200 mL premix  Status:  Discontinued     1,000 mg 200 mL/hr over 60 Minutes Intravenous  Once 02/20/2019 1745 03/06/2019 1747   03/06/2019 1800  cefTRIAXone (ROCEPHIN) 2 g in sodium chloride 0.9 % 100 mL IVPB     2 g 200 mL/hr over 30 Minutes Intravenous  Once 02/17/2019 1745 03/19/2019 1840   02/27/2019 1800  vancomycin (VANCOCIN) 1,750 mg in sodium chloride 0.9 % 500 mL IVPB     1,750 mg 250 mL/hr over 120 Minutes Intravenous  Once 02/22/2019 1747 03/06/2019 2102       V. Leia Alf, M.D., [facility] [place] Office: [phone] Pager:  [phone]

## 2019-03-09 NOTE — Progress Notes (Signed)
  Date: 03/09/2019  Patient name: Kevin Begley Sr.  Medical record number: [id number]  Date of birth: 04-18-1946   I have seen and evaluated this patient and I have discussed the plan of care with the house staff. Please see their note for complete details. I concur with their findings with the following additions/corrections:   Went for right above-knee amputation yesterday with vascular surgery.  Seems to have done well, reports the pain at the surgical site is bothersome when he tries to move around in bed or puts pressure on it, but otherwise feels okay.  He has been evaluated by PT and they are recommending inpatient rehab.  Unfortunately, his hemoglobin continues to be low, 6.6 today.  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  The etiology of his anemia is perplexing and likely multifactorial.  He has an inadequate marrow response with a reticulocyte index of 0.5, but he also has an elevated LDH of 342, suggesting hemolysis or other source of cell turnover.  We will investigate further today with haptoglobin, smear, and coags.  For now, we will hold off on transfusion and continue to trend his H/H.  Regarding his fevers, his overall trend is improving, with T-max overnight 100.6.  Most likely related to infection in his ischemic right foot, which has now been removed.  Blood cultures are still negative, with adequate source control, we can probably stop antibiotics and see how he does off of them.  Lenice Pressman, M.D., Ph.D. 03/09/2019, 12:15 PM

## 2019-03-09 NOTE — Progress Notes (Signed)
Rehab Admissions Coordinator Note:  Patient was screened by Retta Diones for appropriateness for an Inpatient Acute Rehab Consult.  At this time, we are recommending Inpatient Rehab consult.  Jodell Cipro M 03/09/2019, 11:28 AM  I can be reached at [phone].

## 2019-03-09 NOTE — Progress Notes (Signed)
   Subjective: Kevin Castillo reports soreness in his right stump. He states he feels it most when he tries moving. Denies any nausea, vomiting or abdominal pain. States he does not have much of an appetite. All questions and concerns addressed.   Objective:  Vital signs in last 24 hours: Vitals:   03/03/2019 2232 03/09/19 0354 03/09/19 0522 03/09/19 0636  BP: (!) 100/46 (!) 125/51    Pulse: 100 (!) 101    Resp: 18 18    Temp: 97.9 F (36.6 C) (!) 100.5 F (38.1 C) (!) 100.6 F (38.1 C) 99.9 F (37.7 C)  TempSrc: Oral Axillary Axillary Axillary  SpO2:  97%    Weight:      Height:       Physical Exam Gen: comfortably laying in bed, no distress CV: RRR, no murmurs Ext: right AKA in dressing/bandage, no LE edema, left LE warm to touch, toes are cool to touch, distal pulse intact   Assessment/Plan:  Principal Problem:   Critical lower limb ischemia Active Problems:   Type 2 diabetes mellitus with stage 5 chronic kidney disease (HCC)   Essential hypertension   Peripheral vascular occlusive disease (HCC)   Chronic systolic heart failure (HCC)   Anemia   Fever, unspecified   Elevated transaminase level  Kevin Castillo presents with leg pain secondary to right lower extremity vascular disease. He has been seen by vascular surgery who recommends an above-the-knee amputation.   Right lowerlimb ischemia with potential soft tissue infection: Patient is POD 1 s/p right AKA. No procedural complications.   -Continue vancomycin and ceftriaxone -Hydromorphone 0.5 mg q4h PRN pain - Blood cultures NGTD - PT eval and treat   Fever: Two reported fevers overnight, tmax 100.6. Respiratory and flu panel negative. Most likely related to his right lower extremity infection and surgery. Will continue to monitor.  - Respiratory and influenza panel negative  - Blood cultures NGTD   Normocytic Anemia:Hb 7.1 yesterday after surgery (11.2 three weeks ago). Unclear etiology. No signs of active  bleeding. 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 Reticulocytes low, LDH elevated 342. Will repeat CBC and get a DIC panel today.  - Follow up CBC and DIC panel   ESRD on HD(MWF):HD yesterday -Nephrology on board for inpatient dialysis, appreciate recommendations   Dispo: Anticipated discharge pending clinical improvement.   Mike Craze, DO 03/09/2019, 6:44 AM Pager: [phone]

## 2019-03-09 NOTE — Progress Notes (Addendum)
[facility] Progress Note   Subjective: Up in chair. Has been working with PT. No C/Os. Planning on going to CIR Monday.   Objective Vitals:   03/03/2019 2232 03/09/19 0354 03/09/19 0522 03/09/19 0636  BP: (!) 100/46 (!) 125/51    Pulse: 100 (!) 101    Resp: 18 18    Temp: 97.9 F (36.6 C) (!) 100.5 F (38.1 C) (!) 100.6 F (38.1 C) 99.9 F (37.7 C)  TempSrc: Oral Axillary Axillary Axillary  SpO2:  97%    Weight:      Height:       Physical Exam General: Pleasant elderly male in NAD Heart: S1,S2 RRR Lungs: CTAB A/P Abdomen: Active BS Extremities: R AKA drsg CDI. No LLE edema.  Dialysis Access: R AVF + bruit, bruit diminished in upper portion of AVF, RIJ TDC drsg CDI    Additional Objective Labs: Basic Metabolic Panel: Recent Labs  Lab 02/26/2019 1643 02/22/2019 0337  NA 134* 135  K 3.9 4.8  CL 93* 98  CO2 23 18*  GLUCOSE 113* 80  BUN 34* 42*  CREATININE 6.80* 7.76*  CALCIUM 8.9 8.1*   Liver Function Tests: Recent Labs  Lab 02/17/2019 1643 03/19/2019 0337  AST 191* 166*  ALT 73* 72*  ALKPHOS 68 69  BILITOT 0.5 0.6  PROT 8.3* 7.8  ALBUMIN 2.5* 2.2*   No results for input(s): LIPASE, AMYLASE in the last 168 hours. CBC: Recent Labs  Lab 02/19/2019 1643 02/20/2019 0944 03/09/2019 1545  WBC 18.6* 14.2* 14.3*  NEUTROABS 15.9*  --   --   HGB 7.8* 6.6* 7.1*  HCT 25.4* 21.2* 22.9*  MCV 99.6 98.6 99.6  PLT 373 337 377   Blood Culture    Component Value Date/Time   SDES BLOOD LEFT FOREARM 03/13/2019 1810   SPECREQUEST  02/22/2019 1810    BOTTLES DRAWN AEROBIC AND ANAEROBIC Blood Culture adequate volume   CULT  03/19/2019 1810    NO GROWTH 2 DAYS Performed at [facility], [place] [address], [place], [place] [address]    REPTSTATUS PENDING 03/12/2019 1810    Cardiac Enzymes: No results for input(s): CKTOTAL, CKMB, CKMBINDEX, TROPONINI in the last 168 hours. CBG: Recent Labs  Lab 03/06/2019 1454 02/18/2019 1518 03/05/2019 1608  03/18/2019 1650 02/23/2019 2224  GLUCAP 142* 140* 156* 161* 87   Iron Studies:  Recent Labs    03/19/2019 0337  IRON 23*  TIBC 144*  FERRITIN 6,124*   @lablastinr3 @ Studies/Results: Dg Foot Complete Right  Result Date: 02/20/2019 CLINICAL DATA:  Chronic right foot pain. Possible gangrene. EXAM: RIGHT FOOT COMPLETE - 3+ VIEW COMPARISON:  None. FINDINGS: There is no focal osseous abnormality. No osteolysis, specifically. No visible soft tissue ulceration. No soft tissue emphysema. IMPRESSION: No radiographic evidence of osteomyelitis. Electronically Signed   By: Ulyses Jarred M.D.   On: 02/26/2019 17:32   Medications: . cefTRIAXone (ROCEPHIN)  IV 2 g (03/09/19 0910)  . vancomycin 750 mg (03/03/2019 2103)   . sodium chloride   Intravenous Once  . atorvastatin  20 mg Oral Daily  . darbepoetin (ARANESP) injection - NON-DIALYSIS  200 mcg Subcutaneous Q Fri-1800  . finasteride  5 mg Oral Daily  . heparin  5,000 Units Subcutaneous Q8H  . multivitamin  1 tablet Oral QHS  . sodium chloride flush  3 mL Intravenous Q12H  . vancomycin variable dose per unstable renal function (pharmacist dosing)   Does not apply See admin instructions   MWF  [place]  4h 64min   400/800   76kg   2/2 bath  TDC / R BC AVF (created Aug '19, using both)  Heparin 7000 units IV TIW  - has not had OP HD since 02/25/19  - M 50 ug  IV every 2 wks, last dose was 01/23/19   Assessment/Plan: 1. Ischemic R foot/ fevers - on IV abx. S/P R AKA 03/17/2019 Dr. Trula Slade.  2. ESRD on HD MWF. Next HD 03/11/19. Hold heparin.   3. Vol / HTN - BP controlled. HD 03/20 Pre wt 70.9 kg Net UF 1139 Post wt 70.5 kg. Under EDW. Will need lower EDW on DC.   4. H/o DM - not on meds at this time 5. CAD hx CABG 6.   Anemia CKD - Hb low 7.1 today, last esa in Feb. Last HD was 3/9, prob missing any ESA ordered. Tsat low 16% here but ^^ferritin and fevers , hold off on IV iron for now.  Start aranesp 200 ug per week. PRBCS ordered with HD yesterday but did  not receive. Will check CBC today.   Rita H. Brown NP-C 03/09/2019, 10:54 AM  [facility] [phone]  Pt seen, examined and agree w A/P as above. Pt went for R AKA surgery yesterday.  Today is up in bed, in good spirits, no fatigue or CP.  Will follow.  [facility] 03/09/2019, 1:12 PM   Addendum: Repeat HGB 6.6. Unfortunately patient is a Jehovah Witness and does not accept blood products. Ferritin 6124 Tsat 19%, cannot give Fe load. Will treat with high dose EPO during HD.   Kevin Fairly, NP-C

## 2019-03-10 ENCOUNTER — Inpatient Hospital Stay (HOSPITAL_COMMUNITY): Payer: Medicare HMO

## 2019-03-10 DIAGNOSIS — R5082 Postprocedural fever: Secondary | ICD-10-CM | POA: Insufficient documentation

## 2019-03-10 DIAGNOSIS — I120 Hypertensive chronic kidney disease with stage 5 chronic kidney disease or end stage renal disease: Secondary | ICD-10-CM

## 2019-03-10 DIAGNOSIS — Z951 Presence of aortocoronary bypass graft: Secondary | ICD-10-CM

## 2019-03-10 LAB — BASIC METABOLIC PANEL
Anion gap: 15 (ref 5–15)
BUN: 46 mg/dL — ABNORMAL HIGH (ref 8–23)
CO2: 23 mmol/L (ref 22–32)
Calcium: 7.5 mg/dL — ABNORMAL LOW (ref 8.9–10.3)
Chloride: 95 mmol/L — ABNORMAL LOW (ref 98–111)
Creatinine, Ser: 7.68 mg/dL — ABNORMAL HIGH (ref 0.61–1.24)
GFR calc Af Amer: 7 mL/min — ABNORMAL LOW (ref >60)
GFR calc non Af Amer: 6 mL/min — ABNORMAL LOW (ref >60)
Glucose, Bld: 111 mg/dL — ABNORMAL HIGH (ref 70–99)
Potassium: 3.5 mmol/L (ref 3.5–5.1)
Sodium: 133 mmol/L — ABNORMAL LOW (ref 135–145)

## 2019-03-10 LAB — VANCOMYCIN, RANDOM: Vancomycin Rm: 26

## 2019-03-10 LAB — CBC
HCT: 18.9 % — ABNORMAL LOW (ref 39.0–52.0)
Hemoglobin: 5.6 g/dL — CL (ref 13.0–17.0)
MCH: 28.9 pg (ref 26.0–34.0)
MCHC: 29.6 g/dL — ABNORMAL LOW (ref 30.0–36.0)
MCV: 97.4 fL (ref 80.0–100.0)
Platelets: 299 10*3/uL (ref 150–400)
RBC: 1.94 MIL/uL — ABNORMAL LOW (ref 4.22–5.81)
RDW: 14.4 % (ref 11.5–15.5)
WBC: 7.4 10*3/uL (ref 4.0–10.5)
nRBC: 0 % (ref 0.0–0.2)

## 2019-03-10 LAB — SEDIMENTATION RATE: Sed Rate: >140 mm/hr — ABNORMAL HIGH (ref 0–16)

## 2019-03-10 LAB — DIRECT ANTIGLOBULIN TEST (NOT AT ARMC)
DAT, IgG: NEGATIVE
DAT, complement: NEGATIVE

## 2019-03-10 LAB — LACTATE DEHYDROGENASE: LDH: 196 U/L — ABNORMAL HIGH (ref 98–192)

## 2019-03-10 LAB — SAVE SMEAR(SSMR), FOR PROVIDER SLIDE REVIEW

## 2019-03-10 MED ORDER — SODIUM CHLORIDE 0.9 % IV SOLN
2.0000 g | INTRAVENOUS | Status: DC
  Administered 2019-03-10 – 2019-03-11 (×2): 2 g via INTRAVENOUS
  Filled 2019-03-10 (×2): qty 20

## 2019-03-10 MED ORDER — VANCOMYCIN HCL IN DEXTROSE 750-5 MG/150ML-% IV SOLN
750.0000 mg | INTRAVENOUS | Status: DC
  Administered 2019-03-11: 750 mg via INTRAVENOUS
  Filled 2019-03-10: qty 150

## 2019-03-10 NOTE — Progress Notes (Addendum)
Pharmacy Antibiotic Note  Kevin Debois Sr. is a 73 y.o. male admitted on 03/19/2019 with cellulitis.   Pharmacy has been consulted for vancomycin dosing. Patient febrile s/p above knee amputation on 3/20. RR 14, HR 98, most recent WBC 12.6, down from a high of 18.6, most recent LA 1.7  Patient was previously loaded appropriately and received one maintenance dose of vancomycin 750mg  with last HD session on 3/20 with 4 hour session @ 400 BFR; estimated s/p HD session vancomycin level to be ~25 mcg/ml.   Plan: Vancomycin random today Vancomcyin 750 mg after next HD session F/u HD schedule for dose adjustments F/u C&S, de-escalation plans, and LOT  ---- ADDENDUM: Vancomycin Random today: 36mcg/ml; plans for HD on Monday per nephro note. F/u tolerance; vancomycin dose placed.  Height: 5\' 7"  (170.2 cm) Weight: 155 lb 6.8 oz (70.5 kg) IBW/kg (Calculated) : 66.1  Temp (24hrs), Avg:100.5 F (38.1 C), Min:98.2 F (36.8 C), Max:103.1 F (39.5 C)  Recent Labs  Lab 02/27/2019 1643 03/13/2019 1803 03/05/2019 0337 03/07/2019 0944 02/20/2019 1545 03/09/19 1119  WBC 18.6*  --   --  14.2* 14.3* 12.6*  CREATININE 6.80*  --  7.76*  --   --   --   LATICACIDVEN 2.9* 1.7  --   --   --   --     Estimated Creatinine Clearance: 7.9 mL/min (A) (by C-G formula based on SCr of 7.76 mg/dL (H)).    Allergies  Allergen Reactions  . Nicotine Transdermal System [Nicotine] Other (See Comments)    Vivid dreams    Antimicrobials this admission: Vancomycin 3/19 >>  Ceftriaxone 3/19 >>  Dose adjustments this admission: N/A  Microbiology results: 3/22 rBCx: sent 3/19 BCx: NGTD 3/19 MRSA PCR: Negative    Thank you for allowing pharmacy to be a part of this patient's care.  Tamela Gammon, PharmD 03/10/2019 7:45 AM PGY-1 Pharmacy Resident Direct Phone: [phone] Please check [email or web address] for unit-specific pharmacist phone numbers

## 2019-03-10 NOTE — Progress Notes (Signed)
  Date: 03/10/2019  Patient name: Kevin Remmel Sr.  Medical record number: [id number]  Date of birth: 04/30/1946   I have seen and evaluated this patient and I have discussed the plan of care with the house staff. Please see their note for complete details. I concur with their findings with the following additions/corrections:   He reports feeling well today, other than some pain in his stump.  Unfortunately, he continues to have fevers, up to 103.1 overnight.  This is surprising as we have assumed his fevers were due to infection of his necrotic foot, but we should have source control at this point and his blood cultures remain negative.    There is understandable concern about COVID-19 for him.  He does have a cough, but both he and his wife say that it is a chronic cough that has not changed and he has no other respiratory symptoms.  His lungs are clear and his CXR is also unchanged from prior.  Of course, none of this rules out COVID-19 definitively.  The problem is, with the 3 to 6-day turnaround of testing, we would be committed to using an extraordinary amount of PPE before the results come back, and we are reportedly already running low.  If he continues to spike fevers, we will likely need to send testing and investigate other possible sources of fever.  Lenice Pressman, M.D., Ph.D. 03/10/2019, 2:42 PM

## 2019-03-10 NOTE — Progress Notes (Signed)
   Subjective: Patient feels well this morning, no complaints. Denies chest pain, SOB, and dizziness. Continues to have pain from surgery.   Objective:  Vital signs in last 24 hours: Vitals:   03/09/19 1549 03/09/19 2138 03/10/19 0606 03/10/19 0713  BP: 122/60 (!) 121/52 (!) 154/63   Pulse: 86 97 99   Resp: 20 16 14    Temp: 98.2 F (36.8 C) 100 F (37.8 C) (!) 103.1 F (39.5 C) (!) 100.6 F (38.1 C)  TempSrc: Oral Oral Oral Oral  SpO2: 96% 94% 98%   Weight:      Height:       Physical Exam Constitutional: NAD, appears comfortable Cardiovascular: RRR, no murmurs, rubs, or gallops.  Pulmonary/Chest: CTAB, no wheezes, rales, or rhonchi. Abdominal: Soft, non tender, non distended. +BS.  Extremities: Right AKA well wrapped, no drainage, left LE without edema Psychiatric: Normal mood and affect   Assessment/Plan:  Principal Problem:   Critical lower limb ischemia Active Problems:   Type 2 diabetes mellitus with stage 5 chronic kidney disease (HCC)   Essential hypertension   Peripheral vascular occlusive disease (HCC)   Chronic systolic heart failure (HCC)   Anemia   Fever, unspecified   Elevated transaminase level  Kevin Castillo is a 73 yo M with a pmhx of HTN, Type II DM, PVD s/p right transmetatarsal amputation, ESRD on HD, and CAD s/p CABG who was presented with worsening pain of his right foot.   Ischemic Right Foot, probable osteomyelitis: S/p AKA on 3/20.  -- VVS consulted, appreciate recommendations -- PT/OT -- CIR consult  Fevers: Patient persistently febrile today, 103.1 and 100.6 this morning despite source control s/p AKA two days ago. Antibiotics were stopped yesterday. Will restart antibiotics today and repeat infectious work up. He does have a cough but patient and wife repeatedly state this is chronic and unchanged. CXR obtained this morning is negative for PNA or new infiltrate. Repeat blood cultures collected.  If unrevealing and fevers persist, may need  MRI of stump site to rule out residual osteomyelitis. SED rate is > 140. CRP canceled? -- Repeat blood cultures  -- CXR unchanged  -- Restart empiric vanc / cefepime   Anemia: Patient noted to have acute worsening of his chronic anemia, hemoglobin down to 6.6 yesterday and 5.6 this morning. 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 No clinical bleeding. Anemia work up has been unclear. LDH elevated, retic count low. Ferritin is very elevated > 6,000. Iron and TIBC low. No schistocytes seen on smear. Suspect mix picture with underlying anemia of chronic disease and possible post op blood loss? Although hemoglobin was down trending prior to surgery. Per nephrology he may have missed ESA doses. Last given in February, at which point hemoglobin was 11-13. Fortunately patient is hemodynamically stable and remains asymptomatic. LDH elevation may be related to surgery and is down trending today. Discussed briefly with hematology, recommended aranesp which has already been ordered per nephrology.  -- Repeat CBC & smear with pathologist review  -- Aranesp per nephrology   ESRD on HD (MWF) -- nephrology consulted; appreciate assistance   FEN: fluid restrict, lytes per dialysis, renal diet VTE ppx: holding heparin with anemia  Code Status: FULL   Dispo: Anticipated discharge pending improvement in infection and anemia.   Velna Ochs, MD 03/10/2019, 7:22 AM Pager: [phone]

## 2019-03-10 NOTE — Progress Notes (Signed)
Subjective  - POD #2  Now with cough Febrile yesterday and overnight.  Cultures sent   Physical Exam:  Dressing dry       Assessment/Plan:  POD #2  Will change dressing tomorrow Awaiting culture results.  Patient remains febrile and has a cough this am.  CXR unremarkable.  Discussed with nursing staff who will contact primary team  Kevin Castillo 03/10/2019 10:45 AM --  Vitals:   03/10/19 0713 03/10/19 0919  BP:    Pulse:    Resp:    Temp: (!) 100.6 F (38.1 C) 99 F (37.2 C)  SpO2:      Intake/Output Summary (Last 24 hours) at 03/10/2019 1045 Last data filed at 03/09/2019 1508 Gross per 24 hour  Intake 100 ml  Output -  Net 100 ml     Laboratory CBC    Component Value Date/Time   WBC 7.4 03/10/2019 0746   HGB 5.6 (LL) 03/10/2019 0746   HGB 11.2 (L) 09/25/2018 1249   HGB 8.9 (L) 07/11/2018 1637   HCT 18.9 (L) 03/10/2019 0746   HCT 27.8 (L) 07/11/2018 1637   PLT 299 03/10/2019 0746   PLT 181 09/25/2018 1249   PLT 122 (L) 07/11/2018 1637    BMET    Component Value Date/Time   NA 133 (L) 03/10/2019 0746   NA 144 07/11/2018 1637   K 3.5 03/10/2019 0746   CL 95 (L) 03/10/2019 0746   CO2 23 03/10/2019 0746   GLUCOSE 111 (H) 03/10/2019 0746   BUN 46 (H) 03/10/2019 0746   BUN 56 (H) 07/11/2018 1637   CREATININE 7.68 (H) 03/10/2019 0746   CREATININE 1.54 (H) 05/28/2014 1046   CALCIUM 7.5 (L) 03/10/2019 0746   GFRNONAA 6 (L) 03/10/2019 0746   GFRNONAA 46 (L) 05/28/2014 1046   GFRAA 7 (L) 03/10/2019 0746   GFRAA 53 (L) 05/28/2014 1046    COAG Lab Results  Component Value Date   INR 1.1 03/09/2019   INR 1.18 07/19/2018   INR 1.0 03/09/2015   No results found for: PTT  Antibiotics Anti-infectives (From admission, onward)   Start     Dose/Rate Route Frequency Ordered Stop   03/11/19 1200  vancomycin (VANCOCIN) IVPB 750 mg/150 ml premix     750 mg 150 mL/hr over 60 Minutes Intravenous Every M-W-F (Hemodialysis) 03/10/19 1006     03/10/19 0745  cefTRIAXone (ROCEPHIN) 2 g in sodium chloride 0.9 % 100 mL IVPB     2 g 200 mL/hr over 30 Minutes Intravenous Every 24 hours 03/10/19 0740     03/12/2019 2000  vancomycin (VANCOCIN) IVPB 750 mg/150 ml premix  Status:  Discontinued     750 mg 150 mL/hr over 60 Minutes Intravenous Every M-W-F (Hemodialysis) 02/23/2019 1705 03/09/19 1621   03/07/2019 1200  vancomycin (VANCOCIN) IVPB 750 mg/150 ml premix  Status:  Discontinued     750 mg 150 mL/hr over 60 Minutes Intravenous Every M-W-F (Hemodialysis) 03/11/2019 1834 02/18/2019 1052   02/27/2019 1130  cefTRIAXone (ROCEPHIN) 2 g in sodium chloride 0.9 % 100 mL IVPB  Status:  Discontinued     2 g 200 mL/hr over 30 Minutes Intravenous Daily 03/06/2019 1025 03/09/19 1621   03/09/2019 1055  vancomycin variable dose per unstable renal function (pharmacist dosing)  Status:  Discontinued      Does not apply See admin instructions 02/27/2019 1055 03/09/19 1621   03/03/2019 1800  vancomycin (VANCOCIN) IVPB 1000 mg/200 mL premix  Status:  Discontinued  1,000 mg 200 mL/hr over 60 Minutes Intravenous  Once 03/08/2019 1745 02/21/2019 1747   02/28/2019 1800  cefTRIAXone (ROCEPHIN) 2 g in sodium chloride 0.9 % 100 mL IVPB     2 g 200 mL/hr over 30 Minutes Intravenous  Once 02/27/2019 1745 03/09/2019 1840   02/20/2019 1800  vancomycin (VANCOCIN) 1,750 mg in sodium chloride 0.9 % 500 mL IVPB     1,750 mg 250 mL/hr over 120 Minutes Intravenous  Once 03/08/2019 1747 02/20/2019 2102       V. Leia Alf, M.D., [facility] [place] Office: [phone] Pager:  [phone]

## 2019-03-10 NOTE — Progress Notes (Addendum)
[facility] Progress Note   Subjective: C/Os soreness L stump. Denies SOB/CP/Dizziness in setting of HGB 5.6. Jehovah Witness patient-refuses blood. Discussed again with pt and wife.    Objective Vitals:   03/09/19 1549 03/09/19 2138 03/10/19 0606 03/10/19 0713  BP: 122/60 (!) 121/52 (!) 154/63   Pulse: 86 97 99   Resp: 20 16 14    Temp: 98.2 F (36.8 C) 100 F (37.8 C) (!) 103.1 F (39.5 C) (!) 100.6 F (38.1 C)  TempSrc: Oral Oral Oral Oral  SpO2: 96% 94% 98%   Weight:      Height:       Physical Exam General: Pleasant elderly male in NAD Heart: S1,S2 RRR Lungs: CTAB A/P Abdomen: Active BS Extremities: R AKA drsg CDI. No LLE edema.  Dialysis Access: R AVF + bruit, bruit diminished in upper portion of AVF, RIJ TDC drsg CDI   Additional Objective Labs: Basic Metabolic Panel: Recent Labs  Lab 03/13/2019 1643 03/06/2019 0337 03/10/19 0746  NA 134* 135 133*  K 3.9 4.8 3.5  CL 93* 98 95*  CO2 23 18* 23  GLUCOSE 113* 80 111*  BUN 34* 42* 46*  CREATININE 6.80* 7.76* 7.68*  CALCIUM 8.9 8.1* 7.5*   Liver Function Tests: Recent Labs  Lab 03/09/2019 1643 03/07/2019 0337  AST 191* 166*  ALT 73* 72*  ALKPHOS 68 69  BILITOT 0.5 0.6  PROT 8.3* 7.8  ALBUMIN 2.5* 2.2*   No results for input(s): LIPASE, AMYLASE in the last 168 hours. CBC: Recent Labs  Lab 03/13/2019 1643 03/13/2019 0944 02/18/2019 1545 03/09/19 1119 03/10/19 0746  WBC 18.6* 14.2* 14.3* 12.6* 7.4  NEUTROABS 15.9*  --   --   --   --   HGB 7.8* 6.6* 7.1* 6.6* 5.6*  HCT 25.4* 21.2* 22.9* 21.4* 18.9*  MCV 99.6 98.6 99.6 98.6 97.4  PLT 373 337 377 317  326 299   Blood Culture    Component Value Date/Time   SDES BLOOD LEFT FOREARM 03/01/2019 1810   SPECREQUEST  02/22/2019 1810    BOTTLES DRAWN AEROBIC AND ANAEROBIC Blood Culture adequate volume   CULT  03/06/2019 1810    NO GROWTH 3 DAYS Performed at [facility], [place] [address], [place], [place] [address]    REPTSTATUS PENDING  03/06/2019 1810    Cardiac Enzymes: No results for input(s): CKTOTAL, CKMB, CKMBINDEX, TROPONINI in the last 168 hours. CBG: Recent Labs  Lab 02/17/2019 1454 02/26/2019 1518 02/25/2019 1608 03/15/2019 1650 02/20/2019 2224  GLUCAP 142* 140* 156* 161* 87   Iron Studies:  Recent Labs    03/08/19 0337  IRON 23*  TIBC 144*  FERRITIN 6,124*   @lablastinr3 @ Studies/Results: Dg Chest 2 View  Result Date: 03/10/2019 CLINICAL DATA:  73 year old male with history of fever. EXAM: CHEST - 2 VIEW COMPARISON:  Chest x-ray 07/23/2018. FINDINGS: Right internal jugular PermCath with tip terminating in the right atrium. Status post median sternotomy for CABG. Lung volumes are normal. Chronic areas of pleuroparenchymal scarring and architectural distortion in the base of the right lung, similar to numerous prior examinations. No acute consolidative airspace disease. No pleural effusions. No evidence of pulmonary edema. Heart size is mildly enlarged. Upper mediastinal contours are within normal limits. Aortic atherosclerosis. IMPRESSION: 1. Postoperative changes and support apparatus, as above. 2. The appearance the chest is very similar to prior examinations with no definite radiographic evidence of acute cardiopulmonary disease. 3. Cardiomegaly. 4. Aortic atherosclerosis. Electronically Signed   By: Quillian Quince  Entrikin M.D.   On: 03/10/2019 08:14   Medications: . cefTRIAXone (ROCEPHIN)  IV 2 g (03/10/19 0828)   . sodium chloride   Intravenous Once  . sodium chloride   Intravenous Once  . atorvastatin  20 mg Oral Daily  . [START ON 03/11/2019] epoetin (EPOGEN/PROCRIT) injection  20,000 Units Subcutaneous Q M,W,F-HD  . finasteride  5 mg Oral Daily  . multivitamin  1 tablet Oral QHS  . sodium chloride flush  3 mL Intravenous Q12H     MWF [facility] 4h 41min 400/800 76kg 2/2 bath TDC / R BC AVF (created Aug '19, using both) Heparin 7000 units IV TIW - has not had OP HD since 02/25/19 - M 50 ug  IV every 2  wks, last dose was 01/23/19   Assessment/Plan: 1. Ischemic R foot/ fevers - on IV abx. S/P R AKA on 3/20 by Dr. Trula Slade.  2. ESRD on HD MWF. HD tomorrow. Hold heparin.   3. Vol / HTN - BP controlled. HD 03/20 Pre wt 70.9 kg Net UF 1139 Post wt 70.5 kg. Under EDW. Will need lower EDW on DC.   4. H/o DM - not on meds at this time 5. CAD hx CABG 6.   Anemia CKD - Hb low 5.9  today. Fortunately asymptomatic. 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 Refuses transfusion. Last esa in Feb. Last HD was 3/9, prob missing any OP ESA ordered.Tsat low 16% here but with ^^ferritin and fevers, were are holding off on IV iron for now. Started high-dose EPO 20K with each HD 3X/ week for severe anemia in ESRD pt's refusing blood transfusion (one of the UTD rec's).  May have quicker response than the newer esa's.   Rita H. Brown NP-C 03/10/2019, 9:19 AM  [facility] [phone]  Pt seen, examined and agree w assess/plan as above with additions as indicated.  [facility] 03/10/2019, 2:28 PM

## 2019-03-10 NOTE — Progress Notes (Signed)
Surgery approached RN about concern for new cough and fever present. Pt. currently not on any precautions, flu and RVP negative, chest xray and blood cultures pending. Informed primary team of surgery concerns, no additional order given at this time, per primary cough is chronic and no additional interventions needed.

## 2019-03-11 ENCOUNTER — Encounter (HOSPITAL_COMMUNITY): Payer: Self-pay | Admitting: Surgery

## 2019-03-11 DIAGNOSIS — I2510 Atherosclerotic heart disease of native coronary artery without angina pectoris: Secondary | ICD-10-CM

## 2019-03-11 DIAGNOSIS — Z89611 Acquired absence of right leg above knee: Secondary | ICD-10-CM

## 2019-03-11 DIAGNOSIS — Z8631 Personal history of diabetic foot ulcer: Secondary | ICD-10-CM

## 2019-03-11 DIAGNOSIS — F1020 Alcohol dependence, uncomplicated: Secondary | ICD-10-CM

## 2019-03-11 DIAGNOSIS — R509 Fever, unspecified: Secondary | ICD-10-CM | POA: Insufficient documentation

## 2019-03-11 DIAGNOSIS — I998 Other disorder of circulatory system: Secondary | ICD-10-CM

## 2019-03-11 DIAGNOSIS — J449 Chronic obstructive pulmonary disease, unspecified: Secondary | ICD-10-CM

## 2019-03-11 DIAGNOSIS — R05 Cough: Secondary | ICD-10-CM

## 2019-03-11 DIAGNOSIS — R011 Cardiac murmur, unspecified: Secondary | ICD-10-CM

## 2019-03-11 DIAGNOSIS — F17210 Nicotine dependence, cigarettes, uncomplicated: Secondary | ICD-10-CM

## 2019-03-11 DIAGNOSIS — E1122 Type 2 diabetes mellitus with diabetic chronic kidney disease: Secondary | ICD-10-CM

## 2019-03-11 DIAGNOSIS — Z992 Dependence on renal dialysis: Secondary | ICD-10-CM

## 2019-03-11 DIAGNOSIS — I509 Heart failure, unspecified: Secondary | ICD-10-CM

## 2019-03-11 DIAGNOSIS — Z91048 Other nonmedicinal substance allergy status: Secondary | ICD-10-CM

## 2019-03-11 DIAGNOSIS — N186 End stage renal disease: Secondary | ICD-10-CM

## 2019-03-11 LAB — CBC
HCT: 21.8 % — ABNORMAL LOW (ref 39.0–52.0)
Hemoglobin: 6.6 g/dL — CL (ref 13.0–17.0)
MCH: 29.6 pg (ref 26.0–34.0)
MCHC: 30.3 g/dL (ref 30.0–36.0)
MCV: 97.8 fL (ref 80.0–100.0)
Platelets: 306 10*3/uL (ref 150–400)
RBC: 2.23 MIL/uL — ABNORMAL LOW (ref 4.22–5.81)
RDW: 14.5 % (ref 11.5–15.5)
WBC: 7.3 10*3/uL (ref 4.0–10.5)
nRBC: 0.4 % — ABNORMAL HIGH (ref 0.0–0.2)

## 2019-03-11 LAB — BASIC METABOLIC PANEL
Anion gap: 17 — ABNORMAL HIGH (ref 5–15)
BUN: 61 mg/dL — ABNORMAL HIGH (ref 8–23)
CO2: 20 mmol/L — ABNORMAL LOW (ref 22–32)
Calcium: 7.5 mg/dL — ABNORMAL LOW (ref 8.9–10.3)
Chloride: 95 mmol/L — ABNORMAL LOW (ref 98–111)
Creatinine, Ser: 9.2 mg/dL — ABNORMAL HIGH (ref 0.61–1.24)
GFR calc Af Amer: 6 mL/min — ABNORMAL LOW (ref >60)
GFR calc non Af Amer: 5 mL/min — ABNORMAL LOW (ref >60)
Glucose, Bld: 91 mg/dL (ref 70–99)
Potassium: 3.7 mmol/L (ref 3.5–5.1)
Sodium: 132 mmol/L — ABNORMAL LOW (ref 135–145)

## 2019-03-11 LAB — HEPATITIS B SURFACE ANTIGEN
Hepatitis B Surface Ag: NEGATIVE
Hepatitis B Surface Ag: NEGATIVE

## 2019-03-11 LAB — C-REACTIVE PROTEIN: CRP: 19.8 mg/dL — ABNORMAL HIGH (ref <1.0)

## 2019-03-11 LAB — GLUCOSE, CAPILLARY
Glucose-Capillary: 12 mg/dL — CL (ref 70–99)
Glucose-Capillary: <10 mg/dL — CL (ref 70–99)
Glucose-Capillary: <10 mg/dL — CL (ref 70–99)

## 2019-03-11 LAB — HEPATITIS B SURFACE ANTIBODY,QUALITATIVE: Hep B S Ab: REACTIVE

## 2019-03-11 LAB — HAPTOGLOBIN: Haptoglobin: 502 mg/dL — ABNORMAL HIGH (ref 34–355)

## 2019-03-11 MED ORDER — HEPARIN SODIUM (PORCINE) 1000 UNIT/ML DIALYSIS: 1000.0000 [IU] | INTRAMUSCULAR | Status: DC | PRN

## 2019-03-11 MED ORDER — SODIUM CHLORIDE 0.9 % IV SOLN: 100.0000 mL | INTRAVENOUS | Status: DC | PRN

## 2019-03-11 MED ORDER — PENTAFLUOROPROP-TETRAFLUOROETH EX AERO: 1.0000 "application " | INHALATION_SPRAY | CUTANEOUS | Status: DC | PRN

## 2019-03-11 MED ORDER — ALTEPLASE 2 MG IJ SOLR: 2.0000 mg | Freq: Once | INTRAMUSCULAR | Status: DC | PRN

## 2019-03-11 MED ORDER — DARBEPOETIN ALFA 100 MCG/0.5ML IJ SOSY
PREFILLED_SYRINGE | INTRAMUSCULAR | Status: AC
  Filled 2019-03-11: qty 0.5

## 2019-03-11 MED ORDER — LIDOCAINE-PRILOCAINE 2.5-2.5 % EX CREA: 1.0000 "application " | TOPICAL_CREAM | CUTANEOUS | Status: DC | PRN

## 2019-03-11 MED ORDER — EPOETIN ALFA 20000 UNIT/ML IJ SOLN
20000.0000 [IU] | INTRAMUSCULAR | Status: DC
  Filled 2019-03-11: qty 1

## 2019-03-11 MED ORDER — EPOETIN ALFA 20000 UNIT/ML IJ SOLN
20000.0000 [IU] | INTRAMUSCULAR | Status: DC
  Administered 2019-03-11: 20000 [IU] via INTRAVENOUS
  Filled 2019-03-11 (×2): qty 1

## 2019-03-11 MED ORDER — VANCOMYCIN HCL IN DEXTROSE 750-5 MG/150ML-% IV SOLN
INTRAVENOUS | Status: AC
  Filled 2019-03-11: qty 150

## 2019-03-11 MED ORDER — DARBEPOETIN ALFA 100 MCG/0.5ML IJ SOSY: 100.0000 ug | PREFILLED_SYRINGE | INTRAMUSCULAR | Status: DC

## 2019-03-11 MED ORDER — LIDOCAINE HCL (PF) 1 % IJ SOLN: 5.0000 mL | INTRAMUSCULAR | Status: DC | PRN

## 2019-03-11 NOTE — Progress Notes (Signed)
   Subjective: No overnight events. Seen during HD. Reports he is continuing to have pain in his AKA mostly when he moves. He denies any fevers or trouble breathing. No SOB. He states he has been working with PT and it has been difficult due to the pain.   Objective:  Vital signs in last 24 hours: Vitals:   03/10/19 1223 03/10/19 1408 03/10/19 2038 03/11/19 0456  BP:  134/68 (!) 141/58 (!) 141/53  Pulse:  91 (!) 101 91  Resp:  (!) 24 16 16   Temp: 97.7 F (36.5 C) 97.7 F (36.5 C) 97.7 F (36.5 C) 100.2 F (37.9 C)  TempSrc: Oral Oral Oral Oral  SpO2:  90% 100% (!) 82%  Weight:      Height:       Physical Exam Gen: seen during HD, no distress CV: RRR, no murmurs  Ext: right AKA, incision site clean and dry, mild edema, no pain to palpation warm and well perfused   Assessment/Plan:  Principal Problem:   Critical lower limb ischemia Active Problems:   Type 2 diabetes mellitus with stage 5 chronic kidney disease (HCC)   Essential hypertension   Peripheral vascular occlusive disease (HCC)   Chronic systolic heart failure (HCC)   Anemia   Fever, unspecified   Elevated transaminase level   Postoperative fever  Kevin Castillo is a 73 yo M with a pmhx of HTN, Type II DM, PVD s/p right transmetatarsal amputation, ESRD on HD, and CAD s/p CABG who was presented with worsening pain of his right foot.   Ischemic Right Foot, probable osteomyelitis: S/p AKA on 3/20.  -- VVS consulted, appreciate recommendations -- PT/OT -- CIR consult  Fevers: Patient afebrile overnight, tmax 1002. He has been spiking fevers despite source control s/p AKA two days ago. Antibiotics were restarted yesterday. Repeat blood cultures have shown NGTD.  If fevers persist may need to proceed with COVID-19 testing. However, of note his cough is chronic and he has no changes in respiratory status. CXR unchanged. If no fever in the next 24 hours will discontinue abx. Can also consider MRI of stump site to rule  out residual osteomyelitis. CRP trending down -- Repeat blood cultures NGTD -- Restart empiric vanc / cefepime   Anemia: Patient noted to have acute worsening of his chronic anemia, hemoglobin 6.6 today. 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 No clinical bleeding. Anemia work up has been unclear. Suspect mix picture with underlying anemia of chronic disease and possible post op blood loss? Although hemoglobin was down trending prior to surgery. Per nephrology he may have missed ESA doses. Patient is hemodynamically stable and remains asymptomatic. -- Follow CBC -- Aranesp per nephrology   ESRD on HD (MWF) -- nephrology consulted; appreciate assistance   Dispo: Anticipated discharge pending clinical improvement.   Mike Craze, DO 03/11/2019, 6:45 AM Pager: [phone]

## 2019-03-11 NOTE — Transfer of Care (Signed)
Immediate Anesthesia Transfer of Care Note  Patient: Kevin Demeo Sr.  Procedure(s) Performed: AMPUTATION ABOVE KNEE, right (Right Leg Upper)  Patient Location: PACU  Anesthesia Type:General  Level of Consciousness: awake, alert  and oriented  Airway & Oxygen Therapy: Patient Spontanous Breathing and Patient connected to face mask oxygen  Post-op Assessment: Report given to RN, Post -op Vital signs reviewed and stable and Patient moving all extremities X 4  Post vital signs: Reviewed and stable  Last Vitals:  Vitals Value Taken Time  BP    Temp    Pulse    Resp    SpO2      Last Pain:  Vitals:   03/11/19 0700  TempSrc: (P) Oral  PainSc: (P) Asleep      Patients Stated Pain Goal: 3 (76/[id number])  Complications: No apparent anesthesia complications

## 2019-03-11 NOTE — Consult Note (Signed)
[facility]    Date of Admission:  03/09/2019     Total days of antibiotics 4 ` Vancomycin 5  Ceftriaxone 4               Reason for Consult: Fever     Referring Provider: Rebeca Alert  Primary Care Provider: Oval Linsey, MD   Assessment: Kevin Sinning Sr. is a 73 y.o. diabetic, dialysis dependent male now POD3 following above knee amputation on the right d/t critical limb ischemia and non-healing/gangrenous toe ulceration. He has had no worsening cough, shortness of breath, sore throat, nasal congestion, chest pain to explain his fever. His surgical incision is clean and w/o drainage. Some tenderness but being POD 3 not surprised. Would suspect fever more post op fever at this time. Would stop antimicrobials and observe.   He has had multiple opinions from various podiatrists ([facility], [facility]) as well as vascular surgeons; one consult of which he traveled to [place] with family via car - unclear as to where he went, how long he was there but it appears he was hospitalized for work up with return to [place] the day before admission. No ill contacts at home or family that has any upper or lower respiratory symptoms.   Can his HD line be pulled given his AVF can be cannulated and appears to be working well?   Plan: 1. Stop antibiotics 2. Follow fever trend     Principal Problem:   Critical lower limb ischemia Active Problems:   Type 2 diabetes mellitus with stage 5 chronic kidney disease (HCC)   Essential hypertension   Peripheral vascular occlusive disease (HCC)   Chronic systolic heart failure (HCC)   Anemia   Fever, unspecified   Elevated transaminase level   Postoperative fever   . sodium chloride   Intravenous Once  . sodium chloride   Intravenous Once  . atorvastatin  20 mg Oral Daily  . epoetin (EPOGEN/PROCRIT) injection  20,000 Units Intravenous Q M,W,F-HD  . finasteride  5 mg Oral Daily  . multivitamin  1 tablet Oral  QHS  . sodium chloride flush  3 mL Intravenous Q12H    HPI: Kevin Beals Sr. is a 73 y.o. male with past medical history of ESRD on dialysis, CHF, COPD, CAD, PVD, type 2 DM, alcoholism who was admitted on 02/22/2019 due to ongoing right leg pain.  He is followed outpatient by Dr. Donzetta Matters with the vascular surgery team.  He developed an ulceration on the right toe that would not heal; he underwent angiogram and unfortunately no revascularization options due to extensive non-reconstructable disease and above-knee amputation was recommended.  This was at the end of February on the 27th when he had his abdominal aortogram.  Apparently during the interval he drove to [place] after doing some online research in an effort to get a second opinion on the recommendation to amputate.  He received empiric vancomycin and ceftriaxone up until his surgery, however these were resumed due to postop fever to 103.1 F on March 22.  We were asked to evaluate the patient with travel history to [place] by a car and multiple states in between with fevers postop and a complaint of cough.  In further discussion with Mr. Kluth he describes that his cough is currently the same quality that it usually is.  Reports "this is been the way it is for over a year now."  He has had no  worsening cough, shortness of breath, sore throat, nasal congestion, chest pain.  Chest x-ray is clear.  Op note reviewed from Dr. Trula Slade from March 20- viable tissue was found at the level of amputation site without any evidence of infection.   Since his surgery he has done well.  He still has tenderness along the incision line which limits his ability to want to get up and participate in physical therapy.  He is quite sleepy on my exam as he is undergoing HD treatment currently via R AVF. He also has a HD catheter in place in R chest that has been there 9 months now with plans to be removed.   Review of Systems: Review of Systems  Constitutional: Positive  for fever and malaise/fatigue. Negative for chills and diaphoresis.  HENT: Negative for congestion, sinus pain and sore throat.   Eyes: Negative for blurred vision.  Respiratory: Positive for cough (chronic in nature, dry, non-productive). Negative for shortness of breath.   Cardiovascular: Negative for chest pain.  Gastrointestinal: Negative for abdominal pain, constipation and diarrhea.  Genitourinary:       Anuric  Musculoskeletal:       Stump incision pain.   Skin: Negative for rash.  Neurological: Negative for focal weakness.    Past Medical History:  Diagnosis Date  . Acute left systolic heart failure ([place]) 01/06/2016  . Adenoma of left adrenal gland 06/16/2014   CT Abdomen (06/04/2014): Incidental, 20 mm, 8.33 HU suggesting benign adenoma   . Alcohol abuse 02/15/2007   Reports consuming 1 pint of gin on weekends, does not drink every day. Denies history of seizures, blackouts, or tremors.    . Allergic rhinitis 04/09/2014  . Anemia 11/12/2013   Unclear cause as of yet (? EtOH abuse)   . Anemia of chronic kidney failure, stage 4 (severe) ([place]) 07/28/2017  . Benign prostatic hypertrophy 06/02/2014  . Chronic kidney disease, stage 3 ([place]) 11/12/2013  . Chronic systolic heart failure ([place]) 01/15/2016   Echo (01/07/2016): LVEF 35%  . COPD (chronic obstructive pulmonary disease) ([place])   . Coronary artery disease 02/15/2007   Non-STEMI 07/2005, s/p CABG x 4 (08/01/2005): LIMA to LAD, SVG to OM, RCA, PCA   . DVT (deep venous thrombosis) ([place]) 2006   RLE "when I had OHS"  . Erectile dysfunction associated with type 2 diabetes mellitus ([place]) 02/15/2007  . Essential hypertension 02/15/2007  . Hyperlipidemia 02/15/2007  . Major depression, single episode 11/02/2015  . Obesity (BMI 30.0-34.9) 02/15/2007  . Peripheral vascular occlusive disease ([place]) 12/30/2013   ABI (01/03/2014): Right 0.64, Left 0.64   . Pneumonia X 1  . Secondary hyperparathyroidism of renal origin ([place]) 07/28/2017  . Sigmoid  diverticulosis 01/15/2016   Minimal per colonoscopy 01/11/2016  . Tobacco abuse 02/15/2007  . Tubular adenoma of colon 01/11/2016   Two 6 mm semi-pedunculated tubular adenomas excised endoscopically 01/11/2016.  Repeat colonoscopy due 12/2020.  . Type 2 diabetes mellitus with mild nonproliferative diabetic retinopathy with macular edema, bilateral ([place]) 10/28/2016  . Type 2 diabetes mellitus with stage 3 chronic kidney disease ([place]) 02/15/2007  . Venous thromboembolism 11/11/2013   LE Dopplers (01/2011): RLE DVT. CTA with mild subacute to chronic right-sided pulmonary emboli.  Patient has elected to continue anticoagulation.     Social History   Tobacco Use  . Smoking status: Current Every Day Smoker    Packs/day: 0.50    Years: 60.00    Pack years: 30.00    Types: Cigarettes  . Smokeless tobacco:  Former Systems developer    Types: Snuff, Chew  Substance Use Topics  . Alcohol use: Yes    Alcohol/week: 5.0 standard drinks    Types: 5 Shots of liquor per week    Comment: 07/18/2018 Drinks 1/2 pint of gin over the weekend but does not drink everyday. Denies hx of blackouts, seizures, tremors.  . Drug use: No    Family History  Problem Relation Age of Onset  . Heart disease Father   . COPD Father   . Diabetes Mellitus II Mother   . Healthy Sister   . Healthy Brother   . Healthy Daughter   . Healthy Son   . Healthy Sister   . Healthy Sister   . Healthy Sister   . Healthy Sister   . Healthy Daughter   . Healthy Son   . Healthy Son   . Healthy Son   . Healthy Son   . Healthy Son   . Healthy Son    Allergies  Allergen Reactions  . Nicotine Transdermal System [Nicotine] Other (See Comments)    Vivid dreams    OBJECTIVE: Blood pressure (!) 148/57, pulse 96, temperature 98.3 F (36.8 C), temperature source Oral, resp. rate 18, height 5\' 7"  (1.702 m), weight 69.5 kg, SpO2 98 %.  Physical Exam Vitals signs and nursing note reviewed.  Constitutional:      Comments: Sleepy.  Undergoing  dialysis session.  Resting comfortably in bed in no distress.  Breathing comfortably.Marland Kitchen  HENT:     Mouth/Throat:     Mouth: Mucous membranes are dry.  Eyes:     General: No scleral icterus.    Pupils: Pupils are equal, round, and reactive to light.  Cardiovascular:     Rate and Rhythm: Normal rate and regular rhythm.     Heart sounds: Murmur (systolic) present.  Pulmonary:     Effort: Pulmonary effort is normal.     Breath sounds: Rhonchi (With few coarse rhonchi mid chest) present. No wheezing or rales.  Chest:     Chest wall: No tenderness.  Abdominal:     General: Bowel sounds are normal. There is no distension.  Musculoskeletal:     Comments: Right AKA incision line is well approximated, staples intact.  No drainage, warmth or erythema. There is some swelling along the inferior incision line that coincides with his tenderness.  Skin:    General: Skin is warm and dry.     Comments: R IJ tunneled HD catheter in place - unremarkable w/o tenderness, erythema, drainage.   Neurological:     Mental Status: He is oriented to person, place, and time.       Lab Results Lab Results  Component Value Date   WBC 7.3 03/11/2019   HGB 6.6 (LL) 03/11/2019   HCT 21.8 (L) 03/11/2019   MCV 97.8 03/11/2019   PLT 306 03/11/2019    Lab Results  Component Value Date   CREATININE 9.20 (H) 03/11/2019   BUN 61 (H) 03/11/2019   NA 132 (L) 03/11/2019   K 3.7 03/11/2019   CL 95 (L) 03/11/2019   CO2 20 (L) 03/11/2019    Lab Results  Component Value Date   ALT 72 (H) 03/07/2019   AST 166 (H) 03/07/2019   ALKPHOS 69 02/26/2019   BILITOT 0.6 02/26/2019     Microbiology: Recent Results (from the past 240 hour(s))  Culture, blood (routine x 2)     Status: None (Preliminary result)   Collection Time: 02/24/2019  4:40 PM  Result Value Ref Range Status   Specimen Description BLOOD LEFT HAND  Final   Special Requests   Final    BOTTLES DRAWN AEROBIC AND ANAEROBIC Blood Culture results may not  be optimal due to an excessive volume of blood received in culture bottles   Culture   Final    NO GROWTH 4 DAYS Performed at [facility] [address], [place], [place] [address]    Report Status PENDING  Incomplete  Culture, blood (routine x 2)     Status: None (Preliminary result)   Collection Time: 02/19/2019  6:10 PM  Result Value Ref Range Status   Specimen Description BLOOD LEFT FOREARM  Final   Special Requests   Final    BOTTLES DRAWN AEROBIC AND ANAEROBIC Blood Culture adequate volume   Culture   Final    NO GROWTH 4 DAYS Performed at [facility], [place] [address], [place], [place] [address]    Report Status PENDING  Incomplete  Surgical pcr screen     Status: None   Collection Time: 03/06/2019 11:33 PM  Result Value Ref Range Status   MRSA, PCR NEGATIVE NEGATIVE Final   Staphylococcus aureus NEGATIVE NEGATIVE Final    Comment: (NOTE) The Xpert SA Assay (FDA approved for NASAL specimens in patients 48 years of age and older), is one component of a comprehensive surveillance program. It is not intended to diagnose infection nor to guide or monitor treatment. Performed at [facility], [place] [address], [place], [place] [address]   Respiratory Panel by PCR     Status: None   Collection Time: 02/21/2019  9:26 AM  Result Value Ref Range Status   Adenovirus NOT DETECTED NOT DETECTED Final   Coronavirus 229E NOT DETECTED NOT DETECTED Final    Comment: (NOTE) The Coronavirus on the Respiratory Panel, DOES NOT test for the novel  Coronavirus (2019 nCoV)    Coronavirus HKU1 NOT DETECTED NOT DETECTED Final   Coronavirus NL63 NOT DETECTED NOT DETECTED Final   Coronavirus OC43 NOT DETECTED NOT DETECTED Final   Metapneumovirus NOT DETECTED NOT DETECTED Final   Rhinovirus / Enterovirus NOT DETECTED NOT DETECTED Final   Influenza A NOT DETECTED NOT DETECTED Final   Influenza B NOT DETECTED NOT DETECTED Final   Parainfluenza Virus 1 NOT DETECTED NOT DETECTED Final    Parainfluenza Virus 2 NOT DETECTED NOT DETECTED Final   Parainfluenza Virus 3 NOT DETECTED NOT DETECTED Final   Parainfluenza Virus 4 NOT DETECTED NOT DETECTED Final   Respiratory Syncytial Virus NOT DETECTED NOT DETECTED Final   Bordetella pertussis NOT DETECTED NOT DETECTED Final   Chlamydophila pneumoniae NOT DETECTED NOT DETECTED Final   Mycoplasma pneumoniae NOT DETECTED NOT DETECTED Final    Comment: Performed at [facility], [place]. [address], [place], [place] [address]  Culture, blood (routine x 2)     Status: None (Preliminary result)   Collection Time: 03/10/19  7:46 AM  Result Value Ref Range Status   Specimen Description BLOOD LEFT ANTECUBITAL  Final   Special Requests   Final    BOTTLES DRAWN AEROBIC ONLY Blood Culture adequate volume   Culture   Final    NO GROWTH 1 DAY Performed at [facility], [place] [address] [place],  [address]    Report Status PENDING  Incomplete  Culture, blood (routine x 2)     Status: None (Preliminary result)   Collection Time: 03/10/19  7:46 AM  Result Value Ref Range Status   Specimen Description BLOOD LEFT ARM  Final   Special Requests   Final    BOTTLES DRAWN AEROBIC ONLY Blood Culture adequate volume   Culture   Final    NO GROWTH 1 DAY Performed at [facility], [address] [place], [place] [address]    Report Status PENDING  Incomplete    Janene Madeira, MSN, NP-C [facility] [place] Cell: [phone] Pager: [phone]  03/11/2019 12:00 PM

## 2019-03-11 NOTE — Progress Notes (Signed)
Inpatient Rehabilitation Admissions Coordinator  Inpatient Rehab Consult received. I met with patient  at the bedside for rehabilitation assessment. We discussed goals and expectations of an inpatient rehab admission.  Noted plan per ID for follow up with fevers, worsening of chronic anemia and pt is Jehovah's witness. I will follow his progress with therapy to assist with planning dispo as he is able to tolerate more therapy given his medical issues.   Danne Baxter, RN, MSN Rehab Admissions Coordinator [phone] 03/11/2019 1:08 PM

## 2019-03-11 NOTE — Progress Notes (Signed)
  Date: 03/11/2019  Patient name: Kevin Selman Sr.  Medical record number: [id number]  Date of birth: 07/27/1946   I have seen and evaluated this patient and I have discussed the plan of care with the house staff. Please see their note for complete details. I concur with their findings with the following additions/corrections:  Seen and examined with the team while in dialysis this morning.  He continues to have some pain at his surgical site with movement, but says it is otherwise doing well.  On my exam, the surgical site appears clean with no drainage or erythema, and expected postsurgical swelling.  He continues to have a chronic occasional cough, but his lungs are clear with no respiratory distress.  Hemoglobin is 6.6 today, up slightly from 5.6 yesterday.  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  Nephrology is giving him high-dose erythropoiesis stimulating agents.  His LDH came down yesterday to 196 from 342 and DAT was negative.  Likely multifactorial anemia, continue current plan and trend H/H.  Fever remains a concern, but overall trend is improving.  My greatest suspicion is for residual inflammation and cytokine signaling after presumed infection of his ischemic right foot as well as in response to his surgery.  Appreciate ID consultation, agree we can stop antibiotics for now and observe.  They asked if his dialysis catheter can be removed, will defer to nephrology.  We will hold off on COVID-19 testing for now unless his fever curve worsens.  Lenice Pressman, M.D., Ph.D. 03/11/2019, 2:06 PM

## 2019-03-11 NOTE — Procedures (Signed)
I was present at this dialysis session. I have reviewed the session itself and made appropriate changes.   Vital signs in last 24 hours:  Temp:  [97.7 F (36.5 C)-100.2 F (37.9 C)] (P) 98.6 F (37 C) (03/23 0700) Pulse Rate:  [91-101] 93 (03/23 0727) Resp:  [16-24] 16 (03/23 0727) BP: (123-141)/(53-68) 123/58 (03/23 0727) SpO2:  [82 %-100 %] (P) 95 % (03/23 0700) Weight:  [71 kg-71.6 kg] (P) 71 kg (03/23 0700) Weight change:  Filed Weights   03/13/2019 2156 03/11/19 0656 03/11/19 0700  Weight: 70.5 kg 71.6 kg (P) 71 kg    Recent Labs  Lab 03/11/19 0549  NA 132*  K 3.7  CL 95*  CO2 20*  GLUCOSE 91  BUN 61*  CREATININE 9.20*  CALCIUM 7.5*    Recent Labs  Lab 03/11/2019 1643  03/09/19 1119 03/10/19 0746 03/11/19 0549  WBC 18.6*   < > 12.6* 7.4 7.3  NEUTROABS 15.9*  --   --   --   --   HGB 7.8*   < > 6.6* 5.6* 6.6*  HCT 25.4*   < > 21.4* 18.9* 21.8*  MCV 99.6   < > 98.6 97.4 97.8  PLT 373   < > 317  326 299 306   < > = values in this interval not displayed.    Scheduled Meds: . sodium chloride   Intravenous Once  . sodium chloride   Intravenous Once  . atorvastatin  20 mg Oral Daily  . epoetin (EPOGEN/PROCRIT) injection  20,000 Units Subcutaneous Q M,W,F-HD  . finasteride  5 mg Oral Daily  . multivitamin  1 tablet Oral QHS  . sodium chloride flush  3 mL Intravenous Q12H   Continuous Infusions: . cefTRIAXone (ROCEPHIN)  IV 2 g (03/10/19 0828)  . vancomycin     PRN Meds:.acetaminophen **OR** acetaminophen, HYDROmorphone (DILAUDID) injection, senna-docusate    Assessment and plan: 1. PAD- s/p R AKA 3/20 2. ESRD- cont with HD qMWF 3. Anemia of CKD with ABLA- patient is a Jehovah witness and refuses transfusion.  High dose ESA. Donetta Potts,  MD 03/11/2019, 8:38 AM

## 2019-03-12 ENCOUNTER — Inpatient Hospital Stay (HOSPITAL_COMMUNITY): Payer: Medicare HMO

## 2019-03-12 DIAGNOSIS — R062 Wheezing: Secondary | ICD-10-CM

## 2019-03-12 DIAGNOSIS — Z89439 Acquired absence of unspecified foot: Secondary | ICD-10-CM

## 2019-03-12 DIAGNOSIS — R748 Abnormal levels of other serum enzymes: Secondary | ICD-10-CM

## 2019-03-12 DIAGNOSIS — K0889 Other specified disorders of teeth and supporting structures: Secondary | ICD-10-CM

## 2019-03-12 LAB — CBC
HCT: 24.9 % — ABNORMAL LOW (ref 39.0–52.0)
Hemoglobin: 7.8 g/dL — ABNORMAL LOW (ref 13.0–17.0)
MCH: 30.4 pg (ref 26.0–34.0)
MCHC: 31.3 g/dL (ref 30.0–36.0)
MCV: 96.9 fL (ref 80.0–100.0)
Platelets: 229 10*3/uL (ref 150–400)
RBC: 2.57 MIL/uL — ABNORMAL LOW (ref 4.22–5.81)
RDW: 14.5 % (ref 11.5–15.5)
WBC: 7.2 10*3/uL (ref 4.0–10.5)
nRBC: 0.6 % — ABNORMAL HIGH (ref 0.0–0.2)

## 2019-03-12 LAB — CBC WITH DIFFERENTIAL/PLATELET
Abs Immature Granulocytes: 0.09 10*3/uL — ABNORMAL HIGH (ref 0.00–0.07)
Basophils Absolute: 0 10*3/uL (ref 0.0–0.1)
Basophils Relative: 1 %
Eosinophils Absolute: 0.1 10*3/uL (ref 0.0–0.5)
Eosinophils Relative: 1 %
HCT: 22.7 % — ABNORMAL LOW (ref 39.0–52.0)
Hemoglobin: 6.8 g/dL — CL (ref 13.0–17.0)
Immature Granulocytes: 1 %
Lymphocytes Relative: 10 %
Lymphs Abs: 0.7 10*3/uL (ref 0.7–4.0)
MCH: 29.3 pg (ref 26.0–34.0)
MCHC: 30 g/dL (ref 30.0–36.0)
MCV: 97.8 fL (ref 80.0–100.0)
Monocytes Absolute: 0.6 10*3/uL (ref 0.1–1.0)
Monocytes Relative: 8 %
Neutro Abs: 5.6 10*3/uL (ref 1.7–7.7)
Neutrophils Relative %: 79 %
Platelets: 243 10*3/uL (ref 150–400)
RBC: 2.32 MIL/uL — ABNORMAL LOW (ref 4.22–5.81)
RDW: 14.7 % (ref 11.5–15.5)
WBC: 7.1 10*3/uL (ref 4.0–10.5)
nRBC: 0.6 % — ABNORMAL HIGH (ref 0.0–0.2)

## 2019-03-12 LAB — CULTURE, BLOOD (ROUTINE X 2)
CULTURE: NO GROWTH
Culture: NO GROWTH
Special Requests: ADEQUATE

## 2019-03-12 LAB — HEPATIC FUNCTION PANEL
ALT: 59 U/L — ABNORMAL HIGH (ref 0–44)
AST: 101 U/L — ABNORMAL HIGH (ref 15–41)
Albumin: 2 g/dL — ABNORMAL LOW (ref 3.5–5.0)
Alkaline Phosphatase: 56 U/L (ref 38–126)
Total Bilirubin: 0.5 mg/dL (ref 0.3–1.2)
Total Protein: 7.1 g/dL (ref 6.5–8.1)

## 2019-03-12 MED ORDER — CALCITRIOL 0.25 MCG PO CAPS: 2.0000 ug | ORAL_CAPSULE | ORAL | Status: DC

## 2019-03-12 MED ORDER — CALCIUM ACETATE (PHOS BINDER) 667 MG PO CAPS
667.0000 mg | ORAL_CAPSULE | Freq: Three times a day (TID) | ORAL | Status: DC
  Administered 2019-03-12 – 2019-03-13 (×4): 667 mg via ORAL
  Filled 2019-03-12 (×4): qty 1

## 2019-03-12 MED ORDER — LANTHANUM CARBONATE 500 MG PO CHEW: 1000.0000 mg | CHEWABLE_TABLET | Freq: Three times a day (TID) | ORAL | Status: DC

## 2019-03-12 MED ORDER — CHLORHEXIDINE GLUCONATE CLOTH 2 % EX PADS
6.0000 | MEDICATED_PAD | Freq: Every day | CUTANEOUS | Status: DC
  Administered 2019-03-12 – 2019-03-13 (×2): 6 via TOPICAL

## 2019-03-12 NOTE — Progress Notes (Signed)
[facility] for Infectious Disease  Date of Admission:  02/21/2019     Total days of antibiotics 5 - last dose Vanc/Ceftriaxone 3/23           ASSESSMENT: [name] had another fever last night to 102.4 F that remains unexplained. His WBC count have normalized and anemia is recovering with Epogen therapy (Jehovas's witness with Hgb < 6). He is not having fevers during the day only at night. On exam pulmonary assessment reveals wheezing R>L with scattered rhonchi. He is not hypoxic and requires no supplemental oxygen at this time; however he is not doing much to see what exertion/activity would prompt. Would recommend repeating his chest x ray to ensure there have been no changes. He has chronic scarring/pleural thickening documented on CT scan back in September 2019. He has had travel to [place] and hospitalization and now with fever the possibility of COVID-19 pneumonia has been raised. I think he is at very low risk for this given he has been here 5 days with no progression of pulmonary symptoms or fever patterns (once daily - only getting antipyretics as response to fever). Other differentials to consider for him would be ?hepatitis syndrome (came in with elevated LFTs which was new) vs drug related (Epogen listed with 10-40% febrile reaction). Will add a differential to assess for eosinophilia as well as repeat his LFTs.   Will see what his repeat CXR shows to further consider COVID-19 testing as part of FUO work up in patient with chronic cough.   PLAN: 1. 2-view CXR please 2. Add differential to AM labs 3. Repeat LFTs 4. HIV testing   Principal Problem:   Fever, unspecified Active Problems:   Type 2 diabetes mellitus with stage 5 chronic kidney disease (HCC)   Essential hypertension   Peripheral vascular occlusive disease (HCC)   Chronic systolic heart failure (HCC)   Anemia   Critical lower limb ischemia   Elevated transaminase level   Postoperative fever  Fever   . sodium chloride   Intravenous Once  . sodium chloride   Intravenous Once  . atorvastatin  20 mg Oral Daily  . [START ON 03/13/2019] calcitRIOL  2 mcg Oral Q M,W,F-HD  . Chlorhexidine Gluconate Cloth  6 each Topical Q0600  . epoetin (EPOGEN/PROCRIT) injection  20,000 Units Intravenous Q M,W,F-HD  . finasteride  5 mg Oral Daily  . multivitamin  1 tablet Oral QHS  . sodium chloride flush  3 mL Intravenous Q12H    SUBJECTIVE: Kevin Castillo tells me that he is feeling fine this morning.  He states that his cough is the same as it always is.  Denies any abdominal pain, diarrhea, constipation.  He denies shortness of breath.  He was not able to get up due to the right stump tenderness yesterday and has not done much aside from laying in bed.  He did undergo successful dialysis treatment yesterday via right AV fistula.  He did notice the fever last night but did notice that he got sweaty once. He has not yet eaten his breakfast but not very hungry.   When asked again about travel history to [place] he reports he left over 2 weeks ago but later said he arrived home 3 weeks ago.   Review of Systems: Review of Systems  Constitutional: Positive for diaphoresis and fever. Negative for chills and malaise/fatigue.  HENT: Negative for sinus pain and sore throat.   Respiratory: Positive for cough (chronic).  Negative for sputum production, shortness of breath and wheezing.   Cardiovascular: Negative for chest pain and leg swelling.  Gastrointestinal: Negative for abdominal pain, diarrhea and vomiting.  Musculoskeletal: Negative for myalgias.    Allergies  Allergen Reactions  . Nicotine Transdermal System [Nicotine] Other (See Comments)    Vivid dreams    OBJECTIVE: Vitals:   03/11/19 2153 03/11/19 2314 03/12/19 0620 03/12/19 0631  BP: (!) 144/48  (!) 150/101   Pulse: 99  94   Resp: 16  16   Temp: (!) 102.4 F (39.1 C) (!) 100.4 F (38 C) 98.9 F (37.2 C)   TempSrc: Oral Oral Oral    SpO2: 97%  100%   Weight:    69.9 kg  Height:       Body mass index is 24.14 kg/m.  Physical Exam Constitutional:      General: He is not in acute distress.    Appearance: Normal appearance. He is not toxic-appearing.     Comments: Resting in bed comfortably.   HENT:     Mouth/Throat:     Mouth: Mucous membranes are moist.     Pharynx: Oropharynx is clear.     Comments: Very poor dentition, multiple missing teeth.  Eyes:     General: No scleral icterus.    Conjunctiva/sclera: Conjunctivae normal.  Cardiovascular:     Rate and Rhythm: Normal rate.     Heart sounds: Murmur (systolic) present.  Pulmonary:     Effort: Pulmonary effort is normal.     Breath sounds: Wheezing (diffuse wheezes R>L) and rhonchi (few coarse rhonchi) present.  Abdominal:     General: Bowel sounds are normal. There is no distension.     Tenderness: There is no abdominal tenderness.  Musculoskeletal:        General: Tenderness (R AKA stump. ) present.     Comments: R AKA dressing remains clean/dry.   Lymphadenopathy:     Cervical: No cervical adenopathy.  Skin:    Comments: LLE foot cool. Dry flaking skin, no breakdown.   Neurological:     Mental Status: He is alert and oriented to person, place, and time.     Comments: Seems to get mixed up with timeline of events.      Lab Results Lab Results  Component Value Date   WBC 7.2 03/12/2019   HGB 7.8 (L) 03/12/2019   HCT 24.9 (L) 03/12/2019   MCV 96.9 03/12/2019   PLT 229 03/12/2019    Lab Results  Component Value Date   CREATININE 9.20 (H) 03/11/2019   BUN 61 (H) 03/11/2019   NA 132 (L) 03/11/2019   K 3.7 03/11/2019   CL 95 (L) 03/11/2019   CO2 20 (L) 03/11/2019    Lab Results  Component Value Date   ALT 72 (H) 03/07/2019   AST 166 (H) 02/23/2019   ALKPHOS 69 03/06/2019   BILITOT 0.6 02/25/2019     Microbiology: Recent Results (from the past 240 hour(s))  Culture, blood (routine x 2)     Status: None (Preliminary result)    Collection Time: 02/27/2019  4:40 PM  Result Value Ref Range Status   Specimen Description BLOOD LEFT HAND  Final   Special Requests   Final    BOTTLES DRAWN AEROBIC AND ANAEROBIC Blood Culture results may not be optimal due to an excessive volume of blood received in culture bottles   Culture   Final    NO GROWTH 4 DAYS Performed at [facility],  [address], [place], [place] [address]    Report Status PENDING  Incomplete  Culture, blood (routine x 2)     Status: None (Preliminary result)   Collection Time: 03/01/2019  6:10 PM  Result Value Ref Range Status   Specimen Description BLOOD LEFT FOREARM  Final   Special Requests   Final    BOTTLES DRAWN AEROBIC AND ANAEROBIC Blood Culture adequate volume   Culture   Final    NO GROWTH 4 DAYS Performed at [facility], [place] [address], [place], [place] [address]    Report Status PENDING  Incomplete  Surgical pcr screen     Status: None   Collection Time: 02/23/2019 11:33 PM  Result Value Ref Range Status   MRSA, PCR NEGATIVE NEGATIVE Final   Staphylococcus aureus NEGATIVE NEGATIVE Final    Comment: (NOTE) The Xpert SA Assay (FDA approved for NASAL specimens in patients 50 years of age and older), is one component of a comprehensive surveillance program. It is not intended to diagnose infection nor to guide or monitor treatment. Performed at [facility], [place] [address] [place], [place] [address]   Respiratory Panel by PCR     Status: None   Collection Time: 03/10/2019  9:26 AM  Result Value Ref Range Status   Adenovirus NOT DETECTED NOT DETECTED Final   Coronavirus 229E NOT DETECTED NOT DETECTED Final    Comment: (NOTE) The Coronavirus on the Respiratory Panel, DOES NOT test for the novel  Coronavirus (2019 nCoV)    Coronavirus HKU1 NOT DETECTED NOT DETECTED Final   Coronavirus NL63 NOT DETECTED NOT DETECTED Final   Coronavirus OC43 NOT DETECTED NOT DETECTED Final   Metapneumovirus NOT DETECTED NOT DETECTED Final    Rhinovirus / Enterovirus NOT DETECTED NOT DETECTED Final   Influenza A NOT DETECTED NOT DETECTED Final   Influenza B NOT DETECTED NOT DETECTED Final   Parainfluenza Virus 1 NOT DETECTED NOT DETECTED Final   Parainfluenza Virus 2 NOT DETECTED NOT DETECTED Final   Parainfluenza Virus 3 NOT DETECTED NOT DETECTED Final   Parainfluenza Virus 4 NOT DETECTED NOT DETECTED Final   Respiratory Syncytial Virus NOT DETECTED NOT DETECTED Final   Bordetella pertussis NOT DETECTED NOT DETECTED Final   Chlamydophila pneumoniae NOT DETECTED NOT DETECTED Final   Mycoplasma pneumoniae NOT DETECTED NOT DETECTED Final    Comment: Performed at [facility], [place]. [address] [place], [place] [address]  Culture, blood (routine x 2)     Status: None (Preliminary result)   Collection Time: 03/10/19  7:46 AM  Result Value Ref Range Status   Specimen Description BLOOD LEFT ANTECUBITAL  Final   Special Requests   Final    BOTTLES DRAWN AEROBIC ONLY Blood Culture adequate volume   Culture   Final    NO GROWTH 1 DAY Performed at [facility], [place] [address], [place], [place] [address]    Report Status PENDING  Incomplete  Culture, blood (routine x 2)     Status: None (Preliminary result)   Collection Time: 03/10/19  7:46 AM  Result Value Ref Range Status   Specimen Description BLOOD LEFT ARM  Final   Special Requests   Final    BOTTLES DRAWN AEROBIC ONLY Blood Culture adequate volume   Culture   Final    NO GROWTH 1 DAY Performed at [facility], [place] [address], [place], [place] [address]    Report Status PENDING  Incomplete  Janene Madeira, MSN, NP-C [facility] for Infectious [place] Cell: [phone] Pager: [phone]  03/12/2019  10:14 AM

## 2019-03-12 NOTE — Progress Notes (Signed)
  Date: 03/12/2019  Patient name: Kevin Trinka Sr.  Medical record number: [id number]  Date of birth: Jan 23, 1946   I have seen and evaluated this patient and I have discussed the plan of care with the house staff. Please see their note for complete details. I concur with their findings with the following additions/corrections:   Unfortunately, continues to spike fevers up to 102.4 last night.  His amputation site appears to be doing well with no signs of postop infection.  His other foot has not been painful and has no signs of necrosis on exam.  The toes are slightly cool with no palpable DP pulses.  He continues to have a mild chronic cough, but he denies shortness of breath.  On exam, he has wheezing that is more prominent on the right with some rhonchi at the right base.  Etiology of these fevers remains unclear.  Appreciate ID assistance in parsing this out.  Although postop inflammatory changes were reasonable for the first couple days, at this point he is 4 days out from surgery and still having fevers.  COVID-19 is an understandable concern, but I also understand reluctance by ID to test as this would mean multiple days of PPE usage, which is already short.  We have repeated his CXR, which shows continued right lower lobe opacity and small pleural effusion, slightly worse by my read.  AST/ALT remain elevated in approximately 2-1 ratio, possible alcoholic hepatitis versus other infection.  We will check for HIV and consider additional viral testing.  Hgb is back down to 6.8 today, but he is clear that he would not want blood products.  We will continue to trend response to EPO.  Lenice Pressman, M.D., Ph.D. 03/12/2019, 2:09 PM

## 2019-03-12 NOTE — Anesthesia Postprocedure Evaluation (Signed)
Anesthesia Post Note  Patient: Kevin Burgard Sr.  Procedure(s) Performed: AMPUTATION ABOVE KNEE, right (Right Leg Upper)     Patient location during evaluation: PACU Anesthesia Type: General Level of consciousness: awake and patient cooperative Pain management: pain level controlled Vital Signs Assessment: post-procedure vital signs reviewed and stable Respiratory status: spontaneous breathing, nonlabored ventilation, respiratory function stable and patient connected to nasal cannula oxygen Cardiovascular status: blood pressure returned to baseline and stable Postop Assessment: no apparent nausea or vomiting Anesthetic complications: no    Last Vitals:  Vitals:   03/11/19 2314 03/12/19 0620  BP:  (!) 150/101  Pulse:  94  Resp:  16  Temp: (!) 38 C 37.2 C  SpO2:  100%    Last Pain:  Vitals:   03/12/19 0845  TempSrc:   PainSc: 0-No pain                 Governor Matos

## 2019-03-12 NOTE — Progress Notes (Addendum)
Patient states he has had diarrhea "all day".  He says at least 5 or more times. GI panel ordered and collected, and placed on enteric precautions at this time.

## 2019-03-12 NOTE — Progress Notes (Addendum)
[facility] Progress Note   Dialysis Orders: 4h 19min 400/800 76kg 2/2 bath TDC / R BC AVF (created Aug '19, using both) Heparin 7000units IV TIW - M 50 ugIVevery 2 wks, last dose was 01/23/19  Assessment/Plan: 1. Right AKA 3/20 for right leg ischemia -  2. ESRD -MWF - HD tomorrow - start with added K bath have been holding heparin- have been using 1: 1 per previous orders- need to clarify with RN.- looks like we used 16 yesterday at 250 3. Anemia/ABLA/JW - hgb pre HD 6.6 up to 7.8  today post HD/ EPO for support tsat 16% with ferritin 6124 3/20 - presurgery - with acute blood loss, Fe should be even lower-- recheck levels with HD Wed -  4. Secondary hyperparathyroidism - no VDRA or binders a present - follow labs. 5. HTN/volume - lower edw for d/c - due to AKA and also weight loss from poor intake - net UF 1 L Monday; low Na plus ^ BP supports the need for high UF 6. Nutrition - alb low 2.2 - poor intake - fevers/high risk for poor wound healing - discussed with staff - needs encouragement with eating and setting up - just setting the tray at bedside is not acceptable. 7. Fevers  -spike to 102.4 last pm CRP 19.8 WBC 7.2 - seen by ID - following off antibiotics 8. DM   Myriam Jacobson, PA-C [facility] [phone] 03/12/2019,9:56 AM  LOS: 5 days   Subjective:   Doesn't want breakfast. No c/o about dialysis yestserday  Objective Vitals:   03/11/19 2153 03/11/19 2314 03/12/19 0620 03/12/19 0631  BP: (!) 144/48  (!) 150/101   Pulse: 99  94   Resp: 16  16   Temp: (!) 102.4 F (39.1 C) (!) 100.4 F (38 C) 98.9 F (37.2 C)   TempSrc: Oral Oral Oral   SpO2: 97%  100%   Weight:    69.9 kg  Height:       Physical Exam General: NAD distress breathing easily Extremities: right AKA wrapped no LLE edema Dialysis Access:  Right AVG + bruit dim upper - right IJ [facility]   Additional Objective Labs: Basic Metabolic Panel: Recent Labs  Lab  02/19/2019 0337 03/10/19 0746 03/11/19 0549  NA 135 133* 132*  K 4.8 3.5 3.7  CL 98 95* 95*  CO2 18* 23 20*  GLUCOSE 80 111* 91  BUN 42* 46* 61*  CREATININE 7.76* 7.68* 9.20*  CALCIUM 8.1* 7.5* 7.5*   Liver Function Tests: Recent Labs  Lab 03/11/2019 1643 03/11/2019 0337  AST 191* 166*  ALT 73* 72*  ALKPHOS 68 69  BILITOT 0.5 0.6  PROT 8.3* 7.8  ALBUMIN 2.5* 2.2*   No results for input(s): LIPASE, AMYLASE in the last 168 hours. CBC: Recent Labs  Lab 03/13/2019 1643  02/21/2019 1545 03/09/19 1119 03/10/19 0746 03/11/19 0549 03/12/19 0347  WBC 18.6*   < > 14.3* 12.6* 7.4 7.3 7.2  NEUTROABS 15.9*  --   --   --   --   --   --   HGB 7.8*   < > 7.1* 6.6* 5.6* 6.6* 7.8*  HCT 25.4*   < > 22.9* 21.4* 18.9* 21.8* 24.9*  MCV 99.6   < > 99.6 98.6 97.4 97.8 96.9  PLT 373   < > 377 317  326 299 306 229   < > = values in this interval not displayed.   Blood Culture  Component Value Date/Time   SDES BLOOD LEFT ANTECUBITAL 03/10/2019 0746   SDES BLOOD LEFT ARM 03/10/2019 0746   SPECREQUEST  03/10/2019 0746    BOTTLES DRAWN AEROBIC ONLY Blood Culture adequate volume   SPECREQUEST  03/10/2019 0746    BOTTLES DRAWN AEROBIC ONLY Blood Culture adequate volume   CULT  03/10/2019 0746    NO GROWTH 1 DAY Performed at [facility], [place] [address] [place], [place] [address]    CULT  03/10/2019 0746    NO GROWTH 1 DAY Performed at [place] [address], [place], [place] [address]    REPTSTATUS PENDING 03/10/2019 0746   REPTSTATUS PENDING 03/10/2019 0746    Cardiac Enzymes: No results for input(s): CKTOTAL, CKMB, CKMBINDEX, TROPONINI in the last 168 hours. CBG: Recent Labs  Lab 03/06/2019 1454 03/07/2019 1518 03/12/2019 1608 02/27/2019 1650 03/10/2019 2224  GLUCAP 142* 140* 156* 161* 87   Iron Studies: No results for input(s): IRON, TIBC, TRANSFERRIN, FERRITIN in the last 72 hours. Lab Results  Component Value Date   INR 1.1 03/09/2019   INR 1.18 07/19/2018    INR 1.0 03/09/2015   Studies/Results: No results found. Medications:  . sodium chloride   Intravenous Once  . sodium chloride   Intravenous Once  . atorvastatin  20 mg Oral Daily  . epoetin (EPOGEN/PROCRIT) injection  20,000 Units Intravenous Q M,W,F-HD  . finasteride  5 mg Oral Daily  . multivitamin  1 tablet Oral QHS  . sodium chloride flush  3 mL Intravenous Q12H     I have seen and examined this patient and agree with plan and assessment in the above note with renal recommendations/intervention highlighted.  Will restart phoslo and vitamin D. Broadus John A Jericka Kadar,MD 03/12/2019 10:18 AM

## 2019-03-12 NOTE — Progress Notes (Signed)
OT Cancellation Note  Patient Details Name: Kevin Rickey Sr. MRN: [id number] DOB: [birth year]   Cancelled Treatment:    Reason Eval/Treat Not Completed: Other (comment) Per health system leadership, therapy services are being held at this time until patient tests negative for COVID-19.    Ramond Dial, OT/L   Acute OT Clinical Specialist Acute Rehabilitation Services Pager [phone] Office [phone]  03/12/2019, 1:41 PM

## 2019-03-12 NOTE — Plan of Care (Signed)

## 2019-03-12 NOTE — Progress Notes (Signed)
PT Cancellation Note  Patient Details Name: Kevin Sandt Sr. MRN: [id number] DOB: [birth year]   Cancelled Treatment:    Reason Eval/Treat Not Completed: Medical issues which prohibited therapy Per health system leadership, therapy services are being held at this time until patient tests negative for COVID-19.   Deniece Ree PT, DPT, CBIS  Supplemental Physical Therapist [facility]    Pager [phone] Acute Rehab Office [phone]       [name] 03/12/2019, 1:45 PM

## 2019-03-13 DIAGNOSIS — Z20822 Contact with and (suspected) exposure to covid-19: Secondary | ICD-10-CM

## 2019-03-13 DIAGNOSIS — R6889 Other general symptoms and signs: Secondary | ICD-10-CM

## 2019-03-13 DIAGNOSIS — Z87891 Personal history of nicotine dependence: Secondary | ICD-10-CM

## 2019-03-13 DIAGNOSIS — R197 Diarrhea, unspecified: Secondary | ICD-10-CM

## 2019-03-13 LAB — RENAL FUNCTION PANEL
Albumin: 1.8 g/dL — ABNORMAL LOW (ref 3.5–5.0)
Anion gap: 15 (ref 5–15)
BUN: 62 mg/dL — ABNORMAL HIGH (ref 8–23)
CO2: 22 mmol/L (ref 22–32)
Calcium: 7.3 mg/dL — ABNORMAL LOW (ref 8.9–10.3)
Chloride: 96 mmol/L — ABNORMAL LOW (ref 98–111)
Creatinine, Ser: 9.61 mg/dL — ABNORMAL HIGH (ref 0.61–1.24)
GFR calc Af Amer: 6 mL/min — ABNORMAL LOW (ref >60)
GFR calc non Af Amer: 5 mL/min — ABNORMAL LOW (ref >60)
Glucose, Bld: 142 mg/dL — ABNORMAL HIGH (ref 70–99)
Phosphorus: 5.8 mg/dL — ABNORMAL HIGH (ref 2.5–4.6)
Potassium: 3.9 mmol/L (ref 3.5–5.1)
Sodium: 133 mmol/L — ABNORMAL LOW (ref 135–145)

## 2019-03-13 LAB — GASTROINTESTINAL PANEL BY PCR, STOOL (REPLACES STOOL CULTURE)

## 2019-03-13 LAB — CMV IGM: CMV IgM: <30 AU/mL (ref 0.0–29.9)

## 2019-03-13 LAB — CBC
HCT: 22.7 % — ABNORMAL LOW (ref 39.0–52.0)
HCT: 20.2 % — ABNORMAL LOW (ref 39.0–52.0)
Hemoglobin: 7 g/dL — ABNORMAL LOW (ref 13.0–17.0)
Hemoglobin: 5.9 g/dL — CL (ref 13.0–17.0)
MCH: 30.2 pg (ref 26.0–34.0)
MCH: 29.1 pg (ref 26.0–34.0)
MCHC: 30.8 g/dL (ref 30.0–36.0)
MCHC: 29.2 g/dL — ABNORMAL LOW (ref 30.0–36.0)
MCV: 97.8 fL (ref 80.0–100.0)
MCV: 99.5 fL (ref 80.0–100.0)
Platelets: 239 10*3/uL (ref 150–400)
Platelets: 232 10*3/uL (ref 150–400)
RBC: 2.32 MIL/uL — ABNORMAL LOW (ref 4.22–5.81)
RBC: 2.03 MIL/uL — ABNORMAL LOW (ref 4.22–5.81)
RDW: 14.6 % (ref 11.5–15.5)
RDW: 14.7 % (ref 11.5–15.5)
WBC: 6.1 10*3/uL (ref 4.0–10.5)
WBC: 4.6 10*3/uL (ref 4.0–10.5)
nRBC: 0.3 % — ABNORMAL HIGH (ref 0.0–0.2)
nRBC: 0.4 % — ABNORMAL HIGH (ref 0.0–0.2)

## 2019-03-13 LAB — GLUCOSE, CAPILLARY: Glucose-Capillary: 72 mg/dL (ref 70–99)

## 2019-03-13 LAB — HIV ANTIBODY (ROUTINE TESTING W REFLEX): HIV Screen 4th Generation wRfx: NONREACTIVE

## 2019-03-13 LAB — PATHOLOGIST SMEAR REVIEW

## 2019-03-13 LAB — C DIFFICILE QUICK SCREEN W PCR REFLEX
C Diff antigen: NEGATIVE
C Diff interpretation: NOT DETECTED
C Diff toxin: NEGATIVE

## 2019-03-13 MED ORDER — SODIUM CHLORIDE 0.9 % IV SOLN: 100.0000 mL | INTRAVENOUS | Status: DC | PRN

## 2019-03-13 MED ORDER — LIDOCAINE HCL (PF) 1 % IJ SOLN: 5.0000 mL | INTRAMUSCULAR | Status: DC | PRN

## 2019-03-13 MED ORDER — CALCITRIOL 1 MCG/ML IV SOLN
INTRAVENOUS | Status: AC
  Administered 2019-03-13: 2 ug
  Filled 2019-03-13: qty 2

## 2019-03-13 MED ORDER — ALTEPLASE 2 MG IJ SOLR: 2.0000 mg | Freq: Once | INTRAMUSCULAR | Status: DC | PRN

## 2019-03-13 MED ORDER — PRO-STAT SUGAR FREE PO LIQD
30.0000 mL | Freq: Two times a day (BID) | ORAL | Status: DC
  Administered 2019-03-13 (×2): 30 mL via ORAL
  Filled 2019-03-13 (×2): qty 30

## 2019-03-13 MED ORDER — HEPARIN SODIUM (PORCINE) 1000 UNIT/ML DIALYSIS: 1000.0000 [IU] | INTRAMUSCULAR | Status: DC | PRN

## 2019-03-13 MED ORDER — PENTAFLUOROPROP-TETRAFLUOROETH EX AERO: 1.0000 "application " | INHALATION_SPRAY | CUTANEOUS | Status: DC | PRN

## 2019-03-13 MED ORDER — LIDOCAINE-PRILOCAINE 2.5-2.5 % EX CREA: 1.0000 "application " | TOPICAL_CREAM | CUTANEOUS | Status: DC | PRN

## 2019-03-13 MED ORDER — HEPARIN SODIUM (PORCINE) 1000 UNIT/ML IJ SOLN
INTRAMUSCULAR | Status: AC
  Filled 2019-03-13: qty 1

## 2019-03-13 NOTE — Progress Notes (Signed)
   Subjective: Patient reports feeling the same this morning. He reports he has been having diarrhea for the past week, roughly 6 loose stools a day. His last episode was this morning. Denies any abdominal pain n/v. He continues to have pain in his right AKA mostly when he moves.  Objective:  Vital signs in last 24 hours: Vitals:   03/12/19 2128 03/13/19 0300 03/13/19 0623 03/13/19 0658  BP: 136/62  133/64   Pulse: (!) 101  92   Resp: 17  14   Temp: (!) 100.8 F (38.2 C) 98.6 F (37 C) 98.3 F (36.8 C)   TempSrc: Oral Oral Oral   SpO2: 95%     Weight:    69.8 kg  Height:       Physical Exam Gen: seen comfortably lying in bed, no distress CV: RRR, no murmurs Abdomen: soft, non-tender, non-distended, bowel sounds present Ext: right aka, clean and dry incision, left LE no edema, dry, warm and well perfused   Assessment/Plan:  Principal Problem:   Fever, unspecified Active Problems:   Type 2 diabetes mellitus with stage 5 chronic kidney disease (HCC)   Essential hypertension   Peripheral vascular occlusive disease (HCC)   Chronic systolic heart failure (HCC)   Anemia   Critical lower limb ischemia   Elevated transaminase level  Mr. Belloso a 73 yo M with a pmhx of HTN, Type II DM, PVD s/p right transmetatarsal amputation, ESRD on HD, and CAD s/p CABG who was presented with worsening pain of his right foot.  Ischemic Right Foot, probableosteomyelitis:S/p AKA on 3/20.  -- PT/OT not seeing patients with potential positive or confirmed COVID-19; following acutely for now  -- CIR consult  Fevers:Patient spiked one fever last night, 100.8. He reports diarrhea for the past week, last episode this morning. Given his recent antibiotic use there is a risk of c diff which may also explain his fevers. Repeat blood cultures have shown NGTD.Transaminitis has improved since admission. No other potential source at this point. Will discuss with ID on possible c diff testing vs  COVID-19.  -- ID on board, appreciate recommendations  -- Repeat blood cultures NGTD -- Consider C diff testing   Anemia:Hgb stable at 7.0. -- Follow CBC --Aranesp per nephrology   ESRD on HD (MWF) -- nephrology consulted; appreciate assistance  Dispo: Anticipated discharge pending clinical improvement.  Mike Craze, DO 03/13/2019, 10:13 AM Pager: [phone]

## 2019-03-13 NOTE — Progress Notes (Signed)
Kevin Castillo for Infectious Disease  Date of Admission:  03/06/2019     Total days of antibiotics 5 - last dose Vanc/Ceftriaxone 3/23           ASSESSMENT: Temperature last PM 100.8 F which responded again to one dose of tylenol again. He has been placed on enteric precautions due to report to his nurse last night that he had had > 5 episodes of diarrhea that day. He has been hospitalized for prolonged period of time twice now during the month of March (3/10 - 3/25) with exposure to broad spectrum antibiotics.   It is still unclear as to the cause of his nocturnal intermittent fevers at this time. No acute or evolving respiratory complaints, evidence of hypoxia and no acute findings on CXR. Please discontinue droplet precautions. With now evolving diarrhea will continue enteric precautions and send CDiff panel. GI panel is not likely going to provide helpful information being he has had little exposure to community over the last 3 weeks.   PLAN: 1. CDiff panel for stool (will call to see if we can add on to last night's sample) 2. Continue Enteric precautions 3. Stop droplet precautions.    Principal Problem:   Fever, unspecified Active Problems:   Type 2 diabetes mellitus with stage 5 chronic kidney disease (HCC)   Essential hypertension   Peripheral vascular occlusive disease (HCC)   Chronic systolic heart failure (HCC)   Anemia   Critical lower limb ischemia   Elevated transaminase level   . sodium chloride   Intravenous Once  . sodium chloride   Intravenous Once  . atorvastatin  20 mg Oral Daily  . calcitRIOL  2 mcg Oral Q M,W,F-HD  . calcium acetate  667 mg Oral TID WC  . Chlorhexidine Gluconate Cloth  6 each Topical Q0600  . epoetin (EPOGEN/PROCRIT) injection  20,000 Units Intravenous Q M,W,F-HD  . feeding supplement (PRO-STAT SUGAR FREE 64)  30 mL Oral BID  . finasteride  5 mg Oral Daily  . multivitamin  1 tablet Oral QHS  . sodium chloride flush  3 mL  Intravenous Q12H    SUBJECTIVE: Noted to have had 5-6 episodes of diarrhea yesterday although he tells [place] today that he has not had a bowel movement in days. No new symptoms.   I spoke with is daughter Macky Lower and clarified his timeline over the last 3 weeks prior to admission - She reports that he drove to [place] as a passenger in her car after morning HD session on Monday March 9th. He had a consultation with another office about his right foot on 3/10 and promptly admitted to the local hospital where he was "treated for infection of his foot" over the next 8 days with antibiotics until after his HD session on 3/18 when he left for  and was then admitted 3/19 for amputation. She denied any knowledge or suspicion of fevers during hospitalization prior to [facility]. She validates that he has a chronic smoker's cough that he has had for well over a year.   Review of Systems: Review of Systems  Constitutional: Positive for fever. Negative for chills, diaphoresis and malaise/fatigue.  HENT: Negative for sinus pain and sore throat.   Respiratory: Positive for cough (chronic). Negative for sputum production, shortness of breath and wheezing.   Cardiovascular: Negative for chest pain and leg swelling.  Gastrointestinal: Negative for abdominal pain, diarrhea and vomiting.  Musculoskeletal: Negative for myalgias.  Allergies  Allergen Reactions  . Nicotine Transdermal System [Nicotine] Other (See Comments)    Vivid dreams    OBJECTIVE: Vitals:   03/12/19 2128 03/13/19 0300 03/13/19 0623 03/13/19 0658  BP: 136/62  133/64   Pulse: (!) 101  92   Resp: 17  14   Temp: (!) 100.8 F (38.2 C) 98.6 F (37 C) 98.3 F (36.8 C)   TempSrc: Oral Oral Oral   SpO2: 95%     Weight:    69.8 kg  Height:       Body mass index is 24.1 kg/m.  Physical Exam Constitutional:      General: He is not in acute distress.    Appearance: Normal appearance. He is not toxic-appearing.     Comments: Resting in bed  comfortably.   HENT:     Mouth/Throat:     Mouth: Mucous membranes are moist.     Pharynx: Oropharynx is clear.     Comments: Very poor dentition, multiple missing teeth.  Eyes:     General: No scleral icterus.    Conjunctiva/sclera: Conjunctivae normal.  Cardiovascular:     Rate and Rhythm: Normal rate.     Heart sounds: Murmur (systolic) present.  Pulmonary:     Effort: Pulmonary effort is normal.     Breath sounds: Rhonchi (few coarse rhonchi) present. No wheezing.  Abdominal:     General: Bowel sounds are normal. There is no distension.     Tenderness: There is no abdominal tenderness.  Musculoskeletal:        General: Tenderness (R AKA stump. ) present.     Comments: R AKA incision line is clean/dry.   Lymphadenopathy:     Cervical: No cervical adenopathy.  Skin:    Comments: LLE foot cool. Dry flaking skin, no breakdown.   Neurological:     Mental Status: He is alert and oriented to person, place, and time.     Lab Results Lab Results  Component Value Date   WBC 6.1 03/13/2019   HGB 7.0 (L) 03/13/2019   HCT 22.7 (L) 03/13/2019   MCV 97.8 03/13/2019   PLT 239 03/13/2019    Lab Results  Component Value Date   CREATININE 9.20 (H) 03/11/2019   BUN 61 (H) 03/11/2019   NA 132 (L) 03/11/2019   K 3.7 03/11/2019   CL 95 (L) 03/11/2019   CO2 20 (L) 03/11/2019    Lab Results  Component Value Date   ALT 59 (H) 03/12/2019   AST 101 (H) 03/12/2019   ALKPHOS 56 03/12/2019   BILITOT 0.5 03/12/2019     Microbiology: Recent Results (from the past 240 hour(s))  Culture, blood (routine x 2)     Status: None   Collection Time: 03/06/2019  4:40 PM  Result Value Ref Range Status   Specimen Description BLOOD LEFT HAND  Final   Special Requests   Final    BOTTLES DRAWN AEROBIC AND ANAEROBIC Blood Culture results may not be optimal due to an excessive volume of blood received in culture bottles   Culture   Final    NO GROWTH 5 DAYS Performed at [facility],  [place] [address], [place], [place] [address]    Report Status 03/12/2019 FINAL  Final  Culture, blood (routine x 2)     Status: None   Collection Time: 02/27/2019  6:10 PM  Result Value Ref Range Status   Specimen Description BLOOD LEFT FOREARM  Final   Special Requests  Final    BOTTLES DRAWN AEROBIC AND ANAEROBIC Blood Culture adequate volume   Culture   Final    NO GROWTH 5 DAYS Performed at [facility], [place] [address], [place], [place] [address]    Report Status 03/12/2019 FINAL  Final  Surgical pcr screen     Status: None   Collection Time: 03/06/2019 11:33 PM  Result Value Ref Range Status   MRSA, PCR NEGATIVE NEGATIVE Final   Staphylococcus aureus NEGATIVE NEGATIVE Final    Comment: (NOTE) The Xpert SA Assay (FDA approved for NASAL specimens in patients 79 years of age and older), is one component of a comprehensive surveillance program. It is not intended to diagnose infection nor to guide or monitor treatment. Performed at [facility], [place] [address], [place], [place] [address]   Respiratory Panel by PCR     Status: None   Collection Time: 02/23/2019  9:26 AM  Result Value Ref Range Status   Adenovirus NOT DETECTED NOT DETECTED Final   Coronavirus 229E NOT DETECTED NOT DETECTED Final    Comment: (NOTE) The Coronavirus on the Respiratory Panel, DOES NOT test for the novel  Coronavirus (2019 nCoV)    Coronavirus HKU1 NOT DETECTED NOT DETECTED Final   Coronavirus NL63 NOT DETECTED NOT DETECTED Final   Coronavirus OC43 NOT DETECTED NOT DETECTED Final   Metapneumovirus NOT DETECTED NOT DETECTED Final   Rhinovirus / Enterovirus NOT DETECTED NOT DETECTED Final   Influenza A NOT DETECTED NOT DETECTED Final   Influenza B NOT DETECTED NOT DETECTED Final   Parainfluenza Virus 1 NOT DETECTED NOT DETECTED Final   Parainfluenza Virus 2 NOT DETECTED NOT DETECTED Final   Parainfluenza Virus 3 NOT DETECTED NOT DETECTED Final   Parainfluenza Virus 4 NOT DETECTED NOT DETECTED  Final   Respiratory Syncytial Virus NOT DETECTED NOT DETECTED Final   Bordetella pertussis NOT DETECTED NOT DETECTED Final   Chlamydophila pneumoniae NOT DETECTED NOT DETECTED Final   Mycoplasma pneumoniae NOT DETECTED NOT DETECTED Final    Comment: Performed at [facility], [place]. [address] [place], [place] [address]  Culture, blood (routine x 2)     Status: None (Preliminary result)   Collection Time: 03/10/19  7:46 AM  Result Value Ref Range Status   Specimen Description BLOOD LEFT ANTECUBITAL  Final   Special Requests   Final    BOTTLES DRAWN AEROBIC ONLY Blood Culture adequate volume   Culture   Final    NO GROWTH 3 DAYS Performed at [facility], [place] [address] [place], [place] [address]    Report Status PENDING  Incomplete  Culture, blood (routine x 2)     Status: None (Preliminary result)   Collection Time: 03/10/19  7:46 AM  Result Value Ref Range Status   Specimen Description BLOOD LEFT ARM  Final   Special Requests   Final    BOTTLES DRAWN AEROBIC ONLY Blood Culture adequate volume   Culture   Final    NO GROWTH 3 DAYS Performed at [facility], [address], [place], [place] [address]    Report Status PENDING  Incomplete    Janene Madeira, MSN, NP-C [place] for Infectious [place] Cell: [phone] Pager: [phone]  03/13/2019  11:08 AM

## 2019-03-13 NOTE — Progress Notes (Addendum)
[facility] Progress Note   Dialysis Orders: 4h 53min 400/800 76kg 2/2 bath TDC / R BC AVF (created Aug '19, using both) Heparin 7000units IV TIW - M 50 ugIVevery 2 wks, last dose was 01/23/19  Assessment/Plan: 1. Right AKA 3/20 for right leg ischemia -  2. ESRD -MWF - HD todaystart with added K bath have been holding heparin- have been using 1: 1 per previous orders- Monday used 16 yesterday at 250 - HD today 3. Anemia/ABLA/JW - hgb pre HD 6.6 >7.8 >7 stable- post HD  EPO for support tsat 16% with ferritin 6124 3/20 - presurgery - with acute blood loss, Fe should be even lower-- recheck levels with HD Wed -  4. Secondary hyperparathyroidism - no VDRA or binders a present - follow labs. 5. HTN/volume - lower edw for d/c - due to AKA and also weight loss from poor intake - net UF 1 L Monday; check labs pre HD today 6. Nutrition - alb low 2.0 poor intake - fevers/high risk for poor wound healing -  needs encouragement with eating and setting up by staff/ added porstat but needs kcal  7. Fevers  -spike to 100.8 less the past 24 hr  CRP 19.8 followed by ID/primary- no overt source - following off antibiotics [facility] 3/22 pending no growth WBC ok CXR 3/24 small right pleural effusion. On contact and droplet precautions. 8. DM   Myriam Jacobson, PA-C [place] [phone] 03/13/2019,9:17 AM  LOS: 6 days   Subjective:   Discussed with residents. Pt now with diarrhea.  Ultimate plan is d/c to rehab facility/SNF  Objective Vitals:   03/12/19 2128 03/13/19 0300 03/13/19 0623 03/13/19 0658  BP: 136/62  133/64   Pulse: (!) 101  92   Resp: 17  14   Temp: (!) 100.8 F (38.2 C) 98.6 F (37 C) 98.3 F (36.8 C)   TempSrc: Oral Oral Oral   SpO2: 95%     Weight:    69.8 kg  Height:       Physical Exam General: supine NAD - for dialysis later today  Additional Objective Labs: Basic Metabolic Panel: Recent Labs  Lab 03/11/2019 0337 03/10/19 0746  03/11/19 0549  NA 135 133* 132*  K 4.8 3.5 3.7  CL 98 95* 95*  CO2 18* 23 20*  GLUCOSE 80 111* 91  BUN 42* 46* 61*  CREATININE 7.76* 7.68* 9.20*  CALCIUM 8.1* 7.5* 7.5*   Liver Function Tests: Recent Labs  Lab 02/25/2019 1643 03/17/2019 0337 03/12/19 1022  AST 191* 166* 101*  ALT 73* 72* 59*  ALKPHOS 68 69 56  BILITOT 0.5 0.6 0.5  PROT 8.3* 7.8 7.1  ALBUMIN 2.5* 2.2* 2.0*   No results for input(s): LIPASE, AMYLASE in the last 168 hours. CBC: Recent Labs  Lab 02/24/2019 1643  03/10/19 0746 03/11/19 0549 03/12/19 0347 03/12/19 1022 03/13/19 0742  WBC 18.6*   < > 7.4 7.3 7.2 7.1 6.1  NEUTROABS 15.9*  --   --   --   --  5.6  --   HGB 7.8*   < > 5.6* 6.6* 7.8* 6.8* 7.0*  HCT 25.4*   < > 18.9* 21.8* 24.9* 22.7* 22.7*  MCV 99.6   < > 97.4 97.8 96.9 97.8 97.8  PLT 373   < > 299 306 229 243 239   < > = values in this interval not displayed.   Blood Culture    Component Value Date/Time   SDES  BLOOD LEFT ANTECUBITAL 03/10/2019 0746   SDES BLOOD LEFT ARM 03/10/2019 0746   SPECREQUEST  03/10/2019 0746    BOTTLES DRAWN AEROBIC ONLY Blood Culture adequate volume   SPECREQUEST  03/10/2019 0746    BOTTLES DRAWN AEROBIC ONLY Blood Culture adequate volume   CULT  03/10/2019 0746    NO GROWTH 2 DAYS Performed at [facility], [place] [address], [place], [place] [address]    CULT  03/10/2019 0746    NO GROWTH 2 DAYS Performed at [facility], [place] [address], [place], [place] [address]    REPTSTATUS PENDING 03/10/2019 0746   REPTSTATUS PENDING 03/10/2019 0746    Cardiac Enzymes: No results for input(s): CKTOTAL, CKMB, CKMBINDEX, TROPONINI in the last 168 hours. CBG: Recent Labs  Lab 02/22/2019 1454 02/24/2019 1518 02/24/2019 1608 03/04/2019 1650 03/03/2019 2224  GLUCAP 142* 140* 156* 161* 87   Iron Studies: No results for input(s): IRON, TIBC, TRANSFERRIN, FERRITIN in the last 72 hours. Lab Results  Component Value Date   INR 1.1 03/09/2019   INR 1.18 07/19/2018    INR 1.0 03/09/2015   Studies/Results: Dg Chest 2 View  Result Date: 03/12/2019 CLINICAL DATA:  Fever, wheezing, recent RIGHT above knee amputation EXAM: CHEST - 2 VIEW COMPARISON:  03/10/2019 FINDINGS: RIGHT jugular central venous catheter with tip projecting at high RIGHT atrium. Enlargement of cardiac silhouette post CABG. Atherosclerotic calcifications aorta. Mediastinal contours and pulmonary vascularity normal. RIGHT basilar atelectasis and small pleural effusion. Remaining lungs clear. No acute infiltrate or pneumothorax. Bones demineralized. IMPRESSION: Enlargement of cardiac silhouette post CABG. Small RIGHT pleural effusion and RIGHT basilar atelectasis. Electronically Signed   By: Lavonia Dana M.D.   On: 03/12/2019 11:57   Medications:  . sodium chloride   Intravenous Once  . sodium chloride   Intravenous Once  . atorvastatin  20 mg Oral Daily  . calcitRIOL  2 mcg Oral Q M,W,F-HD  . calcium acetate  667 mg Oral TID WC  . Chlorhexidine Gluconate Cloth  6 each Topical Q0600  . epoetin (EPOGEN/PROCRIT) injection  20,000 Units Intravenous Q M,W,F-HD  . finasteride  5 mg Oral Daily  . multivitamin  1 tablet Oral QHS  . sodium chloride flush  3 mL Intravenous Q12H         I have seen this patient and agree with plan and assessment in the above note with renal recommendations/intervention highlighted.  Broadus John A Janara Klett,MD 03/13/2019 11:23 AM

## 2019-03-13 NOTE — Progress Notes (Signed)
Internal Medicine Attending  Date: 03/13/2019  Patient name: Kevin Toman Sr. Medical record number: [id number] Date of birth: 01-Mar-1946 Age: 73 y.o. Gender: male  I saw and evaluated the patient. I reviewed the resident's note by Dr. Laural Golden and I agree with the resident's findings and plans as documented in her progress note.  When seen on rounds this morning Kevin Castillo had no complaints although when specifically asked about diarrhea he notes he has had 5-6 bowel movements a day for the last week.  Given the extensive antibiotics he was on prior to admission a C. difficile was sent and negative.  A repeat chest x-ray showed mild improvement in his right basilar atelectasis with chronic right basilar changes present before admission.  His surgical wound is clean and dry without any tenderness or fluctuance.  He is therefore being transferred for COVID-19 suspicion and will be tested.  Disposition to a skilled nursing facility is pending the results of this testing which is expected to take several days.

## 2019-03-13 NOTE — Progress Notes (Addendum)
Notified by lab Kathrine pt. Hgb 5.9 paged to notify nephrology Alric Seton. Awaiting return call. This nurse asked pt. If he is ok with receiving blood products pt. Declined pt. Jehovah Witness  no consent obtained. Awaiting to notify nephrology of events.

## 2019-03-13 NOTE — TOC Initial Note (Signed)
Transition of Care (TOC) - Initial/Assessment Note    Patient Details  Name: Kevin Cocozza Sr. MRN: [id number] Date of Birth: 1946-02-22  Transition of Care [facility]) CM/SW Contact:    Sharin Mons, RN Phone Number: 03/13/2019, 9:35 AM  Clinical Narrative:       Admitted with fever unspecified, R leg pain. Hx of  ESRD/ HD, PVD, HFrEF, CAD, COPD, DM, and alcohol. From home with wife.  PTA independent with ADL's, no DME usage.       Kevin Castillo (Spouse) Kevin Castillo (Daughter)     [phone]     PCP: Oval Linsey  Expected Discharge Plan: IP Rehab Facility Barriers to Discharge: Continued Medical Work up   Patient Goals and CMS Choice Patient states their goals for this hospitalization and ongoing recovery are:: To get better and go home CMS Medicare.gov Compare Post Acute Care list provided to:: Patient Choice offered to / list presented to : Patient, Spouse  Expected Discharge Plan and Services Expected Discharge Plan: [place] In-house Referral: Clinical Social Work Discharge Planning Services: CM Consult   Living arrangements for the past 2 months: Single Family Home                 DME Arranged: N/A DME Agency: NA HH Arranged: NA [place] Agency: NA  Prior Living Arrangements/Services Living arrangements for the past 2 months: Single Family Home Lives with:: Spouse Patient language and need for interpreter reviewed:: Yes Do you feel safe going back to the place where you live?: Yes      Need for Family Participation in Patient Care: Yes (Comment) Care giver support system in place?: Yes (comment)(wife)   Criminal Activity/Legal Involvement Pertinent to Current Situation/Hospitalization: No - Comment as needed  Activities of Daily Living Home Assistive Devices/Equipment: None ADL Screening (condition at time of admission) Patient's cognitive ability adequate to safely complete daily activities?: Yes Is the patient deaf or have  difficulty hearing?: No Does the patient have difficulty seeing, even when wearing glasses/contacts?: No Does the patient have difficulty concentrating, remembering, or making decisions?: No Patient able to express need for assistance with ADLs?: Yes Does the patient have difficulty dressing or bathing?: No Independently performs ADLs?: Yes (appropriate for developmental age) Does the patient have difficulty walking or climbing stairs?: Yes Weakness of Legs: Right Weakness of Arms/Hands: None  Permission Sought/Granted Permission sought to share information with : Case Manager Permission granted to share information with : Yes, Verbal Permission Granted  Share Information with NAME: Kevin Castillo (Spouse)           Emotional Assessment Appearance:: Appears stated age Attitude/Demeanor/Rapport: Engaged Affect (typically observed): Accepting Orientation: : Oriented to Self, Oriented to Place, Oriented to  Time, Oriented to Situation Alcohol / Substance Use: Tobacco Use Psych Involvement: No (comment)  Admission diagnosis:  Lower limb ischemia [I99.8] Sepsis without acute organ dysfunction, due to unspecified organism [facility]) [A41.9] Patient Active Problem List   Diagnosis Date Noted  . Fever   . Postoperative fever 03/10/2019  . Fever, unspecified 03/02/2019  . Elevated transaminase level 02/21/2019  . Critical lower limb ischemia 03/04/2019  . Anemia 09/25/2018  . Acute on chronic left systolic heart failure ([place]) 07/18/2018  . Rib pain on left side 07/12/2018  . Anemia of chronic kidney failure, stage 5 ([place]) 07/28/2017  . Secondary hyperparathyroidism of renal origin ([place]) 07/28/2017  . Type 2 diabetes mellitus with mild nonproliferative diabetic retinopathy with macular edema, bilateral ([place]) 10/28/2016  . Chronic  systolic heart failure ([place]) 01/15/2016  . Sigmoid diverticulosis 01/15/2016  . Tubular adenoma of colon 01/11/2016  . Benign prostatic hyperplasia 06/02/2014   . Allergic rhinitis 04/09/2014  . Peripheral vascular occlusive disease ([place]) 12/30/2013  . Chronic kidney disease, stage 5 ([place]) 11/12/2013  . Healthcare maintenance 11/12/2013  . History of venous thromboembolism 11/11/2013  . Type 2 diabetes mellitus with stage 5 chronic kidney disease ([place]) 02/15/2007  . Hyperlipidemia 02/15/2007  . Obesity (BMI 30.0-34.9) 02/15/2007  . Erectile dysfunction associated with type 2 diabetes mellitus ([place]) 02/15/2007  . Alcohol abuse 02/15/2007  . Tobacco abuse 02/15/2007  . Essential hypertension 02/15/2007  . Coronary artery disease involving autologous vein coronary bypass graft with angina pectoris ([place]) 02/15/2007   PCP:  Oval Linsey, MD Pharmacy:   [facility] [place], [place] [address] [place] [address] Phone: [phone] Fax: [phone]     Social Determinants of Health (SDOH) Interventions    Readmission Risk Interventions No flowsheet data found.

## 2019-03-13 NOTE — Progress Notes (Signed)
Pt admitted to room 2W39 from hemodialysis. Bed locked and in lowest position. Call bell within reach. Admission not completed. Report given to oncoming RN.

## 2019-03-13 NOTE — Progress Notes (Signed)
OT cancellation note.  Per health system leadership, therapy services are being held at this time until patient tests negative for COVID-19.     Ebony Hail Harold Hedge) Marsa Aris OTR/L Acute Rehabilitation Services Pager: [phone] Office: [phone]

## 2019-03-13 NOTE — Progress Notes (Signed)
PT Cancellation Note  Patient Details Name: Kevin Greenstreet Sr. MRN: [id number] DOB: 1946-09-03   Cancelled Treatment:    Reason Eval/Treat Not Completed: Other (comment) continuing to await updates/guidance on if this patient will receive Covid-19 testing. Per guidance of hospital administration, PT is not to see patients with potential positive or confirmed positive Covid-19 tests at this time. Continue to note concerns from ID MDs per chart review. Will follow acutely.    Deniece Ree PT, DPT, CBIS  Supplemental Physical Therapist [facility]    Pager [phone] Acute Rehab Office [phone]

## 2019-03-13 NOTE — Progress Notes (Signed)
Patient ID: Kevin Kutz Sr., male   DOB: 1945/12/24, 73 y.o.   MRN: [id number]          [place] for Infectious Disease    Date of Admission:  03/06/2019     Kevin Castillo continues to have unexplained fevers.  His stool C. difficile screen was negative.  He has a chronic, unchanged cough and denies shortness of breath.  Influenza and respiratory virus panels have been negative.  I am not convinced that he is at high risk for COVID-19, but I do recommend isolation and testing for it now based on the CDC's testing recommendations listed below.  "Clinicians should use their judgment to determine if a patient has signs and symptoms compatible with COVID-19 and whether the patient should be tested. Most patients with confirmed COVID-19 have developed fever and/or symptoms of acute respiratory illness (e.g., cough, difficulty breathing)."         Michel Bickers, MD [facility] [phone] pager   [phone] cell 03/13/2019, 12:06 PM

## 2019-03-14 ENCOUNTER — Encounter (HOSPITAL_COMMUNITY): Payer: Self-pay

## 2019-03-14 LAB — GLUCOSE, CAPILLARY: Glucose-Capillary: 79 mg/dL (ref 70–99)

## 2019-03-15 LAB — CULTURE, BLOOD (ROUTINE X 2)
Culture: NO GROWTH
Culture: NO GROWTH
Special Requests: ADEQUATE
Special Requests: ADEQUATE

## 2019-03-15 LAB — NOVEL CORONAVIRUS, NAA (HOSP ORDER, SEND-OUT TO REF LAB; TAT 18-24 HRS): SARS-CoV-2, NAA: NOT DETECTED

## 2019-03-20 NOTE — Progress Notes (Signed)
Code [place] called. IVT present. Primary RN states that the patient has 2 working IV's. Will remain available as needed.

## 2019-03-20 NOTE — Progress Notes (Signed)
Numerous phone calls and attempts from 2100 until 2310. Family different family members have talked to me and to the pt in which each one received the basic info from me why the pt was transferred and the pt then told them a little more. The different family members wanted more info from me. I explained I couldn't give them more info and why and that I have attempted to get the pt to establish a password so that I could give them info but he wouldn't. At the phone call at 2300 the woman on the phone was requesting more info like what test results showed,why pt was transferred, how he was doing, what he ate for dinner, was he still confused. I gave the phone to the pt and he talked to the woman and established a password of [id number]. I talked to the woman and told her he was only confused to the month and the extent of why he was at the hospital but after cueing he said he had surgery on his leg, why he had surgery, and what they did. I didn't know what pt ate for dinner because he had it on the other floor, I explained why he was transferred to this floor and that the testing for that transfer R/O Covid 19 wouldn't be back for at least 36 hrs. Pt may be dischg'd by then but probably not and he will stay on this unit until dischg or the test comes back negative. I explained pt is on 2 [place] now. All other questions answered. Family members also were calling non stop on pt's cell phone and he told them he couldn't talk at that time because I was busy cleaning him up and within 1-2 minutes they were calling my phone.

## 2019-03-20 NOTE — Progress Notes (Addendum)
Responded to Code Blue for this patient at approximately 3:45. See separate code documentation from intern for details. After approximately 40 minutes of ACLS patient remained pulseless with time of death of 4:27am. Spouse was contacted by phone by nursing staff and she spoke with the patient's daughter. I subsequently spoke with the patients daughter, but she was understandably distraught and we were unable to discuss details at that time. The line ultimately was cut off. Nursing staff instructed to page IMTS when family arrives as they will likely have questions.  Pearson Grippe, DO IM PGY-2  ADDENDUM: Paged that patients family had arrived at 4:45am. Spoke with family at beside, informing them of the events leading up to the patient's cardiac arrest and the subsequent code that followed. Informed patient of recent lab work, variable Hgb (including a 5.9 this afternoon); but that exact cause could not be determined at this time. Answered their question about his floor transfer, informing them this was due to his COVID-19 testing. No further questions at that time. Will allow family to grieve, nursing staff is to page if family has further questions. Chaplain had been contacted and should be coming by.  Pearson Grippe, DO IM PGY-2

## 2019-03-20 NOTE — Progress Notes (Addendum)
0347: Call from [facility]. Pt was 120 HR now 20 and junctional. PPE applied r/t Covid 19 Isolation. Pt found unresponsive without Pulse or Respirations. CPR started. Code Blue called.  0349: Code Team arrived. See Code Record.  0430: Family arrived (wife and daughter)

## 2019-03-20 NOTE — Progress Notes (Signed)
Pt checked every 2 hours and was asleep. No distress noted. Last checked at 0200 and pt was asleep.

## 2019-03-20 NOTE — Procedures (Signed)
Intubation Procedure Note Kellon Chalk Sr. [id number] [birth year]  Procedure: Intubation Indications: Respiratory insufficiency  Procedure Details Consent: Unable to obtain consent because of emergent medical necessity. Time Out: Verified patient identification, verified procedure, site/side was marked, verified correct patient position, special equipment/implants available, medications/allergies/relevent history reviewed, required imaging and test results available.  Performed  Maximum sterile technique was used including antiseptics, gloves, gown, hand hygiene and mask.  MAC and 4    Evaluation Hemodynamic Status: Unstable ; O2 sats: Unstable Patient's Current Condition: unstable Complications: No apparent complications Patient did tolerate procedure well. Chest X-ray ordered to verify placement. Pending. tube position acceptable per ETCO2 and Bilateral Breath Sounds.  Patient Intubated by Self in a Code Blue, ACLS protocol initiated. Positive color change noted. BBS confirmed. No apparent complications noted. Successful intubation one attempt.    Leigh Aurora, B.S, RRT, RCP 04-03-19

## 2019-03-20 NOTE — Progress Notes (Signed)
Doctor here to talk to family.

## 2019-03-20 NOTE — Progress Notes (Addendum)
Called and spoke with patient's spouse Apollo Timothy and made her aware that there has been a change in patient's status and that patient is actively receiving CPR and asked her to come to hospital to ED entrance and that only one person is allowed to come in to visit.  Spouse tearful and verbally distraught on the phone.  RN offered to call her daughter for her but Posey Pronto declined and stated she would call her and get her daughter to bring her to the hospital.

## 2019-03-20 NOTE — Death Summary Note (Signed)
  Name: Kevin Economou Sr. MRN: [id number] DOB: 05-26-1946 73 y.o.  Date of Admission: 02/22/2019  3:39 PM Date of Discharge: 03/16/2019 Attending Physician: Lenice Pressman MD   Discharge Diagnosis: Unspecified fever Osteomyelitis right lower extremity  Ischemic right lower extremity  PVD Acute on chronic anemia COVID-19 rule out   Cause of death: PEA arrest  Time of death: 4:27 am  Disposition and follow-up:   [name] was discharged from [facility] in expired condition.    Hospital Course:  [name] presented to the hospital with right leg pain. He has known PVD and is followed by Dr. Donzetta Matters with vascular surgery.  He recently developed toe ulceration and underwent angiogram.  He was recommended to undergo an above-the-knee amputation.  His family wanted a second opinion and they decided to go to a provider in [place].  The patient subsequently drove to [place] where he had an 8-day hospitalization which included 2 arteriograms of his right leg.  Providers in [place] recommended leg amputation.  He was then driven back to [place] and continued to have right leg pain so he came into the hospital.  On admission, he was febrile and septic. He was started on vancomycin and ceftriaxone and vascular surgery performed a right above knee amputation on 03/05/2019. His hospital course was complicated by anemia and fevers. He denied blood transfusions because he was a [place] witness and his DVT prophylaxis was held in the setting of anemia. He continued to spike fevers 5 days after his surgery and his infectious workup was negative. He was considered for COVID-19 rule out, however the patient unfortunately expired before the test was collected. There was concern for a possible PE given he was not being mobilized and unable to get DVT prophylaxis in the setting of his anemia. Patient had a PEA arrest.    Signed: Bentzion Dauria N, DO 03/16/2019, 8:54 AM

## 2019-03-20 NOTE — Progress Notes (Signed)
While pt was in dialysis, 2W notified that pt is to be transferred to [facility]. Before approving the transfer into the non icu covid unit, this RN reviewed this pt's chart. A decrease in hgb was noted as well as a note stating pt refusing prbcs at this time for religous purposes. This RN also noted that charted pt blood pressures were low. At this time I then contacted service and requested pt be moved to ICU instead of transferred to 2W. Current Covid-19 placement algorithm states if pt is hemodynamically unstable they are sent to ICU. This was reviewed and discussed with service. Resident stated she would speak to attending and contact critical care. Per callback resident stated that she spook with dialysis nurse taking care of pt and blood pressures had improved and pt would not require ICU admission at this time. Pt arrived to 2W at 1901 from dialysis.

## 2019-03-20 NOTE — Progress Notes (Signed)
Pt left unit per [facility] to be taken to [place]. Family to call later with name of [facility] and number.

## 2019-03-20 NOTE — Code Documentation (Signed)
CODE BLUE NOTE  Patient Name: Kevin Mendolia Sr.   MRN: [id number]   Date of Birth/ Sex: 08/05/1946 , male      Admission Date: 03/13/2019  Attending Provider: Oda Kilts, MD  Primary Diagnosis: Lower limb ischemia [I99.8] Sepsis without acute organ dysfunction, due to unspecified organism [facility]) [A41.9]    Indication: Pt was in his usual state of health until this AM, when he was noted to be found unresponsive with junctional rhythm of 20 bpm by his nurse. Code blue was subsequently called. At the time of arrival on scene, ACLS protocol was underway.  Patient is ESRD, received HD today.  Patient has been febrile, was suspected for having CO VID, although labs pending.  Also has been anemic with most recent hemoglobin of 5.6.  Patient is Jehovah's Witness and did not receive blood products.  Patient found unresponsive by nurse in junctional bradycardia of approximately 20 bpm.  Patient then progressed to PEA arrest.  Patient was intubated, epinephrine was given x6, sodium bicarb given x2.  Patient never recovered from PEA arrest.   Technical Description:  - CPR performance duration:   34 minute  - Was defibrillation or cardioversion used? No   - Was external pacer placed? No  - Was patient intubated pre/post CPR? Yes    Medications Administered: Y = Yes; Blank = No Amiodarone    Atropine    Calcium    Epinephrine  y  Lidocaine    Magnesium    Norepinephrine    Phenylephrine    Sodium bicarbonate  y  Vasopressin    Other     Post CPR evaluation:  - Final Status - Was patient successfully resuscitated ? No   Miscellaneous Information:  - Time of death:   2023-03-17: 24 AM  - Primary team notified?  Yes  - Family Notified? Yes        Benay Pike, MD   04-08-19, 4:27 AM

## 2019-03-20 DEATH — deceased

## 2019-05-30 ENCOUNTER — Ambulatory Visit: Payer: Medicare HMO | Admitting: Podiatry

## 2020-06-07 IMAGING — DX DG RIBS 2V*L*
3 series · 3 of 3 positions shown · non-contrast
Comparison: 10/28/2016

CLINICAL DATA: Left posterior rib pain

EXAM:
LEFT RIBS - 2 VIEW

[rib ap (1 of 2)]
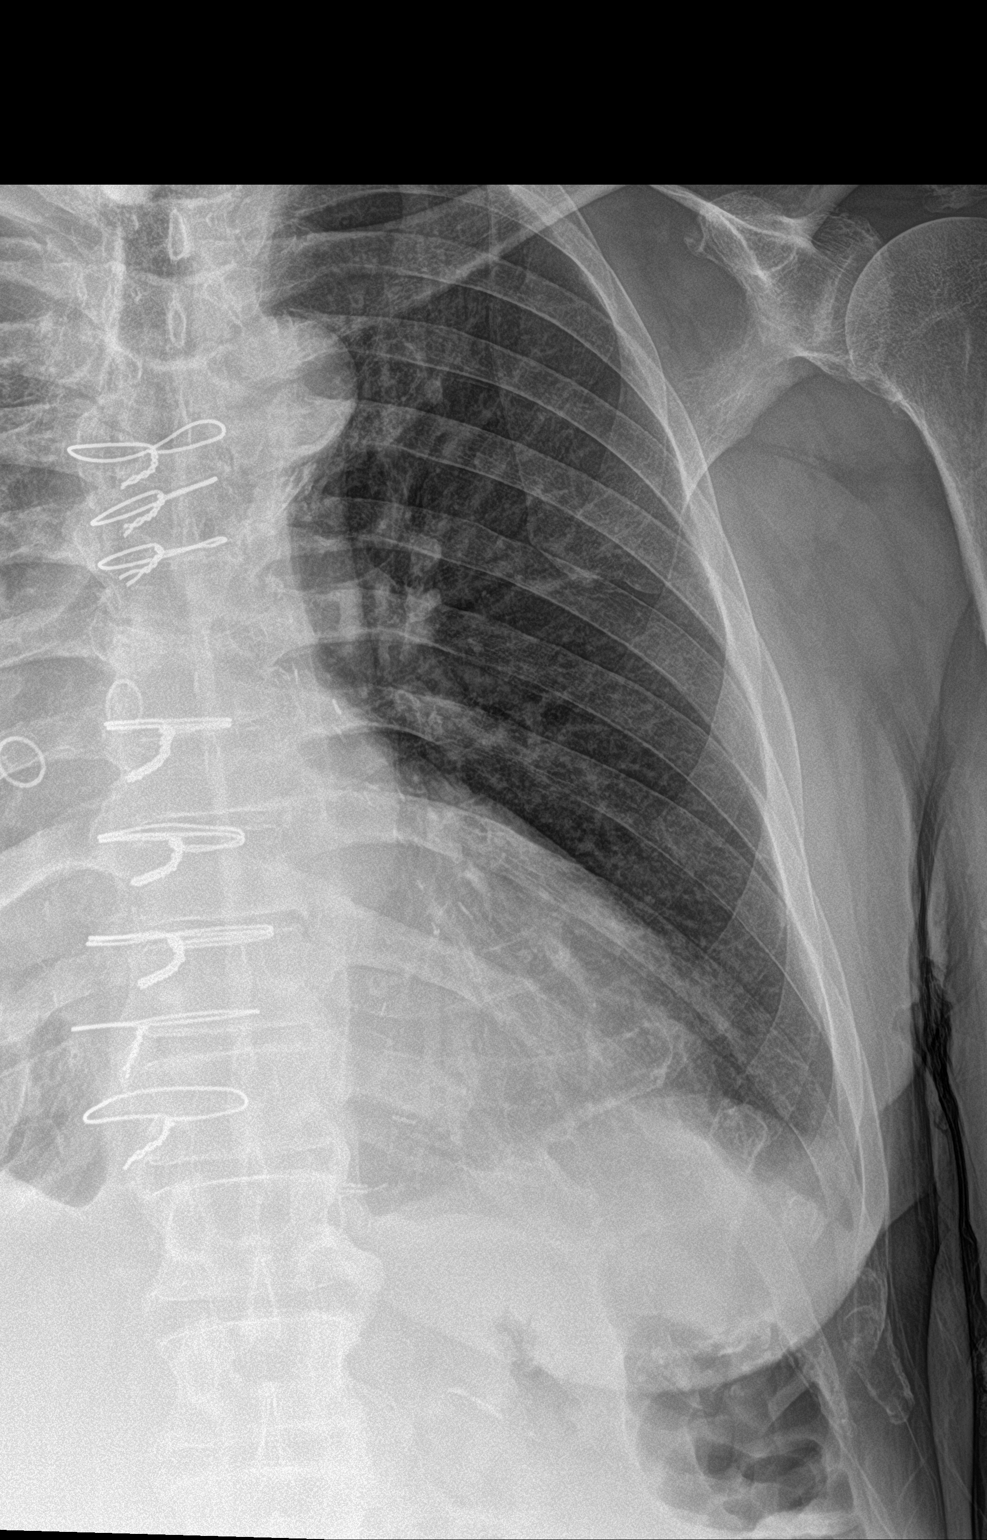

[rib ap (2 of 2)]
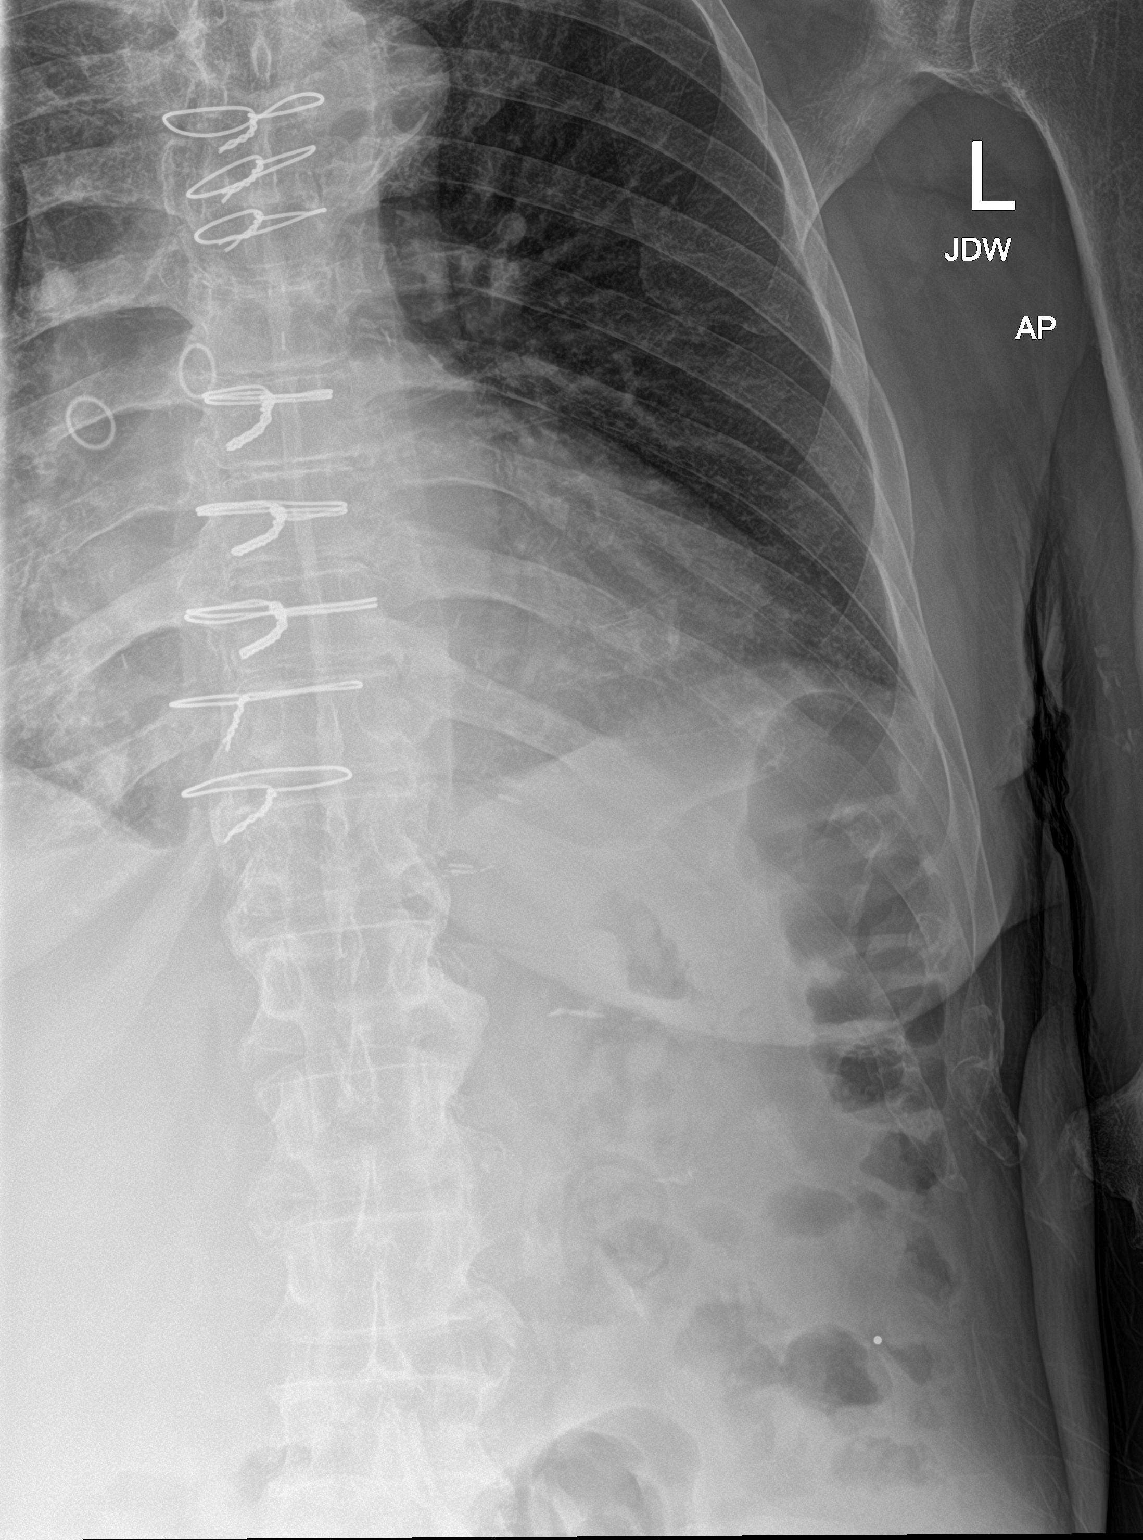

[rib ap obl]
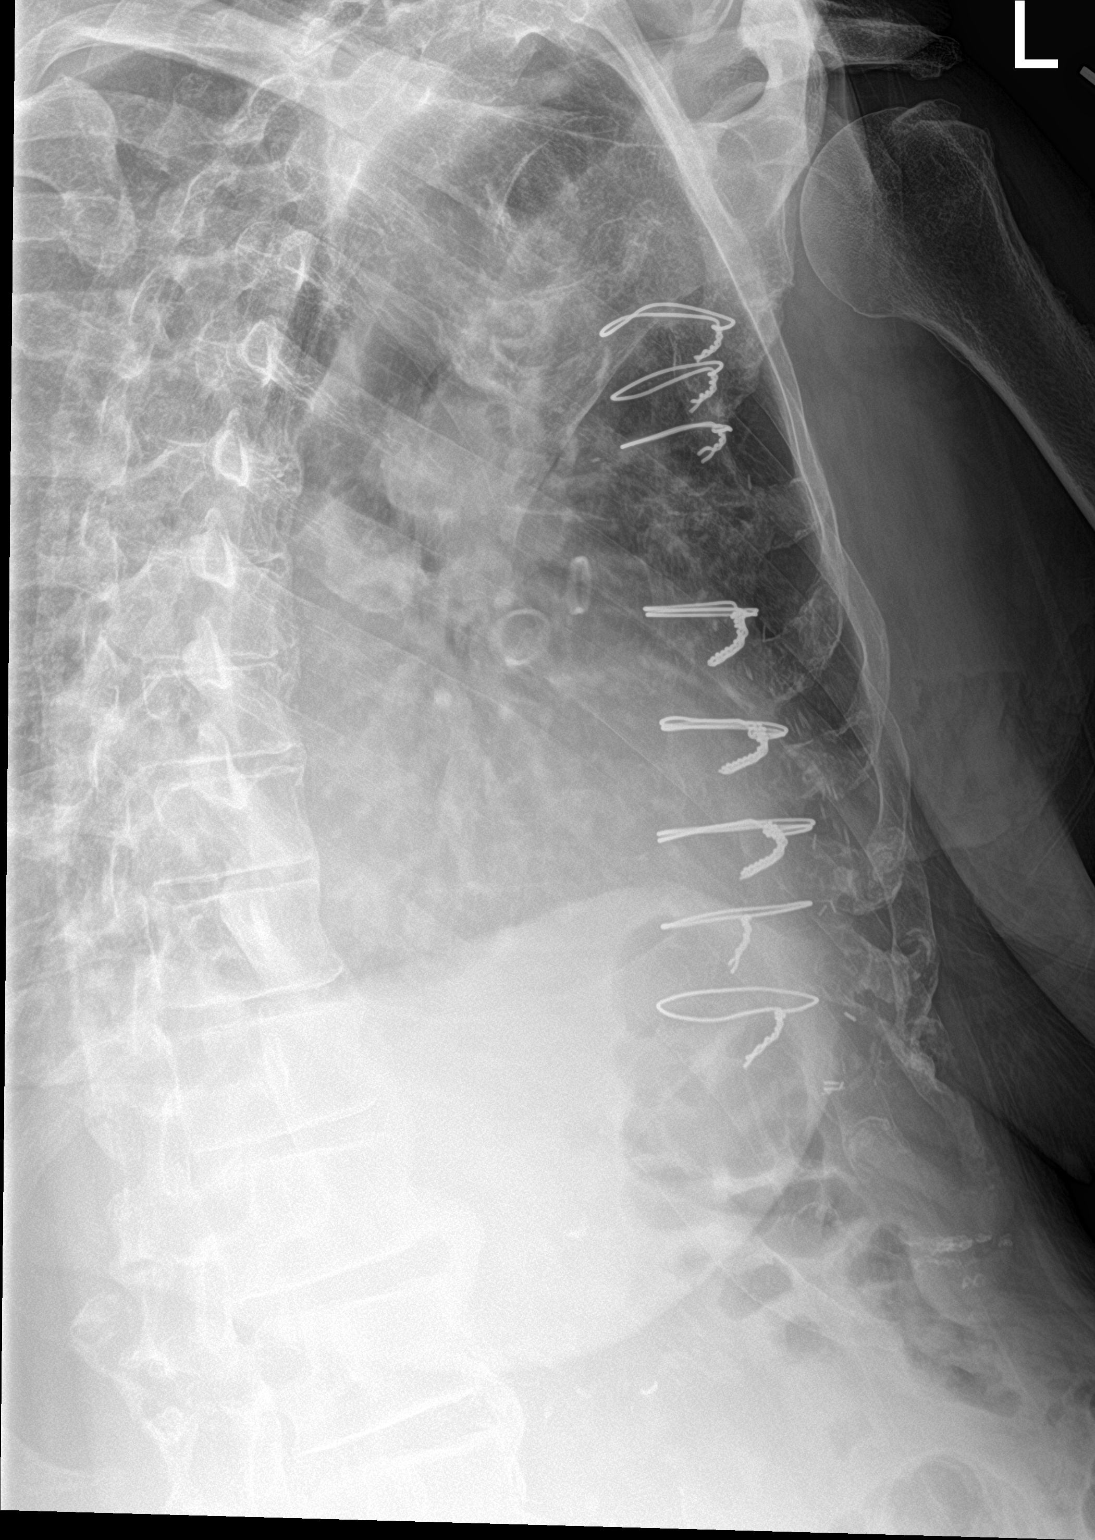

[3 of 3 positions shown; findings below may reference images not displayed]

FINDINGS: No fracture or other bone lesions are seen involving the ribs. Post
sternotomy changes. Probable vascular calcifications in the left
upper quadrant.
IMPRESSION: Negative.

## 2020-12-16 IMAGING — DX DG FOOT COMPLETE 3+V*R*
3 series · 3 of 3 positions shown · non-contrast
Comparison: 09/01/2011

CLINICAL DATA: 72 y/o male with chronic RIGHT foot pain x1 month.
NKI. Hx DM. It appears that on 09-27-18 Dr. Vivanco debrided ulcer
between right 4th and 5th toes. pt describes pains at great toe and
4th and 5th toes. Able to ambulate with pain.

EXAM:
RIGHT FOOT COMPLETE - 3+ VIEW

[foot ap]
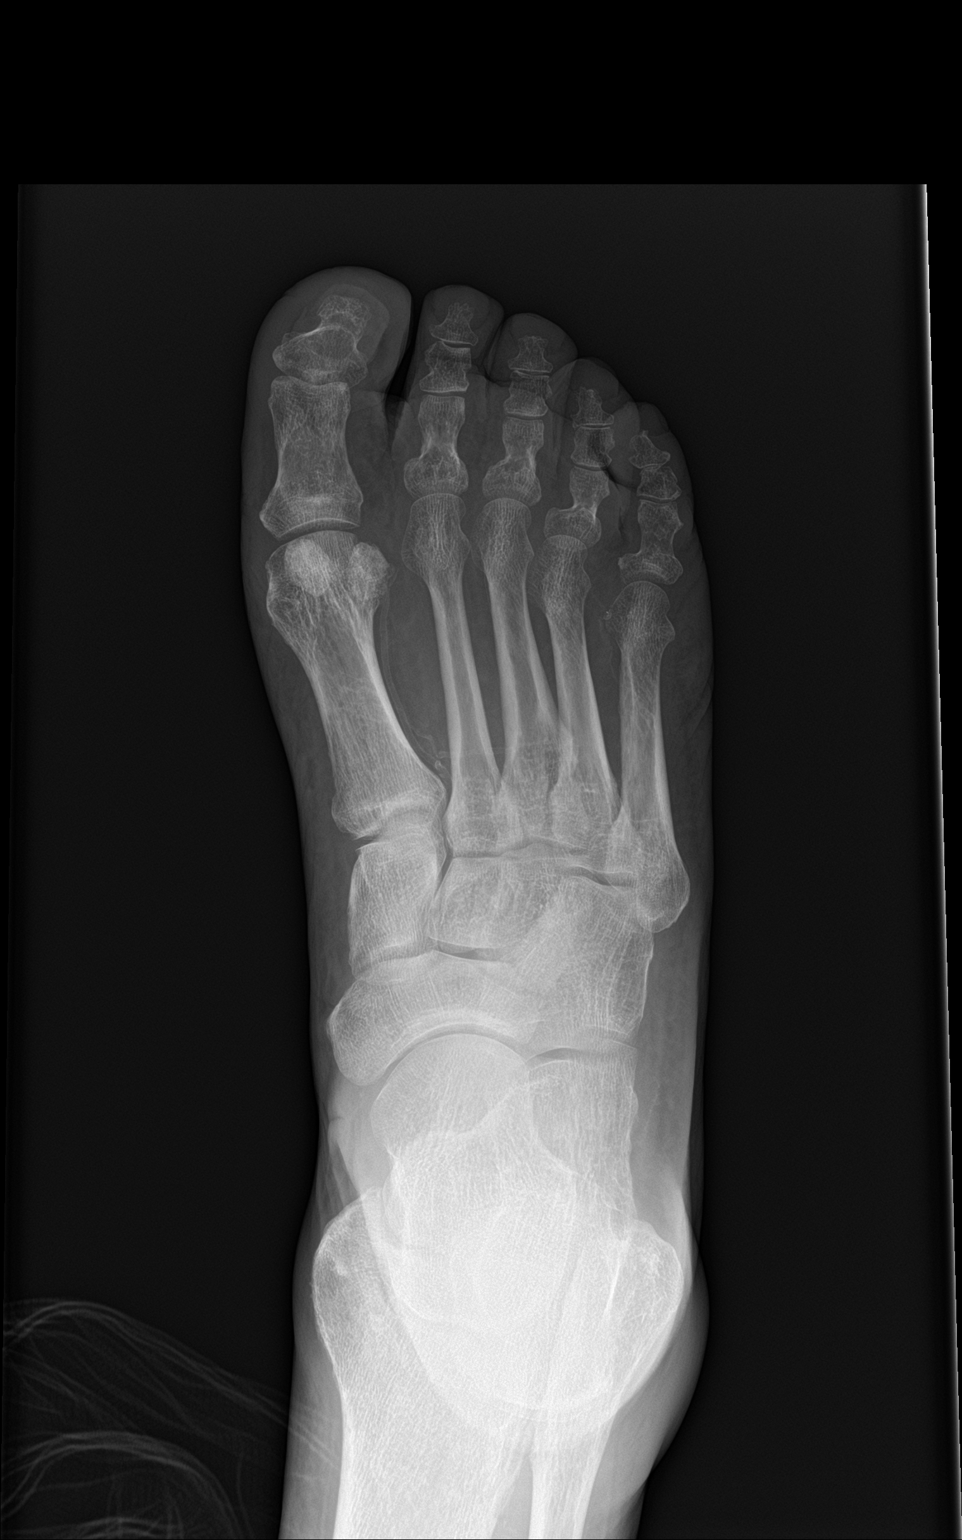

[foot obl]
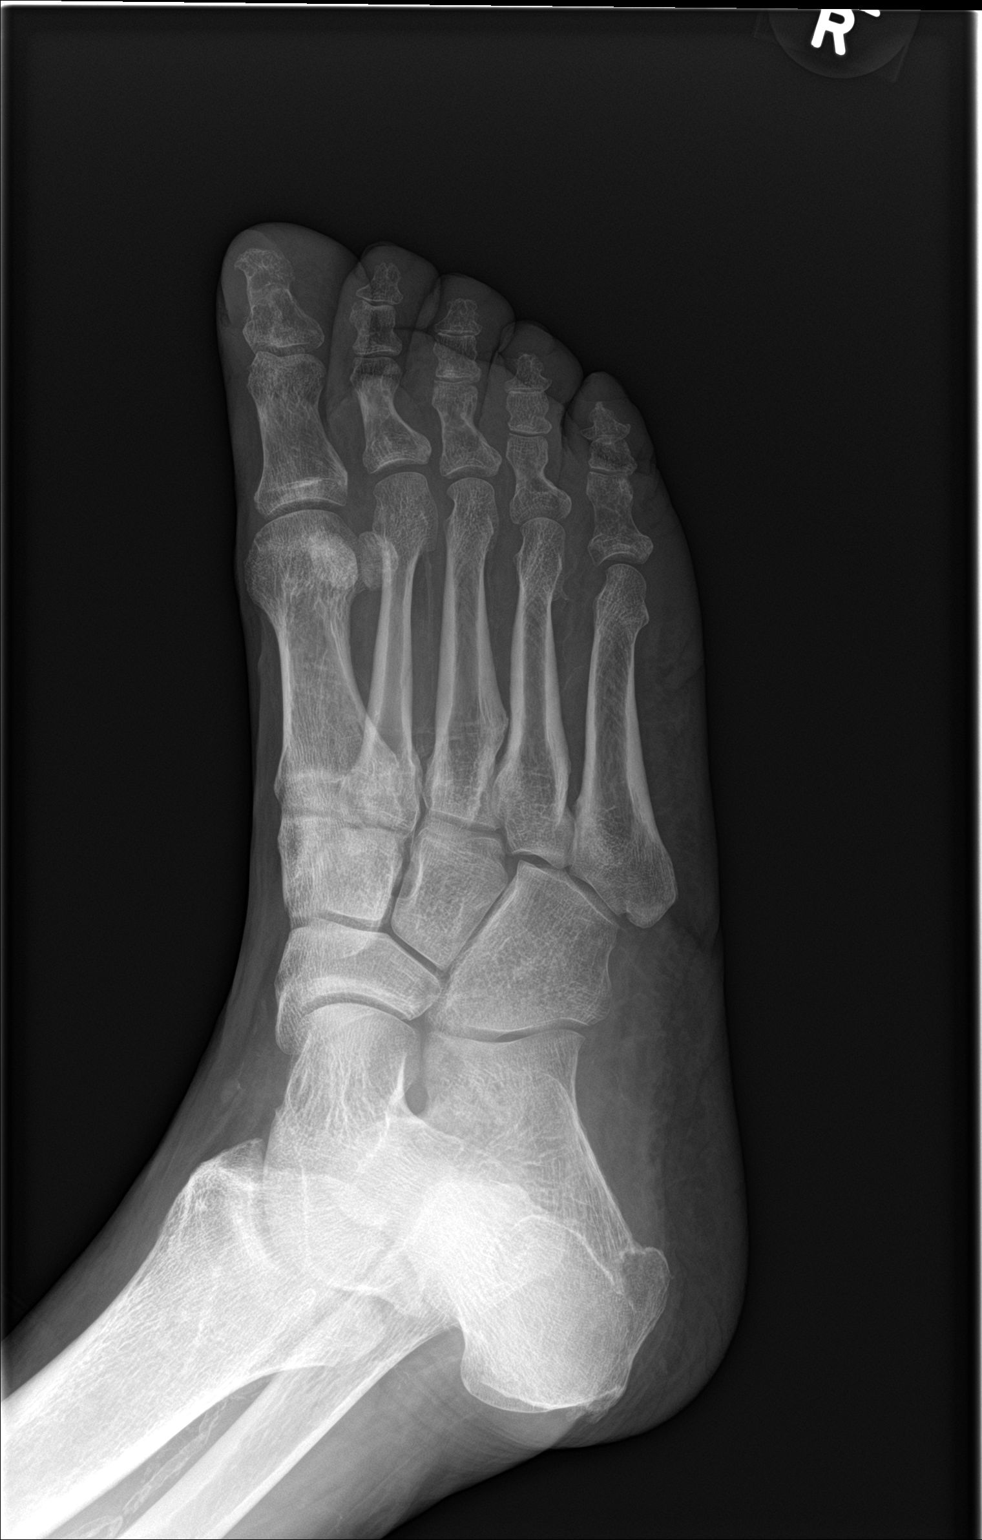

[foot lat]
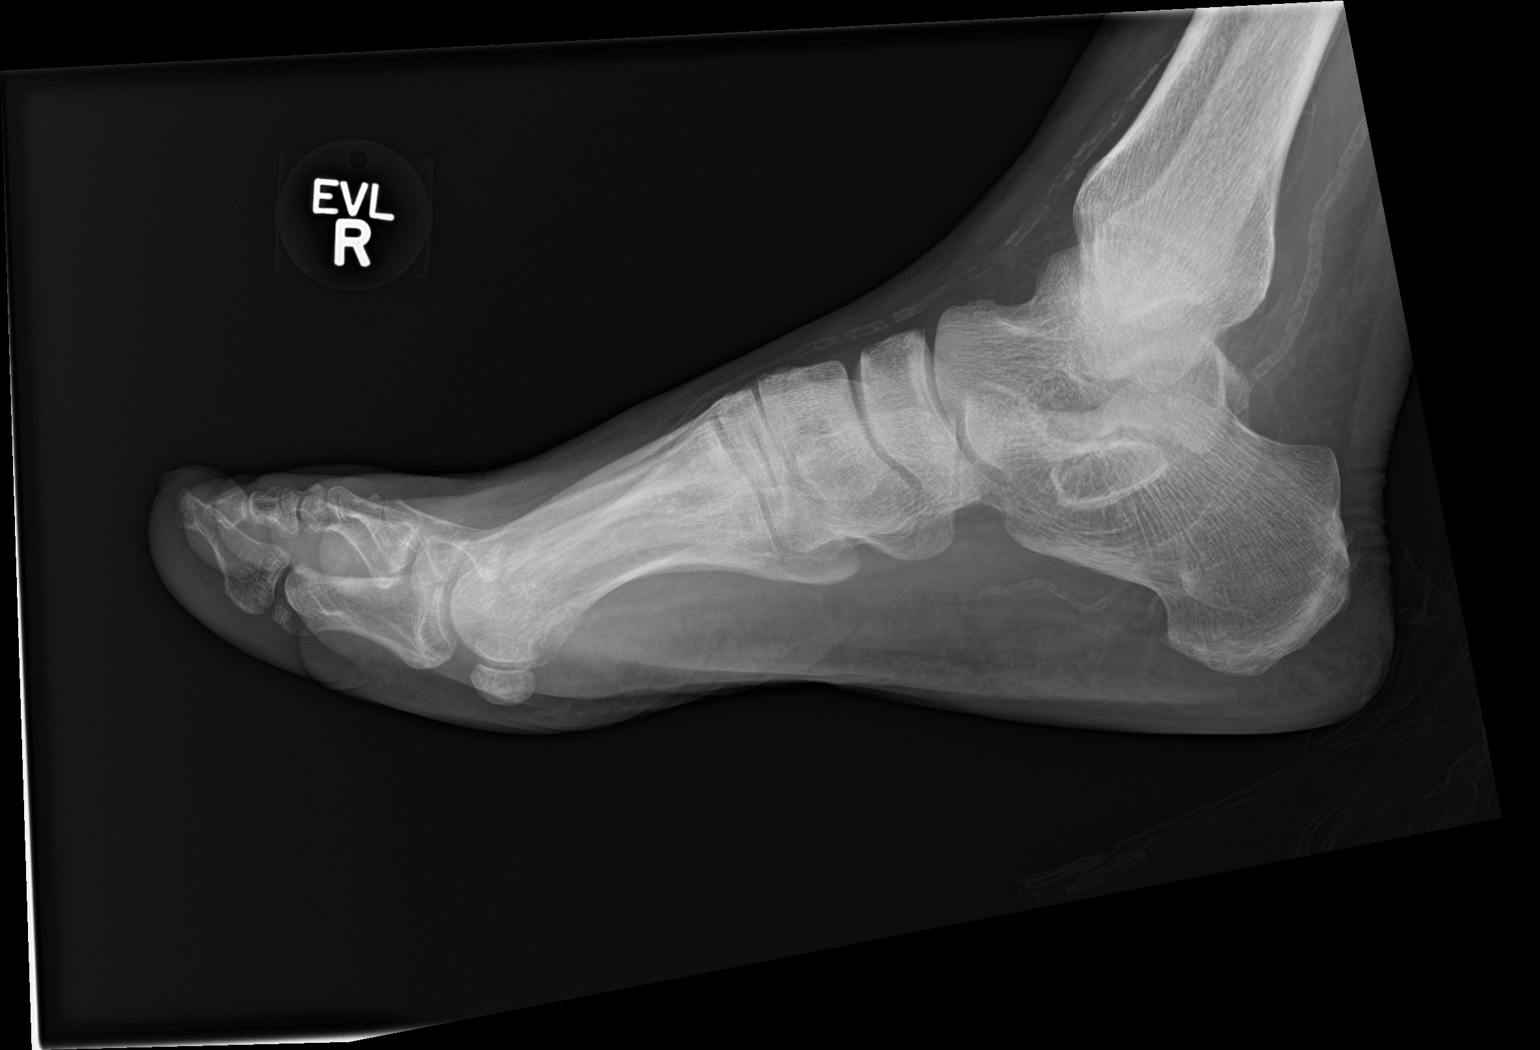

[3 of 3 positions shown; findings below may reference images not displayed]

FINDINGS: No fracture or bone lesion. No areas of bone resorption to suggest
osteomyelitis.

Joints are normally aligned.

No soft tissue air. There are arterial vascular calcifications. Soft
tissues are otherwise unremarkable.
IMPRESSION: 1. No fracture, dislocation or evidence of osteomyelitis.
